# Patient Record
Sex: Male | Born: 1950 | Race: Black or African American | Hispanic: No | Marital: Married | Smoking: Former smoker
Health system: Southern US, Community
[De-identification: ages and names within clinical notes are randomized; demographics above are authoritative.]

## PROBLEM LIST (undated history)

## (undated) DIAGNOSIS — I2510 Atherosclerotic heart disease of native coronary artery without angina pectoris: Secondary | ICD-10-CM

## (undated) DIAGNOSIS — K449 Diaphragmatic hernia without obstruction or gangrene: Secondary | ICD-10-CM

## (undated) DIAGNOSIS — R05 Cough: Secondary | ICD-10-CM

## (undated) DIAGNOSIS — Z86010 Personal history of colonic polyps: Secondary | ICD-10-CM

## (undated) DIAGNOSIS — I428 Other cardiomyopathies: Secondary | ICD-10-CM

## (undated) DIAGNOSIS — I5022 Chronic systolic (congestive) heart failure: Secondary | ICD-10-CM

## (undated) DIAGNOSIS — M1610 Unilateral primary osteoarthritis, unspecified hip: Secondary | ICD-10-CM

## (undated) DIAGNOSIS — R059 Cough, unspecified: Secondary | ICD-10-CM

## (undated) DIAGNOSIS — J189 Pneumonia, unspecified organism: Secondary | ICD-10-CM

## (undated) DIAGNOSIS — M1990 Unspecified osteoarthritis, unspecified site: Secondary | ICD-10-CM

## (undated) HISTORY — DX: Cough, unspecified: R05.9

## (undated) HISTORY — DX: Other cardiomyopathies: I42.8

## (undated) HISTORY — DX: Atherosclerotic heart disease of native coronary artery without angina pectoris: I25.10

## (undated) HISTORY — DX: Diaphragmatic hernia without obstruction or gangrene: K44.9

## (undated) HISTORY — DX: Unilateral primary osteoarthritis, unspecified hip: M16.10

## (undated) HISTORY — DX: Cough: R05

## (undated) HISTORY — DX: Personal history of colonic polyps: Z86.010

## (undated) HISTORY — DX: Unspecified osteoarthritis, unspecified site: M19.90

## (undated) HISTORY — DX: Chronic systolic (congestive) heart failure: I50.22

---

## 2000-05-02 HISTORY — PX: TOTAL HIP ARTHROPLASTY: SHX124

## 2000-09-19 ENCOUNTER — Encounter: Payer: Self-pay | Admitting: Internal Medicine

## 2001-04-01 ENCOUNTER — Encounter: Payer: Self-pay | Admitting: Emergency Medicine

## 2001-04-01 ENCOUNTER — Emergency Department (HOSPITAL_COMMUNITY): Admission: EM | Admit: 2001-04-01 | Discharge: 2001-04-02 | Payer: Self-pay | Admitting: Emergency Medicine

## 2001-04-21 ENCOUNTER — Encounter: Payer: Self-pay | Admitting: Emergency Medicine

## 2001-04-21 ENCOUNTER — Emergency Department (HOSPITAL_COMMUNITY): Admission: EM | Admit: 2001-04-21 | Discharge: 2001-04-22 | Payer: Self-pay | Admitting: Emergency Medicine

## 2002-11-21 ENCOUNTER — Inpatient Hospital Stay (HOSPITAL_COMMUNITY): Admission: EM | Admit: 2002-11-21 | Discharge: 2002-11-23 | Payer: Self-pay | Admitting: Emergency Medicine

## 2002-11-21 ENCOUNTER — Encounter: Payer: Self-pay | Admitting: Emergency Medicine

## 2006-06-10 ENCOUNTER — Ambulatory Visit: Payer: Self-pay | Admitting: Emergency Medicine

## 2006-06-10 ENCOUNTER — Inpatient Hospital Stay (HOSPITAL_COMMUNITY): Admission: EM | Admit: 2006-06-10 | Discharge: 2006-06-15 | Payer: Self-pay | Admitting: Emergency Medicine

## 2006-06-10 ENCOUNTER — Ambulatory Visit: Payer: Self-pay | Admitting: Internal Medicine

## 2006-07-11 ENCOUNTER — Ambulatory Visit: Payer: Self-pay | Admitting: Internal Medicine

## 2006-07-11 ENCOUNTER — Encounter (INDEPENDENT_AMBULATORY_CARE_PROVIDER_SITE_OTHER): Payer: Self-pay | Admitting: Dermatology

## 2006-07-11 DIAGNOSIS — M1990 Unspecified osteoarthritis, unspecified site: Secondary | ICD-10-CM | POA: Insufficient documentation

## 2006-07-11 DIAGNOSIS — M1610 Unilateral primary osteoarthritis, unspecified hip: Secondary | ICD-10-CM | POA: Insufficient documentation

## 2006-07-11 DIAGNOSIS — R05 Cough: Secondary | ICD-10-CM

## 2006-07-11 DIAGNOSIS — K449 Diaphragmatic hernia without obstruction or gangrene: Secondary | ICD-10-CM | POA: Insufficient documentation

## 2006-07-25 ENCOUNTER — Ambulatory Visit: Payer: Self-pay | Admitting: Internal Medicine

## 2010-09-17 NOTE — Discharge Summary (Signed)
NAMETILDON, Marvin Chandler              ACCOUNT NO.:  [id number]   MEDICAL RECORD NO.:  [id number]          PATIENT TYPE:  INP   LOCATION:  5032                         FACILITY:  [facility]   PHYSICIAN:  Marvin Chandler, M.D.     DATE OF BIRTH:  05-08-1950   DATE OF ADMISSION:  06/10/2006  DATE OF DISCHARGE:  06/15/2006                               DISCHARGE SUMMARY   ATTENDING PHYSICIAN:  Marvin Chandler, M.D.   DISCHARGE DIAGNOSES:  1. Status asthmaticus requiring BiPAP.  2. Asthma versus chronic bronchitis.  3. Former tobacco abuse.  4. Steroid-induced hyperglycemia.  5. Steroid-induced leukocytosis.   DISCHARGE MEDICATIONS:  1. Advair 250/50 one puff b.i.d.  2. Prednisone taper.  3. Albuterol inhaler 1-2 puffs q.6 h p.r.n.  4. Singulair 10 mg p.o. daily.  5. Omeprazole 20 mg p.o. daily.  6. Atrovent inhaler 2 puffs 4 times a day.   CONDITION AT DISCHARGE:  Stable and much improved.   The patient was instructed to follow up in the outpatient clinic on a to-  be-determined date as there were no appointments available at the time  that he was discharged.  He will be called with a followup appointment  date and time.  The patient was instructed to test his peak flows every  day and that if it was less than 200, he was to call the outpatient  clinic for instructions.   PROCEDURES:  1. Chest x-ray on June 10, 2006, showed no acute abnormalities.  2. Chest x-ray on June 11, 2006, showed no evidence for acute      abnormality.   CONSULTANTS:  Leslye Peer, MD, critical care medicine    For admission H&P, please see the chart for full details. In summary,  Marvin Chandler is a 60 year old African-American man with a history of  asthma versus chronic bronchitis who presented with a complaint of  severe shortness of breath.  He has a 10-year history of asthma and  has been hospitalized on time for it but has never been intubated.  He  had been coughing for the past week but denied  any other symptoms; for  example, no runny nose, sneezing, myalgias or fevers.  He denied any  sick contacts.  He had been using albuterol metered dose inhaler daily  with increased use over the past week.  He was unable to lift heavy  objects at work for the past 1-2 days prior to admission secondary to  shortness of breath.  He denies any chest pain until after he received  an epinephrine nebulizer treatment in the emergency department, and his  breathing has been very labored for the past 9 hours.  No treatment so  far has helped him until he was placed on BiPAP in the emergency  department.  He had three albuterol nebulizer treatments, prednisone 60  mg x1, an extended albuterol nebulizer treatment, and a racemic  epinephrine nebulizer treatment, all of which had no effect to break his  feeling of shortness of breath.   PHYSICAL EXAMINATION:  Vital signs on admission showed temperature was  97.4, blood pressure 141/84,  pulse 102, respiration rate 28, and he was  saturating at 96% on room air.  Pertinent physical exam findings  included he was audibly wheezing using accessory muscles of respiration  and had conversational dyspnea.  He had adequate air movement with  bilateral inspiratory and expiratory wheezes with upper airway wheezes  as well.  His heart was tachycardiac but regular. Abdomen was tender on  the palpation diffusely.   LABORATORY DATA:  On admission, his sodium was 139, potassium 3.9,  chloride 102, bicarb 27, BUN 9, creatinine 1.01, glucose 146, anion gap  10, bilirubin 0.7, alkaline phosphatase  68, AST 26, ALT 21, protein  7.2, albumin 3.9, calcium 9.2.  UA showed small blood with red blood  cell 0-2, otherwise negative.  Urine drug screen was negative.  White  blood cell count 9.0, hemoglobin 14.4, platelets 274, ANC 8.3, and MCV  87.3.  Lactic acid was elevated at 7.8.  Cardiac markers were negative  on the first set.  ABG initially showed a pH of 7.384, pCO2 of  46, pO2  of 68 on 3 liters. Later an hour later, his pH was 7.321  pCO2 46.3, pO2  134, and that was on 15 liters   HOSPITAL COURSE:  #1.  STATUS ASTHMATICUS:  The patient carries a diagnosis of asthma  for the past 10 years and has had one prior hospitalization for what  appeared to be bronchitis. He is a former tobacco user, having quit  about 11 years ago.  He has not been diagnosed with COPD that he knows  of.  He was, however, taking Atrovent and albuterol at home.  The  patient presented with severe bronchospasm which was initially resistant  to bronchodilators and steroids.  He was transferred to the ICU in  anticipation of intubation for continued repiratory failure but improved  with use of BIPAP under the supervision of Dr. Delton Coombes.  He was treated  empirically with Avelox for presumed pneumonia as the inciting event for  his status asthmaticus.  As he improved, his steroids were weaned,  he  was weaned off of BIPAP to RA, and he was transitiioned to MDIs.  His  medical noncompliance was addressed and he was discharged on steroid  taper, as well as Advair and Singulair for chronic use, an albuterol  inhaler for emergency use, and empiric omeprazole for presumed  contributaion from GERD.  The patient was given an asthma action plan  and instructed that, if his daily peak flows dropped below 200, he  should go to the outpatient clinic or the ER if after hours.  Unfortunately, we were not able to obtain an appointment for the patient  prior to discharge, but he was put on a list to be called with an  appointment and was also given our phone number in case he was not  called in the next week or two.  The patient should probably have a STAT  BMET as well as a CBC to check for resolution of his steroid-induced  leukocytosis and hyperglycemia.  Blood cultures were drawn and were  negative x2 sets. An IgE level was obtained which was very high at 578, indicating that he possibly has an  allergic component of his asthma.  The patient should be referred to an allergy immunologist as an  outpatient as well to perhaps be started on an anti IgE antibody regimen  should his asthma deteriorate and become worse.  A urine culture was  negative. Because he was  tachycardiac after receiving the racemic  epinephrine, a TSH was checked, and it was normal at 0.727.  Cardiac  enzymes were cycled and were negative x3, and his tachycardia did  resolve once the epinephrine washed.   #2.  STEROID-INDUCED HYPERGLYCEMIA:  The patient had transient  hyperglycemia which resolved with cessation of high dose steroids.   #3.  STEROID-INDUCED LEUKOCYTOSIS:  The patient's white blood cell count  became elevated to 17.3 after high-dose intravenous steroids were  initiated, but began to normalize with steroid taper.  No infectious  sources were isolated; and he was discharged home without antibiotics.  In the outpatient setting, a white blood cell count should be obtained  to ensure that it has resolved once the patient is no longer taking  prednisone.  As stated above, his blood cultures were negative x2 sets,  his urine culture was negative, and there was no pneumonia on chest x-  ray.   DISCHARGE LABORATORY DATA AND VITAL SIGNS ON THE DAY OF DISCHARGE:  His  white blood cell count was 12.2, hemoglobin 13.3, hematocrit 40,  platelets 219. Sodium 137, potassium 3.4, chloride 107, bicarb 28,  glucose 89, BUN 13, creatinine 1, calcium 8.1. Temperature 97.3, pulse  77, blood pressure 141/90, and he was saturating 95% on room air.   Pending labs:  None.      Chauncey Reading, D.O.  Electronically Signed      Marvin Chandler, M.D.  Electronically Signed    EA/MEDQ  D:  07/25/2006  T:  07/25/2006  Job:  [id number]   cc:   Leslye Peer, MD

## 2010-09-17 NOTE — Discharge Summary (Signed)
NAME:  Marvin Chandler, Marvin Chandler                        ACCOUNT NO.:  [id number]   MEDICAL RECORD NO.:  [id number]                   PATIENT TYPE:  INP   LOCATION:  0358                                 FACILITY:  [facility]   PHYSICIAN:  Sherin Quarry, MD                   DATE OF BIRTH:  July 04, 1950   DATE OF ADMISSION:  11/21/2002  DATE OF DISCHARGE:                                 DISCHARGE SUMMARY   HISTORY OF PRESENT ILLNESS:  Marvin Chandler is a 60 year old gentleman with  a past history of tobacco abuse who presented to the emergency room on July  22nd with a 48-hour history of increased shortness of breath associated with  a nonproductive cough.  There was no associated chest pain.  He had been  trying to use a metered-dose inhaler as an outpatient with no particular  benefit.  He recognizes the association of his symptoms with cigarette abuse  and has cut down on smoking from two packs a day to 1/2 pack per day.  Chest  x-ray obtained in the emergency room was consistent with chronic obstructive  pulmonary disease but showed no acute infiltrates.  Deirdre Peer. Polite, M.D.  admitted the patient for inpatient management.  A physical exam at the time  of admission revealed blood pressure 151/84.  The pulse was initially 120,  respirations 24, temperature was 98.6.  HEENT exam was within normal limits.  The chest reveals expiratory wheezes which were diffuse.  There was a  prolongation of expiratory phase.  Cardiovascular exam revealed normal S1 &  S2 without murmurs, rubs, or gallops.  The abdomen was benign.  Neurologic  testing and examination of extremities was normal.  Initially, the peak flow  was 120 liters per minute, post therapy was 150 liters per minute.   LABORATORY STUDIES OBTAINED:  Included an arterial blood gas which showed a  PO2 of 59, PCO2 of 39, pH of 7.385.  BMET was notable for a glucose of 160.  White count was 16,800, hemoglobin was 15.1.  Electrolytes and renal  function were normal.  Sputum was obtained which showed a moderate number of  gram-negative rods.  A culture of this is pending.   HOSPITAL COURSE:  On admission, the patient was placed on an IV of Solu-  Medrol 125 mg IV x1 then 60 mg IV every 8 hours.  He was placed on  azithromycin 500 mg daily, nebulizer treatment with albuterol and Atrovent  was initiated every 6 hours and p.r.n., oxygen was administered per oxygen  protocol.  The patient's course was one of gradual improvement.  By July  24th although the patient continued to have some expiratory wheezing, he was  definitely improved and appeared to be a candidate for outpatient therapy.  Therefore, he was discharged at that time.   DISCHARGE DIAGNOSES:  1. Acute exacerbation of chronic bronchitis.  2. 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   DISCHARGE MEDICATIONS:  Will consist of prednisone 60 mg x2, 40 mg daily x3,  30 mg x3, 20 mg x3, 10 mg x3, and then stop.  The patient is instructed to  take Zithromax 500 mg daily for 5 additional days.  He is also given a  prescription for a Combivent inhaler to use with an Aerochamber three puffs  q.i.d. and p.r.n.  I encouraged him to  obtain a nicotine patch.  Both the patient and his wife were instructed to  discontinue cigarette smoking and to remove cigarettes from the house.  He  will follow up with [facility] in about 7 to 10 days.   CONDITION ON DISCHARGE:  Fair.                                               Sherin Quarry, MD    SY/MEDQ  D:  11/23/2002  T:  11/23/2002  Job:  [id number]

## 2011-09-14 ENCOUNTER — Ambulatory Visit (INDEPENDENT_AMBULATORY_CARE_PROVIDER_SITE_OTHER): Payer: BC Managed Care – PPO | Admitting: Physician Assistant

## 2011-09-14 ENCOUNTER — Ambulatory Visit: Payer: BC Managed Care – PPO

## 2011-09-14 VITALS — BP 113/72 | HR 83 | Temp 98.1°F | Resp 18 | Ht 70.0 in | Wt 273.4 lb

## 2011-09-14 DIAGNOSIS — J9801 Acute bronchospasm: Secondary | ICD-10-CM

## 2011-09-14 DIAGNOSIS — R05 Cough: Secondary | ICD-10-CM

## 2011-09-14 DIAGNOSIS — E669 Obesity, unspecified: Secondary | ICD-10-CM

## 2011-09-14 DIAGNOSIS — J45909 Unspecified asthma, uncomplicated: Secondary | ICD-10-CM

## 2011-09-14 LAB — POCT CBC
Granulocyte percent: 56.2 %G (ref 37–80)
HCT, POC: 47 % (ref 43.5–53.7)
Hemoglobin: 15 g/dL (ref 14.1–18.1)
Lymph, poc: 2.6 (ref 0.6–3.4)
MCH, POC: 28.2 pg (ref 27–31.2)
MCHC: 31.9 g/dL (ref 31.8–35.4)
MCV: 88.3 fL (ref 80–97)
MID (cbc): 0.6 (ref 0–0.9)
MPV: 10.4 fL (ref 0–99.8)
POC Granulocyte: 4.2 (ref 2–6.9)
POC LYMPH PERCENT: 35.4 %L (ref 10–50)
POC MID %: 8.4 %M (ref 0–12)
Platelet Count, POC: 257 10*3/uL (ref 142–424)
RBC: 5.32 M/uL (ref 4.69–6.13)
RDW, POC: 13.6 %
WBC: 7.4 10*3/uL (ref 4.6–10.2)

## 2011-09-14 MED ORDER — ALBUTEROL SULFATE HFA 108 (90 BASE) MCG/ACT IN AERS: 2.0000 | INHALATION_SPRAY | Freq: Four times a day (QID) | RESPIRATORY_TRACT | Status: DC | PRN

## 2011-09-14 MED ORDER — METHYLPREDNISOLONE ACETATE 80 MG/ML IJ SUSP
80.0000 mg | Freq: Once | INTRAMUSCULAR | Status: AC
  Administered 2011-09-14: 80 mg via INTRAMUSCULAR

## 2011-09-14 MED ORDER — ALBUTEROL SULFATE (2.5 MG/3ML) 0.083% IN NEBU: 2.5000 mg | INHALATION_SOLUTION | Freq: Four times a day (QID) | RESPIRATORY_TRACT | Status: DC | PRN

## 2011-09-14 MED ORDER — FLUTICASONE-SALMETEROL 250-50 MCG/DOSE IN AEPB: 1.0000 | INHALATION_SPRAY | Freq: Two times a day (BID) | RESPIRATORY_TRACT | Status: DC

## 2011-09-14 MED ORDER — AZITHROMYCIN 250 MG PO TABS: ORAL_TABLET | ORAL | Status: AC

## 2011-09-14 MED ORDER — ALBUTEROL SULFATE (2.5 MG/3ML) 0.083% IN NEBU
2.5000 mg | INHALATION_SOLUTION | Freq: Once | RESPIRATORY_TRACT | Status: AC
  Administered 2011-09-14: 2.5 mg via RESPIRATORY_TRACT

## 2011-09-14 MED ORDER — IPRATROPIUM BROMIDE 0.02 % IN SOLN
0.5000 mg | Freq: Once | RESPIRATORY_TRACT | Status: AC
  Administered 2011-09-14: 0.5 mg via RESPIRATORY_TRACT

## 2011-09-14 NOTE — Patient Instructions (Signed)
Fluids, rest.  Recheck in 48 hrs if not improving, sooner if worse.

## 2011-09-14 NOTE — Progress Notes (Signed)
  Subjective:    Patient ID: Marvin Chandler, male    DOB: 1950-11-10, 61 y.o.   MRN: [id number]  HPI 61 yo AAM new to our practice presents with an asthma exacerbation X 3 days.  He is out of his inhaler and nebulizer solution.  He denies any previous diagnosis of COPD or emphysema. He denies any health problems.  He states he wheezes fairly regularly, but it worsened 3 days ago.   Review of Systems  All other systems reviewed and are negative.       Objective:   Physical Exam  Nursing note and vitals reviewed. Constitutional: He is oriented to person, place, and time. He appears well-developed and well-nourished.  HENT:  Head: Normocephalic and atraumatic.  Right Ear: External ear normal.  Left Ear: External ear normal.  Nose: Nose normal.  Mouth/Throat: Oropharynx is clear and moist. No oropharyngeal exudate.  Neck: Normal range of motion. Neck supple. No JVD present.  Cardiovascular: Normal rate, regular rhythm and normal heart sounds.   Pulmonary/Chest: Effort normal and breath sounds normal.  Lymphadenopathy:    He has no cervical adenopathy.  Neurological: He is alert and oriented to person, place, and time.  Skin: Skin is warm and dry.  Psychiatric: He has a normal mood and affect. His behavior is normal.    Results for orders placed in visit on 09/14/11  POCT CBC      Component Value Range   WBC 7.4  4.6 - 10.2 (K/uL)   Lymph, poc 2.6  0.6 - 3.4    POC LYMPH PERCENT 35.4  10 - 50 (%L)   MID (cbc) 0.6  0 - 0.9    POC MID % 8.4  0 - 12 (%M)   POC Granulocyte 4.2  2 - 6.9    Granulocyte percent 56.2  37 - 80 (%G)   RBC 5.32  4.69 - 6.13 (M/uL)   Hemoglobin 15.0  14.1 - 18.1 (g/dL)   HCT, POC 29.5  62.1 - 53.7 (%)   MCV 88.3  80 - 97 (fL)   MCH, POC 28.2  27 - 31.2 (pg)   MCHC 31.9  31.8 - 35.4 (g/dL)   RDW, POC 30.8     Platelet Count, POC 257  142 - 424 (K/uL)   MPV 10.4  0 - 99.8 (fL)  GLUCOSE, POCT (MANUAL RESULT ENTRY)      Component Value Range   POC  Glucose 106     chest X-ray read by Dr. Milus Glazier as no definite infiltrate.     Assessment & Plan:   Asthmatic bronchitis-see rxs and patient instructions Bronchospasm Patient responded favorably to breathing treatment

## 2011-11-06 ENCOUNTER — Ambulatory Visit (INDEPENDENT_AMBULATORY_CARE_PROVIDER_SITE_OTHER): Payer: BC Managed Care – PPO | Admitting: Emergency Medicine

## 2011-11-06 ENCOUNTER — Ambulatory Visit: Payer: BC Managed Care – PPO

## 2011-11-06 VITALS — BP 132/85 | HR 68 | Temp 98.0°F | Resp 17 | Ht 70.5 in | Wt 223.0 lb

## 2011-11-06 DIAGNOSIS — IMO0002 Reserved for concepts with insufficient information to code with codable children: Secondary | ICD-10-CM

## 2011-11-06 DIAGNOSIS — R079 Chest pain, unspecified: Secondary | ICD-10-CM

## 2011-11-06 DIAGNOSIS — S239XXA Sprain of unspecified parts of thorax, initial encounter: Secondary | ICD-10-CM

## 2011-11-06 DIAGNOSIS — R9431 Abnormal electrocardiogram [ECG] [EKG]: Secondary | ICD-10-CM

## 2011-11-06 DIAGNOSIS — R0789 Other chest pain: Secondary | ICD-10-CM

## 2011-11-06 DIAGNOSIS — M083 Juvenile rheumatoid polyarthritis (seronegative): Secondary | ICD-10-CM

## 2011-11-06 LAB — POCT CBC
Lymph, poc: 2.3 (ref 0.6–3.4)
MCH, POC: 28.7 pg (ref 27–31.2)
MCHC: 31.2 g/dL — AB (ref 31.8–35.4)
MID (cbc): 0.3 (ref 0–0.9)
MPV: 10.2 fL (ref 0–99.8)
POC MID %: 5.3 %M (ref 0–12)
Platelet Count, POC: 237 10*3/uL (ref 142–424)
WBC: 6.4 10*3/uL (ref 4.6–10.2)

## 2011-11-06 LAB — POCT URINALYSIS DIPSTICK
Glucose, UA: NEGATIVE
Leukocytes, UA: NEGATIVE
Nitrite, UA: NEGATIVE
Spec Grav, UA: >=1.03
Urobilinogen, UA: 0.2

## 2011-11-06 LAB — POCT UA - MICROSCOPIC ONLY: Yeast, UA: NEGATIVE

## 2011-11-06 MED ORDER — ALBUTEROL SULFATE HFA 108 (90 BASE) MCG/ACT IN AERS: 2.0000 | INHALATION_SPRAY | Freq: Four times a day (QID) | RESPIRATORY_TRACT | Status: DC | PRN

## 2011-11-06 MED ORDER — FLUTICASONE-SALMETEROL 250-50 MCG/DOSE IN AEPB: 1.0000 | INHALATION_SPRAY | Freq: Two times a day (BID) | RESPIRATORY_TRACT | Status: DC

## 2011-11-06 MED ORDER — MELOXICAM 15 MG PO TABS: ORAL_TABLET | ORAL | Status: DC

## 2011-11-06 MED ORDER — CYCLOBENZAPRINE HCL 10 MG PO TABS: ORAL_TABLET | ORAL | Status: DC

## 2011-11-06 NOTE — Progress Notes (Signed)
Subjective:    Patient ID: Marvin Chandler, male    DOB: [birth year], 61 y.o.   MRN: [id number]  HPI shunt enters with a three-day history of pain in the left side of his chest. 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 He does not remember specific injury to that area. He has not felt particularly short of breath. He also misplaces albuterol inhaler so he called the patient's Symbicort inhaler and has been using this instead. He does have an Advair inhaler at home.    Review of Systems he does have a history of asthma is on Advair and albuterol.     Objective:   Physical Exam  Constitutional: He appears well-developed and well-nourished.  HENT:  Right Ear: External ear normal.  Left Ear: External ear normal.  Eyes: Pupils are equal, round, and reactive to light.  Neck: No thyromegaly present.  Cardiovascular: Normal rate and regular rhythm.   Pulmonary/Chest: Effort normal and breath sounds normal.       There are diminished breath sounds on the left. There are some dry crackles on the left  Abdominal: Soft. He exhibits no distension. There is no tenderness. There is no rebound.   [facility] reading (PRIMARY) by  Dr.Albertina Leise no pneumothorax was seen. There is probable old granulomatous disease.  Results for orders placed in visit on 11/06/11  POCT CBC      Component Value Range   WBC 6.4  4.6 - 10.2 K/uL   Lymph, poc 2.3  0.6 - 3.4   POC LYMPH PERCENT 35.3  10 - 50 %L   MID (cbc) 0.3  0 - 0.9   POC MID % 5.3  0 - 12 %M   POC Granulocyte 3.8  2 - 6.9   Granulocyte percent 59.4  37 - 80 %G   RBC 4.57 (*) 4.69 - 6.13 M/uL   Hemoglobin 13.1 (*) 14.1 - 18.1 g/dL   HCT, POC 13.0 (*) 86.5 - 53.7 %   MCV 91.8  80 - 97 fL   MCH, POC 28.7  27 - 31.2 pg   MCHC 31.2 (*) 31.8 - 35.4 g/dL   RDW, POC 78.4     Platelet Count, POC 237  142 - 424 K/uL   MPV 10.2  0 - 99.8 fL  POCT URINALYSIS DIPSTICK      Component Value Range   Color, UA yellow     Clarity, UA clear     Glucose, UA neg     Bilirubin, UA neg     Ketones, UA neg     Spec Grav, UA >=1.030     Blood, UA trace     pH, UA 6.0     Protein, UA neg     Urobilinogen, UA 0.2     Nitrite, UA neg     Leukocytes, UA Negative    POCT UA - MICROSCOPIC ONLY      Component Value Range   WBC, Ur, HPF, POC 0-2     RBC, urine, microscopic 3-5     Bacteria, U Microscopic trace     Mucus, UA trace     Epithelial cells, urine per micros 1-2     Crystals, Ur, HPF, POC neg     Casts, Ur, LPF, POC neg     Yeast, UA neg     EKG qs V1 to V3      Assessment & Plan:  Patient has been using this and the  cord inhaler instead of his albuterol inhaler. He was given this by friends who is been using it instead. 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 He has no history of any episode of severe substernal discomfort. The pain he is describing today sounds musculoskeletal treat with Mobic 15 one a day. Have scheduled him for a full physical on Friday. We'll also set up an appointment for him to see a cardiologist about his abnormal EKG. His meds for albuterol and Advil were refilled. She will be given Flexeril to take at bedtime

## 2011-11-06 NOTE — Patient Instructions (Addendum)
Please call [phone] and make an appointment to see Dr. Merrilee Seashore Strain You have injured the muscles or tendons that attach to the upper part of your back behind your chest. This injury is called a thoracic strain, thoracic sprain, or mid-back strain.  CAUSES  The cause of thoracic strain varies. A less severe injury involves pulling a muscle or tendon without tearing it. A more severe injury involves tearing (rupturing) a muscle or tendon. With less severe injuries, there may be little loss of strength. Sometimes, there are breaks (fractures) in the bones to which the muscles are attached. These fractures are rare, unless there was a direct hit (trauma) or you have weak bones due to osteoporosis or age. Longstanding strains may be caused by overuse or improper form during certain movements. Obesity can also increase your risk for back injuries. Sudden strains may occur due to injury or not warming up properly before exercise. Often, there is no obvious cause for a thoracic strain. SYMPTOMS  The main symptom is pain, especially with movement, such as during exercise. DIAGNOSIS  Your caregiver can usually tell what is wrong by taking an X-ray and doing a physical exam. TREATMENT   Physical therapy may be helpful for recovery. Your caregiver can give you exercises to do or refer you to a physical therapist after your pain improves.   After your pain improves, strengthening and conditioning programs appropriate for your sport or occupation may be helpful.   Always warm up before physical activities or athletics. Stretching after physical activity may also help.   Certain over-the-counter medicines may also help. Ask your caregiver if there are medicines that would help you.  If this is your first thoracic strain injury, proper care and proper healing time before starting activities should prevent long-term problems. Torn ligaments and tendons require as long to heal as broken bones. Average  healing times may be only 1 week for a mild strain. For torn muscles and tendons, healing time may be up to 6 weeks to 2 months. HOME CARE INSTRUCTIONS   Apply ice to the injured area. Ice massages may also be used as directed.   Put ice in a plastic bag.   Place a towel between your skin and the bag.   Leave the ice on for 15 to 20 minutes, 3 to 4 times a day, for the first 2 days.   Only take over-the-counter or prescription medicines for pain, discomfort, or fever as directed by your caregiver.   Keep your appointments for physical therapy if this was prescribed.   Use wraps and back braces as instructed.  SEEK IMMEDIATE MEDICAL CARE IF:   You have an increase in bruising, swelling, or pain.   Your pain has not improved with medicines.   You develop new shortness of breath, chest pain, or fever.   Problems seem to be getting worse rather than better.  MAKE SURE YOU:   Understand these instructions.   Will watch your condition.   Will get help right away if you are not doing well or get worse.  Document Released: 07/09/2003 Document Revised: 04/07/2011 Document Reviewed: 06/04/2010 [facility] Patient Information 2012 [place], [place]. for a physical on a Friday.

## 2011-11-09 ENCOUNTER — Encounter: Payer: Self-pay | Admitting: Emergency Medicine

## 2011-12-14 ENCOUNTER — Encounter: Payer: Self-pay | Admitting: *Deleted

## 2011-12-16 ENCOUNTER — Encounter: Payer: Self-pay | Admitting: Cardiovascular Disease

## 2011-12-16 ENCOUNTER — Ambulatory Visit (INDEPENDENT_AMBULATORY_CARE_PROVIDER_SITE_OTHER): Payer: Self-pay | Admitting: Cardiovascular Disease

## 2011-12-16 VITALS — BP 152/88 | HR 67 | Ht 71.0 in | Wt 224.0 lb

## 2011-12-16 DIAGNOSIS — R0989 Other specified symptoms and signs involving the circulatory and respiratory systems: Secondary | ICD-10-CM

## 2011-12-16 DIAGNOSIS — R079 Chest pain, unspecified: Secondary | ICD-10-CM | POA: Insufficient documentation

## 2011-12-16 DIAGNOSIS — R0600 Dyspnea, unspecified: Secondary | ICD-10-CM

## 2011-12-16 NOTE — Patient Instructions (Signed)
Your physician recommends that you schedule a follow-up appointment in: 3-4 weeks.   Your physician has requested that you have an exercise tolerance test. For further information please visit [email or web address] Please also follow instruction sheet, as given.   Your physician has requested that you have an echocardiogram. Echocardiography is a painless test that uses sound waves to create images of your heart. It provides your doctor with information about the size and shape of your heart and how well your heart's chambers and valves are working. This procedure takes approximately one hour. There are no restrictions for this procedure.

## 2011-12-16 NOTE — Assessment & Plan Note (Signed)
Will arrange treadmill stress test to exclude ischemia.

## 2011-12-16 NOTE — Assessment & Plan Note (Signed)
Will arrange echo to assess LV function and wall motion with dyspnea and abnormal EKG. Will arrange treadmill stress test to exclude ischemia.

## 2011-12-16 NOTE — Progress Notes (Signed)
   History of Present Illness: 61 yo AAM with history of asthma, hiatal hernia but no HLD, HTN or DM and no prior cardiac disease who is here today as a new patient for evaluation of chest pain and an abnormal EKG. He was seen recently by Dr. Cleta Alberts and his EKG showed poor R wave progression through the precordial leads.   He tells me that he has been having bad pains in his upper back and this radiated to his chest. This did not hurt when he moved his arm. He has had minimal chest pain over the last month. He does notice shortness of breath when he exercise.   Primary Care Physician: Lesle Chris    Past Medical History  Diagnosis Date  . Asthma   . Cough   . Hiatal hernia   . Osteoarthritis   . Arthritis of hip     bilateral    Past Surgical History  Procedure Date  . Total hip arthroplasty 2002    bilateral    Current Outpatient Prescriptions  Medication Sig Dispense Refill  . albuterol (PROVENTIL HFA;VENTOLIN HFA) 108 (90 BASE) MCG/ACT inhaler Inhale 2 puffs into the lungs every 6 (six) hours as needed for wheezing.  1 Inhaler  11  . albuterol (PROVENTIL) (2.5 MG/3ML) 0.083% nebulizer solution Take 3 mLs (2.5 mg total) by nebulization every 6 (six) hours as needed.  75 mL  1  . cyclobenzaprine (FLEXERIL) 10 MG tablet Take one tablet at night as a muscle relaxant.  30 tablet  0  . Fluticasone-Salmeterol (ADVAIR DISKUS) 250-50 MCG/DOSE AEPB Inhale 1 puff into the lungs 2 (two) times daily.  60 each  11  . meloxicam (MOBIC) 15 MG tablet Take one tablet daily with food .  30 tablet  1    No Known Allergies  History   Social History  . Marital Status: Single    Spouse Name: N/A    Number of Children: 2  . Years of Education: N/A   Occupational History  . Softball coach [employer]    Social History Main Topics  . Smoking status: Former Smoker -- 1.0 packs/day for 7 years    Types: Cigarettes    Quit date: 11/05/1997  . Smokeless tobacco: Not on file  . Alcohol  Use: No  . Drug Use: No  . Sexually Active: Not on file   Other Topics Concern  . Not on file   Social History Narrative  . No narrative on file    Family History  Problem Relation Age of Onset  . Dementia Mother     Review of Systems:  As stated in the HPI and otherwise negative.   BP 152/88  Pulse 67  Ht 5\' 11"  (1.803 m)  Wt 224 lb (101.606 kg)  BMI 31.24 kg/m2  Physical Examination: General: Well developed, well nourished, NAD HEENT: OP clear, mucus membranes moist SKIN: warm, dry. No rashes. Neuro: No focal deficits Musculoskeletal: Muscle strength 5/5 all ext Psychiatric: Mood and affect normal Neck: No JVD, no carotid bruits, no thyromegaly, no lymphadenopathy. Lungs:Clear bilaterally, no wheezes, rhonci, crackles Cardiovascular: Regular rate and rhythm. No murmurs, gallops or rubs. Abdomen:Soft. Bowel sounds present. Non-tender.  Extremities: No lower extremity edema. Pulses are 2 + in the bilateral DP/PT.  EKG: Sinus rhythm with 1st degree AV block, poor R wave progression

## 2011-12-22 ENCOUNTER — Ambulatory Visit (HOSPITAL_COMMUNITY): Payer: BC Managed Care – PPO | Attending: Cardiology

## 2011-12-22 DIAGNOSIS — R079 Chest pain, unspecified: Secondary | ICD-10-CM | POA: Insufficient documentation

## 2011-12-22 DIAGNOSIS — R072 Precordial pain: Secondary | ICD-10-CM

## 2011-12-22 DIAGNOSIS — R0609 Other forms of dyspnea: Secondary | ICD-10-CM | POA: Insufficient documentation

## 2011-12-22 DIAGNOSIS — I059 Rheumatic mitral valve disease, unspecified: Secondary | ICD-10-CM | POA: Insufficient documentation

## 2011-12-22 DIAGNOSIS — I379 Nonrheumatic pulmonary valve disorder, unspecified: Secondary | ICD-10-CM | POA: Insufficient documentation

## 2011-12-22 DIAGNOSIS — I079 Rheumatic tricuspid valve disease, unspecified: Secondary | ICD-10-CM | POA: Insufficient documentation

## 2011-12-22 DIAGNOSIS — R0600 Dyspnea, unspecified: Secondary | ICD-10-CM

## 2011-12-22 DIAGNOSIS — R0989 Other specified symptoms and signs involving the circulatory and respiratory systems: Secondary | ICD-10-CM | POA: Insufficient documentation

## 2011-12-22 DIAGNOSIS — Z87891 Personal history of nicotine dependence: Secondary | ICD-10-CM | POA: Insufficient documentation

## 2011-12-22 NOTE — Progress Notes (Signed)
Echocardiogram performed.  

## 2012-01-06 ENCOUNTER — Encounter: Payer: Self-pay | Admitting: Nurse Practitioner

## 2012-01-06 ENCOUNTER — Ambulatory Visit (INDEPENDENT_AMBULATORY_CARE_PROVIDER_SITE_OTHER): Payer: BC Managed Care – PPO | Admitting: Nurse Practitioner

## 2012-01-06 VITALS — BP 134/75 | HR 78

## 2012-01-06 DIAGNOSIS — R0600 Dyspnea, unspecified: Secondary | ICD-10-CM

## 2012-01-06 DIAGNOSIS — R079 Chest pain, unspecified: Secondary | ICD-10-CM

## 2012-01-06 DIAGNOSIS — R9431 Abnormal electrocardiogram [ECG] [EKG]: Secondary | ICD-10-CM

## 2012-01-06 NOTE — Patient Instructions (Addendum)
We need to arrange for a chemical stress test (lexiscan)  Stay on your current medicines  Call the [facility] office at [phone] if you have any questions, problems or concerns.

## 2012-01-06 NOTE — Procedures (Signed)
Exercise Treadmill Test  Pre-Exercise Testing Evaluation Rhythm: normal sinus  Rate: 78   PR:  .20 QRS:  .08  QT:  .36 QTc: .41     Test  Exercise Tolerance Test Ordering MD: Melene Muller, MD  Interpreting MD: Norma Fredrickson , NP  Unique Test No: 1  Treadmill:  1  Indication for ETT: chest pain - rule out ischemia  Contraindication to ETT: No   Stress Modality: exercise - treadmill  Cardiac Imaging Performed: non   Protocol: standard Bruce - maximal  Max BP:  166/63  Max MPHR (bpm):  157 85% MPR (bpm):  133  MPHR obtained (bpm):  122 % MPHR obtained:  77%  Reached 85% MPHR (min:sec):  n/a Total Exercise Time (min-sec):  4:50  Workload in METS:  6.7 Borg Scale: 15  Reason ETT Terminated:  patient's desire to stop    ST Segment Analysis At Rest: normal ST segments - no evidence of significant ST depression With Exercise: no evidence of significant ST depression  Other Information Arrhythmia:  Yes Angina during ETT:  absent (0) Quality of ETT:  non-diagnostic  ETT Interpretation:  Non-diagnostic  Comments: Patient presents today for routine GXT. Seen for Dr. Clifton James. He has no known cardiac disease. Has had an abnormal EKG with poor R wave progression. He has had some chest pain over the past month as well.  Today, he exercised on the standard Bruce protocol. He was only able to exercise for a total of 4:50. The test was stopped due to bilateral hip pain. Target heart rate was not achieved. No chest pain. Resting EKG is abnormal with septal Q waves. No ST depression during the short time he walked. Occasional PVC noted. BP response was adequate.   Recommendations: Will arrange for Lexiscan Myoview in light of patient's inability to reach his target heart rate. He is limited by bilateral hip pain. Further disposition to follow. Patient is agreeable to this plan and will call if any problems develop in the interim.

## 2012-01-16 ENCOUNTER — Ambulatory Visit (HOSPITAL_COMMUNITY): Payer: BC Managed Care – PPO | Attending: Internal Medicine | Admitting: Radiology

## 2012-01-16 VITALS — BP 134/74 | Ht 71.0 in | Wt 222.0 lb

## 2012-01-16 DIAGNOSIS — Z87891 Personal history of nicotine dependence: Secondary | ICD-10-CM | POA: Insufficient documentation

## 2012-01-16 DIAGNOSIS — R9431 Abnormal electrocardiogram [ECG] [EKG]: Secondary | ICD-10-CM

## 2012-01-16 DIAGNOSIS — R079 Chest pain, unspecified: Secondary | ICD-10-CM | POA: Insufficient documentation

## 2012-01-16 DIAGNOSIS — R0609 Other forms of dyspnea: Secondary | ICD-10-CM | POA: Insufficient documentation

## 2012-01-16 DIAGNOSIS — R0600 Dyspnea, unspecified: Secondary | ICD-10-CM

## 2012-01-16 DIAGNOSIS — R0989 Other specified symptoms and signs involving the circulatory and respiratory systems: Secondary | ICD-10-CM | POA: Insufficient documentation

## 2012-01-16 DIAGNOSIS — R0602 Shortness of breath: Secondary | ICD-10-CM | POA: Insufficient documentation

## 2012-01-16 MED ORDER — TECHNETIUM TC 99M SESTAMIBI GENERIC - CARDIOLITE
11.0000 | Freq: Once | INTRAVENOUS | Status: AC | PRN
  Administered 2012-01-16: 11 via INTRAVENOUS

## 2012-01-16 MED ORDER — REGADENOSON 0.4 MG/5ML IV SOLN
0.4000 mg | Freq: Once | INTRAVENOUS | Status: AC
  Administered 2012-01-16: 0.4 mg via INTRAVENOUS

## 2012-01-16 MED ORDER — TECHNETIUM TC 99M SESTAMIBI GENERIC - CARDIOLITE
33.0000 | Freq: Once | INTRAVENOUS | Status: AC | PRN
  Administered 2012-01-16: 33 via INTRAVENOUS

## 2012-01-16 NOTE — Progress Notes (Signed)
[facility] SITE 3 NUCLEAR MED [address] [place] [address] [phone]  Cardiology Nuclear Med Study  Marvin Chandler is a 61 y.o. male     MRN : [id number]     DOB: March 04, 1951  Procedure Date: 01/16/2012  Nuclear Med Background Indication for Stress Test:  Evaluation for Ischemia and Abnormal EKG History:  No prior known history of CAD, '02 Myocardial Perfusion Study-inferior/inferolateral  thinning with soft tissue attenuation, (-) ischemia, EF=46%,12-22-11 Echo: EF=50-55%, and 01-06-12 GXT: Inability to achieve target HR due to hip pain Cardiac Risk Factors: History of Smoking  Symptoms: Chest Pain without exertion (last occurrence end of July),  DOE and SOB   Nuclear Pre-Procedure Caffeine/Decaff Intake:  None NPO After: 6:00am   Lungs:  Diminished breath sounds, no wheezing O2 Sat: 98% on room air. IV 0.9% NS with Angio Cath:  20g  IV Site: R Antecubital  IV Started by:  Stanton Kidney, EMT-P  Chest Size (in):  46 Cup Size: n/a  Height: 5\' 11"  (1.803 m)  Weight:  222 lb (100.699 kg)  BMI:  Body mass index is 30.96 kg/(m^2). Tech Comments:  Proventil Inhaler 2 sprays prior to test per patient.    Nuclear Med Study 1 or 2 day study: 1 day  Stress Test Type:  Treadmill/Lexiscan  Reading MD: Dietrich Pates, MD  Order Authorizing Provider:  Verne Carrow, MD, and Norma Fredrickson, NP  Resting Radionuclide: Technetium 34m Sestamibi  Resting Radionuclide Dose: 11.0 mCi   Stress Radionuclide:  Technetium 40m Sestamibi  Stress Radionuclide Dose: 33.0 mCi           Stress Protocol Rest HR: 72 Stress HR: 101  Rest BP: 134/74 Stress BP: 145/82  Exercise Time (min): 2:00 METS: n/a   Predicted Max HR: 157 bpm % Max HR: 64.33 bpm Rate Pressure Product: 21308   Dose of Adenosine (mg):  n/a Dose of Lexiscan: 0.4 mg  Dose of Atropine (mg): n/a Dose of Dobutamine: n/a mcg/kg/min (at max HR)  Stress Test Technologist: Irean Hong, RN  Nuclear Technologist:   Domenic Polite, CNMT     Rest Procedure:  Myocardial perfusion imaging was performed at rest 45 minutes following the intravenous administration of Technetium 25m Sestamibi. Rest ECG: NSR with 1st AVB, Nonspecific T wave changes  Stress Procedure:  The patient received IV Lexiscan 0.4 mg over 15-seconds with concurrent low level exercise and then Technetium 51m Sestamibi was injected at 30-seconds while the patient continued walking one more minute. There were no significant changes with Lexiscan, (baseline T wave changes). There were occasional PVC's. The patient complained of mild chest pain with Lexiscan. Quantitative spect images were obtained after a 45-minute delay. Stress ECG: No significant change from baseline ECG  QPS Raw Data Images:  Images were motion corrected.  Soft tissue (diaphragm, bowel activity, subcutaneous fat) surround heart. Stress Images:  Thinning with decreased tracer activity in the infeior wall (base). Minimal apical thinning  Otherwise normal perfusion. Rest Images:  Comparison with the stress images reveals no significant change. Subtraction (SDS):  No evidence of ischemia. Transient Ischemic Dilatation (Normal <1.22):  0.99 Lung/Heart Ratio (Normal <0.45):  0.29  Quantitative Gated Spect Images QGS EDV:  155 ml QGS ESV:  85 ml  Impression Exercise Capacity:  Lexiscan with low level exercise. BP Response:  Normal blood pressure response. Clinical Symptoms:  Mild chest pain/dyspnea. ECG Impression:  No significant ST segment change suggestive of ischemia. Comparison with Prior Nuclear Study: No change from  previous report.  Overall Impression:  Probable normal perfusion and minimal soft tissue attenuation.  No ischemia.  LV Ejection Fraction: 43%.  LV Wall Motion:  LVEF appears better than calculated 43%.  Consider echo to evaluate.

## 2012-01-18 ENCOUNTER — Other Ambulatory Visit (HOSPITAL_COMMUNITY): Payer: BC Managed Care – PPO

## 2012-01-18 ENCOUNTER — Encounter: Payer: Self-pay | Admitting: Cardiovascular Disease

## 2012-01-18 ENCOUNTER — Ambulatory Visit (INDEPENDENT_AMBULATORY_CARE_PROVIDER_SITE_OTHER): Payer: BC Managed Care – PPO | Admitting: Cardiovascular Disease

## 2012-01-18 ENCOUNTER — Other Ambulatory Visit: Payer: Self-pay | Admitting: *Deleted

## 2012-01-18 VITALS — BP 137/81 | HR 81 | Ht 71.0 in | Wt 219.0 lb

## 2012-01-18 DIAGNOSIS — R0989 Other specified symptoms and signs involving the circulatory and respiratory systems: Secondary | ICD-10-CM

## 2012-01-18 DIAGNOSIS — R079 Chest pain, unspecified: Secondary | ICD-10-CM

## 2012-01-18 MED ORDER — ASPIRIN EC 81 MG PO TBEC: 81.0000 mg | DELAYED_RELEASE_TABLET | Freq: Every day | ORAL | Status: DC

## 2012-01-18 NOTE — Progress Notes (Signed)
History of Present Illness: 61 yo AAM with history of asthma, hiatal hernia but no HLD, HTN or DM and no prior cardiac disease who is here today for cardiac follow up. I saw him August 2013 as a new patient for evaluation of chest pain and an abnormal EKG. He was seen recently by Dr. Cleta Alberts and his EKG showed poor R wave progression through the precordial leads. He told me that he has been having bad pains in his upper back and this radiated to his chest. This did not hurt when he moved his arm. He has had minimal chest pain over the last month. He does notice shortness of breath when he exercise. I arranged an exercise stress myoview that showed no ischemia. His LVEF was calculated at 43% but appeared better than that. Echo August 2013 with normal LVEF 50-55%, mild LVH.   He is feeling well. No chest pain. He still has some SOB with exertion.   Primary Care Physician: Lesle Chris    Past Medical History  Diagnosis Date  . Asthma   . Cough   . Hiatal hernia   . Osteoarthritis   . Arthritis of hip     bilateral    Past Surgical History  Procedure Date  . Total hip arthroplasty 2002    bilateral    Current Outpatient Prescriptions  Medication Sig Dispense Refill  . albuterol (PROVENTIL HFA;VENTOLIN HFA) 108 (90 BASE) MCG/ACT inhaler Inhale 2 puffs into the lungs every 6 (six) hours as needed for wheezing.  1 Inhaler  11  . albuterol (PROVENTIL) (2.5 MG/3ML) 0.083% nebulizer solution Take 3 mLs (2.5 mg total) by nebulization every 6 (six) hours as needed.  75 mL  1  . cyclobenzaprine (FLEXERIL) 10 MG tablet Take one tablet at night as a muscle relaxant.  30 tablet  0  . Fluticasone-Salmeterol (ADVAIR DISKUS) 250-50 MCG/DOSE AEPB Inhale 1 puff into the lungs 2 (two) times daily.  60 each  11  . meloxicam (MOBIC) 15 MG tablet Take one tablet daily with food .  30 tablet  1    No Known Allergies  History   Social History  . Marital Status: Single    Spouse Name: N/A    Number of  Children: 2  . Years of Education: N/A   Occupational History  . Softball coach [employer]    Social History Main Topics  . Smoking status: Former Smoker -- 1.0 packs/day for 7 years    Types: Cigarettes    Quit date: 11/05/1997  . Smokeless tobacco: Not on file  . Alcohol Use: No  . Drug Use: No  . Sexually Active: Not on file   Other Topics Concern  . Not on file   Social History Narrative  . No narrative on file    Family History  Problem Relation Age of Onset  . Dementia Mother     Review of Systems:  As stated in the HPI and otherwise negative.   BP 137/81  Pulse 81  Ht 5\' 11"  (1.803 m)  Wt 219 lb (99.338 kg)  BMI 30.54 kg/m2  Physical Examination: General: Well developed, well nourished, NAD HEENT: OP clear, mucus membranes moist SKIN: warm, dry. No rashes. Neuro: No focal deficits Musculoskeletal: Muscle strength 5/5 all ext Psychiatric: Mood and affect normal Neck: No JVD, no carotid bruits, no thyromegaly, no lymphadenopathy. Lungs:Clear bilaterally, no wheezes, rhonci, crackles Cardiovascular: Regular rate and rhythm. No murmurs, gallops or rubs. Abdomen:Soft. Bowel sounds present.  Non-tender.  Extremities: No lower extremity edema. Pulses are 2 + in the bilateral DP/PT.  Stress myoview 01/16/12: Stress Procedure: The patient received IV Lexiscan 0.4 mg over 15-seconds with concurrent low level exercise and then Technetium 75m Sestamibi was injected at 30-seconds while the patient continued walking one more minute. There were no significant changes with Lexiscan, (baseline T wave changes). There were occasional PVC's. The patient complained of mild chest pain with Lexiscan. Quantitative spect images were obtained after a 45-minute delay.  Stress ECG: No significant change from baseline ECG  QPS  Raw Data Images: Images were motion corrected. Soft tissue (diaphragm, bowel activity, subcutaneous fat) surround heart.  Stress Images: Thinning with  decreased tracer activity in the infeior wall (base). Minimal apical thinning Otherwise normal perfusion.  Rest Images: Comparison with the stress images reveals no significant change.  Subtraction (SDS): No evidence of ischemia.  Transient Ischemic Dilatation (Normal <1.22): 0.99  Lung/Heart Ratio (Normal <0.45): 0.29  Quantitative Gated Spect Images  QGS EDV: 155 ml  QGS ESV: 85 ml  Impression  Exercise Capacity: Lexiscan with low level exercise.  BP Response: Normal blood pressure response.  Clinical Symptoms: Mild chest pain/dyspnea.  ECG Impression: No significant ST segment change suggestive of ischemia.  Comparison with Prior Nuclear Study: No change from previous report.  Overall Impression: Probable normal perfusion and minimal soft tissue attenuation. No ischemia.  LV Ejection Fraction: 43%. LV Wall Motion: LVEF appears better than calculated 43%. Consider echo to evaluate.  Echo 12/22/11: Left ventricle: The cavity size was normal. Wall thickness was increased in a pattern of mild LVH. Systolic function was normal. The estimated ejection fraction was in the range of 50% to 55%. Wall motion was normal; there were no regional wall motion abnormalities. Features are consistent with a pseudonormal left ventricular filling pattern, with concomitant abnormal relaxation and increased filling pressure (grade 2 diastolic dysfunction). - Left atrium: The atrium was mildly dilated.  Assessment and Plan:   1. Chest pain: Stress test without ischemia. Echo with mild LVH and normal LV function. No further cardiac workup at this time. His chest pain appears non-cardiac. Will start ASA 81 mg po Qdaily. He will call if his clinical status changes.

## 2012-01-18 NOTE — Patient Instructions (Addendum)
Your physician wants you to follow-up in:  12 months. You will receive a reminder letter in the mail two months in advance. If you don't receive a letter, please call our office to schedule the follow-up appointment.  Your physician has recommended you make the following change in your medication:  Start aspirin 81 mg by mouth daily  

## 2012-09-14 ENCOUNTER — Ambulatory Visit: Payer: BC Managed Care – PPO

## 2012-09-14 ENCOUNTER — Ambulatory Visit (INDEPENDENT_AMBULATORY_CARE_PROVIDER_SITE_OTHER): Payer: BC Managed Care – PPO | Admitting: Emergency Medicine

## 2012-09-14 VITALS — BP 150/86 | HR 66 | Temp 98.6°F | Resp 16 | Ht 70.25 in | Wt 234.6 lb

## 2012-09-14 DIAGNOSIS — M25559 Pain in unspecified hip: Secondary | ICD-10-CM

## 2012-09-14 DIAGNOSIS — M549 Dorsalgia, unspecified: Secondary | ICD-10-CM

## 2012-09-14 DIAGNOSIS — M25552 Pain in left hip: Secondary | ICD-10-CM

## 2012-09-14 MED ORDER — DICLOFENAC SODIUM 75 MG PO TBEC: 75.0000 mg | DELAYED_RELEASE_TABLET | Freq: Two times a day (BID) | ORAL | Status: DC

## 2012-09-14 NOTE — Patient Instructions (Addendum)
Back Pain, Adult Low back pain is very common. About 1 in 5 people have back pain.The cause of low back pain is rarely dangerous. The pain often gets better over time.About half of people with a sudden onset of back pain feel better in just 2 weeks. About 8 in 10 people feel better by 6 weeks.  CAUSES Some common causes of back pain include:  Strain of the muscles or ligaments supporting the spine.  Wear and tear (degeneration) of the spinal discs.  Arthritis.  Direct injury to the back. DIAGNOSIS Most of the time, the direct cause of low back pain is not known.However, back pain can be treated effectively even when the exact cause of the pain is unknown.Answering your caregiver's questions about your overall health and symptoms is one of the most accurate ways to make sure the cause of your pain is not dangerous. If your caregiver needs more information, he or she may order lab work or imaging tests (X-rays or MRIs).However, even if imaging tests show changes in your back, this usually does not require surgery. HOME CARE INSTRUCTIONS For many people, back pain returns.Since low back pain is rarely dangerous, it is often a condition that people can learn to manageon their own.   Remain active. It is stressful on the back to sit or stand in one place. Do not sit, drive, or stand in one place for more than 30 minutes at a time. Take short walks on level surfaces as soon as pain allows.Try to increase the length of time you walk each day.  Do not stay in bed.Resting more than 1 or 2 days can delay your recovery.  Do not avoid exercise or work.Your body is made to move.It is not dangerous to be active, even though your back may hurt.Your back will likely heal faster if you return to being active before your pain is gone.  Pay attention to your body when you bend and lift. Many people have less discomfortwhen lifting if they bend their knees, keep the load close to their bodies,and  avoid twisting. Often, the most comfortable positions are those that put less stress on your recovering back.  Find a comfortable position to sleep. Use a firm mattress and lie on your side with your knees slightly bent. If you lie on your back, put a pillow under your knees.  Only take over-the-counter or prescription medicines as directed by your caregiver. Over-the-counter medicines to reduce pain and inflammation are often the most helpful.Your caregiver may prescribe muscle relaxant drugs.These medicines help dull your pain so you can more quickly return to your normal activities and healthy exercise.  Put ice on the injured area.  Put ice in a plastic bag.  Place a towel between your skin and the bag.  Leave the ice on for 15 to 20 minutes, 3 to 4 times a day for the first 2 to 3 days. After that, ice and heat may be alternated to reduce pain and spasms.  Ask your caregiver about trying back exercises and gentle massage. This may be of some benefit.  Avoid feeling anxious or stressed.Stress increases muscle tension and can worsen back pain.It is important to recognize when you are anxious or stressed and learn ways to manage it.Exercise is a great option. SEEK MEDICAL CARE IF:  You have pain that is not relieved with rest or medicine.  You have pain that does not improve in 1 week.  You have new symptoms.  You are generally   not feeling well. SEEK IMMEDIATE MEDICAL CARE IF:   You have pain that radiates from your back into your legs.  You develop new bowel or bladder control problems.  You have unusual weakness or numbness in your arms or legs.  You develop nausea or vomiting.  You develop abdominal pain.  You feel faint. Document Released: 04/18/2005 Document Revised: 10/18/2011 Document Reviewed: 09/06/2010 ExitCare Patient Information 2013 [employer].  

## 2012-09-14 NOTE — Progress Notes (Signed)
[facility] [address] [place] [address] [phone]  Date:  09/14/2012   Name:  Marvin Chandler   DOB:  1950/08/09   MRN:  [id number]  PCP:  DUGUAY,VERONIQUE    Chief Complaint: Back Pain and Hip Pain   History of Present Illness:  Marvin Chandler is a 62 y.o. very pleasant male patient who presents with the following:  15 years post bilateral hip replacement. 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  Has developed progressively worsening pain in his low back and left hip. Not radiating.  No neuro symptoms.  No weakness.  Denies discrete injury.  No recent health maintenance; overdue for labs and colonoscopy.  No improvement with over the counter medications or other home remedies. Denies other complaint or health concern today.   Patient Active Problem List   Diagnosis Date Noted  . Dyspnea 12/16/2011  . Chest pain 12/16/2011  . ASTHMA 07/11/2006  . ASTHMA NOS W/STATUS ASTHMATICUS 07/11/2006  . HIATAL HERNIA 07/11/2006  . OSTEOARTHRITIS 07/11/2006  . ARTHRITIS, HIPS, BILATERAL 07/11/2006  . SYMPTOM, COUGH 07/11/2006    Past Medical History  Diagnosis Date  . Asthma   . Cough   . Hiatal hernia   . Osteoarthritis   . Arthritis of hip     bilateral    Past Surgical History  Procedure Laterality Date  . Total hip arthroplasty  2002    bilateral    History  Substance Use Topics  . Smoking status: Former Smoker -- 1.00 packs/day for 7 years    Types: Cigarettes    Quit date: 11/05/1997  . Smokeless tobacco: Not on file  . Alcohol Use: No    Family History  Problem Relation Age of Onset  . Dementia Mother     No Known Allergies  Medication list has been reviewed and updated.  Current Outpatient Prescriptions on File Prior to Visit  Medication Sig Dispense Refill  . aspirin EC 81 MG tablet Take 1 tablet (81 mg total) by mouth daily.  30 tablet  3  . cyclobenzaprine (FLEXERIL) 10 MG tablet Take  one tablet at night as a muscle relaxant.  30 tablet  0  . Fluticasone-Salmeterol (ADVAIR DISKUS) 250-50 MCG/DOSE AEPB Inhale 1 puff into the lungs 2 (two) times daily.  60 each  11  . albuterol (PROVENTIL HFA;VENTOLIN HFA) 108 (90 BASE) MCG/ACT inhaler Inhale 2 puffs into the lungs every 6 (six) hours as needed for wheezing.  1 Inhaler  11  . albuterol (PROVENTIL) (2.5 MG/3ML) 0.083% nebulizer solution Take 3 mLs (2.5 mg total) by nebulization every 6 (six) hours as needed.  75 mL  1   No current facility-administered medications on file prior to visit.    Review of Systems:  As per HPI, otherwise negative.    Physical Examination: Filed Vitals:   09/14/12 1257  BP: 150/86  Pulse: 66  Temp: 98.6 F (37 C)  Resp: 16   Filed Vitals:   09/14/12 1257  Height: 5' 10.25" (1.784 m)  Weight: 234 lb 9.6 oz (106.414 kg)   Body mass index is 33.44 kg/(m^2). Ideal Body Weight: Weight in (lb) to have BMI = 25: 175.1   GEN: WDWN, NAD, Non-toxic, Alert & Oriented x 3 HEENT: Atraumatic, Normocephalic.  Ears and Nose: No external deformity. EXTR: No clubbing/cyanosis/edema NEURO: Normal gait.  PSYCH: Normally interactive. Conversant. Not depressed or anxious appearing.  Calm demeanor.  BACK:  Tender lumbar spine.  Neuro intact Left hip:  No swelling, ecchymosis or erythema  Assessment and Plan: Low back pain History of hip replacements Ortho referral for hip voltaren  Signed,  Phillips Odor, MD  [facility] reading (PRIMARY) by  Dr. Dareen Piano.  LS Spine;  No osseous injury.  [facility] reading (PRIMARY) by  Dr. Dareen Piano.  Metal .

## 2012-11-19 ENCOUNTER — Telehealth: Payer: Self-pay

## 2012-11-19 DIAGNOSIS — M25552 Pain in left hip: Secondary | ICD-10-CM

## 2012-11-19 DIAGNOSIS — Z1211 Encounter for screening for malignant neoplasm of colon: Secondary | ICD-10-CM

## 2012-11-19 NOTE — Telephone Encounter (Signed)
Pt is needing to know about his ortho referral,  I had looked in the referrals and it has do not schedule so we were wondering if there could be another referral in and also wondering about colonoscopy refferal as well The pt is off the august 2nd if it could be scheduled. Call back number is [phone]

## 2012-11-21 NOTE — Telephone Encounter (Signed)
Got disconnected with pt.  It looks like pt did not return call to schedule appt for ortho.  We can start another ortho referral and we can see about getting a colonoscopy referral as well.

## 2012-11-22 NOTE — Telephone Encounter (Signed)
Referral made 

## 2012-11-22 NOTE — Telephone Encounter (Signed)
Referral made for ortho, for left hip pain. Dr Dareen Piano had previously made this referral. Do you want to refer for the colonoscopy. We have never seen patient for physical. Acute visits only.

## 2012-11-22 NOTE — Telephone Encounter (Signed)
sure

## 2012-11-23 ENCOUNTER — Encounter: Payer: Self-pay | Admitting: Internal Medicine

## 2013-01-14 ENCOUNTER — Other Ambulatory Visit: Payer: Self-pay | Admitting: Emergency Medicine

## 2013-01-16 NOTE — Telephone Encounter (Signed)
Needs OV.  

## 2013-01-17 ENCOUNTER — Other Ambulatory Visit: Payer: Self-pay | Admitting: Emergency Medicine

## 2013-01-21 ENCOUNTER — Ambulatory Visit: Payer: BC Managed Care – PPO

## 2013-01-21 ENCOUNTER — Ambulatory Visit (INDEPENDENT_AMBULATORY_CARE_PROVIDER_SITE_OTHER): Payer: BC Managed Care – PPO | Admitting: Family Medicine

## 2013-01-21 VITALS — BP 118/70 | HR 69 | Temp 98.0°F | Resp 16 | Ht 70.0 in | Wt 222.0 lb

## 2013-01-21 DIAGNOSIS — S46911A Strain of unspecified muscle, fascia and tendon at shoulder and upper arm level, right arm, initial encounter: Secondary | ICD-10-CM

## 2013-01-21 DIAGNOSIS — M25511 Pain in right shoulder: Secondary | ICD-10-CM

## 2013-01-21 DIAGNOSIS — M25519 Pain in unspecified shoulder: Secondary | ICD-10-CM

## 2013-01-21 MED ORDER — DICLOFENAC SODIUM 75 MG PO TBEC: 75.0000 mg | DELAYED_RELEASE_TABLET | Freq: Two times a day (BID) | ORAL | Status: DC

## 2013-01-21 NOTE — Patient Instructions (Addendum)
Anti-inflammatory medication  Rest shoulder with sling  May work light duty  Return if worse or if not improved by this Friday. I will be here from 7:45 AM to 5 PM.

## 2013-01-21 NOTE — Progress Notes (Signed)
Subjective: 62 year old man who has for sometime been having some shoulder pain but over the last week or so is constantly worse. It hurts around her shoulder girdle. 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 He gets some pains stretch his arm a certain way it shoots down into his right fourth and fifth finger of some to all of her fingers he says. He has not taken any medications or had any prior treatments for this.  He does have a history of DJD of his hips and his see an orthopedist or of the 62 year old old hip prostheses that he has been day it is probably starting to give him some troubles.  Objective: Radial pulse is good grip good sensation good in his hands. Range of motion of the shoulders somewhat limited. He has limited abduction, cannot raise his arm above 90. He points to the anterior part of the shoulder biceps region and the posterior part of the shoulder just above the scapula is being the places of primary pain. He is not tender in the bursal area of the subacromion. Neck has full range of motion though he says it's a little stiff. Is not tender in the trapezius or upper shoulder muscles.  Assessment: Shoulder pain with some right shoulder and hand radiculopathy  Plan:  X-ray of shoulder  [facility] reading (PRIMARY) by  Dr. Alwyn Ren Normal shoulder  Anti-inflammatory medication  Rest shoulder with sling  May work light duty  Return if worse or if not improved by this Friday. I will be here from 7:45 AM to 5 PM.

## 2013-01-25 ENCOUNTER — Telehealth: Payer: Self-pay

## 2013-01-25 NOTE — Telephone Encounter (Signed)
Patient wants to know the next step in his treatment.  Does he need to come back in?  Has the doctor determined anything?    [phone]

## 2013-01-27 NOTE — Telephone Encounter (Signed)
Call:  I had put in his discharge instructions if not better by Friday to come back in, and it looks like he called back late Friday.  If continuing to have problems he needs to be looked at again and decide what to do next.

## 2013-01-27 NOTE — Telephone Encounter (Signed)
Spoke with pt advised to RTC. Pt will return tomorrow.

## 2013-01-28 ENCOUNTER — Ambulatory Visit (INDEPENDENT_AMBULATORY_CARE_PROVIDER_SITE_OTHER): Payer: BC Managed Care – PPO | Admitting: Family Medicine

## 2013-01-28 VITALS — BP 134/80 | HR 71 | Temp 98.7°F | Resp 16 | Ht 70.0 in | Wt 234.0 lb

## 2013-01-28 DIAGNOSIS — J45909 Unspecified asthma, uncomplicated: Secondary | ICD-10-CM

## 2013-01-28 DIAGNOSIS — M25519 Pain in unspecified shoulder: Secondary | ICD-10-CM

## 2013-01-28 DIAGNOSIS — M25511 Pain in right shoulder: Secondary | ICD-10-CM

## 2013-01-28 MED ORDER — ALBUTEROL SULFATE HFA 108 (90 BASE) MCG/ACT IN AERS: 2.0000 | INHALATION_SPRAY | Freq: Four times a day (QID) | RESPIRATORY_TRACT | Status: DC | PRN

## 2013-01-28 MED ORDER — METHYLPREDNISOLONE ACETATE 80 MG/ML IJ SUSP
80.0000 mg | Freq: Once | INTRAMUSCULAR | Status: AC
  Administered 2013-01-28: 80 mg via INTRAMUSCULAR

## 2013-01-28 MED ORDER — FLUTICASONE-SALMETEROL 250-50 MCG/DOSE IN AEPB: 1.0000 | INHALATION_SPRAY | Freq: Every day | RESPIRATORY_TRACT | Status: DC

## 2013-01-28 NOTE — Progress Notes (Signed)
Subjective: Despite resting his shoulder and taking medication over the past week the current right shoulder continues to hurt intensely with any movement. There is pain anteriorly as well as posteriorly. It hurts to move the lower arm, with most of the pain in the upper arm though a little bit down toward the elbow. Knows of no specific injury that started this all. He has been taking his diclofenac.  Objective: Very tender shoulder girdle on the right side. Any movement of it hurts. Strength of right hand is adequate with neurovascular intact.  Patient also requests a refill on his asthma medication  Assessment: Right shoulder pain with probable arthritic inflammation Asthma  Plan: Talk to her about referral or going in trying an injection first, and decided on the injection.  Procedure: Using sterile technique the right shoulder was injected with a posterior approach with 40 mg of Depo-Medrol (one half of 80(and 1 cc of 2% lidocaine. Patient tolerated the procedure well. Some improvement was received from the lidocaine itself. We will see how the anti-inflammatory effect as. He is instructed on care of the shoulder.  If he is not doing better by later this week we will go ahead and start the process of making an orthopedic referral for him

## 2013-01-28 NOTE — Patient Instructions (Addendum)
Use ice on the shoulder about 4 or 5 times a day for the next couple of days. Take 2 days off from work and try and rest it. If it is not feeling substantially better by Thursday or Friday call me and we will start the process of getting him with an orthopedic doctor. Continue taking your other anti-inflammatory pain medication.

## 2013-01-30 ENCOUNTER — Encounter: Payer: Self-pay | Admitting: Internal Medicine

## 2013-01-30 ENCOUNTER — Ambulatory Visit (AMBULATORY_SURGERY_CENTER): Payer: Self-pay | Admitting: *Deleted

## 2013-01-30 VITALS — Ht 71.0 in | Wt 230.4 lb

## 2013-01-30 DIAGNOSIS — Z1211 Encounter for screening for malignant neoplasm of colon: Secondary | ICD-10-CM

## 2013-01-30 MED ORDER — NA SULFATE-K SULFATE-MG SULF 17.5-3.13-1.6 GM/177ML PO SOLN: 1.0000 | Freq: Once | ORAL | Status: DC

## 2013-01-30 NOTE — Progress Notes (Signed)
No egg or soy allergy. No anesthesia problems.  

## 2013-02-13 ENCOUNTER — Ambulatory Visit (AMBULATORY_SURGERY_CENTER): Payer: BC Managed Care – PPO | Admitting: Internal Medicine

## 2013-02-13 ENCOUNTER — Encounter: Payer: Self-pay | Admitting: Internal Medicine

## 2013-02-13 ENCOUNTER — Telehealth: Payer: Self-pay

## 2013-02-13 VITALS — BP 123/76 | HR 61 | Temp 97.1°F | Resp 16 | Ht 71.0 in | Wt 230.0 lb

## 2013-02-13 DIAGNOSIS — Z1211 Encounter for screening for malignant neoplasm of colon: Secondary | ICD-10-CM

## 2013-02-13 DIAGNOSIS — K648 Other hemorrhoids: Secondary | ICD-10-CM

## 2013-02-13 DIAGNOSIS — D126 Benign neoplasm of colon, unspecified: Secondary | ICD-10-CM

## 2013-02-13 HISTORY — PX: COLONOSCOPY: SHX174

## 2013-02-13 MED ORDER — SODIUM CHLORIDE 0.9 % IV SOLN: 500.0000 mL | INTRAVENOUS | Status: DC

## 2013-02-13 NOTE — Op Note (Signed)
[facility] [address] [place], [address]   COLONOSCOPY PROCEDURE REPORT  PATIENT: Marvin Chandler, Marvin Chandler  MR#: [id number] BIRTHDATE: 1950-06-25 , 62  yrs. old GENDER: Male ENDOSCOPIST: Iva Boop, MD, [facility] REFERRED [id number] Lorel Monaco, M.D. PROCEDURE DATE:  02/13/2013 PROCEDURE:   Colonoscopy with snare polypectomy First Screening Colonoscopy - Avg.  risk and is 50 yrs.  old or older Yes.  Prior Negative Screening - Now for repeat screening. N/A  History of Adenoma - Now for follow-up colonoscopy & has been > or = to 3 yrs.  N/A  Polyps Removed Today? Yes. ASA CLASS:   Class II INDICATIONS:average risk screening and first colonoscopy. MEDICATIONS: propofol (Diprivan) 250mg  IV, MAC sedation, administered by CRNA, and These medications were titrated to patient response per physician's verbal order  DESCRIPTION OF PROCEDURE:   After the risks benefits and alternatives of the procedure were thoroughly explained, informed consent was obtained.  A digital rectal exam revealed no abnormalities of the rectum, A digital rectal exam revealed no prostatic nodules, and A digital rectal exam revealed the prostate was not enlarged.   The LB [id number]  endoscope was introduced through the anus and advanced to the cecum, which was identified by both the appendix and ileocecal valve. No adverse events experienced.   The quality of the prep was excellent using Suprep  The instrument was then slowly withdrawn as the colon was fully examined.   COLON FINDINGS: Two sessile polyps measuring 4 and 7 mm in size were found in the ascending colon and transverse colon.  A polypectomy was performed with a cold snare.  The resection was complete and the polyp tissue was completely retrieved.   The colon mucosa was otherwise normal.   A right colon retroflexion was performed. Retroflexed views revealed internal hemorrhoids. The time to cecum=2 minutes 32 seconds.  Withdrawal  time=8 minutes 07 seconds. The scope was withdrawn and the procedure completed. COMPLICATIONS: There were no complications.  ENDOSCOPIC IMPRESSION: 1.   Two sessile polyps measuring 4 and 7 mm in size were found in the ascending colon and transverse colon; polypectomy was performed with a cold snare 2.   The colon mucosa was otherwise normal - excellent prep - first colonoscopy 3.   Internal hemorrhoids  RECOMMENDATIONS: Timing of repeat colonoscopy will be determined by pathology findings.  eSigned:  Iva Boop, MD, [facility] 02/13/2013 9:36 AM   cc: Dellia Beckwith, MD and The Patient

## 2013-02-13 NOTE — Progress Notes (Signed)
Called to room to assist during endoscopic procedure.  Patient ID and intended procedure confirmed with present staff. Received instructions for my participation in the procedure from the performing physician.  

## 2013-02-13 NOTE — Patient Instructions (Addendum)
I found and removed 2 polyps today. You also have hemorrhoids.  I will let you know pathology results and when to have another routine colonoscopy by mail.  If you have hemorrhoid problems (swelling, itching, bleeding) I am able to treat those with an in-office procedure. If you like, please call my office at [phone] to schedule an appointment and I can evaluate you further.  It is the time of year to have a vaccination to prevent the flu (influenza virus). Please have this done through your primary care provider or you can get this done at local pharmacies or the [facility]. It would be very helpful if you notify your primary care provider when and where you had the vaccination given by messaging them in My Chart, leaving a message or faxing the information.  I appreciate the opportunity to care for you. Iva Boop, MD, [facility]  Discharge instructions given. Handouts on polyps and hemorrhoids. Resume previous medications. YOU HAD AN ENDOSCOPIC PROCEDURE TODAY AT THE [facility]: Refer to the procedure report that was given to you for any specific questions about what was found during the examination.  If the procedure report does not answer your questions, please call your gastroenterologist to clarify.  If you requested that your care partner not be given the details of your procedure findings, then the procedure report has been included in a sealed envelope for you to review at your convenience later.  YOU SHOULD EXPECT: Some feelings of bloating in the abdomen. Passage of more gas than usual.  Walking can help get rid of the air that was put into your GI tract during the procedure and reduce the bloating. If you had a lower endoscopy (such as a colonoscopy or flexible sigmoidoscopy) you may notice spotting of blood in your stool or on the toilet paper. If you underwent a bowel prep for your procedure, then you may not have a normal bowel movement for a few days.  DIET: Your  first meal following the procedure should be a light meal and then it is ok to progress to your normal diet.  A half-sandwich or bowl of soup is an example of a good first meal.  Heavy or fried foods are harder to digest and may make you feel nauseous or bloated.  Likewise meals heavy in dairy and vegetables can cause extra gas to form and this can also increase the bloating.  Drink plenty of fluids but you should avoid alcoholic beverages for 24 hours.  ACTIVITY: Your care partner should take you home directly after the procedure.  You should plan to take it easy, moving slowly for the rest of the day.  You can resume normal activity the day after the procedure however you should NOT DRIVE or use heavy machinery for 24 hours (because of the sedation medicines used during the test).    SYMPTOMS TO REPORT IMMEDIATELY: A gastroenterologist can be reached at any hour.  During normal business hours, 8:30 AM to 5:00 PM Monday through Friday, call [phone].  After hours and on weekends, please call the GI answering service at [phone] who will take a message and have the physician on call contact you.   Following lower endoscopy (colonoscopy or flexible sigmoidoscopy):  Excessive amounts of blood in the stool  Significant tenderness or worsening of abdominal pains  Swelling of the abdomen that is new, acute  Fever of 100F or higher  FOLLOW UP: If any biopsies were taken you will be  contacted by phone or by letter within the next 1-3 weeks.  Call your gastroenterologist if you have not heard about the biopsies in 3 weeks.  Our staff will call the home number listed on your records the next business day following your procedure to check on you and address any questions or concerns that you may have at that time regarding the information given to you following your procedure. This is a courtesy call and so if there is no answer at the home number and we have not heard from you through the  emergency physician on call, we will assume that you have returned to your regular daily activities without incident.  SIGNATURES/CONFIDENTIALITY: You and/or your care partner have signed paperwork which will be entered into your electronic medical record.  These signatures attest to the fact that that the information above on your After Visit Summary has been reviewed and is understood.  Full responsibility of the confidentiality of this discharge information lies with you and/or your care-partner.

## 2013-02-13 NOTE — Progress Notes (Signed)
Report to pacu rn, vss, bbs=clear 

## 2013-02-13 NOTE — Telephone Encounter (Signed)
Message copied by Annett Fabian on Wed Feb 13, 2013 11:58 AM ------      Message from: Stan Head E      Created: Wed Feb 13, 2013  9:42 AM      Regarding: needs banding appt       He wants an appointment for banding      Please call him and arrange  ------

## 2013-02-13 NOTE — Telephone Encounter (Signed)
Patient is scheduled for 02/21/13 3:45

## 2013-02-13 NOTE — Progress Notes (Signed)
Patient did not experience any of the following events: a burn prior to discharge; a fall within the facility; wrong site/side/patient/procedure/implant event; or a hospital transfer or hospital admission upon discharge from the facility. (G8907) Patient did not have preoperative order for IV antibiotic SSI prophylaxis. (G8918)  

## 2013-02-14 ENCOUNTER — Telehealth: Payer: Self-pay | Admitting: *Deleted

## 2013-02-14 NOTE — Telephone Encounter (Signed)
  Follow up Call-  Call back number 02/13/2013  Post procedure Call Back phone  # [phone]  Permission to leave phone message Yes     Patient questions:  Do you have a fever, pain , or abdominal swelling? no Pain Score  0 *  Have you tolerated food without any problems? yes  Have you been able to return to your normal activities? yes  Do you have any questions about your discharge instructions: Diet   no Medications  no Follow up visit  no  Do you have questions or concerns about your Care? no  Actions: * If pain score is 4 or above: No action needed, pain <4.

## 2013-02-20 ENCOUNTER — Encounter: Payer: Self-pay | Admitting: Internal Medicine

## 2013-02-20 DIAGNOSIS — Z860100 Personal history of colon polyps, unspecified: Secondary | ICD-10-CM

## 2013-02-20 HISTORY — DX: Personal history of colon polyps, unspecified: Z86.0100

## 2013-02-20 HISTORY — DX: Personal history of colonic polyps: Z86.010

## 2013-02-20 NOTE — Progress Notes (Signed)
Quick Note:  2 subcentimeter adenomas repeat colon 2019 ______

## 2013-02-21 ENCOUNTER — Encounter: Payer: Self-pay | Admitting: Internal Medicine

## 2013-02-21 ENCOUNTER — Ambulatory Visit (INDEPENDENT_AMBULATORY_CARE_PROVIDER_SITE_OTHER): Payer: BC Managed Care – PPO | Admitting: Internal Medicine

## 2013-02-21 VITALS — BP 110/80 | HR 80 | Ht 71.0 in | Wt 231.8 lb

## 2013-02-21 DIAGNOSIS — K648 Other hemorrhoids: Secondary | ICD-10-CM

## 2013-02-21 NOTE — Progress Notes (Signed)
Patient ID: Marvin Chandler, male   DOB: 11-06-1950, 62 y.o.   MRN: [id number] He was not banded today because he was not symptomatic. No charge

## 2013-03-18 ENCOUNTER — Other Ambulatory Visit: Payer: Self-pay | Admitting: Family Medicine

## 2013-05-13 ENCOUNTER — Other Ambulatory Visit: Payer: Self-pay | Admitting: Family Medicine

## 2013-05-13 ENCOUNTER — Ambulatory Visit (INDEPENDENT_AMBULATORY_CARE_PROVIDER_SITE_OTHER): Payer: BC Managed Care – PPO | Admitting: Family Medicine

## 2013-05-13 ENCOUNTER — Ambulatory Visit: Payer: BC Managed Care – PPO

## 2013-05-13 VITALS — BP 116/64 | HR 60 | Temp 98.4°F | Resp 18 | Ht 70.0 in | Wt 230.0 lb

## 2013-05-13 DIAGNOSIS — R059 Cough, unspecified: Secondary | ICD-10-CM

## 2013-05-13 DIAGNOSIS — G5601 Carpal tunnel syndrome, right upper limb: Secondary | ICD-10-CM

## 2013-05-13 DIAGNOSIS — J181 Lobar pneumonia, unspecified organism: Secondary | ICD-10-CM

## 2013-05-13 DIAGNOSIS — R05 Cough: Secondary | ICD-10-CM

## 2013-05-13 DIAGNOSIS — R0789 Other chest pain: Secondary | ICD-10-CM

## 2013-05-13 DIAGNOSIS — J189 Pneumonia, unspecified organism: Secondary | ICD-10-CM

## 2013-05-13 DIAGNOSIS — M25511 Pain in right shoulder: Secondary | ICD-10-CM

## 2013-05-13 DIAGNOSIS — G5600 Carpal tunnel syndrome, unspecified upper limb: Secondary | ICD-10-CM

## 2013-05-13 DIAGNOSIS — J4521 Mild intermittent asthma with (acute) exacerbation: Secondary | ICD-10-CM

## 2013-05-13 DIAGNOSIS — R042 Hemoptysis: Secondary | ICD-10-CM

## 2013-05-13 MED ORDER — DICLOFENAC SODIUM 75 MG PO TBEC: DELAYED_RELEASE_TABLET | ORAL | Status: DC

## 2013-05-13 MED ORDER — LEVOFLOXACIN 500 MG PO TABS: 500.0000 mg | ORAL_TABLET | Freq: Every day | ORAL | Status: DC

## 2013-05-13 MED ORDER — HYDROCODONE-HOMATROPINE 5-1.5 MG/5ML PO SYRP: 5.0000 mL | ORAL_SOLUTION | ORAL | Status: DC | PRN

## 2013-05-13 MED ORDER — ALBUTEROL SULFATE HFA 108 (90 BASE) MCG/ACT IN AERS: 2.0000 | INHALATION_SPRAY | Freq: Four times a day (QID) | RESPIRATORY_TRACT | Status: DC | PRN

## 2013-05-13 NOTE — Patient Instructions (Signed)
Take cough syrup 1 teaspoon every 4-6 hours as needed for cough   Take the antibiotic one pill daily starting tonight and then each morning   Take the diclofenac one twice daily as needed for shoulder  Use the inhaler 2 puffs every 4-6 hours when needed for wheezing or coughing  Referral is being made to do a testing on carpal tunnel in right wrist  Referral is being made to an orthopedist for evaluation of the right shoulder  If your breathing and coughing is not improving by day after tomorrow please come back for recheck Wednesday

## 2013-05-13 NOTE — Progress Notes (Signed)
Subjective: Patient is here with several things. His right shoulder continues to hurt him. The shot I gave him only helped briefly. He cannot raise his arm well. He works as a Retail buyer.  His right hand stays, especially in the middle 3 fingers. He thinks he has carpal tunnel syndrome, a good guess.  He has about a 45 day history of flulike symptoms. He is coughing a lot. He coughs hard he coughs up some rest, but or slightly bloody sputum.  Objective: Decreased range of motion right shoulder  Grip is good in the right hand, but he has the tingling and numbness in the fingers.  TMs are normal. Throat clear. Neck supple without nodes. Chest has a few rales on the left  Assessment: Pneumonia Influenza Carpal tunnel syndrome Right shoulder pain, rule out rotator cuff disorder Hemoptysis  Plan  Refer to orthopedist Refer for  carpal tunnels testing  Chest x-ray  [facility] reading (PRIMARY) by  Dr. Linna Darner Probable early left mid lung infiltrate  .

## 2013-05-14 ENCOUNTER — Other Ambulatory Visit: Payer: Self-pay

## 2013-05-14 MED ORDER — GUAIFENESIN-CODEINE 100-10 MG/5ML PO SYRP: 5.0000 mL | ORAL_SOLUTION | Freq: Three times a day (TID) | ORAL | Status: DC | PRN

## 2013-05-14 NOTE — Telephone Encounter (Signed)
Sent Advair.  Rx printed and signed for Robitussin [facility]

## 2013-05-14 NOTE — Telephone Encounter (Signed)
Spoke with pt, advised cough medication ready to pick up, pt understood.

## 2013-05-14 NOTE — Telephone Encounter (Signed)
Pt saw Dr. Linna Darner last night and he went to a few different pharmacies and they could not find Hycodan because they were out and it takes a few days to get it in.  He would like to know if he can get something like robitussin ac or something else because he is coughing so much.

## 2013-05-24 ENCOUNTER — Other Ambulatory Visit: Payer: Self-pay | Admitting: Family Medicine

## 2013-05-27 ENCOUNTER — Encounter (HOSPITAL_COMMUNITY): Payer: Self-pay | Admitting: Emergency Medicine

## 2013-05-27 ENCOUNTER — Emergency Department (HOSPITAL_COMMUNITY): Payer: BC Managed Care – PPO

## 2013-05-27 ENCOUNTER — Emergency Department (HOSPITAL_COMMUNITY)
Admission: EM | Admit: 2013-05-27 | Discharge: 2013-05-27 | Disposition: A | Discharge status: Home / Self Care | Payer: BC Managed Care – PPO | Attending: Emergency Medicine | Admitting: Emergency Medicine

## 2013-05-27 ENCOUNTER — Other Ambulatory Visit: Payer: Self-pay | Admitting: Physician Assistant

## 2013-05-27 DIAGNOSIS — Z860100 Personal history of colon polyps, unspecified: Secondary | ICD-10-CM | POA: Insufficient documentation

## 2013-05-27 DIAGNOSIS — M6281 Muscle weakness (generalized): Secondary | ICD-10-CM | POA: Insufficient documentation

## 2013-05-27 DIAGNOSIS — Z8701 Personal history of pneumonia (recurrent): Secondary | ICD-10-CM | POA: Insufficient documentation

## 2013-05-27 DIAGNOSIS — R0602 Shortness of breath: Secondary | ICD-10-CM

## 2013-05-27 DIAGNOSIS — J45901 Unspecified asthma with (acute) exacerbation: Secondary | ICD-10-CM | POA: Insufficient documentation

## 2013-05-27 DIAGNOSIS — Z87891 Personal history of nicotine dependence: Secondary | ICD-10-CM | POA: Insufficient documentation

## 2013-05-27 DIAGNOSIS — J45909 Unspecified asthma, uncomplicated: Secondary | ICD-10-CM

## 2013-05-27 DIAGNOSIS — IMO0002 Reserved for concepts with insufficient information to code with codable children: Secondary | ICD-10-CM | POA: Insufficient documentation

## 2013-05-27 DIAGNOSIS — Z791 Long term (current) use of non-steroidal anti-inflammatories (NSAID): Secondary | ICD-10-CM | POA: Insufficient documentation

## 2013-05-27 DIAGNOSIS — R079 Chest pain, unspecified: Secondary | ICD-10-CM

## 2013-05-27 DIAGNOSIS — Z8719 Personal history of other diseases of the digestive system: Secondary | ICD-10-CM | POA: Insufficient documentation

## 2013-05-27 DIAGNOSIS — Z79899 Other long term (current) drug therapy: Secondary | ICD-10-CM | POA: Insufficient documentation

## 2013-05-27 DIAGNOSIS — Z87828 Personal history of other (healed) physical injury and trauma: Secondary | ICD-10-CM | POA: Insufficient documentation

## 2013-05-27 DIAGNOSIS — R0789 Other chest pain: Secondary | ICD-10-CM | POA: Insufficient documentation

## 2013-05-27 DIAGNOSIS — M1990 Unspecified osteoarthritis, unspecified site: Secondary | ICD-10-CM | POA: Insufficient documentation

## 2013-05-27 DIAGNOSIS — Z7982 Long term (current) use of aspirin: Secondary | ICD-10-CM | POA: Insufficient documentation

## 2013-05-27 HISTORY — DX: Pneumonia, unspecified organism: J18.9

## 2013-05-27 LAB — CBC WITH DIFFERENTIAL/PLATELET
Basophils Absolute: 0 10*3/uL (ref 0.0–0.1)
Basophils Relative: 1 % (ref 0–1)
EOS ABS: 0.1 10*3/uL (ref 0.0–0.7)
Eosinophils Relative: 2 % (ref 0–5)
HCT: 43 % (ref 39.0–52.0)
Hemoglobin: 14.6 g/dL (ref 13.0–17.0)
LYMPHS ABS: 2.4 10*3/uL (ref 0.7–4.0)
LYMPHS PCT: 46 % (ref 12–46)
MCH: 29.6 pg (ref 26.0–34.0)
MCHC: 34 g/dL (ref 30.0–36.0)
MCV: 87 fL (ref 78.0–100.0)
Monocytes Absolute: 0.4 10*3/uL (ref 0.1–1.0)
Monocytes Relative: 8 % (ref 3–12)
NEUTROS ABS: 2.3 10*3/uL (ref 1.7–7.7)
Neutrophils Relative %: 44 % (ref 43–77)
PLATELETS: 243 10*3/uL (ref 150–400)
RBC: 4.94 MIL/uL (ref 4.22–5.81)
RDW: 13.3 % (ref 11.5–15.5)
WBC: 5.2 10*3/uL (ref 4.0–10.5)

## 2013-05-27 LAB — COMPREHENSIVE METABOLIC PANEL
ALT: 20 U/L (ref 0–53)
AST: 17 U/L (ref 0–37)
Albumin: 3.6 g/dL (ref 3.5–5.2)
Alkaline Phosphatase: 67 U/L (ref 39–117)
BUN: 12 mg/dL (ref 6–23)
CO2: 26 meq/L (ref 19–32)
Calcium: 9.3 mg/dL (ref 8.4–10.5)
Chloride: 102 mEq/L (ref 96–112)
Creatinine, Ser: 1 mg/dL (ref 0.50–1.35)
GFR calc non Af Amer: 78 mL/min — ABNORMAL LOW (ref >90)
GFR, EST AFRICAN AMERICAN: 90 mL/min — AB (ref >90)
Glucose, Bld: 122 mg/dL — ABNORMAL HIGH (ref 70–99)
POTASSIUM: 4.3 meq/L (ref 3.7–5.3)
Sodium: 143 mEq/L (ref 137–147)
TOTAL PROTEIN: 6.8 g/dL (ref 6.0–8.3)
Total Bilirubin: 0.4 mg/dL (ref 0.3–1.2)

## 2013-05-27 LAB — TROPONIN I: Troponin I: <0.3 ng/mL (ref <0.30)

## 2013-05-27 MED ORDER — PREDNISONE 20 MG PO TABS: 40.0000 mg | ORAL_TABLET | Freq: Every day | ORAL | Status: DC

## 2013-05-27 MED ORDER — PREDNISONE 20 MG PO TABS
40.0000 mg | ORAL_TABLET | Freq: Once | ORAL | Status: AC
  Administered 2013-05-27: 40 mg via ORAL
  Filled 2013-05-27: qty 2

## 2013-05-27 NOTE — ED Notes (Signed)
The pt still has a productive cough

## 2013-05-27 NOTE — ED Notes (Signed)
He feels very weak and dizzy .  No pain at present

## 2013-05-27 NOTE — ED Notes (Signed)
The pt returned from xray  drowsy

## 2013-05-27 NOTE — Discharge Instructions (Signed)
Please use your albuterol treatment either nebulizer or inhaler every 4 hours for 24 hours, then every 4 hours as needed. Take prednisone once a day for the next 5 days. Return to your doctor for recheck in 2 days or the hospital if your symptoms worsen.  Your chest x-ray showed no signs of pneumonia, your lab work was normal  Please call your doctor for a followup appointment within 24-48 hours. When you talk to your doctor please let them know that you were seen in the emergency department and have them acquire all of your records so that they can discuss the findings with you and formulate a treatment plan to fully care for your new and ongoing problems.

## 2013-05-27 NOTE — ED Notes (Signed)
Pt is a x 4. Pt is ambulatory to discharge. Is in NAD.

## 2013-05-27 NOTE — ED Notes (Signed)
The pt is c/o mid-chest pain  Since yesterday with sl sob.  He woke up this am with the pain discomfort and he felt like he  Could not breathe very well.  No breathing difficulty at present now. He just finished  An antibiotic Friday where he was being treated for pneumonia

## 2013-05-27 NOTE — ED Notes (Signed)
Pt to xray

## 2013-05-27 NOTE — ED Provider Notes (Signed)
CSN: [id number]     Arrival date & time 05/27/13  0550 History   First MD Initiated Contact with Patient 05/27/13 [phone]     Chief Complaint  Patient presents with  . Chest Pain   (Consider location/radiation/quality/duration/timing/severity/associated sxs/prior Treatment) HPI Comments: 63 year old male, history of asthma and a history of right shoulder pain secondary to rotator cuff injury who was recently diagnosed with a clinical pneumonia (negative x-ray performed on 05/13/2013). The patient was treated with antibiotics, he finished a complete course of Levaquin but has had a persistent cough and over the last 3 days has had worsening shortness of breath as well as weakness. He describes a chest discomfort which is worse when he lays down, better when he gets up, not exertional and not associated with leg swelling. He denies history of cardiac disease and though he does have asthma his albuterol treatments have not been significantly helping him.  Patient is a 63 y.o. male presenting with chest pain. The history is provided by the patient and the spouse.  Chest Pain   Past Medical History  Diagnosis Date  . Asthma   . Cough   . Hiatal hernia   . Osteoarthritis   . Arthritis of hip     bilateral  . Personal history of colonic adenomas 02/20/2013  . Pneumonia    Past Surgical History  Procedure Laterality Date  . Total hip arthroplasty  2002    bilateral  . Colonoscopy  02/13/13   Family History  Problem Relation Age of Onset  . Dementia Mother   . Colon cancer Neg Hx   . Esophageal cancer Neg Hx    History  Substance Use Topics  . Smoking status: Former Smoker -- 1.00 packs/day for 7 years    Types: Cigarettes    Quit date: 11/05/1997  . Smokeless tobacco: Never Used  . Alcohol Use: No    Review of Systems  Cardiovascular: Positive for chest pain.  All other systems reviewed and are negative.    Allergies  Review of patient's allergies indicates no known  allergies.  Home Medications   Current Outpatient Rx  Name  Route  Sig  Dispense  Refill  . Acetaminophen-Guaifenesin (THERAFLU FLU/CHEST CONGESTION PO)   Oral   Take 1 Package by mouth every 8 (eight) hours as needed (cough, cold).         Marland Kitchen albuterol (PROVENTIL HFA) 108 (90 BASE) MCG/ACT inhaler   Inhalation   Inhale 2 puffs into the lungs every 6 (six) hours as needed for wheezing. PATIENT NEEDS OFFICE VISIT FOR ADDITIONAL REFILLS   6.7 g   0   . albuterol (PROVENTIL) (2.5 MG/3ML) 0.083% nebulizer solution   Nebulization   Take 3 mLs (2.5 mg total) by nebulization every 6 (six) hours as needed.   75 mL   1   . aspirin 81 MG chewable tablet   Oral   Chew 162 mg by mouth once.         . diclofenac (VOLTAREN) 75 MG EC tablet      TAKE 1 TABLET BY MOUTH TWICE DAILY   60 tablet   0   . Fluticasone-Salmeterol (ADVAIR DISKUS) 250-50 MCG/DOSE AEPB   Inhalation   Inhale 1 puff into the lungs daily.   60 each   2   . predniSONE (DELTASONE) 20 MG tablet   Oral   Take 2 tablets (40 mg total) by mouth daily.   10 tablet   0    BP  147/86  Pulse 65  Temp(Src) 98 F (36.7 C) (Oral)  Resp 14  SpO2 96% Physical Exam  Nursing note and vitals reviewed. Constitutional: He appears well-developed and well-nourished. No distress.  HENT:  Head: Normocephalic and atraumatic.  Mouth/Throat: Oropharynx is clear and moist. No oropharyngeal exudate.  Eyes: Conjunctivae and EOM are normal. Pupils are equal, round, and reactive to light. Right eye exhibits no discharge. Left eye exhibits no discharge. No scleral icterus.  Neck: Normal range of motion. Neck supple. No JVD present. No thyromegaly present.  Cardiovascular: Normal rate, regular rhythm, normal heart sounds and intact distal pulses.  Exam reveals no gallop and no friction rub.   No murmur heard. Pulmonary/Chest: Effort normal and breath sounds normal. No respiratory distress. He has no wheezes. He has no rales.   Abdominal: Soft. Bowel sounds are normal. He exhibits no distension and no mass. There is no tenderness.  Musculoskeletal: Normal range of motion. He exhibits no edema and no tenderness.  Lymphadenopathy:    He has no cervical adenopathy.  Neurological: He is alert. Coordination normal.  Skin: Skin is warm and dry. No rash noted. No erythema.  Psychiatric: He has a normal mood and affect. His behavior is normal.    ED Course  Procedures (including critical care time) Labs Review Labs Reviewed  COMPREHENSIVE METABOLIC PANEL - Abnormal; Notable for the following:    Glucose, Bld 122 (*)    GFR calc non Af Amer 78 (*)    GFR calc Af Amer 90 (*)    All other components within normal limits  CBC WITH DIFFERENTIAL  TROPONIN I   Imaging Review Dg Chest 2 View  05/27/2013   CLINICAL DATA:  Persisting cough and chest pain.  EXAM: CHEST  2 VIEW  COMPARISON:  Chest radiograph May 13, 2013  FINDINGS: Cardiomediastinal silhouette is unremarkable. The lungs are clear without pleural effusions or focal consolidations. Pulmonary vasculature is unremarkable. Trachea projects midline and there is no pneumothorax. Soft tissue planes and included osseous structures are nonsuspicious. Mild degenerative change of thoracic spine.  IMPRESSION: No acute cardiopulmonary process ; stable appearance of chest from May 13, 2013.   Electronically Signed   By: Elon Alas   On: 05/27/2013 06:44    ED ECG REPORT  I personally interpreted this EKG   Date: 05/27/2013   Rate: 63  Rhythm: normal sinus rhythm  QRS Axis: normal  Intervals: normal  ST/T Wave abnormalities: normal  Conduction Disutrbances:none  Narrative Interpretation:   Old EKG Reviewed: Compared with 12/16/2011, no significant changes   MDM   1. Shortness of breath   2. Chest pain   3. Asthma    No peripheral edema, normal lung sounds, no rales or wheezing and speaking in full sentences with oxygenation 100% on room air.  Blood  pressure normal, pulse 65, afebrile. We'll perform imaging to rule out pneumonia, pneumothorax, labs to evaluate for abnormalities. Patient appears comfortable at this time.  CXR normal - labs normal, pt reevaluated and has clear lungs - stable for d/c.  Meds given in ED:  Medications  predniSONE (DELTASONE) tablet 40 mg (not administered)    New Prescriptions   PREDNISONE (DELTASONE) 20 MG TABLET    Take 2 tablets (40 mg total) by mouth daily.      Johnna Acosta, MD 05/27/13 [phone]

## 2013-09-02 ENCOUNTER — Other Ambulatory Visit: Payer: Self-pay | Admitting: Family Medicine

## 2013-11-26 ENCOUNTER — Telehealth: Payer: Self-pay

## 2013-11-26 ENCOUNTER — Ambulatory Visit (INDEPENDENT_AMBULATORY_CARE_PROVIDER_SITE_OTHER): Payer: BC Managed Care – PPO

## 2013-11-26 ENCOUNTER — Ambulatory Visit (INDEPENDENT_AMBULATORY_CARE_PROVIDER_SITE_OTHER): Payer: BC Managed Care – PPO | Admitting: Emergency Medicine

## 2013-11-26 VITALS — BP 120/78 | HR 66 | Temp 98.0°F | Resp 16 | Ht 70.0 in | Wt 227.5 lb

## 2013-11-26 DIAGNOSIS — R0602 Shortness of breath: Secondary | ICD-10-CM

## 2013-11-26 DIAGNOSIS — J45901 Unspecified asthma with (acute) exacerbation: Secondary | ICD-10-CM

## 2013-11-26 DIAGNOSIS — J4531 Mild persistent asthma with (acute) exacerbation: Secondary | ICD-10-CM

## 2013-11-26 MED ORDER — ALBUTEROL SULFATE HFA 108 (90 BASE) MCG/ACT IN AERS: 2.0000 | INHALATION_SPRAY | RESPIRATORY_TRACT | Status: DC | PRN

## 2013-11-26 MED ORDER — ALBUTEROL SULFATE (2.5 MG/3ML) 0.083% IN NEBU
5.0000 mg | INHALATION_SOLUTION | Freq: Once | RESPIRATORY_TRACT | Status: AC
  Administered 2013-11-26: 5 mg via RESPIRATORY_TRACT

## 2013-11-26 MED ORDER — IPRATROPIUM BROMIDE 0.02 % IN SOLN
0.5000 mg | Freq: Once | RESPIRATORY_TRACT | Status: AC
  Administered 2013-11-26: 0.5 mg via RESPIRATORY_TRACT

## 2013-11-26 MED ORDER — NEBULIZER COMPRESSOR MISC: Status: DC

## 2013-11-26 MED ORDER — ALBUTEROL SULFATE (2.5 MG/3ML) 0.083% IN NEBU: 2.5000 mg | INHALATION_SOLUTION | Freq: Four times a day (QID) | RESPIRATORY_TRACT | Status: DC | PRN

## 2013-11-26 MED ORDER — FLUTICASONE-SALMETEROL 250-50 MCG/DOSE IN AEPB: 1.0000 | INHALATION_SPRAY | Freq: Two times a day (BID) | RESPIRATORY_TRACT | Status: DC

## 2013-11-26 NOTE — Progress Notes (Signed)
[facility] [address] [place] [address] [phone]  Date:  11/26/2013   Name:  Marvin Chandler   DOB:  Mar 28, 1951   MRN:  [id number]  PCP:  Ruben Reason, MD    Chief Complaint: Asthma and Medication Refill   History of Present Illness:  Marvin Chandler is a 63 y.o. very pleasant male patient who presents with the following:  Ill for past three weeks with increasing congestion and cough productive of purulent sputum.  Wheezing and shortness of breath with exertion.  Using advair and proventil.  Pneumonia last winter in January.  Has some orthopnea and PND.  Lost his nebulizer. Non smoker.  No improvement with over the counter medications or other home remedies. Denies other complaint or health concern today.   Patient Active Problem List   Diagnosis Date Noted  . Personal history of colonic adenomas 02/20/2013  . Dyspnea 12/16/2011  . Chest pain 12/16/2011  . ASTHMA 07/11/2006  . ASTHMA NOS W/STATUS ASTHMATICUS 07/11/2006  . HIATAL HERNIA 07/11/2006  . OSTEOARTHRITIS 07/11/2006  . ARTHRITIS, HIPS, BILATERAL 07/11/2006    Past Medical History  Diagnosis Date  . Asthma   . Cough   . Hiatal hernia   . Osteoarthritis   . Arthritis of hip     bilateral  . Personal history of colonic adenomas 02/20/2013  . Pneumonia     Past Surgical History  Procedure Laterality Date  . Total hip arthroplasty  2002    bilateral  . Colonoscopy  02/13/13    History  Substance Use Topics  . Smoking status: Former Smoker -- 1.00 packs/day for 7 years    Types: Cigarettes    Quit date: 11/05/1997  . Smokeless tobacco: Never Used  . Alcohol Use: No    Family History  Problem Relation Age of Onset  . Dementia Mother   . Colon cancer Neg Hx   . Esophageal cancer Neg Hx     No Known Allergies  Medication list has been reviewed and updated.  Current Outpatient Prescriptions on File Prior to Visit  Medication Sig Dispense Refill  .  Acetaminophen-Guaifenesin (THERAFLU FLU/CHEST CONGESTION PO) Take 1 Package by mouth every 8 (eight) hours as needed (cough, cold).      Marland Kitchen albuterol (PROVENTIL) (2.5 MG/3ML) 0.083% nebulizer solution INHALE 3MLS BY NEBULIZATION EVERY 6 HOURS AS NEEDED  75 mL  5  . aspirin 81 MG chewable tablet Chew 162 mg by mouth once.      . diclofenac (VOLTAREN) 75 MG EC tablet TAKE 1 TABLET BY MOUTH TWICE DAILY  60 tablet  0  . Fluticasone-Salmeterol (ADVAIR DISKUS) 250-50 MCG/DOSE AEPB Inhale 1 puff into the lungs daily.  60 each  2  . PROVENTIL HFA 108 (90 BASE) MCG/ACT inhaler INHALE 2 PUFFS BY MOUTH INTO THE LUNGS EVERY 6 HOURS AS NEEDED FOR WHEEZING  6.7 g  5   No current facility-administered medications on file prior to visit.    Review of Systems:  As per HPI, otherwise negative.    Physical Examination: Filed Vitals:   11/26/13 1945  BP: 120/78  Pulse: 66  Temp: 98 F (36.7 C)  Resp: 16   Filed Vitals:   11/26/13 1945  Height: 5\' 10"  (1.778 m)  Weight: 227 lb 8 oz (103.193 kg)   Body mass index is 32.64 kg/(m^2). Ideal Body Weight: Weight in (lb) to have BMI = 25: 173.9  GEN: WDWN, ill appearing, Non-toxic, A & O x  3 HEENT: Atraumatic, Normocephalic. Neck supple. No masses, No LAD. Ears and Nose: No external deformity. CV: RRR, No M/G/R. No JVD. No thrill. No extra heart sounds. PULM: CTA B, no wheezes, crackles, rhonchi. No retractions. No resp. distress. No accessory muscle use. ABD: S, NT, ND, +BS. No rebound. No HSM. EXTR: No c/c/e NEURO Normal gait.  PSYCH: Normally interactive. Conversant. Not depressed or anxious appearing.  Calm demeanor.    Assessment and Plan: Exacerbation asthma Refill MDI  Signed,  Ellison Carwin, MD  [facility] reading (PRIMARY) by  Dr. Ouida Sills.  No acute changes .

## 2013-11-26 NOTE — Telephone Encounter (Signed)
CB [id number]   Patient's wife called [phone]) and states that her husband's advair was not refilled. Patient is going out of town and needs this refilled. Please return call and advise.

## 2013-11-26 NOTE — Addendum Note (Signed)
Addended by: Roselee Culver on: 11/26/2013 08:38 PM   Modules accepted: Orders

## 2013-11-26 NOTE — Patient Instructions (Signed)

## 2013-11-27 ENCOUNTER — Telehealth: Payer: Self-pay

## 2013-11-27 MED ORDER — DICLOFENAC SODIUM 75 MG PO TBEC: DELAYED_RELEASE_TABLET | ORAL | Status: DC

## 2013-11-27 NOTE — Telephone Encounter (Signed)
Patient request a refill on "Diclofenac Sodium". Requesting it to be sent to [facility] at [facility] ([address] Patients call back number is [phone].

## 2013-11-27 NOTE — Telephone Encounter (Signed)
Sent in RF. Notified pt of RF and need to f/up if continues to need this med to make sure nothing else or referral is needed. Pt agreed.

## 2013-11-27 NOTE — Telephone Encounter (Signed)
LMOM that Advair was sent into pharm along w/albuterol yesterday. Asked pt to CB if pharm still does not have it.

## 2013-12-31 ENCOUNTER — Encounter: Payer: Self-pay | Admitting: Family Medicine

## 2013-12-31 ENCOUNTER — Ambulatory Visit (INDEPENDENT_AMBULATORY_CARE_PROVIDER_SITE_OTHER): Payer: BC Managed Care – PPO | Admitting: Family Medicine

## 2013-12-31 VITALS — BP 120/78 | HR 75 | Temp 98.2°F | Resp 16 | Ht 70.5 in | Wt 223.5 lb

## 2013-12-31 DIAGNOSIS — R5383 Other fatigue: Secondary | ICD-10-CM

## 2013-12-31 DIAGNOSIS — Z0000 Encounter for general adult medical examination without abnormal findings: Secondary | ICD-10-CM

## 2013-12-31 DIAGNOSIS — Z125 Encounter for screening for malignant neoplasm of prostate: Secondary | ICD-10-CM

## 2013-12-31 DIAGNOSIS — Z23 Encounter for immunization: Secondary | ICD-10-CM

## 2013-12-31 DIAGNOSIS — K219 Gastro-esophageal reflux disease without esophagitis: Secondary | ICD-10-CM

## 2013-12-31 DIAGNOSIS — R5381 Other malaise: Secondary | ICD-10-CM

## 2013-12-31 DIAGNOSIS — R7309 Other abnormal glucose: Secondary | ICD-10-CM

## 2013-12-31 LAB — POCT GLYCOSYLATED HEMOGLOBIN (HGB A1C): Hemoglobin A1C: 5.9

## 2013-12-31 MED ORDER — DICLOFENAC SODIUM 75 MG PO TBEC: DELAYED_RELEASE_TABLET | ORAL | Status: DC

## 2013-12-31 MED ORDER — MONTELUKAST SODIUM 10 MG PO TABS: 10.0000 mg | ORAL_TABLET | Freq: Every day | ORAL | Status: DC

## 2013-12-31 MED ORDER — OMEPRAZOLE 40 MG PO CPDR: DELAYED_RELEASE_CAPSULE | ORAL | Status: DC

## 2013-12-31 NOTE — Progress Notes (Signed)
Subjective:    Patient ID: Marvin Chandler, male    DOB: Jan 21, 1951, 63 y.o.   MRN: [id number]  HPI  This 63 y.o. AA male requests a complete physical to evaluate fatigue and breathing difficulties. He has moderate persistent Asthma; c/o having to use rescue MDI more than 4 times /day.  Also uses Advair Diskus 2x per day. Pt works in a dusty environment and is exposed to pollen and other allergens. He does not wear a mask. He requests refill of Singulair as that medication was very effective in the past. He reports getting SOB of exercise and sex.  Pt also has GERD and has some difficulty swallowing (food gets stuck in throat); OTC Famotidine is ineffective. Also uses TUMS.  Pt has chronic joint pains (shoulders, wrists and hands). He reports a lot of pain "deep in shoulder". Diclofenac is effective. 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  HCM: CRS- Current (2014- polyps).           IMM- Reports a Pneumonia vaccine; Tetanus ~ 2008 (not Tdap).           Vision- biannually.   Patient Active Problem List   Diagnosis Date Noted  . Personal history of colonic adenomas 02/20/2013  . Dyspnea 12/16/2011  . Chest pain 12/16/2011  . ASTHMA 07/11/2006  . ASTHMA NOS W/STATUS ASTHMATICUS 07/11/2006  . HIATAL HERNIA 07/11/2006  . OSTEOARTHRITIS 07/11/2006  . ARTHRITIS, HIPS, BILATERAL 07/11/2006    Prior to Admission medications   Medication Sig Start Date End Date Taking? Authorizing Provider  Acetaminophen-Guaifenesin (THERAFLU FLU/CHEST CONGESTION PO) Take 1 Package by mouth every 8 (eight) hours as needed (cough, cold).   Yes Historical Provider, MD  albuterol (PROVENTIL HFA) 108 (90 BASE) MCG/ACT inhaler Inhale 2 puffs into the lungs every 4 (four) hours as needed for wheezing or shortness of breath. 11/26/13  Yes Roselee Culver, MD  albuterol (PROVENTIL) (2.5 MG/3ML) 0.083% nebulizer solution Take 3 mLs (2.5 mg total) by nebulization every 6 (six) hours  as needed for wheezing or shortness of breath. 11/26/13  Yes Roselee Culver, MD  aspirin 81 MG chewable tablet Chew 162 mg by mouth once.   Yes Historical Provider, MD  diclofenac (VOLTAREN) 75 MG EC tablet TAKE 1 TABLET BY MOUTH TWICE DAILY. 12/31/13  Yes Barton Fanny, MD  Fluticasone-Salmeterol (ADVAIR DISKUS) 250-50 MCG/DOSE AEPB Inhale 1 puff into the lungs 2 (two) times daily. 11/26/13  Yes Roselee Culver, MD  Nebulizers (NEBULIZER COMPRESSOR) MISC Use nebulizer qid as needed 11/26/13  Yes Roselee Culver, MD  montelukast (SINGULAIR) 10 MG tablet Take 1 tablet (10 mg total) by mouth at bedtime. 12/31/13   Barton Fanny, MD  omeprazole (PRILOSEC) 40 MG capsule Take 1 capsule by mouth every morning on an empty stomach. Take 30 minutes before eating. 12/31/13   Barton Fanny, MD    Prior to Admission medications   Medication Sig Start Date End Date Taking? Authorizing Provider  Acetaminophen-Guaifenesin (THERAFLU FLU/CHEST CONGESTION PO) Take 1 Package by mouth every 8 (eight) hours as needed (cough, cold).   Yes Historical Provider, MD  albuterol (PROVENTIL HFA) 108 (90 BASE) MCG/ACT inhaler Inhale 2 puffs into the lungs every 4 (four) hours as needed for wheezing or shortness of breath. 11/26/13  Yes Roselee Culver, MD  albuterol (PROVENTIL) (2.5 MG/3ML) 0.083% nebulizer solution Take 3 mLs (2.5 mg total) by nebulization every 6 (six) hours as  needed for wheezing or shortness of breath. 11/26/13  Yes Roselee Culver, MD  aspirin 81 MG chewable tablet Chew 162 mg by mouth once.   Yes Historical Provider, MD  diclofenac (VOLTAREN) 75 MG EC tablet TAKE 1 TABLET BY MOUTH TWICE DAILY.   Yes Barton Fanny, MD  Fluticasone-Salmeterol (ADVAIR DISKUS) 250-50 MCG/DOSE AEPB Inhale 1 puff into the lungs 2 (two) times daily. 11/26/13  Yes Roselee Culver, MD  Nebulizers (NEBULIZER COMPRESSOR) MISC Use nebulizer qid as needed 11/26/13  Yes Roselee Culver, MD    History     Social History  . Marital Status: Single    Spouse Name: N/A    Number of Children: 2  . Years of Education: N/A   Occupational History  . 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 Smoking status: Former Smoker -- 1.00 packs/day for 7 years    Types: Cigarettes    Quit date: 11/05/1997  . Smokeless tobacco: Never Used  . Alcohol Use: No  . Drug Use: No  . Sexual Activity: Not on file   Other Topics Concern  . Not on file   Social History Narrative  . No narrative on file    Family History  Problem Relation Age of Onset  . Dementia Mother   . Colon cancer Neg Hx   . Esophageal cancer Neg Hx     Review of Systems  Constitutional: Positive for activity change and fatigue. Negative for fever, chills, diaphoresis and unexpected weight change.  HENT: Positive for congestion, sneezing and trouble swallowing. Negative for sinus pressure, sore throat and voice change.   Eyes: Negative.   Respiratory: Positive for cough and choking. Negative for chest tightness, wheezing and stridor.   Cardiovascular: Negative.   Gastrointestinal: Positive for nausea and abdominal pain. Negative for vomiting, blood in stool and abdominal distention.  Endocrine: Negative.   Genitourinary: Negative.   Musculoskeletal: Positive for arthralgias and gait problem. Negative for myalgias.  Skin: Negative.   Allergic/Immunologic: Positive for environmental allergies.  Neurological: Negative.   Hematological: Negative.   Psychiatric/Behavioral: Negative.       Objective:   Physical Exam  Nursing note and vitals reviewed. Constitutional: He is oriented to person, place, and time. Vital signs are normal. He appears well-developed and well-nourished. No distress.  HENT:  Head: Normocephalic and atraumatic.  Right Ear: Hearing, tympanic membrane, external ear and ear canal normal.  Left Ear: Hearing, tympanic membrane, external ear and ear canal normal.  Nose: Mucosal  edema present. No nasal deformity or septal deviation. Right sinus exhibits no maxillary sinus tenderness and no frontal sinus tenderness. Left sinus exhibits no maxillary sinus tenderness and no frontal sinus tenderness.  Mouth/Throat: Uvula is midline and mucous membranes are normal. No oral lesions. Normal dentition. No dental caries. Posterior oropharyngeal erythema present. No oropharyngeal exudate or posterior oropharyngeal edema.  Eyes: Conjunctivae, EOM and lids are normal. Pupils are equal, round, and reactive to light. No scleral icterus.  Fundoscopic exam:      The right eye shows no papilledema. The right eye shows red reflex.       The left eye shows no papilledema. The left eye shows red reflex.  Fundoscopic exam difficult.  Neck: Trachea normal, normal range of motion, full passive range of motion without pain and phonation normal. Neck supple. No JVD present. No spinous process tenderness and no muscular tenderness present. Carotid bruit is not present. No mass and no  thyromegaly present.  Cardiovascular: Normal rate, regular rhythm, S1 normal, S2 normal, normal heart sounds, intact distal pulses and normal pulses.   No extrasystoles are present. PMI is not displaced.  Exam reveals no gallop and no friction rub.   No murmur heard. Pulmonary/Chest: Effort normal and breath sounds normal. No respiratory distress. He has no decreased breath sounds. He has no wheezes.  Abdominal: Soft. Normal appearance and bowel sounds are normal. He exhibits no distension, no abdominal bruit, no pulsatile midline mass and no mass. There is no hepatosplenomegaly. There is tenderness in the epigastric area. There is no rigidity, no guarding and no CVA tenderness. No hernia.  Genitourinary: Rectum normal and prostate normal. Rectal exam shows no external hemorrhoid, no fissure, no mass, no tenderness and anal tone normal. Prostate is not enlarged and not tender.  Musculoskeletal:       Right shoulder: He  exhibits decreased range of motion and tenderness. He exhibits no effusion, no crepitus and no deformity.       Left shoulder: He exhibits decreased range of motion, tenderness and deformity. He exhibits no swelling and no effusion.       Right hip: He exhibits decreased range of motion.       Left hip: Normal.       Cervical back: Normal.       Thoracic back: Normal.       Lumbar back: Normal.  Well healed posterior R hip scar. Pt to return for specific exam of joints/ MS system.  Lymphadenopathy:       Head (right side): No submental, no submandibular, no tonsillar, no preauricular, no posterior auricular and no occipital adenopathy present.       Head (left side): No submental, no submandibular, no tonsillar, no preauricular, no posterior auricular and no occipital adenopathy present.    He has no cervical adenopathy.       Right: No inguinal and no supraclavicular adenopathy present.       Left: No inguinal and no supraclavicular adenopathy present.  Neurological: He is alert and oriented to person, place, and time. He has normal strength. He displays no tremor. No cranial nerve deficit or sensory deficit. He exhibits normal muscle tone. He displays a negative Romberg sign. Gait abnormal. Coordination normal.  Reflex Scores:      Tricep reflexes are 1+ on the right side and 1+ on the left side.      Bicep reflexes are 1+ on the right side and 1+ on the left side.      Brachioradialis reflexes are 1+ on the right side and 1+ on the left side.      Patellar reflexes are 2+ on the right side and 2+ on the left side. Antalgic gait.  Skin: Skin is warm, dry and intact. No ecchymosis, no lesion and no rash noted. He is not diaphoretic. No cyanosis.  Psychiatric: He has a normal mood and affect. His speech is normal and behavior is normal. Judgment and thought content normal. Cognition and memory are normal.    Results for orders placed in visit on 12/31/13  POCT GLYCOSYLATED HEMOGLOBIN (HGB  A1C)      Result Value Ref Range   Hemoglobin A1C 5.9         Assessment & Plan:  Routine general medical examination at a health care facility - Plan: POCT glycosylated hemoglobin (Hb A1C), PSA, Vit D  25 hydroxy (rtn osteoporosis monitoring), Lipid panel, BASIC METABOLIC PANEL WITH GFR  Asthma, chronic,  moderate persistent, uncomplicated- Continue Advair, Proventil MDI prn and add back Singulair 10 mg 1 tablet at bedtime. Wear a mask when at work to reduce exposure to allergens and other environmental triggers.  Gastroesophageal reflux disease without esophagitis - Trial Omeprazole 40 mg 1 capsule every morning on empty stomach. Plan: Thyroid Panel With TSH, Ambulatory referral to Gastroenterology  Other malaise and fatigue - Consider Sleep Study if Asthma controlled but symptom persists. Plan: POCT glycosylated hemoglobin (Hb A1C), Thyroid Panel With TSH, Vit D  25 hydroxy (rtn osteoporosis monitoring)  Impaired glucose metabolism - Recommended nutrition changes; eliminate sodas, excess bread/carbs, reduce portion sizes.  Plan: BASIC METABOLIC PANEL WITH GFR  Screening PSA (prostate specific antigen) - Plan: PSA  Need for Tdap vaccination - Plan: Tdap vaccine greater than or equal to 7yo IM   Meds ordered this encounter  Medications  . montelukast (SINGULAIR) 10 MG tablet    Sig: Take 1 tablet (10 mg total) by mouth at bedtime.    Dispense:  30 tablet    Refill:  11  . diclofenac (VOLTAREN) 75 MG EC tablet    Sig: TAKE 1 TABLET BY MOUTH TWICE DAILY.    Dispense:  60 tablet    Refill:  3  . omeprazole (PRILOSEC) 40 MG capsule    Sig: Take 1 capsule by mouth every morning on an empty stomach. Take 30 minutes before eating.    Dispense:  30 capsule    Refill:  3

## 2013-12-31 NOTE — Patient Instructions (Addendum)
Keeping you healthy  Get these tests  Blood pressure- Have your blood pressure checked once a year by your healthcare provider.  Normal blood pressure is 120/80  Weight- Have your body mass index (BMI) calculated to screen for obesity.  BMI is a measure of body fat based on height and weight. You can also calculate your own BMI at [email or web address].  Cholesterol- Have your cholesterol checked every year.  Diabetes- Have your blood sugar checked regularly if you have high blood pressure, high cholesterol, have a family history of diabetes or if you are overweight.  Screening for Colon Cancer- Colonoscopy starting at age 43.  Screening may begin sooner depending on your family history and other health conditions. Follow up colonoscopy as directed by your Gastroenterologist.  Screening for Prostate Cancer- Both blood work (PSA) and a rectal exam help screen for Prostate Cancer.  Screening begins at age 65 with African-American men and at age 38 with Caucasian men.  Screening may begin sooner depending on your family history.  Take these medicines  Aspirin- One aspirin daily can help prevent Heart disease and Stroke.  Flu shot- Every fall.  Tetanus- Every 10 years.  You received Tdap vaccine today; next Tetanus is due in 2025.  Zostavax- Once after the age of 20 to prevent Shingles. We do not keep this vaccine at out facility; this vaccine is given at your local pharmacy.  Pneumonia shot- Once after the age of 52; if you are younger than 31, ask your healthcare provider if you need a Pneumonia shot. You should get a pneumonia vaccine booster since you are over age 36. This can be discussed at your follow-up visit.  Take these steps  Don't smoke- If you do smoke, talk to your doctor about quitting.  For tips on how to quit, go to www.smokefree.gov or call 1-800-QUIT-NOW.  Be physically active- Exercise 5 days a week for at least 30 minutes.  If you are not already physically active  start slow and gradually work up to 30 minutes of moderate physical activity.  Examples of moderate activity include walking briskly, mowing the yard, dancing, swimming, bicycling, etc.  Eat a healthy diet- Eat a variety of healthy food such as fruits, vegetables, low fat milk, low fat cheese, yogurt, lean meant, poultry, fish, beans, tofu, etc. For more information go to www.thenutritionsource.org  Drink alcohol in moderation- Limit alcohol intake to less than two drinks a day. Never drink and drive.  Dentist- Brush and floss twice daily; visit your dentist twice a year.  Depression- Your emotional health is as important as your physical health. If you're feeling down, or losing interest in things you would normally enjoy please talk to your healthcare provider.  Eye exam- Visit your eye doctor every year.  Safe sex- If you may be exposed to a sexually transmitted infection, use a condom.  Seat belts- Seat belts can save your life; always wear one.  Smoke/Carbon Monoxide detectors- These detectors need to be installed on the appropriate level of your home.  Replace batteries at least once a year.  Skin cancer- When out in the sun, cover up and use sunscreen 15 SPF or higher.  Violence- If anyone is threatening you, please tell your healthcare provider.  Living Will/ Health care power of attorney- Speak with your healthcare provider and family.    I am referring you to Dr. Carlean Purl to evaluate your esophagus and the swallowing difficulties you are having. I am prescribing a medication specifically for reflux  until you can be seen by Dr. Carlean Purl.  I think you should also have a sleep study to evaluate the sleep and breathing problems you described. Singular for Asthma should help reduce your sleep problems. Let's see how this medication works then get a sleep study if you are not better.    Asthma Asthma is a condition of the lungs in which the airways tighten and narrow. Asthma can  make it hard to breathe. Asthma cannot be cured, but medicine and lifestyle changes can help control it. Asthma may be started (triggered) by:  Animal skin flakes (dander).  Dust.  Cockroaches.  Pollen.  Mold.  Smoke.  Cleaning products.  Hair sprays or aerosol sprays.  Paint fumes or strong smells.  Cold air, weather changes, and winds.  Crying or laughing hard.  Stress.  Certain medicines or drugs.  Foods, such as dried fruit, potato chips, and sparkling grape juice.  Infections or conditions (colds, flu).  Exercise.  Certain medical conditions or diseases.  Exercise or tiring activities. HOME CARE   Take medicine as told by your doctor.  Use a peak flow meter as told by your doctor. A peak flow meter is a tool that measures how well the lungs are working.  Record and keep track of the peak flow meter's readings.  Understand and use the asthma action plan. An asthma action plan is a written plan for taking care of your asthma and treating your attacks.  To help prevent asthma attacks:  Do not smoke. Stay away from secondhand smoke.  Change your heating and air conditioning filter often.  Limit your use of fireplaces and wood stoves.  Get rid of pests (such as roaches and mice) and their droppings.  Throw away plants if you see mold on them.  Clean your floors. Dust regularly. Use cleaning products that do not smell.  Have someone vacuum when you are not home. Use a vacuum cleaner with a HEPA filter if possible.  Replace carpet with wood, tile, or vinyl flooring. Carpet can trap animal skin flakes and dust.  Use allergy-proof pillows, mattress covers, and box spring covers.  Wash bed sheets and blankets every week in hot water and dry them in a dryer.  Use blankets that are made of polyester or cotton.  Clean bathrooms and kitchens with bleach. If possible, have someone repaint the walls in these rooms with mold-resistant paint. Keep out of the  rooms that are being cleaned and painted.  Wash hands often. GET HELP IF:  You have make a whistling sound when breaking (wheeze), have shortness of breath, or have a cough even if taking medicine to prevent attacks.  The colored mucus you cough up (sputum) is thicker than usual.  The colored mucus you cough up changes from clear or white to yellow, green, gray, or bloody.  You have problems from the medicine you are taking such as:  A rash.  Itching.  Swelling.  Trouble breathing.  You need reliever medicines more than 2-3 times a week.  Your peak flow measurement is still at 50-79% of your personal best after following the action plan for 1 hour.  You have a fever. GET HELP RIGHT AWAY IF:   You seem to be worse and are not responding to medicine during an asthma attack.  You are short of breath even at rest.  You get short of breath when doing very little activity.  You have trouble eating, drinking, or talking.  You have chest  pain.  You have a fast heartbeat.  Your lips or fingernails start to turn blue.  You are light-headed, dizzy, or faint.  Your peak flow is less than 50% of your personal best. MAKE SURE YOU:   Understand these instructions.  Will watch your condition.  Will get help right away if you are not doing well or get worse. Document Released: 10/05/2007 Document Revised: 09/02/2013 Document Reviewed: 11/15/2012 [facility] Patient Information 2015 [place], [place]. This information is not intended to replace advice given to you by your health care provider. Make sure you discuss any questions you have with your health care provider.   Wear a mask when you are working outdoors or in dusty surroundings; this will protect you and reduce your exposure to Asthma triggers (dust, pollen, etc).

## 2014-01-01 LAB — THYROID PANEL WITH TSH
FREE THYROXINE INDEX: 2.1 (ref 1.4–3.8)
T3 UPTAKE: 28 % (ref 22.0–35.0)
T4, Total: 7.4 ug/dL (ref 4.5–12.0)
TSH: 2.179 u[IU]/mL (ref 0.350–4.500)

## 2014-01-01 LAB — BASIC METABOLIC PANEL WITH GFR
BUN: 16 mg/dL (ref 6–23)
CHLORIDE: 106 meq/L (ref 96–112)
CO2: 31 mEq/L (ref 19–32)
Calcium: 9.6 mg/dL (ref 8.4–10.5)
Creat: 1.08 mg/dL (ref 0.50–1.35)
GFR, Est African American: 83 mL/min
GFR, Est Non African American: 72 mL/min
GLUCOSE: 123 mg/dL — AB (ref 70–99)
POTASSIUM: 4.2 meq/L (ref 3.5–5.3)
Sodium: 143 mEq/L (ref 135–145)

## 2014-01-01 LAB — LIPID PANEL
Cholesterol: 189 mg/dL (ref 0–200)
HDL: 36 mg/dL — AB (ref >39)
LDL Cholesterol: 111 mg/dL — ABNORMAL HIGH (ref 0–99)
Total CHOL/HDL Ratio: 5.3 Ratio
Triglycerides: 209 mg/dL — ABNORMAL HIGH (ref <150)
VLDL: 42 mg/dL — ABNORMAL HIGH (ref 0–40)

## 2014-01-01 LAB — PSA: PSA: 0.57 ng/mL (ref <=4.00)

## 2014-01-01 LAB — VITAMIN D 25 HYDROXY (VIT D DEFICIENCY, FRACTURES): Vit D, 25-Hydroxy: 22 ng/mL — ABNORMAL LOW (ref 30–89)

## 2014-01-03 NOTE — Progress Notes (Signed)
Quick Note:  Please advise pt regarding following labs... Thyroid tests are normal. Prostate blood test is normal. Vitamin D is too low; get over-the-counter Vitamin D3 2000 units and take 1 capsule daily.  Lipid panel shows elevated LDL ("bad") cholesterol and HDL ("good") cholesterol is too low. This increases your risk of heart disease.   Improve nutrition with the MEDITERRANEAN DIET- see below.      Mediterranean Diet  Why follow it? Research shows. Those who follow the Mediterranean diet have a reduced risk of heart disease  The diet is associated with a reduced incidence of Parkinson's and Alzheimer's diseases People following the diet may have longer life expectancies and lower rates of chronic diseases  The Dietary Guidelines for Americans recommends the Mediterranean diet as an eating plan to promote health and prevent disease  What Is the Mediterranean Diet?  Healthy eating plan based on typical foods and recipes of Mediterranean-style cooking The diet is primarily a plant based diet; these foods should make up a majority of meals   Starches - Plant based foods should make up a majority of meals - They are an important sources of vitamins, minerals, energy, antioxidants, and fiber - Choose whole grains, foods high in fiber and minimally processed items  - Typical grain sources include wheat, oats, barley, corn, brown rice, bulgar, farro, millet, polenta, couscous  - Various types of beans include chickpeas, lentils, fava beans, black beans, white beans  Fruits Veggies - Large quantities of antioxidant rich fruits & veggies; 6 or more servings  - Vegetables can be eaten raw or lightly drizzled with oil and cooked  - Vegetables common to the traditional Mediterranean Diet include: artichokes, arugula, beets, broccoli, brussel sprouts, cabbage, carrots, celery, collard greens, cucumbers, eggplant, kale, leeks, lemons, lettuce, mushrooms, okra, onions, peas, peppers, potatoes,  pumpkin, radishes, rutabaga, shallots, spinach, sweet potatoes, turnips, zucchini - Fruits common to the Mediterranean Diet include: apples, apricots, avocados, cherries, clementines, dates, figs, grapefruits, grapes, melons, nectarines, oranges, peaches, pears, pomegranates, strawberries, tangerines Fats - Replace butter and margarine with healthy oils, such as olive oil, canola oil, and tahini  - Limit nuts to no more than a handful a day  - Nuts include walnuts, almonds, pecans, pistachios, pine nuts  - Limit or avoid candied, honey roasted or heavily salted nuts - Olives are central to the [employer] - can be eaten whole or used in a variety of dishes  Meats Protein - Limiting red meat: no more than a few times a month - When eating red meat: choose lean cuts and keep the portion to the size of deck of cards - Eggs: approx. 0 to 4 times a week  - Fish and lean poultry: at least 2 a week  - Healthy protein sources include, chicken, [place], lean beef, lamb - Increase intake of seafood such as tuna, salmon, trout, mackerel, shrimp, scallops - Avoid or limit high fat processed meats such as sausage and bacon Dairy - Include moderate amounts of low fat dairy products  - Focus on healthy dairy such as fat free yogurt, skim milk, low or reduced fat cheese - Limit dairy products higher in fat such as whole or 2% milk, cheese, ice cream  Alcohol - Moderate amounts of red wine is ok  - No more than 5 oz daily for women (all ages) and men older than age 26  - No more than 10 oz of wine daily for men younger than 27 Other - Limit sweets and  other desserts  - Use herbs and spices instead of salt to flavor foods  - Herbs and spices common to the traditional Mediterranean Diet include: basil, bay leaves, chives, cloves, cumin, fennel, garlic, lavender, marjoram, mint, oregano, parsley, pepper, rosemary, sage, savory, sumac, tarragon, thyme  It's not just a diet, it's a lifestyle:  The  Mediterranean diet includes lifestyle factors typical of those in the region  Foods, drinks and meals are best eaten with others and savored Daily physical activity is important for overall good health This could be strenuous exercise like running and aerobics This could also be more leisurely activities such as walking, housework, yard-work, or taking the stairs Moderation is the key; a balanced and healthy diet accommodates most foods and drinks Consider portion sizes and frequency of consumption of certain foods   Meal Ideas & Options:  Breakfast:  Whole wheat toast or whole wheat English muffins with peanut butter & hard boiled egg Steel cut oats topped with apples & cinnamon and skim milk  Fresh fruit: banana, strawberries, melon, berries, peaches  Smoothies: strawberries, bananas, greek yogurt, peanut butter Low fat greek yogurt with blueberries and granola  Egg white omelet with spinach and mushrooms Breakfast couscous: whole wheat couscous, apricots, skim milk, cranberries  Sandwiches:  Hummus and grilled vegetables (peppers, zucchini, squash) on whole wheat bread  Grilled chicken on whole wheat pita with lettuce, tomatoes, cucumbers or tzatziki  Jordan salad on whole wheat bread: tuna salad made with greek yogurt, olives, red peppers, capers, green onions Garlic rosemary lamb pita: lamb sauted with garlic, rosemary, salt & pepper; add lettuce, cucumber, greek yogurt to pita - flavor with lemon juice and black pepper  Seafood:  Mediterranean grilled salmon, seasoned with garlic, basil, parsley, lemon juice and black pepper Shrimp, lemon, and spinach whole-grain pasta salad made with low fat greek yogurt  Seared scallops with lemon orzo  Seared tuna steaks seasoned salt, pepper, coriander topped with tomato mixture of olives, tomatoes, olive oil, minced garlic, parsley, green onions and cappers  Meats:  Herbed greek chicken salad with kalamata olives, cucumber, feta  Red bell  peppers stuffed with spinach, bulgur, lean ground beef (or lentils) & topped with feta  Kebabs: skewers of chicken, tomatoes, onions, zucchini, squash  [place] burgers: made with red onions, mint, dill, lemon juice, feta cheese topped with roasted red peppers Vegetarian Cucumber salad: cucumbers, artichoke hearts, celery, red onion, feta cheese, tossed in olive oil & lemon juice  Hummus and whole grain pita points with a greek salad (lettuce, tomato, feta, olives, cucumbers, red onion) Lentil soup with celery, carrots made with vegetable broth, garlic, salt and pepper  Tabouli salad: parsley, bulgur, mint, scallions, cucumbers, tomato, radishes, lemon juice, olive oil, salt and pepper.   Copy to pt. ______

## 2014-01-04 ENCOUNTER — Encounter: Payer: Self-pay | Admitting: Radiology

## 2014-01-05 ENCOUNTER — Other Ambulatory Visit: Payer: Self-pay | Admitting: Family Medicine

## 2014-01-05 DIAGNOSIS — M2550 Pain in unspecified joint: Secondary | ICD-10-CM

## 2014-01-05 DIAGNOSIS — M25519 Pain in unspecified shoulder: Secondary | ICD-10-CM

## 2014-01-14 DIAGNOSIS — M75120 Complete rotator cuff tear or rupture of unspecified shoulder, not specified as traumatic: Secondary | ICD-10-CM | POA: Diagnosis not present

## 2014-01-14 DIAGNOSIS — G5600 Carpal tunnel syndrome, unspecified upper limb: Secondary | ICD-10-CM | POA: Diagnosis not present

## 2014-01-14 DIAGNOSIS — M25819 Other specified joint disorders, unspecified shoulder: Secondary | ICD-10-CM | POA: Diagnosis not present

## 2014-01-14 DIAGNOSIS — M25559 Pain in unspecified hip: Secondary | ICD-10-CM | POA: Diagnosis not present

## 2014-01-28 DIAGNOSIS — M25819 Other specified joint disorders, unspecified shoulder: Secondary | ICD-10-CM | POA: Diagnosis not present

## 2014-01-28 DIAGNOSIS — G5600 Carpal tunnel syndrome, unspecified upper limb: Secondary | ICD-10-CM | POA: Diagnosis not present

## 2014-01-30 ENCOUNTER — Encounter: Payer: Self-pay | Admitting: Family Medicine

## 2014-01-31 DIAGNOSIS — M25531 Pain in right wrist: Secondary | ICD-10-CM | POA: Diagnosis not present

## 2014-02-05 DIAGNOSIS — M25511 Pain in right shoulder: Secondary | ICD-10-CM | POA: Diagnosis not present

## 2014-02-06 ENCOUNTER — Ambulatory Visit (INDEPENDENT_AMBULATORY_CARE_PROVIDER_SITE_OTHER): Payer: BC Managed Care – PPO | Admitting: Family Medicine

## 2014-02-06 VITALS — BP 136/90 | HR 92 | Temp 98.6°F | Resp 20

## 2014-02-06 DIAGNOSIS — R1013 Epigastric pain: Secondary | ICD-10-CM

## 2014-02-06 DIAGNOSIS — R42 Dizziness and giddiness: Secondary | ICD-10-CM

## 2014-02-06 DIAGNOSIS — I440 Atrioventricular block, first degree: Secondary | ICD-10-CM

## 2014-02-06 LAB — COMPREHENSIVE METABOLIC PANEL
ALT: 15 U/L (ref 0–53)
AST: 16 U/L (ref 0–37)
Albumin: 3.9 g/dL (ref 3.5–5.2)
Alkaline Phosphatase: 57 U/L (ref 39–117)
BILIRUBIN TOTAL: 0.6 mg/dL (ref 0.2–1.2)
BUN: 13 mg/dL (ref 6–23)
CO2: 27 mEq/L (ref 19–32)
CREATININE: 0.97 mg/dL (ref 0.50–1.35)
Calcium: 9.3 mg/dL (ref 8.4–10.5)
Chloride: 102 mEq/L (ref 96–112)
Glucose, Bld: 130 mg/dL — ABNORMAL HIGH (ref 70–99)
Potassium: 4.2 mEq/L (ref 3.5–5.3)
Sodium: 137 mEq/L (ref 135–145)
Total Protein: 6.2 g/dL (ref 6.0–8.3)

## 2014-02-06 LAB — POCT CBC
Granulocyte percent: 66.6 %G (ref 37–80)
HCT, POC: 43.3 % — AB (ref 43.5–53.7)
Hemoglobin: 14.1 g/dL (ref 14.1–18.1)
LYMPH, POC: 1.6 (ref 0.6–3.4)
MCH, POC: 29.2 pg (ref 27–31.2)
MCHC: 32.6 g/dL (ref 31.8–35.4)
MCV: 89.6 fL (ref 80–97)
MID (CBC): 0.7 (ref 0–0.9)
MPV: 9.2 fL (ref 0–99.8)
POC GRANULOCYTE: 4.6 (ref 2–6.9)
POC LYMPH %: 22.8 % (ref 10–50)
POC MID %: 10.6 % (ref 0–12)
Platelet Count, POC: 236 10*3/uL (ref 142–424)
RBC: 4.84 M/uL (ref 4.69–6.13)
RDW, POC: 13.5 %
WBC: 6.9 10*3/uL (ref 4.6–10.2)

## 2014-02-06 LAB — GLUCOSE, POCT (MANUAL RESULT ENTRY): POC Glucose: 124 mg/dl — AB (ref 70–99)

## 2014-02-06 MED ORDER — GI COCKTAIL ~~LOC~~
30.0000 mL | Freq: Once | ORAL | Status: AC
  Administered 2014-02-06: 30 mL via ORAL

## 2014-02-06 NOTE — Progress Notes (Addendum)
[facility] [address] [place] [address] [phone]  Date:  02/06/2014   Name:  Marvin Chandler   DOB:  1950/08/25   MRN:  [id number]  PCP:  Ruben Reason, MD    Chief Complaint: Dizziness, Nausea, Diarrhea and Emesis   History of Present Illness:  Marvin Chandler is a 63 y.o. very pleasant male patient who presents with the following:  He is here today with illness since this am.  He woke up to use the restroom but felt dizzy and lightheaded.  He started having diarrhea this am, and had a little bit of vomiting; 2 episodes.  He had diarrhea 2 or 3 times.   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  However he does not notice any other numbness or weakness.   He noted some pain in his left side earlier today- this is now better.  He indicates the epigastric area.  He is generally in good health.   Yesterday he felt fine.  He does have a cough which he thinks is due to asthma- he is bringing up phlegm when he coughs.  He has noted this for "a few months. "   No history of heart trouble. He did have some chest pain a couple of years ago and had a negative stress myoview and reassuring echo.     Patient Active Problem List   Diagnosis Date Noted  . Personal history of colonic adenomas 02/20/2013  . Dyspnea 12/16/2011  . Chest pain 12/16/2011  . ASTHMA 07/11/2006  . HIATAL HERNIA 07/11/2006  . OSTEOARTHRITIS 07/11/2006  . ARTHRITIS, HIPS, BILATERAL 07/11/2006    Past Medical History  Diagnosis Date  . Asthma   . Cough   . Hiatal hernia   . Osteoarthritis   . Arthritis of hip     bilateral  . Personal history of colonic adenomas 02/20/2013  . Pneumonia     Past Surgical History  Procedure Laterality Date  . Total hip arthroplasty  2002    bilateral  . Colonoscopy  02/13/13    History  Substance Use Topics  . Smoking status: Former Smoker -- 1.00 packs/day for 7 years    Types: Cigarettes    Quit date: 11/05/1997   . Smokeless tobacco: Never Used  . Alcohol Use: No    Family History  Problem Relation Age of Onset  . Dementia Mother   . Colon cancer Neg Hx   . Esophageal cancer Neg Hx     Not on File  Medication list has been reviewed and updated.  Current Outpatient Prescriptions on File Prior to Visit  Medication Sig Dispense Refill  . albuterol (PROVENTIL HFA) 108 (90 BASE) MCG/ACT inhaler Inhale 2 puffs into the lungs every 4 (four) hours as needed for wheezing or shortness of breath.  18 g  12  . albuterol (PROVENTIL) (2.5 MG/3ML) 0.083% nebulizer solution Take 3 mLs (2.5 mg total) by nebulization every 6 (six) hours as needed for wheezing or shortness of breath.  300 mL  12  . diclofenac (VOLTAREN) 75 MG EC tablet TAKE 1 TABLET BY MOUTH TWICE DAILY.  60 tablet  3  . Fluticasone-Salmeterol (ADVAIR DISKUS) 250-50 MCG/DOSE AEPB Inhale 1 puff into the lungs 2 (two) times daily.  60 each  12  . montelukast (SINGULAIR) 10 MG tablet Take 1 tablet (10 mg total) by mouth at bedtime.  30 tablet  11  . Nebulizers (NEBULIZER COMPRESSOR)  MISC Use nebulizer qid as needed  1 each  0  . omeprazole (PRILOSEC) 40 MG capsule Take 1 capsule by mouth every morning on an empty stomach. Take 30 minutes before eating.  30 capsule  3  . Acetaminophen-Guaifenesin (THERAFLU FLU/CHEST CONGESTION PO) Take 1 Package by mouth every 8 (eight) hours as needed (cough, cold).      Marland Kitchen aspirin 81 MG chewable tablet Chew 162 mg by mouth once.       No current facility-administered medications on file prior to visit.    Review of Systems:  As per HPI- otherwise negative.   Physical Examination: Filed Vitals:   02/06/14 1415  BP: 142/96  Pulse: 92  Temp: 98.6 F (37 C)  Resp: 20   Filed Vitals:   Cannot calculate BMI with a height equal to zero. Ideal Body Weight:    GEN: WDWN, NAD, Non-toxic, A & O x 3, looks well, large build.  Pt estimates he weights 220lbs.  Here with his SO today HEENT: Atraumatic,  Normocephalic. Neck supple. No masses, No LAD. Ears and Nose: No external deformity. CV: RRR, No M/G/R. No JVD. No thrill. No extra heart sounds. PULM: CTA B, no wheezes, crackles, rhonchi. No retractions. No resp. distress. No accessory muscle use. ABD: S, ND, +BS. No rebound. No HSM.  He has epigastric TTP on exam EXTR: No c/c/e NEURO Normal gait.  PSYCH: Normally interactive. Conversant. Not depressed or anxious appearing.  Calm demeanor.  Given IV fluids for hydration Did complete neuro exam after IV fluids.  Normal strength, sensation and DTR all extremities, normal strength and sensation of facial muscles. Negative romberg.    Results for orders placed in visit on 02/06/14  POCT CBC      Result Value Ref Range   WBC 6.9  4.6 - 10.2 K/uL   Lymph, poc 1.6  0.6 - 3.4   POC LYMPH PERCENT 22.8  10 - 50 %L   MID (cbc) 0.7  0 - 0.9   POC MID % 10.6  0 - 12 %M   POC Granulocyte 4.6  2 - 6.9   Granulocyte percent 66.6  37 - 80 %G   RBC 4.84  4.69 - 6.13 M/uL   Hemoglobin 14.1  14.1 - 18.1 g/dL   HCT, POC 43.3 (*) 43.5 - 53.7 %   MCV 89.6  80 - 97 fL   MCH, POC 29.2  27 - 31.2 pg   MCHC 32.6  31.8 - 35.4 g/dL   RDW, POC 13.5     Platelet Count, POC 236  142 - 424 K/uL   MPV 9.2  0 - 99.8 fL  GLUCOSE, POCT (MANUAL RESULT ENTRY)      Result Value Ref Range   POC Glucose 124 (*) 70 - 99 mg/dl    GI cocktail relieved his abdominal pain completly  EKG: EKG:  SR with occasional PACs and a 1st degree AV block.  When compared to EKG from 2013 do not note any significant change.   Assessment and Plan: Lightheadedness - Plan: POCT CBC, POCT glucose (manual entry), Comprehensive metabolic panel, EKG 27-CWCB  Abdominal pain, epigastric - Plan: gi cocktail (Maalox,Lidocaine,Donnatal)  Discussed in detail with pt and his companion today.  Governor's symptoms are non- specific.  Certainly the most common and likely reason for his sx is dehydration from GI illness. He does feel better after  IVF.  Do not suspect that his "abdominal pain" was actually chest pain because it was  reproducible on exam and resolved after GI cocktail.  Also do not see any sign of acute stroke.  However explained that sometimes more serious conditions such as heart attack or stroke can present with vague or seemingly minor sx.  I am happy to arrange a transfer to the ED for further evaluation.  At this time he declines further evaluation, but will seek care if his sx come back.  I will also refer to cardiology for follow-up.  It is possible that his 1st degree block is causing bradycardia symptoms.    Signed Lamar Blinks, MD  10/9: called to check on him, he is feeling better today.  Had a little vomiting this am and dizziness nearly resolved.  He will let us know if not continuing to improve

## 2014-02-06 NOTE — Patient Instructions (Signed)
Rest and eat a bland meal tonight.  If you have any more severe symptoms please go to the ER.  Otherwise I will touch base with you tomorrow to see how you are doing.  We will also get you back in to see cardiology to evaluate your heart rhythm further.

## 2014-02-07 ENCOUNTER — Encounter: Payer: Self-pay | Admitting: Family Medicine

## 2014-02-13 ENCOUNTER — Telehealth: Payer: Self-pay

## 2014-02-13 NOTE — Telephone Encounter (Signed)
Pt has some questions about possible additional medications, states that symptoms have gotten much worse, please advise pt

## 2014-02-13 NOTE — Telephone Encounter (Signed)
Attempted to call pt, no answer.  No voice mail to leave message

## 2014-02-16 ENCOUNTER — Ambulatory Visit (INDEPENDENT_AMBULATORY_CARE_PROVIDER_SITE_OTHER): Payer: BC Managed Care – PPO | Admitting: Physician Assistant

## 2014-02-16 VITALS — BP 138/76 | HR 81 | Temp 98.7°F | Resp 18 | Ht 70.5 in | Wt 224.6 lb

## 2014-02-16 DIAGNOSIS — R062 Wheezing: Secondary | ICD-10-CM

## 2014-02-16 DIAGNOSIS — J988 Other specified respiratory disorders: Secondary | ICD-10-CM

## 2014-02-16 DIAGNOSIS — J22 Unspecified acute lower respiratory infection: Secondary | ICD-10-CM

## 2014-02-16 DIAGNOSIS — J45901 Unspecified asthma with (acute) exacerbation: Secondary | ICD-10-CM

## 2014-02-16 DIAGNOSIS — R0989 Other specified symptoms and signs involving the circulatory and respiratory systems: Secondary | ICD-10-CM

## 2014-02-16 MED ORDER — PREDNISONE 20 MG PO TABS: ORAL_TABLET | ORAL | Status: DC

## 2014-02-16 MED ORDER — GUAIFENESIN ER 1200 MG PO TB12: 1.0000 | ORAL_TABLET | Freq: Two times a day (BID) | ORAL | Status: DC | PRN

## 2014-02-16 MED ORDER — ALBUTEROL SULFATE (2.5 MG/3ML) 0.083% IN NEBU
2.5000 mg | INHALATION_SOLUTION | Freq: Once | RESPIRATORY_TRACT | Status: AC
  Administered 2014-02-16: 2.5 mg via RESPIRATORY_TRACT

## 2014-02-16 MED ORDER — IPRATROPIUM BROMIDE 0.02 % IN SOLN
0.5000 mg | Freq: Once | RESPIRATORY_TRACT | Status: AC
  Administered 2014-02-16: 0.5 mg via RESPIRATORY_TRACT

## 2014-02-16 MED ORDER — AZITHROMYCIN 250 MG PO TABS: ORAL_TABLET | ORAL | Status: DC

## 2014-02-16 NOTE — Progress Notes (Signed)
IDENTIFYING INFORMATION  Marvin Chandler / male / 27-Oct-1950 / 63 y.o. / MRN: [id number]  The patient has ASTHMA; HIATAL HERNIA; OSTEOARTHRITIS; ARTHRITIS, HIPS, BILATERAL; Dyspnea; Chest pain; and Personal history of colonic adenomas on his problem list.  SUBJECTIVE  Chief Complaint: Wheezing, Chest Pain, Cough and Night Sweats   History of present illness: His illness has been present for roughly ten days, and started of as what seemed like a cold. As of Tuesday he started having SOB and severe cough, and felt like he may need a Z-pack, given the advice of a family member who is a Marine scientist.  He has tried some Theraflu starting last week, which initially relieved his symptoms, but is offering only minimal relief at present. He has a long history of asthma, and is maintained on albuterol and inhaled steroids. He is prescribed albuterol nebulizes, but has not used them in two weeks.     The patient has a current medication list which includes the following prescription(s): acetaminophen-guaifenesin, albuterol, albuterol, aspirin, diclofenac, fluticasone-salmeterol, montelukast, nebulizer compressor, omeprazole, azithromycin, guaifenesin, and prednisone.  Marvin Chandler has No Known Allergies. and he  reports that he quit smoking about 16 years ago. His smoking use included Cigarettes. He has a 7 pack-year smoking history. He has never used smokeless tobacco. He reports that he does not drink alcohol or use illicit drugs.  The patient  has past surgical history that includes Total hip arthroplasty (2002) and Colonoscopy (02/13/13). and his family history includes Dementia in his mother. There is no history of Colon cancer or Esophageal cancer.  Review of Systems  Constitutional: Positive for fever (subjective), chills (mild) and malaise/fatigue. Negative for diaphoresis.  HENT: Negative for congestion.   Respiratory: Positive for cough, hemoptysis (blood tinged x 1 day), sputum production,  shortness of breath and wheezing. Negative for stridor.   Cardiovascular: Positive for chest pain (pleuritic). Negative for palpitations, orthopnea, claudication, leg swelling and PND.  Gastrointestinal: Negative.   Genitourinary: Negative.   Neurological: Negative.  Negative for weakness.   Patient did not show significant improvement with 1 round of Duoneb, as diffuse expiratory wheezing was still apparent on lung auscultation.   OBJECTIVE  Blood pressure 138/76, pulse 81, temperature 98.7 F (37.1 C), temperature source Oral, resp. rate 18, height 5' 10.5" (1.791 m), weight 224 lb 9.6 oz (101.878 kg), SpO2 95.00%.  Physical Exam  Constitutional: He is oriented to person, place, and time and well-developed, well-nourished, and in no distress.  HENT:  Head: Normocephalic.  Right Ear: Hearing, tympanic membrane, external ear and ear canal normal.  Left Ear: Hearing, tympanic membrane, external ear and ear canal normal.  Nose: Nose normal.  Mouth/Throat: Mucous membranes are normal. Posterior oropharyngeal erythema present. No oropharyngeal exudate, posterior oropharyngeal edema or tonsillar abscesses.  Pulmonary/Chest: No respiratory distress. He has wheezes (diffuce end expiratory wheezes in all lung field). He has no rales. He exhibits no tenderness.  Abdominal: Soft. Bowel sounds are normal.  Neurological: He is alert and oriented to person, place, and time.  Skin: Skin is warm and dry.  Psychiatric: Mood, memory, affect and judgment normal.    ASSESSMENT & PLAN  Wheezing - Plan: albuterol (PROVENTIL) (2.5 MG/3ML) 0.083% nebulizer solution 2.5 mg, ipratropium (ATROVENT) nebulizer solution 0.5 mg  Chest congestion - Plan: Guaifenesin (MUCINEX MAXIMUM STRENGTH) 1200 MG TB12  Asthma with acute exacerbation, unspecified asthma severity - Plan: predniSONE (DELTASONE) 20 MG tablet  Lower respiratory tract infection - Plan: azithromycin (ZITHROMAX) 250 MG  tablet  The patient was  instructed to come back to clinic in 48 hours if he is not improving, or immediately if he becomes worse.    Marvin Fendt, MS, PA-C [facility] 02/16/2014 5:32 PM

## 2014-02-16 NOTE — Progress Notes (Signed)
I have examined this patient along with Mr. Carlis Abbott, [place] and agree.

## 2014-02-18 NOTE — Telephone Encounter (Signed)
Pt RTC 10/18

## 2014-02-19 ENCOUNTER — Encounter: Payer: Self-pay | Admitting: Family Medicine

## 2014-02-19 ENCOUNTER — Ambulatory Visit (INDEPENDENT_AMBULATORY_CARE_PROVIDER_SITE_OTHER): Payer: BC Managed Care – PPO | Admitting: Family Medicine

## 2014-02-19 VITALS — BP 128/76 | HR 93 | Temp 98.3°F | Resp 16 | Ht 70.5 in | Wt 228.4 lb

## 2014-02-19 DIAGNOSIS — J4551 Severe persistent asthma with (acute) exacerbation: Secondary | ICD-10-CM

## 2014-02-19 DIAGNOSIS — J45909 Unspecified asthma, uncomplicated: Secondary | ICD-10-CM | POA: Insufficient documentation

## 2014-02-19 DIAGNOSIS — J4541 Moderate persistent asthma with (acute) exacerbation: Secondary | ICD-10-CM

## 2014-02-19 NOTE — Progress Notes (Addendum)
S:  This 63 y.o. AA male has moderately severe to severe persistent Asthma, requiring treatment at [facility] 2-3 days ago. He is finishing a Z-PAK and feels better. He has occasional nocturnal cough which awakens him early in the morning. Uses MDIs and is taking steroid dose pack. States this medication makes him feel sluggish. Denies fever/chills, loss of appetite, abnormal weight change, CP or tightness, SOB, wheezing, HA, dizziness or syncope.  Patient Active Problem List   Diagnosis Date Noted  . Asthma, severe persistent 02/19/2014  . Personal history of colonic adenomas 02/20/2013  . Dyspnea 12/16/2011  . Chest pain 12/16/2011  . HIATAL HERNIA 07/11/2006  . OSTEOARTHRITIS 07/11/2006  . ARTHRITIS, HIPS, BILATERAL 07/11/2006    Prior to Admission medications   Medication Sig Start Date End Date Taking? Authorizing Provider  Acetaminophen-Guaifenesin (THERAFLU FLU/CHEST CONGESTION PO) Take 1 Package by mouth every 8 (eight) hours as needed (cough, cold).   Yes Historical Provider, MD  albuterol (PROVENTIL HFA) 108 (90 BASE) MCG/ACT inhaler Inhale 2 puffs into the lungs every 4 (four) hours as needed for wheezing or shortness of breath. 11/26/13  Yes Roselee Culver, MD  albuterol (PROVENTIL) (2.5 MG/3ML) 0.083% nebulizer solution Take 3 mLs (2.5 mg total) by nebulization every 6 (six) hours as needed for wheezing or shortness of breath. 11/26/13  Yes Roselee Culver, MD  aspirin 81 MG chewable tablet Chew 162 mg by mouth once.   Yes Historical Provider, MD  azithromycin (ZITHROMAX) 250 MG tablet Take 2 tabs PO x 1 dose, then 1 tab PO QD x 4 days 02/16/14  Yes Kathlen Brunswick, PA-C  diclofenac (VOLTAREN) 75 MG EC tablet TAKE 1 TABLET BY MOUTH TWICE DAILY. 12/31/13  Yes Barton Fanny, MD  Fluticasone-Salmeterol (ADVAIR DISKUS) 250-50 MCG/DOSE AEPB Inhale 1 puff into the lungs 2 (two) times daily. 11/26/13  Yes Roselee Culver, MD  Guaifenesin [facility] MAXIMUM STRENGTH) 1200 MG TB12  Take 1 tablet (1,200 mg total) by mouth every 12 (twelve) hours as needed. 02/16/14  Yes Kathlen Brunswick, PA-C  montelukast (SINGULAIR) 10 MG tablet Take 1 tablet (10 mg total) by mouth at bedtime. 12/31/13  Yes Barton Fanny, MD  Nebulizers (NEBULIZER COMPRESSOR) MISC Use nebulizer qid as needed 11/26/13  Yes Roselee Culver, MD  omeprazole (PRILOSEC) 40 MG capsule Take 1 capsule by mouth every morning on an empty stomach. Take 30 minutes before eating. 12/31/13  Yes Barton Fanny, MD  predniSONE (DELTASONE) 20 MG tablet Take 3 PO QAM x3days, 2 PO QAM x3days, 1 PO QAM x3days 02/16/14  Yes Kathlen Brunswick, PA-C    ROS: As per HPI.  O: Filed Vitals:   02/19/14 1128  BP: 128/76  Pulse: 93  Temp: 98.3 F (36.8 C)  Resp: 16    GEN: In NAD; WN,WD. HENT: [place]/AT; EOMI w/ injected conj and clear sclerae. Ext ears/canals/TMs normal. Nasal mucosa red and boggy. Post ph erythematous w/o exudate or lesions. NECK: Supple w/o LAN. COR: RRR. LUNGS: CTA w/ distant BS; no wheezes or rhonchi. NEURO: A&O x 3; CNs intact. Nonfocal.  A/P: Asthma, moderate persistent, with acute exacerbation- Finish all medications as prescribed. Use MDIs and rinse after ADVAIR use. Return to clinic in early November for Flu vaccine. Advised about Lenna Gilford (recommend he return in Jan or Feb for this vaccine).

## 2014-02-19 NOTE — Patient Instructions (Signed)
Finish your medications as prescribed.  Return to get Flu vaccine in early November. You should have Prevnar13, a pneumonia vaccine that is a lower dose and is a newer vaccine. You also need to get the Pneumococcal (PP-23) or Pneumovax immunization at a later date due to your diagnosis of Asthma and because you have a history of pneumonia in the past. I would suggest you come back in January or February 2016 for the [place].

## 2014-03-18 DIAGNOSIS — M7541 Impingement syndrome of right shoulder: Secondary | ICD-10-CM | POA: Diagnosis not present

## 2014-03-18 DIAGNOSIS — G5601 Carpal tunnel syndrome, right upper limb: Secondary | ICD-10-CM | POA: Diagnosis not present

## 2014-04-01 ENCOUNTER — Ambulatory Visit (INDEPENDENT_AMBULATORY_CARE_PROVIDER_SITE_OTHER): Payer: BC Managed Care – PPO | Admitting: Internal Medicine

## 2014-04-01 ENCOUNTER — Encounter: Payer: Self-pay | Admitting: Internal Medicine

## 2014-04-01 VITALS — BP 110/70 | HR 77 | Ht 70.5 in | Wt 230.2 lb

## 2014-04-01 DIAGNOSIS — K219 Gastro-esophageal reflux disease without esophagitis: Secondary | ICD-10-CM | POA: Diagnosis not present

## 2014-04-01 DIAGNOSIS — R1314 Dysphagia, pharyngoesophageal phase: Secondary | ICD-10-CM | POA: Diagnosis not present

## 2014-04-01 NOTE — Progress Notes (Signed)
Patient ID: Marvin Chandler, male   DOB: 03/27/1951, 63 y.o.   MRN: [id number]     History of Present Illness: Marvin Chandler is a 63 year old male who has previously been seen by Dr. Carlean Purl for hemorrhoids. He was seen by Dr. Leward Quan on September 1 for GERD and difficulty swallowing solids. He had tried over-the-counter famotidine but despite this had breakthrough heartburn. He had also tried toms which provided minimal relief. He had no dysphagia to liquids. He denies ever having a globus sensation. He was started on omeprazole 40 mg once daily on September 1 by Dr. Leward Quan, and within several days had total resolution of his heartburn and dysphagia. He had been having a nocturnal cough, and this has cleared as well since starting on omeprazole. He does report that he frequently has to clear his throat. He has been using Singulair at bedtime. He has no epigastric pain, nausea, or vomiting. His appetite has been good and his weight has been stable.   Past Medical History  Diagnosis Date  . Asthma   . Cough   . Hiatal hernia   . Osteoarthritis   . Arthritis of hip     bilateral  . Personal history of colonic adenomas 02/20/2013  . Pneumonia     Past Surgical History  Procedure Laterality Date  . Total hip arthroplasty  2002    bilateral  . Colonoscopy  02/13/13   Family History  Problem Relation Age of Onset  . Dementia Mother   . Colon cancer Neg Hx   . Esophageal cancer Neg Hx    History  Substance Use Topics  . Smoking status: Former Smoker -- 1.00 packs/day for 7 years    Types: Cigarettes    Quit date: 11/05/1997  . Smokeless tobacco: Never Used  . Alcohol Use: No   Current Outpatient Prescriptions  Medication Sig Dispense Refill  . Acetaminophen-Guaifenesin (THERAFLU FLU/CHEST CONGESTION PO) Take 1 Package by mouth every 8 (eight) hours as needed (cough, cold).    Marland Kitchen albuterol (PROVENTIL HFA) 108 (90 BASE) MCG/ACT inhaler Inhale 2 puffs into the lungs every 4 (four) hours  as needed for wheezing or shortness of breath. 18 g 12  . albuterol (PROVENTIL) (2.5 MG/3ML) 0.083% nebulizer solution Take 3 mLs (2.5 mg total) by nebulization every 6 (six) hours as needed for wheezing or shortness of breath. 300 mL 12  . aspirin 81 MG chewable tablet Chew 162 mg by mouth once.    . diclofenac (VOLTAREN) 75 MG EC tablet TAKE 1 TABLET BY MOUTH TWICE DAILY. 60 tablet 3  . Fluticasone-Salmeterol (ADVAIR DISKUS) 250-50 MCG/DOSE AEPB Inhale 1 puff into the lungs 2 (two) times daily. 60 each 12  . Guaifenesin (MUCINEX MAXIMUM STRENGTH) 1200 MG TB12 Take 1 tablet (1,200 mg total) by mouth every 12 (twelve) hours as needed. 14 tablet 1  . montelukast (SINGULAIR) 10 MG tablet Take 1 tablet (10 mg total) by mouth at bedtime. 30 tablet 11  . Nebulizers (NEBULIZER COMPRESSOR) MISC Use nebulizer qid as needed 1 each 0  . omeprazole (PRILOSEC) 40 MG capsule Take 1 capsule by mouth every morning on an empty stomach. Take 30 minutes before eating. 30 capsule 3  . predniSONE (DELTASONE) 20 MG tablet Take 3 PO QAM x3days, 2 PO QAM x3days, 1 PO QAM x3days 18 tablet 0   No current facility-administered medications for this visit.   No Known Allergies    Review of Systems: Gen: Denies any fever, chills, sweats, anorexia, fatigue, weakness, malaise,  weight loss, and sleep disorder CV: Denies chest pain, angina, palpitations, syncope, orthopnea, PND, peripheral edema, and claudication. Resp: Denies dyspnea at rest, dyspnea with exercise, cough, sputum, wheezing, coughing up blood, and pleurisy. GI: Denies vomiting blood, jaundice, and fecal incontinence.   Denies dysphagia or odynophagia since on omeprazole. GU : Denies urinary burning, blood in urine, urinary frequency, urinary hesitancy, nocturnal urination, and urinary incontinence. MS: Denies joint pain, limitation of movement, and swelling, stiffness, low back pain, extremity pain. Denies muscle weakness, cramps, atrophy.  Derm: Denies rash,  itching, dry skin, hives, moles, warts, or unhealing ulcers.  Psych: Denies depression, anxiety, memory loss, suicidal ideation, hallucinations, paranoia, and confusion. Heme: Denies bruising, bleeding, and enlarged lymph nodes. Neuro:  Denies any headaches, dizziness, paresthesia Endo:  Denies any problems with DM, thyroid, adrenal    Physical Exam: General: Pleasant, well developed , pleasant black male in no acute distress Head: Normocephalic and atraumatic Eyes:  sclerae anicteric, conjunctiva pink  Ears: Normal auditory acuity Lungs: Clear throughout to auscultation Heart: Regular rate and rhythm Abdomen: Soft, non distended, non-tender. No masses, no hepatomegaly. Normal bowel sounds Musculoskeletal: Symmetrical with no gross deformities  Extremities: No edema  Neurological: Alert oriented x 4, grossly nonfocal Psychological:  Alert and cooperative. Normal mood and affect  Assessment and Recommendations: 63 year old male with a history of GERD who had some transient dysphagia referred for evaluation. His dysphagia may have indeed be due to some esophageal spasm, however since the dysphagia didn't last several months in duration he will be scheduled for an EGD to evaluate for esophagitis, Barrett's, gastritis, or strictures.The risks, benefits, and alternatives to endoscopy with possible biopsy and possible dilation were discussed with the patient and they consent to proceed. The procedure will be scheduled with Dr. Carlean Purl. Patient has been instructed to continue using his omeprazole 40 mg by mouth 30 minutes prior to breakfast. Follow-up recommendations will be made pending the findings of his upper endoscopy.    Simon Aaberg, Deloris Ping 04/01/2014, I also saw the patient and agree with this note. Gatha Mayer, MD, [facility]

## 2014-04-07 ENCOUNTER — Telehealth: Payer: Self-pay

## 2014-04-07 NOTE — Telephone Encounter (Signed)
PA needed for omeprazole. Pt has been using w/good results since 12/31/13. Prev failed famotidine/tums. Pt also has freq exacerbations of asthma. Completed PA on covermymeds. Pending.

## 2014-04-10 ENCOUNTER — Ambulatory Visit (INDEPENDENT_AMBULATORY_CARE_PROVIDER_SITE_OTHER): Payer: BC Managed Care – PPO | Admitting: Cardiovascular Disease

## 2014-04-10 ENCOUNTER — Encounter: Payer: Self-pay | Admitting: Internal Medicine

## 2014-04-10 ENCOUNTER — Encounter: Payer: Self-pay | Admitting: Cardiovascular Disease

## 2014-04-10 VITALS — BP 124/70 | HR 74 | Ht 70.5 in | Wt 225.1 lb

## 2014-04-10 DIAGNOSIS — R072 Precordial pain: Secondary | ICD-10-CM

## 2014-04-10 DIAGNOSIS — Z01810 Encounter for preprocedural cardiovascular examination: Secondary | ICD-10-CM

## 2014-04-10 NOTE — Telephone Encounter (Signed)
PA approved through 04/07/15. Notified pharm.

## 2014-04-10 NOTE — Progress Notes (Signed)
History of Present Illness: 63 yo AAM with history of asthma, hiatal hernia but no HLD, HTN or DM and no prior cardiac disease who is here today for cardiac follow up. I saw him August 2013 as a new patient for evaluation of chest pain and an abnormal EKG. He was seen recently by Dr. Everlene Farrier and his EKG showed poor R wave progression through the precordial leads. He told me that he has been having bad pains in his upper back and this radiated to his chest. This did not hurt when he moved his arm. He has had minimal chest pain over the last month. He does notice shortness of breath when he exercise. I arranged an exercise stress myoview that showed no ischemia. His LVEF was calculated at 43% but appeared better than that. Echo August 2013 with normal LVEF 50-55%, mild LVH.   He is feeling well. No chest pain or SOB.   Primary Care Physician: Ellsworth Lennox  Last Lipid Profile:Lipid Panel     Component Value Date/Time   CHOL 189 12/31/2013 1518   TRIG 209* 12/31/2013 1518   HDL 36* 12/31/2013 1518   CHOLHDL 5.3 12/31/2013 1518   VLDL 42* 12/31/2013 1518   LDLCALC 111* 12/31/2013 1518     Past Medical History  Diagnosis Date  . Asthma   . Cough   . Hiatal hernia   . Osteoarthritis   . Arthritis of hip     bilateral  . Personal history of colonic adenomas 02/20/2013  . Pneumonia     Past Surgical History  Procedure Laterality Date  . Total hip arthroplasty  2002    bilateral  . Colonoscopy  02/13/13    Current Outpatient Prescriptions  Medication Sig Dispense Refill  . Acetaminophen-Guaifenesin (THERAFLU FLU/CHEST CONGESTION PO) Take 1 Package by mouth every 8 (eight) hours as needed (cough, cold).    Marland Kitchen albuterol (PROVENTIL HFA) 108 (90 BASE) MCG/ACT inhaler Inhale 2 puffs into the lungs every 4 (four) hours as needed for wheezing or shortness of breath. 18 g 12  . albuterol (PROVENTIL) (2.5 MG/3ML) 0.083% nebulizer solution Take 3 mLs (2.5 mg total) by nebulization every  6 (six) hours as needed for wheezing or shortness of breath. 300 mL 12  . aspirin 81 MG chewable tablet Chew 162 mg by mouth daily.     . diclofenac (VOLTAREN) 75 MG EC tablet TAKE 1 TABLET BY MOUTH TWICE DAILY. 60 tablet 3  . Fluticasone-Salmeterol (ADVAIR DISKUS) 250-50 MCG/DOSE AEPB Inhale 1 puff into the lungs 2 (two) times daily. 60 each 12  . Guaifenesin (MUCINEX MAXIMUM STRENGTH) 1200 MG TB12 Take 1 tablet (1,200 mg total) by mouth every 12 (twelve) hours as needed. (Patient taking differently: Take 1 tablet by mouth every 12 (twelve) hours as needed (congestion). ) 14 tablet 1  . montelukast (SINGULAIR) 10 MG tablet Take 1 tablet (10 mg total) by mouth at bedtime. 30 tablet 11  . Nebulizers (NEBULIZER COMPRESSOR) MISC Use nebulizer qid as needed (Patient taking differently: Use nebulizer four times a day as needed for shortness of breath or wheezing) 1 each 0  . omeprazole (PRILOSEC) 40 MG capsule Take 1 capsule by mouth every morning on an empty stomach. Take 30 minutes before eating. 30 capsule 3   No current facility-administered medications for this visit.    No Known Allergies  History   Social History  . Marital Status: Single    Spouse Name: N/A    Number of Children:  2  . Years of Education: N/A   Occupational History  . 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 Smoking status: Former Smoker -- 1.00 packs/day for 7 years    Types: Cigarettes    Quit date: 11/05/1997  . Smokeless tobacco: Never Used  . Alcohol Use: No  . Drug Use: No  . Sexual Activity: Not on file   Other Topics Concern  . Not on file   Social History Narrative    Family History  Problem Relation Age of Onset  . Dementia Mother   . Colon cancer Neg Hx   . Esophageal cancer Neg Hx     Review of Systems:  As stated in the HPI and otherwise negative.   BP 124/70 mmHg  Pulse 74  Ht 5' 10.5" (1.791 m)  Wt 225 lb 1.9 oz (102.114 kg)  BMI 31.83 kg/m2  Physical  Examination: General: Well developed, well nourished, NAD HEENT: OP clear, mucus membranes moist SKIN: warm, dry. No rashes. Neuro: No focal deficits Musculoskeletal: Muscle strength 5/5 all ext Psychiatric: Mood and affect normal Neck: No JVD, no carotid bruits, no thyromegaly, no lymphadenopathy. Lungs:Clear bilaterally, no wheezes, rhonci, crackles Cardiovascular: Regular rate and rhythm. No murmurs, gallops or rubs. Abdomen:Soft. Bowel sounds present. Non-tender.  Extremities: No lower extremity edema. Pulses are 2 + in the bilateral DP/PT.  Assessment and Plan:   1. Chest pain: No recurrence. Stress test without ischemia 2013. Echo with normal LV function 2013.   2. Pre-operative cardiovascular examination: He has plans for upcoming rotator cuff surgery. He does not need further ischemic testing before his planned surgery.

## 2014-04-10 NOTE — Patient Instructions (Signed)
Your physician wants you to follow-up in:  12 months.  You will receive a reminder letter in the mail two months in advance. If you don't receive a letter, please call our office to schedule the follow-up appointment.   

## 2014-04-14 ENCOUNTER — Telehealth: Payer: Self-pay

## 2014-04-14 NOTE — Telephone Encounter (Signed)
Spoke to patient to remind him to get his flu shot.  He said he will do so.

## 2014-04-15 ENCOUNTER — Ambulatory Visit (AMBULATORY_SURGERY_CENTER): Payer: BC Managed Care – PPO | Admitting: Internal Medicine

## 2014-04-15 ENCOUNTER — Encounter: Payer: Self-pay | Admitting: Internal Medicine

## 2014-04-15 VITALS — BP 128/74 | HR 73 | Temp 97.8°F | Resp 16 | Ht 70.0 in | Wt 230.0 lb

## 2014-04-15 DIAGNOSIS — K449 Diaphragmatic hernia without obstruction or gangrene: Secondary | ICD-10-CM | POA: Diagnosis not present

## 2014-04-15 DIAGNOSIS — K219 Gastro-esophageal reflux disease without esophagitis: Secondary | ICD-10-CM | POA: Diagnosis not present

## 2014-04-15 DIAGNOSIS — R1310 Dysphagia, unspecified: Secondary | ICD-10-CM | POA: Diagnosis not present

## 2014-04-15 DIAGNOSIS — J45909 Unspecified asthma, uncomplicated: Secondary | ICD-10-CM | POA: Diagnosis not present

## 2014-04-15 MED ORDER — SODIUM CHLORIDE 0.9 % IV SOLN: 500.0000 mL | INTRAVENOUS | Status: DC

## 2014-04-15 NOTE — Progress Notes (Signed)
Pt HOB elevated suctioned large amount sputum, 100 % 02 with Sao2 99 % Stable to RR

## 2014-04-15 NOTE — Patient Instructions (Addendum)
Nothing bad here - just a small hiatal hernia. Stomach slips up into the chest from abdominal cavity. Very common and not dangerous. This can be associated with acid reflux. Please stay on the omeprazole.  I appreciate the opportunity to care for you. Gatha Mayer, MD, FACG    YOU HAD AN ENDOSCOPIC PROCEDURE TODAY AT [facility]: Refer to the procedure report that was given to you for any specific questions about what was found during the examination.  If the procedure report does not answer your questions, please call your gastroenterologist to clarify.  If you requested that your care partner not be given the details of your procedure findings, then the procedure report has been included in a sealed envelope for you to review at your convenience later.  YOU SHOULD EXPECT: Some feelings of bloating in the abdomen. Passage of more gas than usual.  Walking can help get rid of the air that was put into your GI tract during the procedure and reduce the bloating. If you had a lower endoscopy (such as a colonoscopy or flexible sigmoidoscopy) you may notice spotting of blood in your stool or on the toilet paper. If you underwent a bowel prep for your procedure, then you may not have a normal bowel movement for a few days.  DIET: Your first meal following the procedure should be a light meal and then it is ok to progress to your normal diet.  A half-sandwich or bowl of soup is an example of a good first meal.  Heavy or fried foods are harder to digest and may make you feel nauseous or bloated.  Likewise meals heavy in dairy and vegetables can cause extra gas to form and this can also increase the bloating.  Drink plenty of fluids but you should avoid alcoholic beverages for 24 hours.  ACTIVITY: Your care partner should take you home directly after the procedure.  You should plan to take it easy, moving slowly for the rest of the day.  You can resume normal activity the day after  the procedure however you should NOT DRIVE or use heavy machinery for 24 hours (because of the sedation medicines used during the test).    SYMPTOMS TO REPORT IMMEDIATELY: A gastroenterologist can be reached at any hour.  During normal business hours, 8:30 AM to 5:00 PM Monday through Friday, call [phone].  After hours and on weekends, please call the GI answering service at [phone] who will take a message and have the physician on call contact you.   Following upper endoscopy (EGD)  Vomiting of blood or coffee ground material  New chest pain or pain under the shoulder blades  Painful or persistently difficult swallowing  New shortness of breath  Fever of 100F or higher  Black, tarry-looking stools  FOLLOW UP: If any biopsies were taken you will be contacted by phone or by letter within the next 1-3 weeks.  Call your gastroenterologist if you have not heard about the biopsies in 3 weeks.  Our staff will call the home number listed on your records the next business day following your procedure to check on you and address any questions or concerns that you may have at that time regarding the information given to you following your procedure. This is a courtesy call and so if there is no answer at the home number and we have not heard from you through the emergency physician on call, we will assume that you have  returned to your regular daily activities without incident.  SIGNATURES/CONFIDENTIALITY: You and/or your care partner have signed paperwork which will be entered into your electronic medical record.  These signatures attest to the fact that that the information above on your After Visit Summary has been reviewed and is understood.  Full responsibility of the confidentiality of this discharge information lies with you and/or your care-partner.   Resume medications. Information given on Hiatal Hernia with discharge instructions.

## 2014-04-15 NOTE — Op Note (Signed)
[facility]  [address] [place], [address]   ENDOSCOPY PROCEDURE REPORT  PATIENT: Marvin Chandler, Marvin Chandler  MR#: [id number] BIRTHDATE: 04-23-1951 , 27  yrs. old GENDER: male ENDOSCOPIST: Gatha Mayer, MD, [facility] PROCEDURE DATE:  04/15/2014 PROCEDURE:  EGD, diagnostic ASA CLASS:     Class II INDICATIONS:  dysphagia and heartburn. MEDICATIONS: Propofol 120 mg IV TOPICAL ANESTHETIC: none  DESCRIPTION OF PROCEDURE: After the risks benefits and alternatives of the procedure were thoroughly explained, informed consent was obtained.  The LB [id number] endoscope was introduced through the mouth and advanced to the second portion of the duodenum , Without limitations.  The instrument was slowly withdrawn as the mucosa was fully examined.    STOMACH: A small sliding hiatal hernia was noted.   Otherwise normal EGD  Retroflexed views revealed no abnormalities.     The scope was then withdrawn from the patient and the procedure completed.  COMPLICATIONS: There were no immediate complications.  ENDOSCOPIC IMPRESSION: 1.   Small sliding hiatal hernia 2.   Otherwise normal EGD  RECOMMENDATIONS: Continue PPI Follow-up with primary care on a regular basis and me prn   eSigned:  Gatha Mayer, MD, [facility] 04/15/2014 10:09 AM    QM:[id number] Linna Darner, MD and The Patient

## 2014-04-16 ENCOUNTER — Other Ambulatory Visit: Payer: Self-pay | Admitting: Emergency Medicine

## 2014-04-16 ENCOUNTER — Telehealth: Payer: Self-pay | Admitting: *Deleted

## 2014-04-16 NOTE — Telephone Encounter (Signed)
  Follow up Call-  Call back number 04/15/2014 02/13/2013  Post procedure Call Back phone  # [phone]  Permission to leave phone message Yes Yes     Patient questions:  Do you have a fever, pain , or abdominal swelling? No. Pain Score  0 *  Have you tolerated food without any problems? Yes.    Have you been able to return to your normal activities? Yes.    Do you have any questions about your discharge instructions: Diet   No. Medications  No. Follow up visit  No.  Do you have questions or concerns about your Care? No.  Actions: * If pain score is 4 or above: No action needed, pain <4.

## 2014-07-15 ENCOUNTER — Emergency Department (HOSPITAL_COMMUNITY): Payer: BC Managed Care – PPO

## 2014-07-15 ENCOUNTER — Encounter (HOSPITAL_COMMUNITY): Payer: Self-pay | Admitting: Emergency Medicine

## 2014-07-15 ENCOUNTER — Inpatient Hospital Stay (HOSPITAL_COMMUNITY)
Admission: EM | Admit: 2014-07-15 | Discharge: 2014-07-22 | DRG: 189 | Disposition: A | Discharge status: Home / Self Care | Payer: BC Managed Care – PPO | Attending: Internal Medicine | Admitting: Internal Medicine

## 2014-07-15 DIAGNOSIS — J45901 Unspecified asthma with (acute) exacerbation: Secondary | ICD-10-CM | POA: Diagnosis present

## 2014-07-15 DIAGNOSIS — J9601 Acute respiratory failure with hypoxia: Secondary | ICD-10-CM | POA: Diagnosis not present

## 2014-07-15 DIAGNOSIS — I5033 Acute on chronic diastolic (congestive) heart failure: Secondary | ICD-10-CM | POA: Diagnosis not present

## 2014-07-15 DIAGNOSIS — J188 Other pneumonia, unspecified organism: Secondary | ICD-10-CM | POA: Diagnosis present

## 2014-07-15 DIAGNOSIS — R0600 Dyspnea, unspecified: Secondary | ICD-10-CM

## 2014-07-15 DIAGNOSIS — N179 Acute kidney failure, unspecified: Secondary | ICD-10-CM | POA: Diagnosis not present

## 2014-07-15 DIAGNOSIS — Z87891 Personal history of nicotine dependence: Secondary | ICD-10-CM | POA: Diagnosis not present

## 2014-07-15 DIAGNOSIS — R509 Fever, unspecified: Secondary | ICD-10-CM | POA: Diagnosis not present

## 2014-07-15 DIAGNOSIS — Z7982 Long term (current) use of aspirin: Secondary | ICD-10-CM

## 2014-07-15 DIAGNOSIS — M1990 Unspecified osteoarthritis, unspecified site: Secondary | ICD-10-CM | POA: Diagnosis present

## 2014-07-15 DIAGNOSIS — R05 Cough: Secondary | ICD-10-CM

## 2014-07-15 DIAGNOSIS — R069 Unspecified abnormalities of breathing: Secondary | ICD-10-CM | POA: Diagnosis not present

## 2014-07-15 DIAGNOSIS — R059 Cough, unspecified: Secondary | ICD-10-CM

## 2014-07-15 DIAGNOSIS — Z96643 Presence of artificial hip joint, bilateral: Secondary | ICD-10-CM | POA: Diagnosis present

## 2014-07-15 DIAGNOSIS — Z79899 Other long term (current) drug therapy: Secondary | ICD-10-CM

## 2014-07-15 DIAGNOSIS — R0781 Pleurodynia: Secondary | ICD-10-CM | POA: Diagnosis present

## 2014-07-15 DIAGNOSIS — Z23 Encounter for immunization: Secondary | ICD-10-CM

## 2014-07-15 DIAGNOSIS — M160 Bilateral primary osteoarthritis of hip: Secondary | ICD-10-CM | POA: Diagnosis present

## 2014-07-15 DIAGNOSIS — R0602 Shortness of breath: Secondary | ICD-10-CM | POA: Diagnosis not present

## 2014-07-15 DIAGNOSIS — Z86018 Personal history of other benign neoplasm: Secondary | ICD-10-CM

## 2014-07-15 DIAGNOSIS — R079 Chest pain, unspecified: Secondary | ICD-10-CM | POA: Diagnosis not present

## 2014-07-15 LAB — I-STAT CHEM 8, ED
BUN: 12 mg/dL (ref 6–23)
Calcium, Ion: 1.18 mmol/L (ref 1.13–1.30)
Chloride: 101 mmol/L (ref 96–112)
Creatinine, Ser: 1.1 mg/dL (ref 0.50–1.35)
Glucose, Bld: 121 mg/dL — ABNORMAL HIGH (ref 70–99)
HEMATOCRIT: 46 % (ref 39.0–52.0)
HEMOGLOBIN: 15.6 g/dL (ref 13.0–17.0)
Potassium: 3.5 mmol/L (ref 3.5–5.1)
SODIUM: 145 mmol/L (ref 135–145)
TCO2: 25 mmol/L (ref 0–100)

## 2014-07-15 LAB — CBC WITH DIFFERENTIAL/PLATELET
BASOS ABS: 0 10*3/uL (ref 0.0–0.1)
Basophils Relative: 0 % (ref 0–1)
Eosinophils Absolute: 0.1 10*3/uL (ref 0.0–0.7)
Eosinophils Relative: 1 % (ref 0–5)
HCT: 41.8 % (ref 39.0–52.0)
Hemoglobin: 13.9 g/dL (ref 13.0–17.0)
LYMPHS ABS: 2 10*3/uL (ref 0.7–4.0)
LYMPHS PCT: 27 % (ref 12–46)
MCH: 29.2 pg (ref 26.0–34.0)
MCHC: 33.3 g/dL (ref 30.0–36.0)
MCV: 87.8 fL (ref 78.0–100.0)
Monocytes Absolute: 0.3 10*3/uL (ref 0.1–1.0)
Monocytes Relative: 4 % (ref 3–12)
Neutro Abs: 5.1 10*3/uL (ref 1.7–7.7)
Neutrophils Relative %: 68 % (ref 43–77)
PLATELETS: 187 10*3/uL (ref 150–400)
RBC: 4.76 MIL/uL (ref 4.22–5.81)
RDW: 13.9 % (ref 11.5–15.5)
WBC: 7.5 10*3/uL (ref 4.0–10.5)

## 2014-07-15 LAB — I-STAT TROPONIN, ED: Troponin i, poc: 0.03 ng/mL (ref 0.00–0.08)

## 2014-07-15 MED ORDER — MAGNESIUM SULFATE 2 GM/50ML IV SOLN
2.0000 g | Freq: Once | INTRAVENOUS | Status: AC
Start: 2014-07-16 — End: 2014-07-16
  Administered 2014-07-16: 2 g via INTRAVENOUS
  Filled 2014-07-15: qty 50

## 2014-07-15 NOTE — ED Notes (Signed)
Pt c/o asthma flare up that started yesterday. Pt states he has taken his home breathing tx with no relief. Pt stated his chest started hurting today when his asthma flared up again today. Pt states he has a sharp pian in his chest that radiates to his back. EMS gave 2 breathing tx and 125mg  solumedrol IV en route. Pt 02sats 100% on arrival to ED.

## 2014-07-15 NOTE — ED Provider Notes (Signed)
CSN: [id number]     Arrival date & time 07/15/14  2203 History   First MD Initiated Contact with Patient 07/15/14 2228     Chief Complaint  Patient presents with  . Asthma  . Chest Pain     (Consider location/radiation/quality/duration/timing/severity/associated sxs/prior Treatment) HPI Comments: She with a history of asthma, states that the past 2 days.  He's had to increase the number of minute treatments that he's had prior to this.  He's had a cough.  He saw his primary care provider on Friday for routine physical examination, which was normal at that time.  He does not have a history of hypertension or cardiac disease.  He has a history of asthma and GERD.  Has not changed any activity.  He's not been febrile.  He's had no nasal congestion or URI symptoms.  No nausea, vomiting, recent trips, no leg swelling.  EMS gave the patient 125 mg of Solu-Medrol and 2 back-to-back albuterol treatments.  He was still wheezing on arrival to the emergency room.  Oxygen saturation is  at 100%  Patient is a 64 y.o. male presenting with asthma and chest pain. The history is provided by the patient.  Asthma This is a recurrent problem. The current episode started yesterday. The problem occurs intermittently. The problem has been gradually worsening. Associated symptoms include chest pain and coughing. Pertinent negatives include no congestion, fever, rash, sore throat or swollen glands. The symptoms are aggravated by exertion. Treatments tried: Nebulization treatments. The treatment provided no relief.  Chest Pain Associated symptoms: cough and shortness of breath   Associated symptoms: no fever     Past Medical History  Diagnosis Date  . Asthma   . Cough   . Hiatal hernia   . Osteoarthritis   . Arthritis of hip     bilateral  . Personal history of colonic adenomas 02/20/2013  . Pneumonia    Past Surgical History  Procedure Laterality Date  . Total hip arthroplasty  2002    bilateral  .  Colonoscopy  02/13/13   Family History  Problem Relation Age of Onset  . Dementia Mother   . Colon cancer Neg Hx   . Esophageal cancer Neg Hx    History  Substance Use Topics  . Smoking status: Former Smoker -- 1.00 packs/day for 7 years    Types: Cigarettes    Quit date: 11/05/1997  . Smokeless tobacco: Never Used  . Alcohol Use: No    Review of Systems  Constitutional: Negative for fever.  HENT: Negative for congestion, rhinorrhea and sore throat.   Respiratory: Positive for cough, shortness of breath and wheezing.   Cardiovascular: Positive for chest pain.  Skin: Negative for rash.  All other systems reviewed and are negative.     Allergies  Review of patient's allergies indicates no known allergies.  Home Medications   Prior to Admission medications   Medication Sig Start Date End Date Taking? Authorizing Provider  Acetaminophen-Guaifenesin (THERAFLU FLU/CHEST CONGESTION PO) Take 1 Package by mouth every 8 (eight) hours as needed (cough, cold).   Yes Historical Provider, MD  albuterol (PROVENTIL HFA) 108 (90 BASE) MCG/ACT inhaler Inhale 2 puffs into the lungs every 4 (four) hours as needed for wheezing or shortness of breath. 11/26/13  Yes Roselee Culver, MD  albuterol (PROVENTIL) (2.5 MG/3ML) 0.083% nebulizer solution Take 3 mLs (2.5 mg total) by nebulization every 6 (six) hours as needed for wheezing or shortness of breath. 11/26/13  Yes Roselee Culver,  MD  aspirin 81 MG chewable tablet Chew 162 mg by mouth daily.    Yes Historical Provider, MD  diclofenac (VOLTAREN) 75 MG EC tablet TAKE 1 TABLET BY MOUTH TWICE DAILY. 12/31/13  Yes Barton Fanny, MD  Fluticasone-Salmeterol (ADVAIR DISKUS) 250-50 MCG/DOSE AEPB Inhale 1 puff into the lungs 2 (two) times daily. 11/26/13  Yes Roselee Culver, MD  montelukast (SINGULAIR) 10 MG tablet Take 1 tablet (10 mg total) by mouth at bedtime. 12/31/13  Yes Barton Fanny, MD  omeprazole (PRILOSEC) 40 MG capsule Take 1  capsule by mouth every morning on an empty stomach. Take 30 minutes before eating. 12/31/13  Yes Barton Fanny, MD  Nebulizers (NEBULIZER COMPRESSOR) MISC Use nebulizer qid as needed Patient taking differently: Use nebulizer four times a day as needed for shortness of breath or wheezing 11/26/13   Roselee Culver, MD   BP 145/94 mmHg  Pulse 89  Temp(Src) 98.1 F (36.7 C) (Oral)  Resp 18  Ht 5\' 11"  (1.803 m)  Wt 225 lb (102.059 kg)  BMI 31.39 kg/m2  SpO2 96% Physical Exam  Constitutional: He is oriented to person, place, and time. He appears well-developed and well-nourished.  HENT:  Head: Normocephalic.  Eyes: Pupils are equal, round, and reactive to light.  Neck: Normal range of motion.  Cardiovascular: Normal rate and regular rhythm.   Pulmonary/Chest: Effort normal. No respiratory distress. He has wheezes.  Abdominal: Soft.  Musculoskeletal: Normal range of motion.  Neurological: He is alert and oriented to person, place, and time.  Skin: Skin is warm.  Vitals reviewed.   ED Course  Procedures (including critical care time) Labs Review Labs Reviewed  I-STAT CHEM 8, ED - Abnormal; Notable for the following:    Glucose, Bld 121 (*)    All other components within normal limits  CBC WITH DIFFERENTIAL/PLATELET  Randolm Idol, ED    Imaging Review Dg Chest 2 View  07/16/2014   CLINICAL DATA:  Chronic cough and mid upper chest pain. Congestion, shortness of breath and low-grade fever. Initial encounter.  EXAM: CHEST  2 VIEW  COMPARISON:  Chest radiograph performed 11/26/2013  FINDINGS: The lungs are well-aerated. Bibasilar and perihilar airspace opacity raises concern for mild pulmonary edema, though pneumonia might have a similar appearance. No definite pleural effusion or pneumothorax is seen.  The heart is normal in size; the mediastinal contour is within normal limits. No acute osseous abnormalities are seen.  IMPRESSION: Bibasilar and perihilar airspace opacity  raises concern for mild pulmonary edema, though pneumonia might have a similar appearance.   Electronically Signed   By: Garald Balding M.D.   On: 07/16/2014 00:34     EKG Interpretation   Date/Time:  Tuesday July 15 2014 22:13:38 EDT Ventricular Rate:  86 PR Interval:  215 QRS Duration: 87 QT Interval:  367 QTC Calculation: 439 R Axis:   60 Text Interpretation:  Sinus rhythm Borderline prolonged PR interval  Borderline T wave abnormalities Since previous tracing t wave inversions  less evident in inferior leads Confirmed by [facility]  MD, MARTHA [phone]) on  07/15/2014 10:15:41 PM     Despite multiple neb treatments 125 mg of Solu-Medrol and 2 g of magnesium.  Patient is still wheezing.  His oxygen saturation remains 92-93% and he still feels symptomatic like he can't get a good breath.  Chest x-ray shows slight edema versus pneumonia.  EKG is unchanged .  White count is normal MDM   Final diagnoses:  Cough  Asthma exacerbation         Junius Creamer, NP 07/16/14 4320  Alfonzo Beers, MD 07/16/14 [phone]

## 2014-07-16 ENCOUNTER — Encounter (HOSPITAL_COMMUNITY): Payer: Self-pay | Admitting: Internal Medicine

## 2014-07-16 DIAGNOSIS — R0781 Pleurodynia: Secondary | ICD-10-CM

## 2014-07-16 DIAGNOSIS — J45901 Unspecified asthma with (acute) exacerbation: Secondary | ICD-10-CM

## 2014-07-16 LAB — CBC
HCT: 42.5 % (ref 39.0–52.0)
Hemoglobin: 14 g/dL (ref 13.0–17.0)
MCH: 29.1 pg (ref 26.0–34.0)
MCHC: 32.9 g/dL (ref 30.0–36.0)
MCV: 88.4 fL (ref 78.0–100.0)
PLATELETS: 221 10*3/uL (ref 150–400)
RBC: 4.81 MIL/uL (ref 4.22–5.81)
RDW: 13.9 % (ref 11.5–15.5)
WBC: 6.8 10*3/uL (ref 4.0–10.5)

## 2014-07-16 LAB — RAPID URINE DRUG SCREEN, HOSP PERFORMED
Amphetamines: NOT DETECTED
Barbiturates: NOT DETECTED
Benzodiazepines: NOT DETECTED
COCAINE: NOT DETECTED
Opiates: NOT DETECTED
TETRAHYDROCANNABINOL: NOT DETECTED

## 2014-07-16 LAB — D-DIMER, QUANTITATIVE (NOT AT ARMC): D-Dimer, Quant: 0.66 ug/mL-FEU — ABNORMAL HIGH (ref 0.00–0.48)

## 2014-07-16 LAB — BASIC METABOLIC PANEL
Anion gap: 8 (ref 5–15)
BUN: 11 mg/dL (ref 6–23)
CO2: 28 mmol/L (ref 19–32)
Calcium: 9.1 mg/dL (ref 8.4–10.5)
Chloride: 104 mmol/L (ref 96–112)
Creatinine, Ser: 1.36 mg/dL — ABNORMAL HIGH (ref 0.50–1.35)
GFR calc non Af Amer: 53 mL/min — ABNORMAL LOW (ref >90)
GFR, EST AFRICAN AMERICAN: 61 mL/min — AB (ref >90)
Glucose, Bld: 260 mg/dL — ABNORMAL HIGH (ref 70–99)
POTASSIUM: 4.4 mmol/L (ref 3.5–5.1)
SODIUM: 140 mmol/L (ref 135–145)

## 2014-07-16 LAB — INFLUENZA PANEL BY PCR (TYPE A & B)
H1N1 flu by pcr: NOT DETECTED
INFLBPCR: NEGATIVE
Influenza A By PCR: NEGATIVE

## 2014-07-16 LAB — TROPONIN I
TROPONIN I: 0.04 ng/mL — AB (ref <0.031)
TROPONIN I: 0.05 ng/mL — AB (ref <0.031)
Troponin I: 0.04 ng/mL — ABNORMAL HIGH (ref <0.031)

## 2014-07-16 LAB — BRAIN NATRIURETIC PEPTIDE: B Natriuretic Peptide: 508.6 pg/mL — ABNORMAL HIGH (ref 0.0–100.0)

## 2014-07-16 MED ORDER — ALBUTEROL SULFATE (2.5 MG/3ML) 0.083% IN NEBU
2.5000 mg | INHALATION_SOLUTION | Freq: Four times a day (QID) | RESPIRATORY_TRACT | Status: DC
  Administered 2014-07-16 – 2014-07-21 (×16): 2.5 mg via RESPIRATORY_TRACT
  Filled 2014-07-16 (×17): qty 3

## 2014-07-16 MED ORDER — PNEUMOCOCCAL VAC POLYVALENT 25 MCG/0.5ML IJ INJ
0.5000 mL | INJECTION | INTRAMUSCULAR | Status: AC
  Administered 2014-07-17: 0.5 mL via INTRAMUSCULAR

## 2014-07-16 MED ORDER — PREDNISONE 50 MG PO TABS: 50.0000 mg | ORAL_TABLET | Freq: Every day | ORAL | Status: DC

## 2014-07-16 MED ORDER — ONDANSETRON HCL 4 MG PO TABS: 4.0000 mg | ORAL_TABLET | Freq: Four times a day (QID) | ORAL | Status: DC | PRN

## 2014-07-16 MED ORDER — SODIUM CHLORIDE 0.9 % IJ SOLN
3.0000 mL | Freq: Two times a day (BID) | INTRAMUSCULAR | Status: DC
  Administered 2014-07-16 – 2014-07-22 (×9): 3 mL via INTRAVENOUS

## 2014-07-16 MED ORDER — PANTOPRAZOLE SODIUM 40 MG PO TBEC
40.0000 mg | DELAYED_RELEASE_TABLET | Freq: Every day | ORAL | Status: DC
  Administered 2014-07-16 – 2014-07-22 (×7): 40 mg via ORAL
  Filled 2014-07-16 (×7): qty 1

## 2014-07-16 MED ORDER — METHYLPREDNISOLONE SODIUM SUCC 40 MG IJ SOLR: 40.0000 mg | Freq: Two times a day (BID) | INTRAMUSCULAR | Status: DC

## 2014-07-16 MED ORDER — ASPIRIN 81 MG PO CHEW
162.0000 mg | CHEWABLE_TABLET | Freq: Every day | ORAL | Status: DC
Start: 2014-07-16 — End: 2014-07-22
  Administered 2014-07-16 – 2014-07-22 (×7): 162 mg via ORAL
  Filled 2014-07-16 (×8): qty 2

## 2014-07-16 MED ORDER — ACETAMINOPHEN 325 MG PO TABS: 650.0000 mg | ORAL_TABLET | Freq: Four times a day (QID) | ORAL | Status: DC | PRN

## 2014-07-16 MED ORDER — METHYLPREDNISOLONE SODIUM SUCC 40 MG IJ SOLR
40.0000 mg | Freq: Every day | INTRAMUSCULAR | Status: DC
  Administered 2014-07-16: 40 mg via INTRAVENOUS
  Filled 2014-07-16: qty 1

## 2014-07-16 MED ORDER — ALBUTEROL SULFATE (2.5 MG/3ML) 0.083% IN NEBU: 2.5000 mg | INHALATION_SOLUTION | Freq: Two times a day (BID) | RESPIRATORY_TRACT | Status: DC

## 2014-07-16 MED ORDER — SODIUM CHLORIDE 0.9 % IV SOLN: INTRAVENOUS | Status: DC

## 2014-07-16 MED ORDER — ACETAMINOPHEN 650 MG RE SUPP: 650.0000 mg | Freq: Four times a day (QID) | RECTAL | Status: DC | PRN

## 2014-07-16 MED ORDER — ENOXAPARIN SODIUM 40 MG/0.4ML ~~LOC~~ SOLN
40.0000 mg | SUBCUTANEOUS | Status: DC
Start: 2014-07-16 — End: 2014-07-22
  Administered 2014-07-16 – 2014-07-21 (×6): 40 mg via SUBCUTANEOUS
  Filled 2014-07-16 (×6): qty 0.4

## 2014-07-16 MED ORDER — ALBUTEROL SULFATE (2.5 MG/3ML) 0.083% IN NEBU: 2.5000 mg | INHALATION_SOLUTION | RESPIRATORY_TRACT | Status: DC | PRN

## 2014-07-16 MED ORDER — ALBUTEROL SULFATE (2.5 MG/3ML) 0.083% IN NEBU
2.5000 mg | INHALATION_SOLUTION | RESPIRATORY_TRACT | Status: DC | PRN
  Administered 2014-07-16 – 2014-07-21 (×3): 2.5 mg via RESPIRATORY_TRACT
  Filled 2014-07-16 (×3): qty 3

## 2014-07-16 MED ORDER — DOXYCYCLINE HYCLATE 100 MG PO TABS
100.0000 mg | ORAL_TABLET | Freq: Two times a day (BID) | ORAL | Status: DC
  Administered 2014-07-16 – 2014-07-22 (×13): 100 mg via ORAL
  Filled 2014-07-16 (×13): qty 1

## 2014-07-16 MED ORDER — INFLUENZA VAC SPLIT QUAD 0.5 ML IM SUSY
0.5000 mL | PREFILLED_SYRINGE | INTRAMUSCULAR | Status: AC
  Administered 2014-07-17: 0.5 mL via INTRAMUSCULAR
  Filled 2014-07-16: qty 0.5

## 2014-07-16 MED ORDER — ONDANSETRON HCL 4 MG/2ML IJ SOLN: 4.0000 mg | Freq: Four times a day (QID) | INTRAMUSCULAR | Status: DC | PRN

## 2014-07-16 MED ORDER — ALBUTEROL SULFATE (2.5 MG/3ML) 0.083% IN NEBU
2.5000 mg | INHALATION_SOLUTION | RESPIRATORY_TRACT | Status: DC
  Administered 2014-07-16 (×2): 2.5 mg via RESPIRATORY_TRACT
  Filled 2014-07-16: qty 3

## 2014-07-16 MED ORDER — MONTELUKAST SODIUM 10 MG PO TABS
10.0000 mg | ORAL_TABLET | Freq: Every day | ORAL | Status: DC
  Administered 2014-07-16 – 2014-07-21 (×6): 10 mg via ORAL
  Filled 2014-07-16 (×6): qty 1

## 2014-07-16 MED ORDER — ALBUTEROL SULFATE (2.5 MG/3ML) 0.083% IN NEBU
2.5000 mg | INHALATION_SOLUTION | Freq: Once | RESPIRATORY_TRACT | Status: AC
  Administered 2014-07-16: 2.5 mg via RESPIRATORY_TRACT
  Filled 2014-07-16: qty 3

## 2014-07-16 MED ORDER — BUDESONIDE 0.5 MG/2ML IN SUSP
0.2500 mg | Freq: Two times a day (BID) | RESPIRATORY_TRACT | Status: DC
  Administered 2014-07-16: 0.5 mg via RESPIRATORY_TRACT
  Administered 2014-07-16 – 2014-07-17 (×3): 0.25 mg via RESPIRATORY_TRACT
  Filled 2014-07-16 (×4): qty 2

## 2014-07-16 NOTE — Progress Notes (Addendum)
Patient ID: Marvin Chandler, male   DOB: January 06, 1951, 64 y.o.   MRN: [id number]  [facility] PROGRESS NOTE  Marvin Chandler QQP:[id number] DOB: 1950/11/05 DOA: 07/15/2014 PCP: Ruben Reason, MD   Brief narrative:    64 y.o. male with a history of asthma, presented to [facility] ED with main concern of 2 days duration of progressively worsening dyspnea that initially started with exertion and progressed to dyspnea at rest over the past 24 hours, associated with productive cough of clear sputum and wheezing, chest tightness that is present with coughing spells. In ED, pt noted to be wheezing and has required nebulizer treatments multiple times, CXR concerning for developing PNA vs edema. TR[facility] asked to admit for further evaluation.   Assessment/Plan:    Principal Problem:   Acute hypoxic respiratory failure - secondary to acute asthma exacerbation with possible perihilar PNA - pt started on solumedrol and feels better already this AM - will continue steroids for now and taper down as clinically indicated - continue oral ABX doxycycline that was started in ED - continue to keep on oxygen via [place] - continue BD's scheduled and as needed  - d-dimer elevated, unable to do CT angio chest due to ARF but we can proceed with V/Q scan if needed  - since pt feels better on current regimen will hold off on further imaging for now as d-dimer likely elevated in the setting of acute process Active Problems:   Pleuritic chest pain - likely related to the above - provide analgesia as needed    Elevated troponin - possibly related to demand ischemia from principal problem - pt denies chest pain this am - keep on tele - cycle CE's - repeat 12 lead EKG    Acute renal failure - encourage PO intake and repeat BMP in AM  DVT prophylaxis: Lovenox SQ  Code Status: Full.  Family Communication:  plan of care discussed with the patient Disposition Plan: Home when stable.   IV access:  Peripheral IV  Procedures  and diagnostic studies:    CXR 07/16/2014 Bibasilar and perihilar airspace opacity ? pulmonary edema vs PNA    Medical Consultants:  None   Other Consultants:  None   IAnti-Infectives:   Doxycycline 3/16 -->  Faye Ramsay, MD  [facility] Pager [phone]  If 7PM-7AM, please contact night-coverage [email or web address] Password [facility] 07/16/2014, 4:12 PM     HPI/Subjective: No events overnight.   Objective: Filed Vitals:   07/16/14 0330 07/16/14 0404 07/16/14 0430 07/16/14 1332  BP: 145/95 131/67  136/87  Pulse: 92 90 87 95  Temp:  98.1 F (36.7 C)  98.2 F (36.8 C)  TempSrc:  Oral  Oral  Resp:  18 18 18   Height:  5\' 11"  (1.803 m)    Weight:  102.059 kg (225 lb)    SpO2: 91% 95% 94% 99%    Intake/Output Summary (Last 24 hours) at 07/16/14 1612 Last data filed at 07/16/14 1500  Gross per 24 hour  Intake    240 ml  Output    400 ml  Net   -160 ml    Exam:   General:  Pt is alert, follows commands appropriately, not in acute distress  Cardiovascular: Regular rate and rhythm, no rubs, no gallops  Respiratory: Course breath sounds bilaterally with expiratory wheezing   Abdomen: Soft, non tender, non distended, bowel sounds present, no guarding  Extremities: trace bilateral LE pitting, pulses DP and PT palpable bilaterally  Neuro: Grossly nonfocal  Data Reviewed: Basic  Metabolic Panel:  Recent Labs Lab 07/15/14 2316 07/16/14 0558  NA 145 140  K 3.5 4.4  CL 101 104  CO2  --  28  GLUCOSE 121* 260*  BUN 12 11  CREATININE 1.10 1.36*  CALCIUM  --  9.1   CBC:  Recent Labs Lab 07/15/14 2310 07/15/14 2316 07/16/14 0558  WBC 7.5  --  6.8  NEUTROABS 5.1  --   --   HGB 13.9 15.6 14.0  HCT 41.8 46.0 42.5  MCV 87.8  --  88.4  PLT 187  --  221   Cardiac Enzymes:  Recent Labs Lab 07/16/14 0558  TROPONINI 0.04*   Scheduled Meds: . albuterol  2.5 mg Nebulization BID  . aspirin  162 mg Oral Daily  . budesonide neb  0.25 mg Nebulization BID  .  doxycycline  100 mg Oral Q12H  . enoxaparin  injection  40 mg Subcutaneous Q24H  . methylPREDNISolone    40 mg Intravenous Daily  . montelukast  10 mg Oral QHS  . pantoprazole  40 mg Oral Daily   Continuous Infusions:

## 2014-07-16 NOTE — H&P (Signed)
[facility] History and Physical  BRAYLAN FAUL XNA:[id number] DOB: [birth year] DOA: 07/15/2014  Referring physician: ER physician. PCP: Ruben Reason, MD   Chief Complaint: Shortness of breath.  HPI: Marvin Chandler is a 64 y.o. male with a history of asthma presents to the ER because of shortness of breath and productive cough. In addition patient has been having some pleuritic-type of chest pain. Patient states he has been having shortness of breath over the last 2 days with increasing wheezing. Patient denies any exertional symptoms. In the ER patient was found to be wheezing and was given nebulizer and steroids and was mildly hypoxic. Patient also has been having some productive cough but denies any fever or chills. Patient's chest pain is only on deep breathing and coughing. Chest x-ray was showing possible infiltrates versus edema. On exam patient has mild wheezing but not in distress. Patient will be admitted for observation.  Review of Systems: As presented in the history of presenting illness, rest negative.  Past Medical History  Diagnosis Date  . Asthma   . Cough   . Hiatal hernia   . Osteoarthritis   . Arthritis of hip     bilateral  . Personal history of colonic adenomas 02/20/2013  . Pneumonia    Past Surgical History  Procedure Laterality Date  . Total hip arthroplasty  2002    bilateral  . Colonoscopy  02/13/13   Social History:  reports that he quit smoking about 16 years ago. His smoking use included Cigarettes. He has a 7 pack-year smoking history. He has never used smokeless tobacco. He reports that he does not drink alcohol or use illicit drugs. Where does patient live home. Can patient participate in ADLs? Yes.  No Known Allergies  Family History:  Family History  Problem Relation Age of Onset  . Dementia Mother   . Colon cancer Neg Hx   . Esophageal cancer Neg Hx       Prior to Admission medications   Medication Sig Start Date End Date  Taking? Authorizing Provider  Acetaminophen-Guaifenesin (THERAFLU FLU/CHEST CONGESTION PO) Take 1 Package by mouth every 8 (eight) hours as needed (cough, cold).   Yes Historical Provider, MD  albuterol (PROVENTIL HFA) 108 (90 BASE) MCG/ACT inhaler Inhale 2 puffs into the lungs every 4 (four) hours as needed for wheezing or shortness of breath. 11/26/13  Yes Roselee Culver, MD  albuterol (PROVENTIL) (2.5 MG/3ML) 0.083% nebulizer solution Take 3 mLs (2.5 mg total) by nebulization every 6 (six) hours as needed for wheezing or shortness of breath. 11/26/13  Yes Roselee Culver, MD  aspirin 81 MG chewable tablet Chew 162 mg by mouth daily.    Yes Historical Provider, MD  diclofenac (VOLTAREN) 75 MG EC tablet TAKE 1 TABLET BY MOUTH TWICE DAILY. 12/31/13  Yes Barton Fanny, MD  Fluticasone-Salmeterol (ADVAIR DISKUS) 250-50 MCG/DOSE AEPB Inhale 1 puff into the lungs 2 (two) times daily. 11/26/13  Yes Roselee Culver, MD  montelukast (SINGULAIR) 10 MG tablet Take 1 tablet (10 mg total) by mouth at bedtime. 12/31/13  Yes Barton Fanny, MD  omeprazole (PRILOSEC) 40 MG capsule Take 1 capsule by mouth every morning on an empty stomach. Take 30 minutes before eating. 12/31/13  Yes Barton Fanny, MD  Nebulizers (NEBULIZER COMPRESSOR) MISC Use nebulizer qid as needed Patient taking differently: Use nebulizer four times a day as needed for shortness of breath or wheezing 11/26/13   Roselee Culver, MD    Physical  Exam: Filed Vitals:   07/16/14 0200 07/16/14 0230 07/16/14 0300 07/16/14 0330  BP: 144/86 145/94 140/86 145/95  Pulse: 94 89 93 92  Temp:      TempSrc:      Resp: 17 18    Height:      Weight:      SpO2: 88% 96% 91% 91%     General:  Well-developed and nourished.  Eyes: Anicteric no pallor.  ENT: No discharge from the ears eyes nose and mouth.  Neck: No mass felt.  Cardiovascular: S1 and S2 heard.  Respiratory: Mild expiratory wheeze and no crepitations.  Abdomen:  Soft nontender bowel sounds present.  Skin: No rash.  Musculoskeletal: No edema.  Psychiatric: Appears normal.  Neurologic: Alert awake oriented to time place and person. Moves all extremities.  Labs on Admission:  Basic Metabolic Panel:  Recent Labs Lab 07/15/14 2316  NA 145  K 3.5  CL 101  GLUCOSE 121*  BUN 12  CREATININE 1.10   Liver Function Tests: No results for input(s): AST, ALT, ALKPHOS, BILITOT, PROT, ALBUMIN in the last 168 hours. No results for input(s): LIPASE, AMYLASE in the last 168 hours. No results for input(s): AMMONIA in the last 168 hours. CBC:  Recent Labs Lab 07/15/14 2310 07/15/14 2316  WBC 7.5  --   NEUTROABS 5.1  --   HGB 13.9 15.6  HCT 41.8 46.0  MCV 87.8  --   PLT 187  --    Cardiac Enzymes: No results for input(s): CKTOTAL, CKMB, CKMBINDEX, TROPONINI in the last 168 hours.  BNP (last 3 results) No results for input(s): BNP in the last 8760 hours.  ProBNP (last 3 results) No results for input(s): PROBNP in the last 8760 hours.  CBG: No results for input(s): GLUCAP in the last 168 hours.  Radiological Exams on Admission: Dg Chest 2 View  07/16/2014   CLINICAL DATA:  Chronic cough and mid upper chest pain. Congestion, shortness of breath and low-grade fever. Initial encounter.  EXAM: CHEST  2 VIEW  COMPARISON:  Chest radiograph performed 11/26/2013  FINDINGS: The lungs are well-aerated. Bibasilar and perihilar airspace opacity raises concern for mild pulmonary edema, though pneumonia might have a similar appearance. No definite pleural effusion or pneumothorax is seen.  The heart is normal in size; the mediastinal contour is within normal limits. No acute osseous abnormalities are seen.  IMPRESSION: Bibasilar and perihilar airspace opacity raises concern for mild pulmonary edema, though pneumonia might have a similar appearance.   Electronically Signed   By: Garald Balding M.D.   On: 07/16/2014 00:34    EKG: Independently reviewed.  Normal sinus rhythm with nonspecific T-wave changes.  Assessment/Plan Principal Problem:   Asthma exacerbation Active Problems:   Pleuritic chest pain   1. Asthma exacerbation - at this time we will continue with IV steroids Pulmicort nebulizer. Since patient has productive cough and chest x-ray showing possible infiltrates I have placed patient on doxycycline. Check influenza PCR. Since there is concern for edema I have ordered a BNP. 2. Pleuritic type of chest pain - patient chest pain is only present on deep inspiration and cough. Patient has had a normal stress test in 2013. We will cycle cardiac markers and check d-dimer.    DVT Prophylaxis Lovenox.  Code Status: Full code.  Family Communication: Family at the bedside.  Disposition Plan: Admit for observation.    Charina Fons N. [facility] Pager [phone].  If 7PM-7AM, please contact night-coverage [email or web address] Password [facility] 07/16/2014, 3:54  AM        

## 2014-07-16 NOTE — Progress Notes (Signed)
UR completed 

## 2014-07-16 NOTE — ED Notes (Signed)
Paged [facility]

## 2014-07-16 NOTE — ED Notes (Addendum)
Pt ambulated one lap around pod. Oxygen sats 99% prior to ambulating. Sats at 94% after returning to room. Pt c/o fatigue after ambulation.

## 2014-07-16 NOTE — Progress Notes (Signed)
Patient ambulated 772ft without oxygen. Saturation level 98-100% on room air, no wheezing or no complaints of shortness of breath.

## 2014-07-17 ENCOUNTER — Encounter (HOSPITAL_COMMUNITY): Payer: Self-pay

## 2014-07-17 ENCOUNTER — Observation Stay (HOSPITAL_COMMUNITY): Payer: BC Managed Care – PPO

## 2014-07-17 DIAGNOSIS — I5033 Acute on chronic diastolic (congestive) heart failure: Secondary | ICD-10-CM | POA: Diagnosis not present

## 2014-07-17 DIAGNOSIS — N179 Acute kidney failure, unspecified: Secondary | ICD-10-CM | POA: Diagnosis not present

## 2014-07-17 DIAGNOSIS — Z79899 Other long term (current) drug therapy: Secondary | ICD-10-CM | POA: Diagnosis not present

## 2014-07-17 DIAGNOSIS — J188 Other pneumonia, unspecified organism: Secondary | ICD-10-CM | POA: Diagnosis not present

## 2014-07-17 DIAGNOSIS — M1990 Unspecified osteoarthritis, unspecified site: Secondary | ICD-10-CM | POA: Diagnosis present

## 2014-07-17 DIAGNOSIS — Z7982 Long term (current) use of aspirin: Secondary | ICD-10-CM | POA: Diagnosis not present

## 2014-07-17 DIAGNOSIS — Z86018 Personal history of other benign neoplasm: Secondary | ICD-10-CM | POA: Diagnosis not present

## 2014-07-17 DIAGNOSIS — M160 Bilateral primary osteoarthritis of hip: Secondary | ICD-10-CM | POA: Diagnosis present

## 2014-07-17 DIAGNOSIS — Z23 Encounter for immunization: Secondary | ICD-10-CM | POA: Diagnosis not present

## 2014-07-17 DIAGNOSIS — R0600 Dyspnea, unspecified: Secondary | ICD-10-CM | POA: Diagnosis not present

## 2014-07-17 DIAGNOSIS — R0602 Shortness of breath: Secondary | ICD-10-CM | POA: Diagnosis not present

## 2014-07-17 DIAGNOSIS — J45901 Unspecified asthma with (acute) exacerbation: Secondary | ICD-10-CM | POA: Diagnosis not present

## 2014-07-17 DIAGNOSIS — R079 Chest pain, unspecified: Secondary | ICD-10-CM | POA: Diagnosis not present

## 2014-07-17 DIAGNOSIS — Z87891 Personal history of nicotine dependence: Secondary | ICD-10-CM | POA: Diagnosis not present

## 2014-07-17 DIAGNOSIS — Z96643 Presence of artificial hip joint, bilateral: Secondary | ICD-10-CM | POA: Diagnosis present

## 2014-07-17 DIAGNOSIS — J9601 Acute respiratory failure with hypoxia: Secondary | ICD-10-CM | POA: Diagnosis not present

## 2014-07-17 LAB — CBC
HCT: 40.9 % (ref 39.0–52.0)
HEMOGLOBIN: 13.3 g/dL (ref 13.0–17.0)
MCH: 28.9 pg (ref 26.0–34.0)
MCHC: 32.5 g/dL (ref 30.0–36.0)
MCV: 88.9 fL (ref 78.0–100.0)
Platelets: 238 10*3/uL (ref 150–400)
RBC: 4.6 MIL/uL (ref 4.22–5.81)
RDW: 14.3 % (ref 11.5–15.5)
WBC: 14.3 10*3/uL — AB (ref 4.0–10.5)

## 2014-07-17 LAB — BASIC METABOLIC PANEL
Anion gap: 10 (ref 5–15)
BUN: 12 mg/dL (ref 6–23)
CO2: 28 mmol/L (ref 19–32)
CREATININE: 1.16 mg/dL (ref 0.50–1.35)
Calcium: 9.3 mg/dL (ref 8.4–10.5)
Chloride: 104 mmol/L (ref 96–112)
GFR, EST AFRICAN AMERICAN: 75 mL/min — AB (ref >90)
GFR, EST NON AFRICAN AMERICAN: 64 mL/min — AB (ref >90)
GLUCOSE: 129 mg/dL — AB (ref 70–99)
POTASSIUM: 4.5 mmol/L (ref 3.5–5.1)
SODIUM: 142 mmol/L (ref 135–145)

## 2014-07-17 LAB — TROPONIN I
TROPONIN I: 0.06 ng/mL — AB (ref <0.031)
Troponin I: 0.06 ng/mL — ABNORMAL HIGH (ref <0.031)
Troponin I: 0.07 ng/mL — ABNORMAL HIGH (ref <0.031)

## 2014-07-17 MED ORDER — METHYLPREDNISOLONE SODIUM SUCC 40 MG IJ SOLR
40.0000 mg | Freq: Four times a day (QID) | INTRAMUSCULAR | Status: DC
  Administered 2014-07-17: 40 mg via INTRAVENOUS
  Filled 2014-07-17: qty 1

## 2014-07-17 MED ORDER — IOHEXOL 350 MG/ML SOLN
100.0000 mL | Freq: Once | INTRAVENOUS | Status: AC | PRN
  Administered 2014-07-17: 100 mL via INTRAVENOUS

## 2014-07-17 MED ORDER — METHYLPREDNISOLONE SODIUM SUCC 40 MG IJ SOLR
40.0000 mg | Freq: Three times a day (TID) | INTRAMUSCULAR | Status: DC
Start: 2014-07-17 — End: 2014-07-18
  Administered 2014-07-17 – 2014-07-18 (×3): 40 mg via INTRAVENOUS
  Filled 2014-07-17 (×3): qty 1

## 2014-07-17 NOTE — Progress Notes (Signed)
Patient ID: Marvin Chandler, male   DOB: 1950-07-06, 64 y.o.   MRN: [id number]  [facility] PROGRESS NOTE  HARI CASAUS HQP:[id number] DOB: 07-09-1950 DOA: 07/15/2014 PCP: Ruben Reason, MD   Brief narrative:    64 y.o. male with a history of asthma, presented to [facility] ED with main concern of 2 days duration of progressively worsening dyspnea that initially started with exertion and progressed to dyspnea at rest over the past 24 hours, associated with productive cough of clear sputum and wheezing, chest tightness that is present with coughing spells. In ED, pt noted to be wheezing and has required nebulizer treatments multiple times, CXR concerning for developing PNA vs edema. TRH asked to admit for further evaluation.   Assessment/Plan:    Principal Problem:   Acute hypoxic respiratory failure - secondary to acute asthma exacerbation with possible perihilar PNA - pt started on solumedrol initially and was transitioned to Prednisone 3/16 - he is however wheezing more this AM so will need to transition back to solumedrol for now and narrow down as clinically indicated  - continue oral ABX doxycycline day #2 - continue to keep on oxygen via [place] - continue BD's scheduled and as needed   Active Problems:   Pleuritic chest pain - possibly related to acute illness - since d-dimer elevated and CE slightly up, Cr is now WNL so will place order for CT angio chest to rule out PE   Elevated troponin - possibly related to demand ischemia from principal problem - pt denies chest pain this am - CT chest angio to rule out PE as noted above    Acute renal failure - Cr is now WNL   DVT prophylaxis: Lovenox SQ  Code Status: Full.  Family Communication:  plan of care discussed with the patient Disposition Plan: Home when stable.   IV access:  Peripheral IV  Procedures and diagnostic studies:    CXR 07/16/2014 Bibasilar and perihilar airspace opacity ? pulmonary edema vs PNA    Medical  Consultants:  None   Other Consultants:  None   IAnti-Infectives:   Doxycycline 3/16 -->  Faye Ramsay, MD  [facility] Pager [phone]  If 7PM-7AM, please contact night-coverage [email or web address] Password [facility] 07/17/2014, 10:31 AM     HPI/Subjective: No events overnight.   Objective: Filed Vitals:   07/16/14 2038 07/16/14 2203 07/17/14 0542 07/17/14 0602  BP:  130/83 140/78   Pulse: 96 96 100 91  Temp:  98.4 F (36.9 C) 97.8 F (36.6 C)   TempSrc:  Oral Oral   Resp: 16 17 18 16   Height:      Weight:      SpO2:  98% 100% 100%    Intake/Output Summary (Last 24 hours) at 07/17/14 1031 Last data filed at 07/17/14 1023  Gross per 24 hour  Intake    480 ml  Output      0 ml  Net    480 ml    Exam:   General:  Pt is alert, follows commands appropriately, not in acute distress  Cardiovascular: Regular rate and rhythm, no rubs, no gallops  Respiratory: Course breath sounds bilaterally with expiratory wheezing worse compared to yesterday   Abdomen: Soft, non tender, non distended, bowel sounds present, no guarding  Extremities: trace bilateral LE pitting, pulses DP and PT palpable bilaterally  Neuro: Grossly nonfocal  Data Reviewed: Basic Metabolic Panel:  Recent Labs Lab 07/15/14 2316 07/16/14 0558 07/17/14 0636  NA 145 140 142  K 3.5 4.4  4.5  CL 101 104 104  CO2  --  28 28  GLUCOSE 121* 260* 129*  BUN 12 11 12   CREATININE 1.10 1.36* 1.16  CALCIUM  --  9.1 9.3   CBC:  Recent Labs Lab 07/15/14 2310 07/15/14 2316 07/16/14 0558 07/17/14 0636  WBC 7.5  --  6.8 14.3*  NEUTROABS 5.1  --   --   --   HGB 13.9 15.6 14.0 13.3  HCT 41.8 46.0 42.5 40.9  MCV 87.8  --  88.4 88.9  PLT 187  --  221 238   Cardiac Enzymes:  Recent Labs Lab 07/16/14 0558 07/16/14 1730 07/16/14 2125  TROPONINI 0.04* 0.04* 0.05*   Medications scheduled . albuterol  2.5 mg Nebulization QID  . aspirin  162 mg Oral Daily  . budesonide  nebulizer   0.25 mg Nebulization  BID  . doxycycline  100 mg Oral Q12H  . enoxaparin  injection  40 mg Subcutaneous Q24H  . methylPREDNISolone   40 mg Intravenous 4 times per day  . montelukast  10 mg Oral QHS  . pantoprazole  40 mg Oral Daily   Continuous Infusions:

## 2014-07-18 LAB — BASIC METABOLIC PANEL
ANION GAP: 7 (ref 5–15)
BUN: 12 mg/dL (ref 6–23)
CO2: 30 mmol/L (ref 19–32)
CREATININE: 1.16 mg/dL (ref 0.50–1.35)
Calcium: 9.4 mg/dL (ref 8.4–10.5)
Chloride: 101 mmol/L (ref 96–112)
GFR, EST AFRICAN AMERICAN: 75 mL/min — AB (ref >90)
GFR, EST NON AFRICAN AMERICAN: 64 mL/min — AB (ref >90)
Glucose, Bld: 133 mg/dL — ABNORMAL HIGH (ref 70–99)
Potassium: 4.5 mmol/L (ref 3.5–5.1)
Sodium: 138 mmol/L (ref 135–145)

## 2014-07-18 LAB — TROPONIN I
Troponin I: 0.06 ng/mL — ABNORMAL HIGH (ref <0.031)
Troponin I: 0.05 ng/mL — ABNORMAL HIGH (ref <0.031)
Troponin I: 0.06 ng/mL — ABNORMAL HIGH (ref <0.031)

## 2014-07-18 LAB — CBC
HCT: 41.5 % (ref 39.0–52.0)
HEMOGLOBIN: 13.6 g/dL (ref 13.0–17.0)
MCH: 29.1 pg (ref 26.0–34.0)
MCHC: 32.8 g/dL (ref 30.0–36.0)
MCV: 88.9 fL (ref 78.0–100.0)
PLATELETS: 260 10*3/uL (ref 150–400)
RBC: 4.67 MIL/uL (ref 4.22–5.81)
RDW: 14.4 % (ref 11.5–15.5)
WBC: 13.5 10*3/uL — ABNORMAL HIGH (ref 4.0–10.5)

## 2014-07-18 LAB — BRAIN NATRIURETIC PEPTIDE: B Natriuretic Peptide: 690.9 pg/mL — ABNORMAL HIGH (ref 0.0–100.0)

## 2014-07-18 MED ORDER — METHYLPREDNISOLONE SODIUM SUCC 40 MG IJ SOLR
40.0000 mg | Freq: Two times a day (BID) | INTRAMUSCULAR | Status: DC
  Administered 2014-07-18 – 2014-07-19 (×2): 40 mg via INTRAVENOUS
  Filled 2014-07-18 (×2): qty 1

## 2014-07-18 MED ORDER — FUROSEMIDE 10 MG/ML IJ SOLN
20.0000 mg | Freq: Every day | INTRAMUSCULAR | Status: DC
  Administered 2014-07-18 – 2014-07-19 (×2): 20 mg via INTRAVENOUS
  Filled 2014-07-18 (×2): qty 2

## 2014-07-18 MED ORDER — BUDESONIDE 0.5 MG/2ML IN SUSP
0.2500 mg | Freq: Two times a day (BID) | RESPIRATORY_TRACT | Status: DC
  Administered 2014-07-18 – 2014-07-19 (×3): 0.25 mg via RESPIRATORY_TRACT
  Filled 2014-07-18 (×4): qty 2

## 2014-07-18 NOTE — Progress Notes (Addendum)
Patient ID: Marvin Chandler, male   DOB: 03-Jan-1951, 64 y.o.   MRN: [id number]  [facility] PROGRESS NOTE  Marvin Chandler:[id number] DOB: 08/31/1950 DOA: 07/15/2014 PCP: Ruben Reason, MD   Brief narrative:    64 y.o. male with a history of asthma, presented to [facility] ED with main concern of 2 days duration of progressively worsening dyspnea that initially started with exertion and progressed to dyspnea at rest over the past 24 hours, associated with productive cough of clear sputum and wheezing, chest tightness that is present with coughing spells. In ED, pt noted to be wheezing and has required nebulizer treatments multiple times, CXR concerning for developing PNA vs edema. [facility] asked to admit for further evaluation.   Assessment/Plan:    Principal Problem:   Acute hypoxic respiratory failure - secondary to acute asthma exacerbation with possible perihilar PNA, vascular congestion, diastolic  - pt started on solumedrol initially as dyspnea appeared mostly related to asthma rather than vascular congestion  - respiratory status is better this AM but still dyspnea with exertion, plan on tapering solumedrol down  - continue empiric ABX doxycycline day #3/5 - continue to keep on oxygen via [place] - continue BD's scheduled and as needed  - CT chest with no PE - see diastolic CHF below   Active Problems:   Acute on chronic diastolic CHF - this is probably the reason for persistent dyspnea with exertion  - last 2 D ECHO in 2013 with grade II diastolic dysfunction - will repeat 2 D ECHO - weight is 102 kg today and unchanged over the past 24 hours  - monitor daily weights, strict I's and O's - place on Lasix 20 mg IV QD    Pleuritic chest pain - possibly related to principal problem and diastolic CHF  - CT chest with no PE - pain resolved    Elevated troponin - possibly related to demand ischemia from principal problem, trending up slowly  - pt denies chest pain this am but still with  mild exertional dyspnea  - CT chest angio with no PE - 2 D ECHO requested to evaluate systolic and diastolic fxn  - last 2 D ECHO in 2013 with grade II diastolic dysfunction - place on lasix 20 mg IV QD and monitor clinical response    Acute renal failure - Cr is now WNL   DVT prophylaxis: Lovenox SQ  Code Status: Full.  Family Communication:  plan of care discussed with the patient Disposition Plan: Home when stable.   IV access:  Peripheral IV  Procedures and diagnostic studies:    CXR 07/16/2014 Bibasilar and perihilar airspace opacity ? pulmonary edema vs PNA    Medical Consultants:  None   Other Consultants:  None   IAnti-Infectives:   Doxycycline 3/16 -->  Faye Ramsay, MD  [facility] Pager [phone]  If 7PM-7AM, please contact night-coverage [email or web address] Password [email or web address] 07/18/2014, 11:19 AM   LOS: 1 day   HPI/Subjective: No events overnight.   Objective: Filed Vitals:   07/17/14 2153 07/18/14 0501 07/18/14 0539 07/18/14 0900  BP: 133/80 134/88 130/85 122/65  Pulse: 91 84 78 78  Temp: 97.7 F (36.5 C) 97.8 F (36.6 C) 97.7 F (36.5 C) 97.8 F (36.6 C)  TempSrc: Oral Oral Oral Oral  Resp: 15 16 17 18   Height:      Weight:      SpO2: 99% 100% 100% 100%    Intake/Output Summary (Last 24 hours) at 07/18/14 1119 Last data filed at  07/18/14 0511  Gross per 24 hour  Intake    610 ml  Output      0 ml  Net    610 ml    Exam:   General:  Pt is alert, follows commands appropriately, not in acute distress  Cardiovascular: Regular rate and rhythm, no rubs, no gallops  Respiratory: Mild end exp wheezing, bibasilar crackles   Abdomen: Soft, non tender, non distended, bowel sounds present, no guarding  Extremities: trace bilateral LE pitting, pulses DP and PT palpable bilaterally  Neuro: Grossly nonfocal  Data Reviewed: Basic Metabolic Panel:  Recent Labs Lab 07/15/14 2316 07/16/14 0558 07/17/14 0636 07/18/14 0413  NA 145 140 142 138  K  3.5 4.4 4.5 4.5  CL 101 104 104 101  CO2  --  28 28 30   GLUCOSE 121* 260* 129* 133*  BUN 12 11 12 12   CREATININE 1.10 1.36* 1.16 1.16  CALCIUM  --  9.1 9.3 9.4   CBC:  Recent Labs Lab 07/15/14 2310 07/15/14 2316 07/16/14 0558 07/17/14 0636 07/18/14 0413  WBC 7.5  --  6.8 14.3* 13.5*  NEUTROABS 5.1  --   --   --   --   HGB 13.9 15.6 14.0 13.3 13.6  HCT 41.8 46.0 42.5 40.9 41.5  MCV 87.8  --  88.4 88.9 88.9  PLT 187  --  221 238 260   Cardiac Enzymes:  Recent Labs Lab 07/16/14 1730 07/16/14 2125 07/17/14 1208 07/17/14 1550 07/17/14 2230  TROPONINI 0.04* 0.05* 0.06* 0.06* 0.07*   Medications scheduled . albuterol  2.5 mg Nebulization QID  . aspirin  162 mg Oral Daily  . budesonide  nebulizer   0.25 mg Nebulization BID  . doxycycline  100 mg Oral Q12H  . enoxaparin  injection  40 mg Subcutaneous Q24H  . methylPREDNISolone   40 mg Intravenous 4 times per day  . montelukast  10 mg Oral QHS  . pantoprazole  40 mg Oral Daily   Continuous Infusions:

## 2014-07-19 LAB — BASIC METABOLIC PANEL
ANION GAP: 5 (ref 5–15)
BUN: 21 mg/dL (ref 6–23)
CO2: 32 mmol/L (ref 19–32)
Calcium: 9.6 mg/dL (ref 8.4–10.5)
Chloride: 102 mmol/L (ref 96–112)
Creatinine, Ser: 1.35 mg/dL (ref 0.50–1.35)
GFR calc non Af Amer: 54 mL/min — ABNORMAL LOW (ref >90)
GFR, EST AFRICAN AMERICAN: 62 mL/min — AB (ref >90)
Glucose, Bld: 120 mg/dL — ABNORMAL HIGH (ref 70–99)
POTASSIUM: 4.9 mmol/L (ref 3.5–5.1)
Sodium: 139 mmol/L (ref 135–145)

## 2014-07-19 LAB — CBC
HEMATOCRIT: 43 % (ref 39.0–52.0)
HEMOGLOBIN: 13.8 g/dL (ref 13.0–17.0)
MCH: 28.9 pg (ref 26.0–34.0)
MCHC: 32.1 g/dL (ref 30.0–36.0)
MCV: 90.1 fL (ref 78.0–100.0)
PLATELETS: 249 10*3/uL (ref 150–400)
RBC: 4.77 MIL/uL (ref 4.22–5.81)
RDW: 14.4 % (ref 11.5–15.5)
WBC: 16.6 10*3/uL — AB (ref 4.0–10.5)

## 2014-07-19 MED ORDER — SENNOSIDES-DOCUSATE SODIUM 8.6-50 MG PO TABS
1.0000 | ORAL_TABLET | Freq: Two times a day (BID) | ORAL | Status: DC
  Administered 2014-07-19 – 2014-07-21 (×5): 1 via ORAL
  Filled 2014-07-19 (×6): qty 1

## 2014-07-19 MED ORDER — METHYLPREDNISOLONE SODIUM SUCC 40 MG IJ SOLR: 40.0000 mg | Freq: Every day | INTRAMUSCULAR | Status: DC

## 2014-07-19 MED ORDER — POLYETHYLENE GLYCOL 3350 17 G PO PACK
17.0000 g | PACK | Freq: Every day | ORAL | Status: DC
  Administered 2014-07-19 – 2014-07-21 (×3): 17 g via ORAL
  Filled 2014-07-19 (×3): qty 1

## 2014-07-19 MED ORDER — FUROSEMIDE 10 MG/ML IJ SOLN
40.0000 mg | Freq: Every day | INTRAMUSCULAR | Status: DC
  Administered 2014-07-19 – 2014-07-21 (×3): 40 mg via INTRAVENOUS
  Filled 2014-07-19 (×3): qty 4

## 2014-07-19 NOTE — Plan of Care (Signed)
Problem: Discharge Progression Outcomes Goal: Pain controlled with appropriate interventions Outcome: Progressing No c/o pain this shift.

## 2014-07-19 NOTE — Progress Notes (Signed)
Patient ID: Marvin Chandler, male   DOB: 05-May-1950, 64 y.o.   MRN: [id number]  [facility] PROGRESS NOTE  WISAM SIEFRING [facility]:[id number] DOB: 1950/06/09 DOA: 07/15/2014 PCP: Ruben Reason, MD   Brief narrative:    64 y.o. male with a history of asthma, presented to [facility] ED with main concern of 2 days duration of progressively worsening dyspnea that initially started with exertion and progressed to dyspnea at rest over the past 24 hours, associated with productive cough of clear sputum and wheezing, chest tightness that is present with coughing spells. In ED, pt noted to be wheezing and has required nebulizer treatments multiple times, CXR concerning for developing PNA vs edema. [facility] asked to admit for further evaluation.   Assessment/Plan:    Principal Problem:   Acute hypoxic respiratory failure - secondary to acute asthma exacerbation with possible perihilar PNA, vascular congestion, diastolic  - pt started on solumedrol initially as dyspnea appeared mostly related to asthma rather than vascular congestion  - respiratory status is better this AM but still dyspnea with exertion, plan on tapering solumedrol down as no wheezing on exam  - continue empiric ABX doxycycline day #4/5 - continue to keep on oxygen via [place] - continue BD's scheduled and as needed  - CT chest with no PE - see diastolic CHF below   Active Problems:   Acute on chronic diastolic CHF - this is probably the reason for persistent dyspnea with exertion  - last 2 D ECHO in 2013 with grade II diastolic dysfunction - will repeat 2 D ECHO, still pending  - weight trending up from 102 -- > 104 kg this AM - monitor daily weights, strict I's and O's - started lasix 20 mg IV QD 3/18 but will increase the dose to 40 mg IV QD and monitor clinical response    Pleuritic chest pain - possibly related to principal problem and diastolic CHF  - CT chest with no PE - pain resolved    Elevated troponin - possibly related to demand  ischemia from principal problem, remain mildly elevated  - pt denies chest pain this am but still with mild exertional dyspnea  - CT chest angio with no PE - 2 D ECHO requested to evaluate systolic and diastolic fxn  - last 2 D ECHO in 2013 with grade II diastolic dysfunction - continue Lasix as noted above    Acute renal failure - Cr remains WNL  DVT prophylaxis: Lovenox SQ  Code Status: Full.  Family Communication:  plan of care discussed with the patient Disposition Plan: Home when stable.   IV access:  Peripheral IV  Procedures and diagnostic studies:    CXR 07/16/2014 Bibasilar and perihilar airspace opacity ? pulmonary edema vs PNA    Ct Angio Chest Pe W/cm &/or Wo Cm  07/17/2014   No evidence of pulmonary embolism. CT findings consistent with largely resolved interstitial edema. Trace amount of right pleural fluid present.    Medical Consultants:  None   Other Consultants:  None   IAnti-Infectives:   Doxycycline 3/16 -->  Faye Ramsay, MD  [facility] Pager [phone]  If 7PM-7AM, please contact night-coverage [email or web address] Password [facility] 07/19/2014, 11:44 AM   LOS: 2 days   HPI/Subjective: No events overnight.   Objective: Filed Vitals:   07/18/14 1332 07/18/14 2225 07/19/14 0444 07/19/14 1016  BP: 134/90 125/87 119/89   Pulse: 84 86 80   Temp: 97.9 F (36.6 C) 98 F (36.7 C) 98.5 F (36.9 C)  TempSrc: Oral Oral Oral   Resp: 20 20 16    Height:      Weight:   104.01 kg (229 lb 4.8 oz)   SpO2: 99% 99% 100% 100%    Intake/Output Summary (Last 24 hours) at 07/19/14 1144 Last data filed at 07/19/14 1023  Gross per 24 hour  Intake    360 ml  Output      0 ml  Net    360 ml    Exam:   General:  Pt is alert, follows commands appropriately, not in acute distress  Cardiovascular: Regular rate and rhythm, no rubs, no gallops  Respiratory: Minimal bibasilar crackles with no wheezing   Abdomen: Soft, non tender, non distended, bowel sounds present,  no guarding  Extremities: trace bilateral LE pitting, pulses DP and PT palpable bilaterally  Neuro: Grossly nonfocal  Data Reviewed: Basic Metabolic Panel:  Recent Labs Lab 07/15/14 2316 07/16/14 0558 07/17/14 0636 07/18/14 0413 07/19/14 0416  NA 145 140 142 138 139  K 3.5 4.4 4.5 4.5 4.9  CL 101 104 104 101 102  CO2  --  28 28 30  32  GLUCOSE 121* 260* 129* 133* 120*  BUN 12 11 12 12 21   CREATININE 1.10 1.36* 1.16 1.16 1.35  CALCIUM  --  9.1 9.3 9.4 9.6   CBC:  Recent Labs Lab 07/15/14 2310 07/15/14 2316 07/16/14 0558 07/17/14 0636 07/18/14 0413 07/19/14 0416  WBC 7.5  --  6.8 14.3* 13.5* 16.6*  NEUTROABS 5.1  --   --   --   --   --   HGB 13.9 15.6 14.0 13.3 13.6 13.8  HCT 41.8 46.0 42.5 40.9 41.5 43.0  MCV 87.8  --  88.4 88.9 88.9 90.1  PLT 187  --  221 238 260 249   Cardiac Enzymes:  Recent Labs Lab 07/17/14 1550 07/17/14 2230 07/18/14 1228 07/18/14 1725 07/18/14 2300  TROPONINI 0.06* 0.07* 0.06* 0.05* 0.06*   Medications scheduled . albuterol  2.5 mg Nebulization QID  . aspirin  162 mg Oral Daily  . budesonide  nebulizer   0.25 mg Nebulization BID  . doxycycline  100 mg Oral Q12H  . enoxaparin  injection  40 mg Subcutaneous Q24H  . methylPREDNISolone   40 mg Intravenous 4 times per day  . montelukast  10 mg Oral QHS  . pantoprazole  40 mg Oral Daily   Continuous Infusions:

## 2014-07-20 DIAGNOSIS — R0600 Dyspnea, unspecified: Secondary | ICD-10-CM

## 2014-07-20 LAB — BASIC METABOLIC PANEL
ANION GAP: 7 (ref 5–15)
BUN: 22 mg/dL (ref 6–23)
CO2: 35 mmol/L — AB (ref 19–32)
CREATININE: 1.36 mg/dL — AB (ref 0.50–1.35)
Calcium: 9.3 mg/dL (ref 8.4–10.5)
Chloride: 97 mmol/L (ref 96–112)
GFR, EST AFRICAN AMERICAN: 61 mL/min — AB (ref >90)
GFR, EST NON AFRICAN AMERICAN: 53 mL/min — AB (ref >90)
Glucose, Bld: 117 mg/dL — ABNORMAL HIGH (ref 70–99)
Potassium: 4 mmol/L (ref 3.5–5.1)
Sodium: 139 mmol/L (ref 135–145)

## 2014-07-20 LAB — CBC
HEMATOCRIT: 45.9 % (ref 39.0–52.0)
HEMOGLOBIN: 14.9 g/dL (ref 13.0–17.0)
MCH: 29.1 pg (ref 26.0–34.0)
MCHC: 32.5 g/dL (ref 30.0–36.0)
MCV: 89.6 fL (ref 78.0–100.0)
Platelets: 251 10*3/uL (ref 150–400)
RBC: 5.12 MIL/uL (ref 4.22–5.81)
RDW: 14.1 % (ref 11.5–15.5)
WBC: 16.1 10*3/uL — ABNORMAL HIGH (ref 4.0–10.5)

## 2014-07-20 LAB — TROPONIN I: Troponin I: 0.07 ng/mL — ABNORMAL HIGH (ref <0.031)

## 2014-07-20 LAB — BRAIN NATRIURETIC PEPTIDE: B NATRIURETIC PEPTIDE 5: 298.6 pg/mL — AB (ref 0.0–100.0)

## 2014-07-20 MED ORDER — BUDESONIDE 0.25 MG/2ML IN SUSP
0.2500 mg | Freq: Two times a day (BID) | RESPIRATORY_TRACT | Status: DC
  Administered 2014-07-20 – 2014-07-22 (×3): 0.25 mg via RESPIRATORY_TRACT
  Filled 2014-07-20 (×7): qty 2

## 2014-07-20 MED ORDER — BUDESONIDE 0.5 MG/2ML IN SUSP: 0.2500 mg | Freq: Two times a day (BID) | RESPIRATORY_TRACT | Status: DC

## 2014-07-20 MED ORDER — METHYLPREDNISOLONE SODIUM SUCC 40 MG IJ SOLR
40.0000 mg | Freq: Two times a day (BID) | INTRAMUSCULAR | Status: DC
  Administered 2014-07-20 – 2014-07-22 (×5): 40 mg via INTRAVENOUS
  Filled 2014-07-20 (×5): qty 1

## 2014-07-20 NOTE — Progress Notes (Signed)
*  PRELIMINARY RESULTS* Echocardiogram 2D Echocardiogram has been performed.  Leavy Cella 07/20/2014, 11:14 AM

## 2014-07-20 NOTE — Plan of Care (Signed)
Problem: Discharge Progression Outcomes Goal: Activity appropriate for discharge plan Outcome: Completed/Met Date Met:  07/20/14 Walked in hallway     

## 2014-07-20 NOTE — Progress Notes (Addendum)
Patient ID: Marvin Chandler, male   DOB: 15-Apr-1951, 64 y.o.   MRN: [id number]  [facility] PROGRESS NOTE  NIK GORRELL OVZ:[id number] DOB: 05/18/1950 DOA: 07/15/2014 PCP: Ruben Reason, MD   Brief narrative:    64 y.o. male with a history of asthma, presented to [facility] ED with main concern of 2 days duration of progressively worsening dyspnea that initially started with exertion and progressed to dyspnea at rest over the past 24 hours, associated with productive cough of clear sputum and wheezing, chest tightness that is present with coughing spells. In ED, pt noted to be wheezing and has required nebulizer treatments multiple times, CXR concerning for developing PNA vs edema. [facility] asked to admit for further evaluation.   Assessment/Plan:    Principal Problem:   Acute hypoxic respiratory failure - secondary to acute asthma exacerbation with possible perihilar PNA, vascular congestion, diastolic  - pt started on solumedrol initially as dyspnea appeared mostly related to asthma rather than vascular congestion  - respiratory status is better this AM but still dyspnea with exertion, plan to continue tapering solumedrol down  - continue empiric ABX doxycycline day #5/5 - continue to keep on oxygen via [place] - continue BD's scheduled and as needed  - CT chest with no PE - see diastolic CHF below   Active Problems:   Acute on chronic diastolic CHF - this is probably the reason for persistent dyspnea with exertion  - last 2 D ECHO in 2013 with grade II diastolic dysfunction - 2 D ECHO, still pending  - weight trending: 102 -- > 104 --> 103 kg  - monitor daily weights, strict I's and O's - started lasix 20 mg IV QD 3/18 but increased the dose to 40 mg IV QD 3/19   Pleuritic chest pain - possibly related to principal problem and diastolic CHF  - CT chest with no PE - pain resolved    Elevated troponin - possibly related to demand ischemia from principal problem, remain mildly elevated  -  pt denies chest pain this am but still with exertional dyspnea  - CT chest angio with no PE - 2 D ECHO requested to evaluate systolic and diastolic fxn  - last 2 D ECHO in 2013 with grade II diastolic dysfunction - continue Lasix as noted above  - discussed with Dr. Radford Pax cardiologist, agrees with plan and recommends outpatient follow up    Acute renal failure - Cr slightly up from Lasix  - will repeat BMP in AM   DVT prophylaxis: Lovenox SQ  Code Status: Full.  Family Communication:  plan of care discussed with the patient Disposition Plan: Home when stable.   IV access:  Peripheral IV  Procedures and diagnostic studies:    CXR 07/16/2014 Bibasilar and perihilar airspace opacity ? pulmonary edema vs PNA    Ct Angio Chest Pe W/cm &/or Wo Cm  07/17/2014   No evidence of pulmonary embolism. CT findings consistent with largely resolved interstitial edema. Trace amount of right pleural fluid present.    Medical Consultants:  None   Other Consultants:  None   IAnti-Infectives:   Doxycycline 3/16 --> 3/20  Faye Ramsay, MD  [facility] Pager [phone]  If 7PM-7AM, please contact night-coverage [email or web address] Password [facility] 07/20/2014, 11:39 AM   LOS: 3 days   HPI/Subjective: No events overnight.   Objective: Filed Vitals:   07/19/14 1545 07/19/14 2213 07/20/14 0621 07/20/14 0730  BP:  131/99 119/68   Pulse:  80 74   Temp:  97.7 F (36.5 C) 97.7 F (36.5 C)   TempSrc:  Oral Oral   Resp:  17 16   Height:      Weight:   103.057 kg (227 lb 3.2 oz)   SpO2: 97% 99% 100% 99%    Intake/Output Summary (Last 24 hours) at 07/20/14 1139 Last data filed at 07/20/14 0950  Gross per 24 hour  Intake   1420 ml  Output   2300 ml  Net   -880 ml    Exam:   General:  Pt is alert, follows commands appropriately, not in acute distress  Cardiovascular: Regular rate and rhythm, no rubs, no gallops  Respiratory: Minimal bibasilar crackles with mild exp wheezing   Abdomen:  Soft, non tender, non distended, bowel sounds present, no guarding  Extremities: trace bilateral LE pitting, pulses DP and PT palpable bilaterally  Neuro: Grossly nonfocal  Data Reviewed: Basic Metabolic Panel:  Recent Labs Lab 07/16/14 0558 07/17/14 0636 07/18/14 0413 07/19/14 0416 07/20/14 0443  NA 140 142 138 139 139  K 4.4 4.5 4.5 4.9 4.0  CL 104 104 101 102 97  CO2 28 28 30  32 35*  GLUCOSE 260* 129* 133* 120* 117*  BUN 11 12 12 21 22   CREATININE 1.36* 1.16 1.16 1.35 1.36*  CALCIUM 9.1 9.3 9.4 9.6 9.3   CBC:  Recent Labs Lab 07/15/14 2310  07/16/14 0558 07/17/14 0636 07/18/14 0413 07/19/14 0416 07/20/14 0443  WBC 7.5  --  6.8 14.3* 13.5* 16.6* 16.1*  NEUTROABS 5.1  --   --   --   --   --   --   HGB 13.9  < > 14.0 13.3 13.6 13.8 14.9  HCT 41.8  < > 42.5 40.9 41.5 43.0 45.9  MCV 87.8  --  88.4 88.9 88.9 90.1 89.6  PLT 187  --  221 238 260 249 251  < > = values in this interval not displayed. Cardiac Enzymes:  Recent Labs Lab 07/17/14 2230 07/18/14 1228 07/18/14 1725 07/18/14 2300 07/20/14 0443  TROPONINI 0.07* 0.06* 0.05* 0.06* 0.07*    Scheduled medications:  . albuterol  2.5 mg Nebulization QID  . aspirin  162 mg Oral Daily  . budesonide  0.25 mg Nebulization BID  . doxycycline  100 mg Oral Q12H  . enoxaparin (LOVENOX) injection  40 mg Subcutaneous Q24H  . furosemide  40 mg Intravenous Daily  . methylPREDNISolone (SOLU-MEDROL) injection  40 mg Intravenous Q12H  . montelukast  10 mg Oral QHS  . pantoprazole  40 mg Oral Daily  . polyethylene glycol  17 g Oral Daily  . senna-docusate  1 tablet Oral BID  . sodium chloride  3 mL Intravenous Q12H     Continuous Infusions:

## 2014-07-21 LAB — CBC
HCT: 45.6 % (ref 39.0–52.0)
Hemoglobin: 15 g/dL (ref 13.0–17.0)
MCH: 29.5 pg (ref 26.0–34.0)
MCHC: 32.9 g/dL (ref 30.0–36.0)
MCV: 89.6 fL (ref 78.0–100.0)
Platelets: 238 10*3/uL (ref 150–400)
RBC: 5.09 MIL/uL (ref 4.22–5.81)
RDW: 13.8 % (ref 11.5–15.5)
WBC: 14.4 10*3/uL — ABNORMAL HIGH (ref 4.0–10.5)

## 2014-07-21 LAB — BASIC METABOLIC PANEL
Anion gap: 9 (ref 5–15)
BUN: 16 mg/dL (ref 6–23)
CHLORIDE: 98 mmol/L (ref 96–112)
CO2: 33 mmol/L — AB (ref 19–32)
CREATININE: 1.17 mg/dL (ref 0.50–1.35)
Calcium: 9.3 mg/dL (ref 8.4–10.5)
GFR calc Af Amer: 74 mL/min — ABNORMAL LOW (ref >90)
GFR calc non Af Amer: 64 mL/min — ABNORMAL LOW (ref >90)
Glucose, Bld: 153 mg/dL — ABNORMAL HIGH (ref 70–99)
POTASSIUM: 4.5 mmol/L (ref 3.5–5.1)
Sodium: 140 mmol/L (ref 135–145)

## 2014-07-21 MED ORDER — ALBUTEROL SULFATE (2.5 MG/3ML) 0.083% IN NEBU
2.5000 mg | INHALATION_SOLUTION | Freq: Three times a day (TID) | RESPIRATORY_TRACT | Status: DC
  Administered 2014-07-21 – 2014-07-22 (×3): 2.5 mg via RESPIRATORY_TRACT
  Filled 2014-07-21 (×3): qty 3

## 2014-07-21 MED ORDER — FUROSEMIDE 40 MG PO TABS
40.0000 mg | ORAL_TABLET | Freq: Every day | ORAL | Status: DC
  Administered 2014-07-22: 40 mg via ORAL
  Filled 2014-07-21: qty 1

## 2014-07-21 NOTE — Progress Notes (Signed)
Patient ID: Marvin Chandler, male   DOB: 1950-10-01, 64 y.o.   MRN: [id number]  [facility] PROGRESS NOTE  KAMAL JURGENS IRS:[id number] DOB: [birth year] DOA: 07/15/2014 PCP: Ruben Reason, MD   Brief narrative:    64 y.o. male with a history of asthma, presented to [facility] ED with main concern of 2 days duration of progressively worsening dyspnea that initially started with exertion and progressed to dyspnea at rest over the past 24 hours, associated with productive cough of clear sputum and wheezing, chest tightness that is present with coughing spells. In ED, pt noted to be wheezing and has required nebulizer treatments multiple times, CXR concerning for developing PNA vs edema. [facility] asked to admit for further evaluation.   Assessment/Plan:    Principal Problem:   Acute hypoxic respiratory failure - secondary to acute asthma exacerbation with possible perihilar PNA, vascular congestion, diastolic  - pt started on solumedrol initially as dyspnea appeared mostly related to asthma rather than vascular congestion  - respiratory status is better this AM, mild exertional dyspnea present but pt reports its better overall  - completed oral doxy for 5 days - continue BD's scheduled and as needed  - CT chest with no PE - see diastolic CHF below   Active Problems:   Acute on chronic diastolic and systolic CHF - this is probably the reason for persistent dyspnea with exertion  - last 2 D ECHO in 2013 with grade II diastolic dysfunction - 2 D ECHO, with EF 45% - weight trending: 102 -- > 104 --> 103 kg  - monitor daily weights, strict I's and O's - continue Lasix but will change to PO - d/w cardiology on call, recommendation is to follow up in an outpatient setting  - may need stress test in near future    Pleuritic chest pain - possibly related to principal problem and diastolic CHF  - CT chest with no PE - pain resolved    Elevated troponin - possibly related to demand ischemia from  principal problem, remain mildly elevated  - pt denies chest pain this am but still with mild exertional dyspnea  - CT chest angio with no PE - continue Lasix as noted above  - 2 D ECHO with slightly lower EF 45% - discussed with Dr. Radford Pax cardiologist, agrees with plan and recommends outpatient follow up    Acute renal failure - Cr now WNL - will repeat BMP in AM  DVT prophylaxis: Lovenox SQ  Code Status: Full.  Family Communication:  plan of care discussed with the patient Disposition Plan: Home when stable.   IV access:  Peripheral IV  Procedures and diagnostic studies:    CXR 07/16/2014 Bibasilar and perihilar airspace opacity ? pulmonary edema vs PNA    Ct Angio Chest Pe W/cm &/or Wo Cm  07/17/2014   No evidence of pulmonary embolism. CT findings consistent with largely resolved interstitial edema. Trace amount of right pleural fluid present.    Medical Consultants:  None   Other Consultants:  None   IAnti-Infectives:   Doxycycline 3/16 --> 3/20  Faye Ramsay, MD  [facility] Pager [phone]  If 7PM-7AM, please contact night-coverage [email or web address] Password [facility] 07/21/2014, 1:38 PM   LOS: 4 days   HPI/Subjective: No events overnight.   Objective: Filed Vitals:   07/20/14 2218 07/20/14 2251 07/21/14 0610 07/21/14 1329  BP:  112/76 131/78   Pulse: 75 77 77   Temp:  98 F (36.7 C) 97.5 F (36.4 C)   TempSrc:  Oral Oral   Resp: 17 17 17    Height:      Weight:   103 kg (227 lb 1.2 oz)   SpO2: 98% 100% 100% 100%    Intake/Output Summary (Last 24 hours) at 07/21/14 1338 Last data filed at 07/21/14 0076  Gross per 24 hour  Intake    360 ml  Output   2575 ml  Net  -2215 ml    Exam:   General:  Pt is alert, follows commands appropriately, not in acute distress  Cardiovascular: Regular rate and rhythm, no rubs, no gallops  Respiratory: Minimal bibasilar crackles with mild exp wheezing   Abdomen: Soft, non tender, non distended, bowel sounds present,  no guarding  Extremities: trace bilateral LE pitting, pulses DP and PT palpable bilaterally  Neuro: Grossly nonfocal  Data Reviewed: Basic Metabolic Panel:  Recent Labs Lab 07/17/14 0636 07/18/14 0413 07/19/14 0416 07/20/14 0443 07/21/14 0541  NA 142 138 139 139 140  K 4.5 4.5 4.9 4.0 4.5  CL 104 101 102 97 98  CO2 28 30 32 35* 33*  GLUCOSE 129* 133* 120* 117* 153*  BUN 12 12 21 22 16   CREATININE 1.16 1.16 1.35 1.36* 1.17  CALCIUM 9.3 9.4 9.6 9.3 9.3   CBC:  Recent Labs Lab 07/15/14 2310  07/17/14 0636 07/18/14 0413 07/19/14 0416 07/20/14 0443 07/21/14 0541  WBC 7.5  < > 14.3* 13.5* 16.6* 16.1* 14.4*  NEUTROABS 5.1  --   --   --   --   --   --   HGB 13.9  < > 13.3 13.6 13.8 14.9 15.0  HCT 41.8  < > 40.9 41.5 43.0 45.9 45.6  MCV 87.8  < > 88.9 88.9 90.1 89.6 89.6  PLT 187  < > 238 260 249 251 238  < > = values in this interval not displayed. Cardiac Enzymes:  Recent Labs Lab 07/17/14 2230 07/18/14 1228 07/18/14 1725 07/18/14 2300 07/20/14 0443  TROPONINI 0.07* 0.06* 0.05* 0.06* 0.07*    Scheduled medications:  . albuterol  2.5 mg Nebulization QID  . aspirin  162 mg Oral Daily  . budesonide  0.25 mg Nebulization BID  . doxycycline  100 mg Oral Q12H  . enoxaparin (LOVENOX) injection  40 mg Subcutaneous Q24H  . furosemide  40 mg Intravenous Daily  . methylPREDNISolone (SOLU-MEDROL) injection  40 mg Intravenous Q12H  . montelukast  10 mg Oral QHS  . pantoprazole  40 mg Oral Daily  . polyethylene glycol  17 g Oral Daily  . senna-docusate  1 tablet Oral BID  . sodium chloride  3 mL Intravenous Q12H     Continuous Infusions:

## 2014-07-22 LAB — CBC
HCT: 45.1 % (ref 39.0–52.0)
Hemoglobin: 14.5 g/dL (ref 13.0–17.0)
MCH: 28.7 pg (ref 26.0–34.0)
MCHC: 32.2 g/dL (ref 30.0–36.0)
MCV: 89.3 fL (ref 78.0–100.0)
PLATELETS: 257 10*3/uL (ref 150–400)
RBC: 5.05 MIL/uL (ref 4.22–5.81)
RDW: 14.1 % (ref 11.5–15.5)
WBC: 18.5 10*3/uL — AB (ref 4.0–10.5)

## 2014-07-22 LAB — BASIC METABOLIC PANEL
Anion gap: 8 (ref 5–15)
BUN: 19 mg/dL (ref 6–23)
CO2: 30 mmol/L (ref 19–32)
CREATININE: 1.12 mg/dL (ref 0.50–1.35)
Calcium: 9.2 mg/dL (ref 8.4–10.5)
Chloride: 101 mmol/L (ref 96–112)
GFR calc Af Amer: 78 mL/min — ABNORMAL LOW (ref >90)
GFR calc non Af Amer: 67 mL/min — ABNORMAL LOW (ref >90)
Glucose, Bld: 129 mg/dL — ABNORMAL HIGH (ref 70–99)
Potassium: 4.3 mmol/L (ref 3.5–5.1)
Sodium: 139 mmol/L (ref 135–145)

## 2014-07-22 LAB — BRAIN NATRIURETIC PEPTIDE: B NATRIURETIC PEPTIDE 5: 134.2 pg/mL — AB (ref 0.0–100.0)

## 2014-07-22 MED ORDER — ALBUTEROL SULFATE HFA 108 (90 BASE) MCG/ACT IN AERS: 2.0000 | INHALATION_SPRAY | RESPIRATORY_TRACT | Status: DC | PRN

## 2014-07-22 MED ORDER — PREDNISONE 10 MG PO TABS: ORAL_TABLET | ORAL | Status: DC

## 2014-07-22 MED ORDER — FUROSEMIDE 40 MG PO TABS: 40.0000 mg | ORAL_TABLET | Freq: Every day | ORAL | Status: DC

## 2014-07-22 MED ORDER — HYDROCODONE-ACETAMINOPHEN 5-325 MG PO TABS: 1.0000 | ORAL_TABLET | Freq: Four times a day (QID) | ORAL | Status: DC | PRN

## 2014-07-22 MED ORDER — FLUTICASONE-SALMETEROL 250-50 MCG/DOSE IN AEPB: 1.0000 | INHALATION_SPRAY | Freq: Two times a day (BID) | RESPIRATORY_TRACT | Status: DC

## 2014-07-22 MED ORDER — ALBUTEROL SULFATE (2.5 MG/3ML) 0.083% IN NEBU: 2.5000 mg | INHALATION_SOLUTION | Freq: Four times a day (QID) | RESPIRATORY_TRACT | Status: AC | PRN

## 2014-07-22 NOTE — Discharge Instructions (Signed)
Heart Failure Heart failure is a condition in which the heart has trouble pumping blood. This means your heart does not pump blood efficiently for your body to work well. In some cases of heart failure, fluid may back up into your lungs or you may have swelling (edema) in your lower legs. Heart failure is usually a long-term (chronic) condition. It is important for you to take good care of yourself and follow your health care provider's treatment plan. CAUSES  Some health conditions can cause heart failure. Those health conditions include:  High blood pressure (hypertension). Hypertension causes the heart muscle to work harder than normal. When pressure in the blood vessels is high, the heart needs to pump (contract) with more force in order to circulate blood throughout the body. High blood pressure eventually causes the heart to become stiff and weak.  Coronary artery disease (CAD). CAD is the buildup of cholesterol and fat (plaque) in the arteries of the heart. The blockage in the arteries deprives the heart muscle of oxygen and blood. This can cause chest pain and may lead to a heart attack. High blood pressure can also contribute to CAD.  Heart attack (myocardial infarction). A heart attack occurs when one or more arteries in the heart become blocked. The loss of oxygen damages the muscle tissue of the heart. When this happens, part of the heart muscle dies. The injured tissue does not contract as well and weakens the heart's ability to pump blood.  Abnormal heart valves. When the heart valves do not open and close properly, it can cause heart failure. This makes the heart muscle pump harder to keep the blood flowing.  Heart muscle disease (cardiomyopathy or myocarditis). Heart muscle disease is damage to the heart muscle from a variety of causes. These can include drug or alcohol abuse, infections, or unknown reasons. These can increase the risk of heart failure.  Lung disease. Lung disease  makes the heart work harder because the lungs do not work properly. This can cause a strain on the heart, leading it to fail.  Diabetes. Diabetes increases the risk of heart failure. High blood sugar contributes to high fat (lipid) levels in the blood. Diabetes can also cause slow damage to tiny blood vessels that carry important nutrients to the heart muscle. When the heart does not get enough oxygen and food, it can cause the heart to become weak and stiff. This leads to a heart that does not contract efficiently.  Other conditions can contribute to heart failure. These include abnormal heart rhythms, thyroid problems, and low blood counts (anemia). Certain unhealthy behaviors can increase the risk of heart failure, including:  Being overweight.  Smoking or chewing tobacco.  Eating foods high in fat and cholesterol.  Abusing illicit drugs or alcohol.  Lacking physical activity. SYMPTOMS  Heart failure symptoms may vary and can be hard to detect. Symptoms may include:  Shortness of breath with activity, such as climbing stairs.  Persistent cough.  Swelling of the feet, ankles, legs, or abdomen.  Unexplained weight gain.  Difficulty breathing when lying flat (orthopnea).  Waking from sleep because of the need to sit up and get more air.  Rapid heartbeat.  Fatigue and loss of energy.  Feeling light-headed, dizzy, or close to fainting.  Loss of appetite.  Nausea.  Increased urination during the night (nocturia). DIAGNOSIS  A diagnosis of heart failure is based on your history, symptoms, physical examination, and diagnostic tests. Diagnostic tests for heart failure may include:  Echocardiography. °· Electrocardiography. °· Chest X-ray. °· Blood tests. °· Exercise stress test. °· Cardiac angiography. °· Radionuclide scans. °TREATMENT  °Treatment is aimed at managing the symptoms of heart failure. Medicines, behavioral changes, or surgical intervention may be necessary to  treat heart failure. °· Medicines to help treat heart failure may include: °¨ Angiotensin-converting enzyme (ACE) inhibitors. This type of medicine blocks the effects of a blood protein called angiotensin-converting enzyme. ACE inhibitors relax (dilate) the blood vessels and help lower blood pressure. °¨ Angiotensin receptor blockers (ARBs). This type of medicine blocks the actions of a blood protein called angiotensin. Angiotensin receptor blockers dilate the blood vessels and help lower blood pressure. °¨ Water pills (diuretics). Diuretics cause the kidneys to remove salt and water from the blood. The extra fluid is removed through urination. This loss of extra fluid lowers the volume of blood the heart pumps. °¨ Beta blockers. These prevent the heart from beating too fast and improve heart muscle strength. °¨ Digitalis. This increases the force of the heartbeat. °· Healthy behavior changes include: °¨ Obtaining and maintaining a healthy weight. °¨ Stopping smoking or chewing tobacco. °¨ Eating heart-healthy foods. °¨ Limiting or avoiding alcohol. °¨ Stopping illicit drug use. °¨ Physical activity as directed by your health care provider. °· Surgical treatment for heart failure may include: °¨ A procedure to open blocked arteries, repair damaged heart valves, or remove damaged heart muscle tissue. °¨ A pacemaker to improve heart muscle function and control certain abnormal heart rhythms. °¨ An internal cardioverter defibrillator to treat certain serious abnormal heart rhythms. °¨ A left ventricular assist device (LVAD) to assist the pumping ability of the heart. °HOME CARE INSTRUCTIONS  °· Take medicines only as directed by your health care provider. Medicines are important in reducing the workload of your heart, slowing the progression of heart failure, and improving your symptoms. °¨ Do not stop taking your medicine unless directed by your health care provider. °¨ Do not skip any dose of medicine. °¨ Refill your  prescriptions before you run out of medicine. Your medicines are needed every day. °· Engage in moderate physical activity if directed by your health care provider. Moderate physical activity can benefit some people. The elderly and people with severe heart failure should consult with a health care provider for physical activity recommendations. °· Eat heart-healthy foods. Food choices should be free of trans fat and low in saturated fat, cholesterol, and salt (sodium). Healthy choices include fresh or frozen fruits and vegetables, fish, lean meats, legumes, fat-free or low-fat dairy products, and whole grain or high fiber foods. Talk to a dietitian to learn more about heart-healthy foods. °· Limit sodium if directed by your health care provider. Sodium restriction may reduce symptoms of heart failure in some people. Talk to a dietitian to learn more about heart-healthy seasonings. °· Use healthy cooking methods. Healthy cooking methods include roasting, grilling, broiling, baking, poaching, steaming, or stir-frying. Talk to a dietitian to learn more about healthy cooking methods. °· Limit fluids if directed by your health care provider. Fluid restriction may reduce symptoms of heart failure in some people. °· Weigh yourself every day. Daily weights are important in the early recognition of excess fluid. You should weigh yourself every morning after you urinate and before you eat breakfast. Wear the same amount of clothing each time you weigh yourself. Record your daily weight. Provide your health care provider with your weight record. °· Monitor and record your blood pressure if directed by your health care   provider.  Check your pulse if directed by your health care provider.  Lose weight if directed by your health care provider. Weight loss may reduce symptoms of heart failure in some people.  Stop smoking or chewing tobacco. Nicotine makes your heart work harder by causing your blood vessels to constrict.  Do not use nicotine gum or patches before talking to your health care provider.  Keep all follow-up visits as directed by your health care provider. This is important.  Limit alcohol intake to no more than 1 drink per day for nonpregnant women and 2 drinks per day for men. One drink equals 12 ounces of beer, 5 ounces of wine, or 1 ounces of hard liquor. Drinking more than that is harmful to your heart. Tell your health care provider if you drink alcohol several times a week. Talk with your health care provider about whether alcohol is safe for you. If your heart has already been damaged by alcohol or you have severe heart failure, drinking alcohol should be stopped completely.  Stop illicit drug use.  Stay up-to-date with immunizations. It is especially important to prevent respiratory infections through current pneumococcal and influenza immunizations.  Manage other health conditions such as hypertension, diabetes, thyroid disease, or abnormal heart rhythms as directed by your health care provider.  Learn to manage stress.  Plan rest periods when fatigued.  Learn strategies to manage high temperatures. If the weather is extremely hot:  Avoid vigorous physical activity.  Use air conditioning or fans or seek a cooler location.  Avoid caffeine and alcohol.  Wear loose-fitting, lightweight, and light-colored clothing.  Learn strategies to manage cold temperatures. If the weather is extremely cold:  Avoid vigorous physical activity.  Layer clothes.  Wear mittens or gloves, a hat, and a scarf when going outside.  Avoid alcohol.  Obtain ongoing education and support as needed.  Participate in or seek rehabilitation as needed to maintain or improve independence and quality of life. SEEK MEDICAL CARE IF:   Your weight increases by 03 lb/1.4 kg in 1 day or 05 lb/2.3 kg in a week.  You have increasing shortness of breath that is unusual for you.  You are unable to participate in  your usual physical activities.  You tire easily.  You cough more than normal, especially with physical activity.  You have any or more swelling in areas such as your hands, feet, ankles, or abdomen.  You are unable to sleep because it is hard to breathe.  You feel like your heart is beating fast (palpitations).  You become dizzy or light-headed upon standing up. SEEK IMMEDIATE MEDICAL CARE IF:   You have difficulty breathing.  There is a change in mental status such as decreased alertness or difficulty with concentration.  You have a pain or discomfort in your chest.  You have an episode of fainting (syncope). MAKE SURE YOU:   Understand these instructions.  Will watch your condition.  Will get help right away if you are not doing well or get worse. Document Released: 04/18/2005 Document Revised: 09/02/2013 Document Reviewed: 05/18/2012 [facility] Patient Information 2015 [place], [place]. This information is not intended to replace advice given to you by your health care provider. Make sure you discuss any questions you have with your health care provider.  Asthma Attack Prevention Although there is no way to prevent asthma from starting, you can take steps to control the disease and reduce its symptoms. Learn about your asthma and how to control it. Take an  active role to control your asthma by working with your health care provider to create and follow an asthma action plan. An asthma action plan guides you in:  Taking your medicines properly.  Avoiding things that set off your asthma or make your asthma worse (asthma triggers).  Tracking your level of asthma control.  Responding to worsening asthma.  Seeking emergency care when needed. To track your asthma, keep records of your symptoms, check your peak flow number using a handheld device that shows how well air moves out of your lungs (peak flow meter), and get regular asthma checkups.  WHAT ARE SOME WAYS TO PREVENT AN  ASTHMA ATTACK?  Take medicines as directed by your health care provider.  Keep track of your asthma symptoms and level of control.  With your health care provider, write a detailed plan for taking medicines and managing an asthma attack. Then be sure to follow your action plan. Asthma is an ongoing condition that needs regular monitoring and treatment.  Identify and avoid asthma triggers. Many outdoor allergens and irritants (such as pollen, mold, cold air, and air pollution) can trigger asthma attacks. Find out what your asthma triggers are and take steps to avoid them.  Monitor your breathing. Learn to recognize warning signs of an attack, such as coughing, wheezing, or shortness of breath. Your lung function may decrease before you notice any signs or symptoms, so regularly measure and record your peak airflow with a home peak flow meter.  Identify and treat attacks early. If you act quickly, you are less likely to have a severe attack. You will also need less medicine to control your symptoms. When your peak flow measurements decrease and alert you to an upcoming attack, take your medicine as instructed and immediately stop any activity that may have triggered the attack. If your symptoms do not improve, get medical help.  Pay attention to increasing quick-relief inhaler use. If you find yourself relying on your quick-relief inhaler, your asthma is not under control. See your health care provider about adjusting your treatment. WHAT CAN MAKE MY SYMPTOMS WORSE? A number of common things can set off or make your asthma symptoms worse and cause temporary increased inflammation of your airways. Keep track of your asthma symptoms for several weeks, detailing all the environmental and emotional factors that are linked with your asthma. When you have an asthma attack, go back to your asthma diary to see which factor, or combination of factors, might have contributed to it. Once you know what these  factors are, you can take steps to control many of them. If you have allergies and asthma, it is important to take asthma prevention steps at home. Minimizing contact with the substance to which you are allergic will help prevent an asthma attack. Some triggers and ways to avoid these triggers are: Animal Dander:  Some people are allergic to the flakes of skin or dried saliva from animals with fur or feathers.   There is no such thing as a hypoallergenic dog or cat breed. All dogs or cats can cause allergies, even if they don't shed.  Keep these pets out of your home.  If you are not able to keep a pet outdoors, keep the pet out of your bedroom and other sleeping areas at all times, and keep the door closed.  Remove carpets and furniture covered with cloth from your home. If that is not possible, keep the pet away from fabric-covered furniture and carpets. Dust Mites: Many people  with asthma are allergic to dust mites. Dust mites are tiny bugs that are found in every home in mattresses, pillows, carpets, fabric-covered furniture, bedcovers, clothes, stuffed toys, and other fabric-covered items.   Cover your mattress in a special dust-proof cover.  Cover your pillow in a special dust-proof cover, or wash the pillow each week in hot water. Water must be hotter than 130 F (54.4 C) to kill dust mites. Cold or warm water used with detergent and bleach can also be effective.  Wash the sheets and blankets on your bed each week in hot water.  Try not to sleep or lie on cloth-covered cushions.  Call ahead when traveling and ask for a smoke-free hotel room. Bring your own bedding and pillows in case the hotel only supplies feather pillows and down comforters, which may contain dust mites and cause asthma symptoms.  Remove carpets from your bedroom and those laid on concrete, if you can.  Keep stuffed toys out of the bed, or wash the toys weekly in hot water or cooler water with detergent and  bleach. Cockroaches: Many people with asthma are allergic to the droppings and remains of cockroaches.   Keep food and garbage in closed containers. Never leave food out.  Use poison baits, traps, powders, gels, or paste (for example, boric acid).  If a spray is used to kill cockroaches, stay out of the room until the odor goes away. Indoor Mold:  Fix leaky faucets, pipes, or other sources of water that have mold around them.  Clean floors and moldy surfaces with a fungicide or diluted bleach.  Avoid using humidifiers, vaporizers, or swamp coolers. These can spread molds through the air. Pollen and Outdoor Mold:  When pollen or mold spore counts are high, try to keep your windows closed.  Stay indoors with windows closed from late morning to afternoon. Pollen and some mold spore counts are highest at that time.  Ask your health care provider whether you need to take anti-inflammatory medicine or increase your dose of the medicine before your allergy season starts. Other Irritants to Avoid:  Tobacco smoke is an irritant. If you smoke, ask your health care provider how you can quit. Ask family members to quit smoking, too. Do not allow smoking in your home or car.  If possible, do not use a wood-burning stove, kerosene heater, or fireplace. Minimize exposure to all sources of smoke, including incense, candles, fires, and fireworks.  Try to stay away from strong odors and sprays, such as perfume, talcum powder, hair spray, and paints.  Decrease humidity in your home and use an indoor air cleaning device. Reduce indoor humidity to below 60%. Dehumidifiers or central air conditioners can do this.  Decrease house dust exposure by changing furnace and air cooler filters frequently.  Try to have someone else vacuum for you once or twice a week. Stay out of rooms while they are being vacuumed and for a short while afterward.  If you vacuum, use a dust mask from a hardware store, a  double-layered or microfilter vacuum cleaner bag, or a vacuum cleaner with a HEPA filter.  Sulfites in foods and beverages can be irritants. Do not drink beer or wine or eat dried fruit, processed potatoes, or shrimp if they cause asthma symptoms.  Cold air can trigger an asthma attack. Cover your nose and mouth with a scarf on cold or windy days.  Several health conditions can make asthma more difficult to manage, including a runny nose, sinus  infections, reflux disease, psychological stress, and sleep apnea. Work with your health care provider to manage these conditions.  Avoid close contact with people who have a respiratory infection such as a cold or the flu, since your asthma symptoms may get worse if you catch the infection. Wash your hands thoroughly after touching items that may have been handled by people with a respiratory infection.  Get a flu shot every year to protect against the flu virus, which often makes asthma worse for days or weeks. Also get a pneumonia shot if you have not previously had one. Unlike the flu shot, the pneumonia shot does not need to be given yearly. Medicines:  Talk to your health care provider about whether it is safe for you to take aspirin or non-steroidal anti-inflammatory medicines (NSAIDs). In a small number of people with asthma, aspirin and NSAIDs can cause asthma attacks. These medicines must be avoided by people who have known aspirin-sensitive asthma. It is important that people with aspirin-sensitive asthma read labels of all over-the-counter medicines used to treat pain, colds, coughs, and fever.  Beta-blockers and ACE inhibitors are other medicines you should discuss with your health care provider. HOW CAN I FIND OUT WHAT I AM ALLERGIC TO? Ask your asthma health care provider about allergy skin testing or blood testing (the RAST test) to identify the allergens to which you are sensitive. If you are found to have allergies, the most important thing  to do is to try to avoid exposure to any allergens that you are sensitive to as much as possible. Other treatments for allergies, such as medicines and allergy shots (immunotherapy) are available.  CAN I EXERCISE? Follow your health care provider's advice regarding asthma treatment before exercising. It is important to maintain a regular exercise program, but vigorous exercise or exercise in cold, humid, or dry environments can cause asthma attacks, especially for those people who have exercise-induced asthma. Document Released: 04/06/2009 Document Revised: 04/23/2013 Document Reviewed: 10/24/2012 [facility] Patient Information 2015 [place], [place]. This information is not intended to replace advice given to you by your health care provider. Make sure you discuss any questions you have with your health care provider.

## 2014-07-22 NOTE — Discharge Planning (Signed)
Patient discharged home in stable condition. Verbalizes understanding of all discharge instructions, including home medications and follow up appointments. 

## 2014-07-22 NOTE — Discharge Summary (Signed)
Physician Discharge Summary  Marvin Chandler KDX:[id number] DOB: 1951/01/17 DOA: 07/15/2014  PCP: Ruben Reason, MD  Admit date: 07/15/2014 Discharge date: 07/22/2014  Recommendations for Outpatient Follow-up:  1. Pt will need to follow up with PCP in 2-3 weeks post discharge 2. Please obtain BMP to evaluate electrolytes and kidney function 3. Please also check CBC to evaluate Hg and Hct levels 4. Please note that pt was advised to follow up with [facility], appointment scheduled and outlined in AVS 5. Pt advised to check his weight daily and to record it so that he can discuss this with his PCP or cardiologist 6. Pt also made aware he was started on lasix 40 mg PO QD 7. Pt to complete Prednisone tapering upon discharge   Discharge Diagnoses:  Principal Problem:   Asthma exacerbation Active Problems:   Pleuritic chest pain  Discharge Condition: Stable  Diet recommendation: Heart healthy diet discussed in details    Brief narrative:    64 y.o. male with a history of asthma, presented to [facility] ED with main concern of 2 days duration of progressively worsening dyspnea that initially started with exertion and progressed to dyspnea at rest over the past 24 hours, associated with productive cough of clear sputum and wheezing, chest tightness that is present with coughing spells. In ED, pt noted to be wheezing and has required nebulizer treatments multiple times, CXR concerning for developing PNA vs edema. TRH asked to admit for further evaluation.   Assessment/Plan:    Principal Problem:  Acute hypoxic respiratory failure - secondary to acute asthma exacerbation with possible perihilar PNA, vascular congestion, diastolic  - pt started on solumedrol initially as dyspnea appeared mostly related to asthma rather than vascular congestion  - respiratory status is better this AM, mild exertional dyspnea present but pt reports its better overall  - completed oral doxy for 5  days - continue BD's upon discharge  - CT chest with no PE - see diastolic CHF below   Active Problems:  Acute on chronic diastolic and systolic CHF - this is probably the reason for persistent dyspnea with exertion  - last 2 D ECHO in 2013 with grade II diastolic dysfunction - 2 D ECHO, with EF 45% which is essentially same as the stress test results in 2013  - weight trending: 102 -- > 104 --> 103 kg  - continue Lasix  - d/w cardiology on call, recommendation is to follow up in an outpatient setting  - may need stress test in near future   Pleuritic chest pain - possibly related to principal problem and diastolic CHF  - CT chest with no PE - pain resolved   Elevated troponin - possibly related to demand ischemia from principal problem, remain mildly elevated  - pt denied chest pain this am but still with mild exertional dyspnea  - CT chest angio with no PE - continue Lasix as noted above  - 2 D ECHO with slightly lower EF 45% but compared to stress test in 2013 essentially unchanged  - discussed with Dr. Radford Pax cardiologist, agrees with plan and recommends outpatient follow up   Acute renal failure - Cr now WNL  Code Status: Full.  Family Communication: plan of care discussed with the patient Disposition Plan: Home   IV access:  Peripheral IV  Procedures and diagnostic studies:   CXR 07/16/2014 Bibasilar and perihilar airspace opacity ? pulmonary edema vs PNA   Ct Angio Chest Pe W/cm &/or Wo Cm 07/17/2014 No evidence of  pulmonary embolism. CT findings consistent with largely resolved interstitial edema. Trace amount of right pleural fluid present.   Medical Consultants:  None   Other Consultants:  None   IAnti-Infectives:   Doxycycline 3/16 --> 3/20      Discharge Exam: Filed Vitals:   07/22/14 0527  BP: 148/90  Pulse: 79  Temp: 97.6 F (36.4 C)  Resp: 17   Filed Vitals:   07/21/14 2048 07/21/14 2115 07/22/14 0527  07/22/14 0722  BP: 126/72  148/90   Pulse: 82 87 79   Temp: 99 F (37.2 C)  97.6 F (36.4 C)   TempSrc: Oral  Oral   Resp: 17 18 17    Height:      Weight:   104.7 kg (230 lb 13.2 oz)   SpO2: 100% 100% 100% 100%    General: Pt is alert, follows commands appropriately, not in acute distress Cardiovascular: Regular rate and rhythm, no rubs, no gallops Respiratory: Clear to auscultation bilaterally, no wheezing, no crackles, no rhonchi Abdominal: Soft, non tender, non distended, bowel sounds +, no guarding Extremities: no edema, no cyanosis, pulses palpable bilaterally DP and PT Neuro: Grossly nonfocal  Discharge Instructions Follow-up Information    Follow up with HOPPER,DAVID, MD On 07/24/2014.   Specialty:  Family Medicine   Why:  come to the office on March 24th, 2016 between 8 am - 4 pm   Contact information:   [place] [address] [phone]       Follow up with Faye Ramsay, MD.   Specialty:  Internal Medicine   Why:  As needed call my cell phone [phone]   Contact information:   [address] [place] [address] [phone]       Follow up with Richardson Dopp, PA-C On 08/13/2014.   Specialty:  Physician Assistant   Why:  Apoointment scheduled on April 13th, 2016 at 11:10 am, please arrive at 11 am for registration    Contact information:   [address] [address] [phone]          Medication List    TAKE these medications        albuterol (2.5 MG/3ML) 0.083% nebulizer solution  Commonly known as:  PROVENTIL  Take 3 mLs (2.5 mg total) by nebulization every 6 (six) hours as needed for wheezing or shortness of breath.     albuterol 108 (90 BASE) MCG/ACT inhaler  Commonly known as:  PROVENTIL HFA  Inhale 2 puffs into the lungs every 4 (four) hours as needed for wheezing or shortness of breath.     aspirin 81 MG chewable tablet  Chew 162 mg by mouth daily.     diclofenac  75 MG EC tablet  Commonly known as:  VOLTAREN  TAKE 1 TABLET BY MOUTH TWICE DAILY.     Fluticasone-Salmeterol 250-50 MCG/DOSE Aepb  Commonly known as:  ADVAIR DISKUS  Inhale 1 puff into the lungs 2 (two) times daily.     furosemide 40 MG tablet  Commonly known as:  LASIX  Take 1 tablet (40 mg total) by mouth daily.     HYDROcodone-acetaminophen 5-325 MG per tablet  Commonly known as:  NORCO/VICODIN  Take 1 tablet by mouth every 6 (six) hours as needed for moderate pain.     montelukast 10 MG tablet  Commonly known as:  SINGULAIR  Take 1 tablet (10 mg total) by mouth at bedtime.     Nebulizer Compressor Misc  Use  nebulizer qid as needed     omeprazole 40 MG capsule  Commonly known as:  PRILOSEC  Take 1 capsule by mouth every morning on an empty stomach. Take 30 minutes before eating.     predniSONE 10 MG tablet  Commonly known as:  DELTASONE  Take 50 mg tablet today and taper down by 10 mg daily until completed     THERAFLU FLU/CHEST CONGESTION PO  Take 1 Package by mouth every 8 (eight) hours as needed (cough, cold).          The results of significant diagnostics from this hospitalization (including imaging, microbiology, ancillary and laboratory) are listed below for reference.     Microbiology: No results found for this or any previous visit (from the past 240 hour(s)).   Labs: Basic Metabolic Panel:  Recent Labs Lab 07/18/14 0413 07/19/14 0416 07/20/14 0443 07/21/14 0541 07/22/14 0358  NA 138 139 139 140 139  K 4.5 4.9 4.0 4.5 4.3  CL 101 102 97 98 101  CO2 30 32 35* 33* 30  GLUCOSE 133* 120* 117* 153* 129*  BUN 12 21 22 16 19   CREATININE 1.16 1.35 1.36* 1.17 1.12  CALCIUM 9.4 9.6 9.3 9.3 9.2   CBC:  Recent Labs Lab 07/15/14 2310  07/18/14 0413 07/19/14 0416 07/20/14 0443 07/21/14 0541 07/22/14 0358  WBC 7.5  < > 13.5* 16.6* 16.1* 14.4* 18.5*  NEUTROABS 5.1  --   --   --   --   --   --   HGB 13.9  < > 13.6 13.8 14.9 15.0 14.5  HCT 41.8   < > 41.5 43.0 45.9 45.6 45.1  MCV 87.8  < > 88.9 90.1 89.6 89.6 89.3  PLT 187  < > 260 249 251 238 257  < > = values in this interval not displayed. Cardiac Enzymes:  Recent Labs Lab 07/17/14 2230 07/18/14 1228 07/18/14 1725 07/18/14 2300 07/20/14 0443  TROPONINI 0.07* 0.06* 0.05* 0.06* 0.07*   BNP: BNP (last 3 results)  Recent Labs  07/18/14 1228 07/20/14 0443 07/22/14 0358  BNP 690.9* 298.6* 134.2*    SIGNED: Time coordinating discharge: Over 30 minutes  Faye Ramsay, MD  [facility] 07/22/2014, 11:05 AM Pager [phone]  If 7PM-7AM, please contact night-coverage [email or web address] Password [email or web address]

## 2014-07-25 ENCOUNTER — Ambulatory Visit (INDEPENDENT_AMBULATORY_CARE_PROVIDER_SITE_OTHER): Payer: BC Managed Care – PPO

## 2014-07-25 ENCOUNTER — Encounter: Payer: Self-pay | Admitting: *Deleted

## 2014-07-25 ENCOUNTER — Ambulatory Visit (INDEPENDENT_AMBULATORY_CARE_PROVIDER_SITE_OTHER): Payer: BC Managed Care – PPO | Admitting: Family Medicine

## 2014-07-25 VITALS — BP 108/62 | HR 83 | Temp 98.0°F | Resp 16 | Ht 71.0 in | Wt 238.0 lb

## 2014-07-25 DIAGNOSIS — R0600 Dyspnea, unspecified: Secondary | ICD-10-CM | POA: Diagnosis not present

## 2014-07-25 DIAGNOSIS — I5020 Unspecified systolic (congestive) heart failure: Secondary | ICD-10-CM

## 2014-07-25 DIAGNOSIS — J189 Pneumonia, unspecified organism: Secondary | ICD-10-CM | POA: Diagnosis not present

## 2014-07-25 DIAGNOSIS — J209 Acute bronchitis, unspecified: Secondary | ICD-10-CM

## 2014-07-25 LAB — BASIC METABOLIC PANEL
BUN: 21 mg/dL (ref 6–23)
CALCIUM: 9 mg/dL (ref 8.4–10.5)
CO2: 28 meq/L (ref 19–32)
Chloride: 102 mEq/L (ref 96–112)
Creat: 1.26 mg/dL (ref 0.50–1.35)
Glucose, Bld: 159 mg/dL — ABNORMAL HIGH (ref 70–99)
Potassium: 3.8 mEq/L (ref 3.5–5.3)
Sodium: 142 mEq/L (ref 135–145)

## 2014-07-25 LAB — POCT CBC
Granulocyte percent: 78.4 %G (ref 37–80)
HCT, POC: 45.2 % (ref 43.5–53.7)
Hemoglobin: 13.7 g/dL — AB (ref 14.1–18.1)
Lymph, poc: 1.8 (ref 0.6–3.4)
MCH, POC: 27.7 pg (ref 27–31.2)
MCHC: 30.3 g/dL — AB (ref 31.8–35.4)
MCV: 91.3 fL (ref 80–97)
MID (cbc): 1.3 — AB (ref 0–0.9)
MPV: 9.1 fL (ref 0–99.8)
POC GRANULOCYTE: 11.4 — AB (ref 2–6.9)
POC LYMPH %: 12.4 % (ref 10–50)
POC MID %: 9.2 % (ref 0–12)
Platelet Count, POC: 263 10*3/uL (ref 142–424)
RBC: 4.95 M/uL (ref 4.69–6.13)
RDW, POC: 17.3 %
WBC: 14.6 10*3/uL — AB (ref 4.6–10.2)

## 2014-07-25 MED ORDER — DOXYCYCLINE HYCLATE 100 MG PO CAPS: 100.0000 mg | ORAL_CAPSULE | Freq: Two times a day (BID) | ORAL | Status: DC

## 2014-07-25 NOTE — Patient Instructions (Signed)
Continue current medications, except I would recommend decreasing the anti-inflammatory medication you take for your shoulders and take it less often as it may be adding to some of the CHF problems.  Take the doxycycline one twice daily for antibiotic  Drink plenty of water to keep your secretions in.  Avoid excessive salt  Referral is being made to a lung specialist  Keep your appointment with a heart specialist  Return in 2 or 3 weeks if not much better, sooner at any time if necessary.

## 2014-07-25 NOTE — Progress Notes (Signed)
Subjective: Patient was hospitalized 10 days ago with what turned out to be congestive heart failure and probably some pneumonia. He received antibiotics in the hospital but was not sent home on antibiotics. He was sent home on a nebulizer machine, inhaler, and Lasix primarily. He also had steroids. He does not smoke anymore. 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 He has still been short of breath and coughing some. He has to lay on his side to sleep at night. He does not Craig out. He gets short of breath on exertion. He is not running any fevers there. He is not coughing up a lot of stuff going is cough some. He has been on Lasix since leaving the hospital.  Objective:  Visibly looks short of breath whenever he stirs at all. His throat is clear. Neck supple without nodes. Chest has poor air exchange but no rales or rhonchi. Heart regular without murmurs. He is obese.  Assessment: CHF Pneumonia Obesity Probable COPD Joint pains History of GERD  Plan: Chest x-ray, BNP, BMP, CBC  [facility] reading (PRIMARY) by  Dr. Linna Darner Lungs appear clear. Elevated right hemidiaphragm medially. Borderline cardiomegaly but no gross CHF.    Results for orders placed or performed in visit on 07/25/14  POCT CBC  Result Value Ref Range   WBC 14.6 (A) 4.6 - 10.2 K/uL   Lymph, poc 1.8 0.6 - 3.4   POC LYMPH PERCENT 12.4 10 - 50 %L   MID (cbc) 1.3 (A) 0 - 0.9   POC MID % 9.2 0 - 12 %M   POC Granulocyte 11.4 (A) 2 - 6.9   Granulocyte percent 78.4 37 - 80 %G   RBC 4.95 4.69 - 6.13 M/uL   Hemoglobin 13.7 (A) 14.1 - 18.1 g/dL   HCT, POC 45.2 43.5 - 53.7 %   MCV 91.3 80 - 97 fL   MCH, POC 27.7 27 - 31.2 pg   MCHC 30.3 (A) 31.8 - 35.4 g/dL   RDW, POC 17.3 %   Platelet Count, POC 263 142 - 424 K/uL   MPV 9.1 0 - 99.8 fL   Further questioning it does turn out that he has had some sweats and coughs up  some green phlegm at times when he gets coughing hard.  Assessment: CHF Status post pneumonia/bronchitis Dyspnea  Plan: We'll go ahead and give him a course of doxycycline case there is some infection in there causing the purulent phlegm and the elevation of white count. The elevated white count may be from his prednisone also.  Advise one more recheck in 2-3 weeks.  Will go ahead and refer him to pulmonary because I feel like he still just has very stiff lungs despite good oxygenation.

## 2014-07-26 LAB — BRAIN NATRIURETIC PEPTIDE: Brain Natriuretic Peptide: 171 pg/mL — ABNORMAL HIGH (ref 0.0–100.0)

## 2014-07-29 DIAGNOSIS — R079 Chest pain, unspecified: Secondary | ICD-10-CM | POA: Diagnosis not present

## 2014-08-13 ENCOUNTER — Telehealth: Payer: Self-pay | Admitting: *Deleted

## 2014-08-13 ENCOUNTER — Encounter: Payer: Self-pay | Admitting: *Deleted

## 2014-08-13 ENCOUNTER — Encounter: Payer: Self-pay | Admitting: Physician Assistant

## 2014-08-13 ENCOUNTER — Ambulatory Visit (INDEPENDENT_AMBULATORY_CARE_PROVIDER_SITE_OTHER): Payer: BC Managed Care – PPO | Admitting: Physician Assistant

## 2014-08-13 VITALS — BP 120/58 | HR 92 | Ht 71.0 in | Wt 230.0 lb

## 2014-08-13 DIAGNOSIS — I429 Cardiomyopathy, unspecified: Secondary | ICD-10-CM

## 2014-08-13 DIAGNOSIS — J45909 Unspecified asthma, uncomplicated: Secondary | ICD-10-CM | POA: Diagnosis not present

## 2014-08-13 DIAGNOSIS — I5022 Chronic systolic (congestive) heart failure: Secondary | ICD-10-CM

## 2014-08-13 LAB — CBC WITH DIFFERENTIAL/PLATELET
BASOS PCT: 0.4 % (ref 0.0–3.0)
Basophils Absolute: 0 10*3/uL (ref 0.0–0.1)
EOS ABS: 0.1 10*3/uL (ref 0.0–0.7)
Eosinophils Relative: 2.2 % (ref 0.0–5.0)
HCT: 39.2 % (ref 39.0–52.0)
HEMOGLOBIN: 13.1 g/dL (ref 13.0–17.0)
LYMPHS PCT: 30 % (ref 12.0–46.0)
Lymphs Abs: 1.9 10*3/uL (ref 0.7–4.0)
MCHC: 33.5 g/dL (ref 30.0–36.0)
MCV: 87.1 fl (ref 78.0–100.0)
MONO ABS: 0.4 10*3/uL (ref 0.1–1.0)
Monocytes Relative: 5.7 % (ref 3.0–12.0)
NEUTROS ABS: 3.9 10*3/uL (ref 1.4–7.7)
Neutrophils Relative %: 61.7 % (ref 43.0–77.0)
Platelets: 263 10*3/uL (ref 150.0–400.0)
RBC: 4.5 Mil/uL (ref 4.22–5.81)
RDW: 13.9 % (ref 11.5–15.5)
WBC: 6.3 10*3/uL (ref 4.0–10.5)

## 2014-08-13 LAB — PROTIME-INR
INR: 1 ratio (ref 0.8–1.0)
Prothrombin Time: 11.2 s (ref 9.6–13.1)

## 2014-08-13 LAB — BASIC METABOLIC PANEL
BUN: 13 mg/dL (ref 6–23)
CALCIUM: 9.3 mg/dL (ref 8.4–10.5)
CO2: 30 mEq/L (ref 19–32)
Chloride: 105 mEq/L (ref 96–112)
Creatinine, Ser: 1.19 mg/dL (ref 0.40–1.50)
GFR: 78.73 mL/min (ref >60.00)
GLUCOSE: 144 mg/dL — AB (ref 70–99)
Potassium: 3.8 mEq/L (ref 3.5–5.1)
SODIUM: 140 meq/L (ref 135–145)

## 2014-08-13 LAB — BRAIN NATRIURETIC PEPTIDE: PRO B NATRI PEPTIDE: 262 pg/mL — AB (ref 0.0–100.0)

## 2014-08-13 MED ORDER — NITROGLYCERIN 0.4 MG SL SUBL: 0.4000 mg | SUBLINGUAL_TABLET | SUBLINGUAL | Status: DC | PRN

## 2014-08-13 NOTE — Telephone Encounter (Signed)
pt notified of lab results with verbal understanding to results given today

## 2014-08-13 NOTE — Progress Notes (Signed)
Cardiology Office Note   Date:  08/13/2014   ID:  Marvin Chandler, DOB 05-29-1950, MRN [id number]  PCP:  Ruben Reason, MD  Cardiologist:  Dr. Lauree Chandler     Chief Complaint  Patient presents with  . Hospitalization Follow-up  . Cardiomyopathy     History of Present Illness: Marvin Chandler is a 64 y.o. male with a hx of asthma, hiatal hernia.  He was evaluated by Dr. Lauree Chandler in 2013 for chest pain.  Myoview demonstrated no ischemia.  EF was calculated at 43%.  Echo 8/13 demonstrated normal LVF with mild LVH.  Last seen by Dr. Lauree Chandler 04/10/14 for surgical clearance.    Admitted 3/15-3/22 with acute hypoxic respiratory failure in the setting of acute asthma exacerbation and possible pneumonia.  There was some concern for vascular congestion on CXR as well.  He was treated with IV Lasix as well as a combination of BD's, prednisone and antibiotics. He had some pleuritic chest pain.  CT was neg for PE.  He did have elevated CEs with a flat trend. This was thought to be demand ischemia.  He was not seen by Cardiology.  Echo did demonstrate worsening LVF with EF 45%.  OP Cardiology FU was recommended.  Since discharge, he has noted occasional episodes of sharp chest pain. He did call EMS on one occasion. He denies exertional chest symptoms. He does note dyspnea with minimal activity. This is unusual for him. He describes NYHA 2b-3 symptoms. He does admit to orthopnea. He denies PND. He denies significant LE edema. He denies syncope.   Recent Labs  07/15/14 2315  07/17/14 2230 07/18/14 1228 07/18/14 1725 07/18/14 2300 07/20/14 0443  TROPONINI  --   < > 0.07* 0.06* 0.05* 0.06* 0.07*  TROPIPOC 0.03  --   --   --   --   --   --   < > = values in this interval not displayed.    Studies/Reports Reviewed Today:  Chest CTA 07/17/14 IMPRESSION: 1. No evidence of pulmonary embolism. 2. CT findings consistent with largely resolved interstitial  edema.  Echo 07/20/14 - Hypokinesis inferior and inferolateral gements. Mild LVH. EF 45%. - Left atrium: The atrium was mildly to moderately dilated. - Right ventricle: The cavity size was normal. Systolic function was normal.  Myoview 01/06/12 Probable normal perfusion and minimal soft tissue attenuation.  No ischemia.  LV Ejection Fraction: 43%.  LV Wall Motion:  LVEF appears better than calculated 43%. Consider echo to evaluate.  Echo 8/13 EF 50-55%, normal WM, Gr 2 DD   Past Medical History  Diagnosis Date  . Asthma   . Cough   . Hiatal hernia   . Osteoarthritis   . Arthritis of hip     bilateral  . Personal history of colonic adenomas 02/20/2013  . Pneumonia     Past Surgical History  Procedure Laterality Date  . Total hip arthroplasty  2002    bilateral  . Colonoscopy  02/13/13     Current Outpatient Prescriptions  Medication Sig Dispense Refill  . Acetaminophen-Guaifenesin (THERAFLU FLU/CHEST CONGESTION PO) Take 1 Package by mouth every 8 (eight) hours as needed (cough, cold).    Marland Kitchen albuterol (PROVENTIL HFA) 108 (90 BASE) MCG/ACT inhaler Inhale 2 puffs into the lungs every 4 (four) hours as needed for wheezing or shortness of breath. 18 g 5  . albuterol (PROVENTIL) (2.5 MG/3ML) 0.083% nebulizer solution Take 3 mLs (2.5 mg total) by nebulization every 6 (six) hours as  needed for wheezing or shortness of breath. 300 mL 5  . aspirin 81 MG tablet Take 81 mg by mouth daily.    . diclofenac (VOLTAREN) 75 MG EC tablet TAKE 1 TABLET BY MOUTH TWICE DAILY. 60 tablet 3  . doxycycline (VIBRAMYCIN) 100 MG capsule Take 1 capsule (100 mg total) by mouth 2 (two) times daily. 20 capsule 0  . Fluticasone-Salmeterol (ADVAIR DISKUS) 250-50 MCG/DOSE AEPB Inhale 1 puff into the lungs 2 (two) times daily. 60 each 5  . furosemide (LASIX) 40 MG tablet Take 1 tablet (40 mg total) by mouth daily. 30 tablet 1  . HYDROcodone-acetaminophen (NORCO/VICODIN) 5-325 MG per tablet Take 1 tablet by mouth  every 6 (six) hours as needed for moderate pain. 65 tablet 0  . montelukast (SINGULAIR) 10 MG tablet Take 1 tablet (10 mg total) by mouth at bedtime. 30 tablet 11  . Nebulizers (NEBULIZER COMPRESSOR) MISC Use nebulizer qid as needed (Patient taking differently: Use nebulizer four times a day as needed for shortness of breath or wheezing) 1 each 0  . omeprazole (PRILOSEC) 40 MG capsule Take 1 capsule by mouth every morning on an empty stomach. Take 30 minutes before eating. 30 capsule 3   No current facility-administered medications for this visit.    Allergies:   Review of patient's allergies indicates no known allergies.    Social History:  The patient  reports that he quit smoking about 16 years ago. His smoking use included Cigarettes. He has a 7 pack-year smoking history. He has never used smokeless tobacco. He reports that he does not drink alcohol or use illicit drugs.   Family History:  The patient's family history includes Cancer in an other family member; Dementia in his mother; Stroke in his cousin. There is no history of Colon cancer, Esophageal cancer, or Heart attack.    ROS:   Please see the history of present illness.   Review of Systems  Cardiovascular: Positive for chest pain, dyspnea on exertion, leg swelling, orthopnea and paroxysmal nocturnal dyspnea.  Respiratory: Positive for cough and wheezing.   All other systems reviewed and are negative.    PHYSICAL EXAM: VS:  BP 120/58 mmHg  Pulse 92  Ht 5\' 11"  (1.803 m)  Wt 230 lb (104.327 kg)  BMI 32.09 kg/m2    Wt Readings from Last 3 Encounters:  08/13/14 230 lb (104.327 kg)  07/25/14 238 lb (107.956 kg)  07/22/14 230 lb 13.2 oz (104.7 kg)     GEN: Well nourished, well developed, in no acute distress HEENT: normal Neck: no JVD, no carotid bruits, no masses Cardiac:  Normal S1/S2, RRR; no murmur ,  no rubs or gallops, no edema  Respiratory:  clear to auscultation bilaterally, no wheezing, rhonchi or rales. GI:  soft, nontender, nondistended, + BS MS: no deformity or atrophy Skin: warm and dry  Neuro:  CNs II-XII intact, Strength and sensation are intact Psych: Normal affect   EKG:  EKG is ordered today.  It demonstrates:   NSR, HR 92, NSSTTW changes, no change from prior tracings   Recent Labs: 12/31/2013: TSH 2.179 02/06/2014: ALT 15 07/22/2014: B Natriuretic Peptide 134.2*; Platelets 257 07/25/2014: BUN 21; Creatinine 1.26; Hemoglobin 13.7*; Potassium 3.8; Sodium 142    Lipid Panel    Component Value Date/Time   CHOL 189 12/31/2013 1518   TRIG 209* 12/31/2013 1518   HDL 36* 12/31/2013 1518   CHOLHDL 5.3 12/31/2013 1518   VLDL 42* 12/31/2013 1518   LDLCALC 111* 12/31/2013 1518  ASSESSMENT AND PLAN:  Cardiomyopathy  He has evidence of reduced LV function on recent echocardiogram. He had minimally elevated troponins during his recent admission. There was evidence of hypokinesis of the inferior and inferolateral walls.  He now has shortness of breath with minimal activity. He's also had some chest pains. He needs further assessment for ischemic heart disease. I have recommended cardiac catheterization. I did review this with Dr. Ena Dawley (DOD) who agreed.  Continue aspirin.  Give RX for prn NTG.  -  Risks and benefits of R and L cardiac catheterization have been discussed with the patient.  These include bleeding, infection, kidney damage, stroke, heart attack, death.  The patient understands these risks and is willing to proceed.   -  Labs today:  BMET, CBC, BNP, INR.  Chronic systolic CHF (congestive heart failure) He is NYHA IIb-3. Volume appears fairly stable on exam. Check BNP today as outlined above. Adjust Lasix if needed. We'll plan on adjusting medications for CHF after his cardiac catheterization. I did not want to start beta blockers at this time given his recent asthma exacerbation. I do not want to start ACE inhibitor or ARB now given) proximal D2 is cardiac  catheterization.  Asthma, chronic, unspecified asthma severity, uncomplicated  FU with Pulmonology as planned.    Current medicines are reviewed at length with the patient today.  The patient does not have concerns regarding medicines.  The following changes have been made:  As above.   Labs/ tests ordered today include:   Orders Placed This Encounter  Procedures  . Basic Metabolic Panel (BMET)  . INR/PT  . CBC w/Diff  . B Nat Peptide  . EKG 12-Lead    Disposition:   FU with me or Dr. Lauree Chandler after his LHC.   Signed, Versie Starks, MHS 08/13/2014 11:22 AM    [facility] [place], [place], [place]  [address] Phone: [phone]; Fax: [phone]

## 2014-08-13 NOTE — Patient Instructions (Signed)
Medication Instructions:  AN RX FOR NTG HAS BEEN SENT IN FOR YOU TODAY; YOU HAVE BEEN ADVISED AS TO HOW AND WHEN TO USE NTG  Labwork: TODAY PRE CATH; BMET, CBC W/DIFF, PT/INR, BNP  Testing/Procedures: YOU ARE SCHEDULED FOR A RIGHT AND LEFT HEART CATH 08/18/14 @ 10:30 SEE YOUR INSTRUCTIONS SHEET  Follow-Up: YOU HAVE A FOLLOW UP WITH SCOTT WEAVER, [facility] ON 09/10/14 @ 11:10 POST CATH APPT   Any Other Special Instructions Will Be Listed Below (If Applicable).

## 2014-08-13 NOTE — H&P (Signed)
History and Physical   Date:  08/13/2014   ID:  Marvin Chandler, DOB 1950/12/23, MRN [id number]  PCP:  Ruben Reason, MD  Cardiologist:  Dr. Lauree Chandler     Chief Complaint  Patient presents with  . Hospitalization Follow-up  . Cardiomyopathy     History of Present Illness: Marvin Chandler is a 64 y.o. male with a hx of asthma, hiatal hernia.  He was evaluated by Dr. Lauree Chandler in 2013 for chest pain.  Myoview demonstrated no ischemia.  EF was calculated at 43%.  Echo 8/13 demonstrated normal LVF with mild LVH.  Last seen by Dr. Lauree Chandler 04/10/14 for surgical clearance.    Admitted 3/15-3/22 with acute hypoxic respiratory failure in the setting of acute asthma exacerbation and possible pneumonia.  There was some concern for vascular congestion on CXR as well.  He was treated with IV Lasix as well as a combination of BD's, prednisone and antibiotics. He had some pleuritic chest pain.  CT was neg for PE.  He did have elevated CEs with a flat trend. This was thought to be demand ischemia.  He was not seen by Cardiology.  Echo did demonstrate worsening LVF with EF 45%.  OP Cardiology FU was recommended.  Since discharge, he has noted occasional episodes of sharp chest pain. He did call EMS on one occasion. He denies exertional chest symptoms. He does note dyspnea with minimal activity. This is unusual for him. He describes NYHA 2b-3 symptoms. He does admit to orthopnea. He denies PND. He denies significant LE edema. He denies syncope.   Recent Labs  07/15/14 2315  07/17/14 2230 07/18/14 1228 07/18/14 1725 07/18/14 2300 07/20/14 0443  TROPONINI  --   < > 0.07* 0.06* 0.05* 0.06* 0.07*  TROPIPOC 0.03  --   --   --   --   --   --   < > = values in this interval not displayed.    Studies/Reports Reviewed Today:  Chest CTA 07/17/14 IMPRESSION: 1. No evidence of pulmonary embolism. 2. CT findings consistent with largely resolved interstitial  edema.  Echo 07/20/14 - Hypokinesis inferior and inferolateral gements. Mild LVH. EF 45%. - Left atrium: The atrium was mildly to moderately dilated. - Right ventricle: The cavity size was normal. Systolic function was normal.  Myoview 01/06/12 Probable normal perfusion and minimal soft tissue attenuation.  No ischemia.  LV Ejection Fraction: 43%.  LV Wall Motion:  LVEF appears better than calculated 43%. Consider echo to evaluate.  Echo 8/13 EF 50-55%, normal WM, Gr 2 DD   Past Medical History  Diagnosis Date  . Asthma   . Cough   . Hiatal hernia   . Osteoarthritis   . Arthritis of hip     bilateral  . Personal history of colonic adenomas 02/20/2013  . Pneumonia     Past Surgical History  Procedure Laterality Date  . Total hip arthroplasty  2002    bilateral  . Colonoscopy  02/13/13     Current Outpatient Prescriptions  Medication Sig Dispense Refill  . Acetaminophen-Guaifenesin (THERAFLU FLU/CHEST CONGESTION PO) Take 1 Package by mouth every 8 (eight) hours as needed (cough, cold).    Marland Kitchen albuterol (PROVENTIL HFA) 108 (90 BASE) MCG/ACT inhaler Inhale 2 puffs into the lungs every 4 (four) hours as needed for wheezing or shortness of breath. 18 g 5  . albuterol (PROVENTIL) (2.5 MG/3ML) 0.083% nebulizer solution Take 3 mLs (2.5 mg total) by nebulization every 6 (six) hours as  needed for wheezing or shortness of breath. 300 mL 5  . aspirin 81 MG tablet Take 81 mg by mouth daily.    . diclofenac (VOLTAREN) 75 MG EC tablet TAKE 1 TABLET BY MOUTH TWICE DAILY. 60 tablet 3  . doxycycline (VIBRAMYCIN) 100 MG capsule Take 1 capsule (100 mg total) by mouth 2 (two) times daily. 20 capsule 0  . Fluticasone-Salmeterol (ADVAIR DISKUS) 250-50 MCG/DOSE AEPB Inhale 1 puff into the lungs 2 (two) times daily. 60 each 5  . furosemide (LASIX) 40 MG tablet Take 1 tablet (40 mg total) by mouth daily. 30 tablet 1  . HYDROcodone-acetaminophen (NORCO/VICODIN) 5-325 MG per tablet Take 1 tablet by mouth  every 6 (six) hours as needed for moderate pain. 65 tablet 0  . montelukast (SINGULAIR) 10 MG tablet Take 1 tablet (10 mg total) by mouth at bedtime. 30 tablet 11  . Nebulizers (NEBULIZER COMPRESSOR) MISC Use nebulizer qid as needed (Patient taking differently: Use nebulizer four times a day as needed for shortness of breath or wheezing) 1 each 0  . omeprazole (PRILOSEC) 40 MG capsule Take 1 capsule by mouth every morning on an empty stomach. Take 30 minutes before eating. 30 capsule 3   No current facility-administered medications for this visit.    Allergies:   Review of patient's allergies indicates no known allergies.    Social History:  The patient  reports that he quit smoking about 16 years ago. His smoking use included Cigarettes. He has a 7 pack-year smoking history. He has never used smokeless tobacco. He reports that he does not drink alcohol or use illicit drugs.   Family History:  The patient's family history includes Cancer in an other family member; Dementia in his mother; Stroke in his cousin. There is no history of Colon cancer, Esophageal cancer, or Heart attack.    ROS:   Please see the history of present illness.   Review of Systems  Cardiovascular: Positive for chest pain, dyspnea on exertion, leg swelling, orthopnea and paroxysmal nocturnal dyspnea.  Respiratory: Positive for cough and wheezing.   All other systems reviewed and are negative.    PHYSICAL EXAM: VS:  BP 120/58 mmHg  Pulse 92  Ht 5\' 11"  (1.803 m)  Wt 230 lb (104.327 kg)  BMI 32.09 kg/m2    Wt Readings from Last 3 Encounters:  08/13/14 230 lb (104.327 kg)  07/25/14 238 lb (107.956 kg)  07/22/14 230 lb 13.2 oz (104.7 kg)     GEN: Well nourished, well developed, in no acute distress HEENT: normal Neck: no JVD, no carotid bruits, no masses Cardiac:  Normal S1/S2, RRR; no murmur ,  no rubs or gallops, no edema  Respiratory:  clear to auscultation bilaterally, no wheezing, rhonchi or rales. GI:  soft, nontender, nondistended, + BS MS: no deformity or atrophy Skin: warm and dry  Neuro:  CNs II-XII intact, Strength and sensation are intact Psych: Normal affect   EKG:  EKG is ordered today.  It demonstrates:   NSR, HR 92, NSSTTW changes, no change from prior tracings   Recent Labs: 12/31/2013: TSH 2.179 02/06/2014: ALT 15 07/22/2014: B Natriuretic Peptide 134.2*; Platelets 257 07/25/2014: BUN 21; Creatinine 1.26; Hemoglobin 13.7*; Potassium 3.8; Sodium 142    Lipid Panel    Component Value Date/Time   CHOL 189 12/31/2013 1518   TRIG 209* 12/31/2013 1518   HDL 36* 12/31/2013 1518   CHOLHDL 5.3 12/31/2013 1518   VLDL 42* 12/31/2013 1518   LDLCALC 111* 12/31/2013 1518  ASSESSMENT AND PLAN:  Cardiomyopathy  He has evidence of reduced LV function on recent echocardiogram. He had minimally elevated troponins during his recent admission. There was evidence of hypokinesis of the inferior and inferolateral walls.  He now has shortness of breath with minimal activity. He's also had some chest pains. He needs further assessment for ischemic heart disease. I have recommended cardiac catheterization. I did review this with Dr. Ena Dawley (DOD) who agreed.  Continue aspirin.  Give RX for prn NTG.  -  Risks and benefits of R and L cardiac catheterization have been discussed with the patient.  These include bleeding, infection, kidney damage, stroke, heart attack, death.  The patient understands these risks and is willing to proceed.   -  Labs today:  BMET, CBC, BNP, INR.  Chronic systolic CHF (congestive heart failure) He is NYHA IIb-3. Volume appears fairly stable on exam. Check BNP today as outlined above. Adjust Lasix if needed. We'll plan on adjusting medications for CHF after his cardiac catheterization. I did not want to start beta blockers at this time given his recent asthma exacerbation. I do not want to start ACE inhibitor or ARB now given) proximal D2 is cardiac  catheterization.  Asthma, chronic, unspecified asthma severity, uncomplicated  FU with Pulmonology as planned.    Current medicines are reviewed at length with the patient today.  The patient does not have concerns regarding medicines.  The following changes have been made:  As above.   Labs/ tests ordered today include:   Orders Placed This Encounter  Procedures  . Basic Metabolic Panel (BMET)  . INR/PT  . CBC w/Diff  . B Nat Peptide  . EKG 12-Lead    Disposition:   FU with me or Dr. Lauree Chandler after his LHC.   Signed, Versie Starks, MHS 08/13/2014 11:22 AM    [facility] [place], [place],   [address] Phone: [phone]; Fax: [phone]

## 2014-08-14 ENCOUNTER — Encounter (HOSPITAL_COMMUNITY): Payer: Self-pay | Admitting: Interventional Cardiology

## 2014-08-14 ENCOUNTER — Encounter: Payer: Self-pay | Admitting: Physician Assistant

## 2014-08-15 ENCOUNTER — Encounter: Payer: Self-pay | Admitting: Internal Medicine

## 2014-08-15 ENCOUNTER — Other Ambulatory Visit: Payer: BC Managed Care – PPO

## 2014-08-15 ENCOUNTER — Ambulatory Visit (INDEPENDENT_AMBULATORY_CARE_PROVIDER_SITE_OTHER): Payer: BC Managed Care – PPO | Admitting: Internal Medicine

## 2014-08-15 VITALS — BP 118/88 | HR 72 | Ht 71.0 in | Wt 230.6 lb

## 2014-08-15 DIAGNOSIS — R042 Hemoptysis: Secondary | ICD-10-CM | POA: Diagnosis not present

## 2014-08-15 DIAGNOSIS — Z8709 Personal history of other diseases of the respiratory system: Secondary | ICD-10-CM | POA: Diagnosis not present

## 2014-08-15 DIAGNOSIS — R05 Cough: Secondary | ICD-10-CM | POA: Diagnosis not present

## 2014-08-15 DIAGNOSIS — R053 Chronic cough: Secondary | ICD-10-CM | POA: Insufficient documentation

## 2014-08-15 MED ORDER — PREDNISONE 10 MG PO TABS
ORAL_TABLET | ORAL | Status: DC
Start: 2014-08-15 — End: 2014-08-29

## 2014-08-15 MED ORDER — FLUTICASONE FUROATE-VILANTEROL 200-25 MCG/INH IN AEPB: 1.0000 | INHALATION_SPRAY | Freq: Every day | RESPIRATORY_TRACT | Status: AC

## 2014-08-15 MED ORDER — FLUTICASONE FUROATE-VILANTEROL 200-25 MCG/INH IN AEPB: 1.0000 | INHALATION_SPRAY | Freq: Every day | RESPIRATORY_TRACT | Status: DC

## 2014-08-15 NOTE — Progress Notes (Signed)
Subjective:    Patient ID: Marvin Chandler, male    DOB: 09-10-1950, 64 y.o.   MRN: [id number]  PCP HOPPER,DAVID, MD   HPI IOV 08/15/2014  Chief Complaint  Patient presents with  . Pulmonary Consult    Pt c/o of SOB easily with activity, fatigue, and some wheezing. Prod cough x2 weeks, Pt states last week was bloody mucus, this week thick green/clear mucus. Denies any chest tightness/congestion. Uses neb and inhalers daily.   64 year old well-built African-American male very limited smoker in the past. Works as a Advertising copywriter. Reports a diagnosis of asthma made 15 years ago by a pulmonologist in [place] following acute symptoms. At that point he was treated with albuterol as needed. Sometime later he moved to [place], [place]. Than 7 years ago when his son died he had a flareup of asthma and was treated in the emergency department not otherwise specified. Details unknown. Than 3 years ago he reports another exacerbation with cough wheezing shortness of breath and chest tightness. At that point in time he was prescribed Advair which she has taken it regularly probably around 3 times a week as needed. Overall he felt his quality-of-life is really good. Then some 2 months ago he started having worsening cough that he attributes her asthma but as this progressed he was admitted ed 3/15-3/22 with acute hypoxic respiratory failure i EF45% - drop from 55% in 2013 (2013 myoview normal). , CT angio chest 07/17/14 : 1. No evidence of pulmonary embolism.2. CT findings consistent with largely resolved interstitial edema.Trace amount of right pleural fluid present.Marland Kitchen BNP at admiot 500 and dropped to 134 at discharge 07/22/14. Correlating with this CXR resolved from 3?!516 to 3/252/16 with pulm edema findings. Clinically now he has a cardiac cath pending. To me the admission sounded like acute systolic heart failure exacerbation with pulmonary edema but the discharge diagnosis states acute  asthma exacerbation and possible pneumonia  Since discharge he is now 60% better. Asthma control questionnaire (ACQ) for the last 1 week shows a score of 2 which is some amount of active asthma symptoms. Specifically he says that he is not woken up hardly ever in the last 1 week because of asthma. He's having only mild symptoms due to asthma in the morning. He is only slightly limited with his activities because of asthma but he has moderate amount of shortness of breath with exertion but he has wheezed only a little of the time. For the symptoms his use albuterol 3-4 puffs every day  In addition he is reporting hemoptysis a week ago for 1 week mixed in his mucous. Currently mucus is green/white. His baseline and he does not feel sick  Denies sinus issues  Has large hiatal hernia and has gerd +  Exhaled NO today in office (FeNO) Niox is 78 ppb and vERY HIGH -suggestive of active asthma (done while on advair 250/50 and singulair)   .     has a past medical history of Asthma; Cough; Hiatal hernia; Osteoarthritis; Arthritis of hip; Personal history of colonic adenomas (02/20/2013); and Pneumonia.   reports that he quit smoking about 16 years ago. His smoking use included Cigarettes. He has a 7 pack-year smoking history. He has never used smokeless tobacco.  Past Surgical History  Procedure Laterality Date  . Total hip arthroplasty  2002    bilateral  . Colonoscopy  02/13/13    No Known Allergies  Immunization History  Administered Date(s) Administered  . Influenza,inj,Quad PF,36+  Mos 07/17/2014  . Pneumococcal Polysaccharide-23 07/17/2014  . Tdap 12/31/2013    Family History  Problem Relation Age of Onset  . Dementia Mother   . Colon cancer Neg Hx   . Esophageal cancer Neg Hx   . Cancer    . Heart attack Neg Hx   . Stroke Cousin      Current outpatient prescriptions:  .  Acetaminophen-Guaifenesin (THERAFLU FLU/CHEST CONGESTION PO), Take 1 Package by mouth every 8 (eight)  hours as needed (cough, cold)., Disp: , Rfl:  .  albuterol (PROVENTIL HFA) 108 (90 BASE) MCG/ACT inhaler, Inhale 2 puffs into the lungs every 4 (four) hours as needed for wheezing or shortness of breath., Disp: 18 g, Rfl: 5 .  albuterol (PROVENTIL) (2.5 MG/3ML) 0.083% nebulizer solution, Take 3 mLs (2.5 mg total) by nebulization every 6 (six) hours as needed for wheezing or shortness of breath., Disp: 300 mL, Rfl: 5 .  aspirin 81 MG tablet, Take 81 mg by mouth daily., Disp: , Rfl:  .  diclofenac (VOLTAREN) 75 MG EC tablet, TAKE 1 TABLET BY MOUTH TWICE DAILY., Disp: 60 tablet, Rfl: 3 .  doxycycline (VIBRAMYCIN) 100 MG capsule, Take 1 capsule (100 mg total) by mouth 2 (two) times daily., Disp: 20 capsule, Rfl: 0 .  Fluticasone-Salmeterol (ADVAIR DISKUS) 250-50 MCG/DOSE AEPB, Inhale 1 puff into the lungs 2 (two) times daily., Disp: 60 each, Rfl: 5 .  furosemide (LASIX) 40 MG tablet, Take 1 tablet (40 mg total) by mouth daily., Disp: 30 tablet, Rfl: 1 .  HYDROcodone-acetaminophen (NORCO/VICODIN) 5-325 MG per tablet, Take 1 tablet by mouth every 6 (six) hours as needed for moderate pain., Disp: 65 tablet, Rfl: 0 .  montelukast (SINGULAIR) 10 MG tablet, Take 1 tablet (10 mg total) by mouth at bedtime., Disp: 30 tablet, Rfl: 11 .  Nebulizers (NEBULIZER COMPRESSOR) MISC, Use nebulizer qid as needed (Patient taking differently: Use nebulizer four times a day as needed for shortness of breath or wheezing), Disp: 1 each, Rfl: 0 .  nitroGLYCERIN (NITROSTAT) 0.4 MG SL tablet, Place 1 tablet (0.4 mg total) under the tongue every 5 (five) minutes as needed., Disp: 25 tablet, Rfl: 3 .  omeprazole (PRILOSEC) 40 MG capsule, Take 1 capsule by mouth every morning on an empty stomach. Take 30 minutes before eating., Disp: 30 capsule, Rfl: 3     Review of Systems  Constitutional: Negative for fever and unexpected weight change.  HENT: Positive for dental problem. Negative for congestion, ear pain, nosebleeds, postnasal  drip, rhinorrhea, sinus pressure, sneezing, sore throat and trouble swallowing.   Eyes: Negative for redness and itching.  Respiratory: Positive for cough, shortness of breath and wheezing. Negative for chest tightness.   Cardiovascular: Negative for palpitations and leg swelling.  Gastrointestinal: Negative for nausea and vomiting.  Genitourinary: Negative for dysuria.  Musculoskeletal: Negative for joint swelling.  Skin: Negative for rash.  Neurological: Negative for headaches.  Hematological: Does not bruise/bleed easily.  Psychiatric/Behavioral: Negative for dysphoric mood. The patient is not nervous/anxious.        Objective:   Physical Exam  Constitutional: He is oriented to person, place, and time. He appears well-developed and well-nourished. No distress.  HENT:  Head: Normocephalic and atraumatic.  Right Ear: External ear normal.  Left Ear: External ear normal.  Mouth/Throat: Oropharynx is clear and moist. No oropharyngeal exudate.  Eyes: Conjunctivae and EOM are normal. Pupils are equal, round, and reactive to light. Right eye exhibits no discharge. Left eye exhibits no discharge. No  scleral icterus.  Neck: Normal range of motion. Neck supple. No JVD present. No tracheal deviation present. No thyromegaly present.  Cardiovascular: Normal rate, regular rhythm and intact distal pulses.  Exam reveals no gallop and no friction rub.   No murmur heard. Pulmonary/Chest: Effort normal and breath sounds normal. No respiratory distress. He has no wheezes. He has no rales. He exhibits no tenderness.  ? Diminished air entry  Abdominal: Soft. Bowel sounds are normal. He exhibits no distension and no mass. There is no tenderness. There is no rebound and no guarding.  Musculoskeletal: Normal range of motion. He exhibits no edema or tenderness.  Lymphadenopathy:    He has no cervical adenopathy.  Neurological: He is alert and oriented to person, place, and time. He has normal reflexes. No  cranial nerve deficit. Coordination normal.  Skin: Skin is warm and dry. No rash noted. He is not diaphoretic. No erythema. No pallor.  Psychiatric: He has a normal mood and affect. His behavior is normal. Judgment and thought content normal.  Nursing note and vitals reviewed.  Filed Vitals:   08/15/14 1118 08/15/14 1119  BP: 118/88 118/88  Pulse:  72  Height:  5\' 11"  (1.803 m)  Weight:  230 lb 9.6 oz (104.599 kg)  SpO2:  97%          Assessment & Plan:     ICD-9-CM ICD-10-CM   1. History of asthma V12.69 Z87.09   2. Hemoptysis 786.30 R04.2   3. Chronic cough 786.2 R05     In retrospect I think the march admission 2016 was all heart failure with asthma exacerbation   Glad you are better  Current symptoms are most likely asthma becuaseExhaled Nitric Oxide is very high at 78 You might have some residual exacerbation    PLAN Change advair to HIGH dose Breo 1 puff daily Continue singulair Please take prednisone 40 mg x1 day, then 30 mg x1 day, then 20 mg x1 day, then 10 mg x1 day, and then 5 mg x1 day and stop Do IgE blood test today Do full PFT  In 2 weeks Ok for cardiac cath in few days if you are stable    Dr. Brand Males, M.D., [facility].C.P Pulmonary and Critical Care Medicine Staff Physician [facility] Pager: [phone], If no answer or between  15:00h - 7:00h: call [phone]  08/15/2014 12:44 PM

## 2014-08-15 NOTE — Patient Instructions (Addendum)
ICD-9-CM ICD-10-CM   1. History of asthma V12.69 Z87.09   2. Hemoptysis 786.30 R04.2   3. Chronic cough 786.2 R05    In retrospect I think the march admission 2016 was all heart failure with asthma exacerbation   Glad you are better  Current symptoms are most likely asthma becuaseExhaled Nitric Oxide is very high at 3 You might have some residual exacerbation    PLAN Change advair to HIGH dose Breo 1 puff daily Continue singulair Please take prednisone 40 mg x1 day, then 30 mg x1 day, then 20 mg x1 day, then 10 mg x1 day, and then 5 mg x1 day and stop Do IgE blood test today Do full PFT  In 2 weeks Ok for cardiac cath in few days if you are stable   Followup  return to see me or my NP in 2 weeks after completion of above At followup  - do ACQ and Exhaled NO and decide on step up to spiriva +/- asthma trial   - consider PPI at followup

## 2014-08-18 ENCOUNTER — Encounter (HOSPITAL_COMMUNITY)
Admission: RE | Disposition: A | Discharge status: Home / Self Care | Payer: Self-pay | Source: Ambulatory Visit | Attending: Interventional Cardiology

## 2014-08-18 ENCOUNTER — Encounter (HOSPITAL_COMMUNITY): Payer: Self-pay | Admitting: Interventional Cardiology

## 2014-08-18 ENCOUNTER — Ambulatory Visit (HOSPITAL_COMMUNITY)
Admission: RE | Admit: 2014-08-18 | Discharge: 2014-08-18 | Disposition: A | Discharge status: Home / Self Care | Payer: BC Managed Care – PPO | Source: Ambulatory Visit | Attending: Interventional Cardiology | Admitting: Interventional Cardiology

## 2014-08-18 DIAGNOSIS — Z7982 Long term (current) use of aspirin: Secondary | ICD-10-CM | POA: Diagnosis not present

## 2014-08-18 DIAGNOSIS — J45909 Unspecified asthma, uncomplicated: Secondary | ICD-10-CM | POA: Diagnosis not present

## 2014-08-18 DIAGNOSIS — I25110 Atherosclerotic heart disease of native coronary artery with unstable angina pectoris: Secondary | ICD-10-CM | POA: Diagnosis not present

## 2014-08-18 DIAGNOSIS — I5022 Chronic systolic (congestive) heart failure: Secondary | ICD-10-CM | POA: Diagnosis not present

## 2014-08-18 DIAGNOSIS — Z79891 Long term (current) use of opiate analgesic: Secondary | ICD-10-CM | POA: Diagnosis not present

## 2014-08-18 DIAGNOSIS — Z87891 Personal history of nicotine dependence: Secondary | ICD-10-CM | POA: Diagnosis not present

## 2014-08-18 DIAGNOSIS — Z96643 Presence of artificial hip joint, bilateral: Secondary | ICD-10-CM | POA: Insufficient documentation

## 2014-08-18 DIAGNOSIS — Z792 Long term (current) use of antibiotics: Secondary | ICD-10-CM | POA: Insufficient documentation

## 2014-08-18 DIAGNOSIS — Z791 Long term (current) use of non-steroidal anti-inflammatories (NSAID): Secondary | ICD-10-CM | POA: Diagnosis not present

## 2014-08-18 DIAGNOSIS — I429 Cardiomyopathy, unspecified: Secondary | ICD-10-CM | POA: Diagnosis not present

## 2014-08-18 DIAGNOSIS — M1990 Unspecified osteoarthritis, unspecified site: Secondary | ICD-10-CM | POA: Diagnosis not present

## 2014-08-18 DIAGNOSIS — I2510 Atherosclerotic heart disease of native coronary artery without angina pectoris: Secondary | ICD-10-CM | POA: Insufficient documentation

## 2014-08-18 DIAGNOSIS — Z79899 Other long term (current) drug therapy: Secondary | ICD-10-CM | POA: Diagnosis not present

## 2014-08-18 DIAGNOSIS — R0600 Dyspnea, unspecified: Secondary | ICD-10-CM | POA: Diagnosis present

## 2014-08-18 DIAGNOSIS — R079 Chest pain, unspecified: Secondary | ICD-10-CM | POA: Diagnosis present

## 2014-08-18 DIAGNOSIS — Z7951 Long term (current) use of inhaled steroids: Secondary | ICD-10-CM | POA: Diagnosis not present

## 2014-08-18 HISTORY — PX: LEFT AND RIGHT HEART CATHETERIZATION WITH CORONARY ANGIOGRAM: SHX5449

## 2014-08-18 LAB — POCT I-STAT 3, VENOUS BLOOD GAS (G3P V)
BICARBONATE: 27 meq/L — AB (ref 20.0–24.0)
O2 Saturation: 67 %
TCO2: 29 mmol/L (ref 0–100)
pCO2, Ven: 51.4 mmHg — ABNORMAL HIGH (ref 45.0–50.0)
pH, Ven: 7.328 — ABNORMAL HIGH (ref 7.250–7.300)
pO2, Ven: 38 mmHg (ref 30.0–45.0)

## 2014-08-18 LAB — POCT I-STAT 3, ART BLOOD GAS (G3+)
Acid-Base Excess: 3 mmol/L — ABNORMAL HIGH (ref 0.0–2.0)
BICARBONATE: 29.6 meq/L — AB (ref 20.0–24.0)
O2 Saturation: 99 %
PCO2 ART: 51.4 mmHg — AB (ref 35.0–45.0)
PH ART: 7.368 (ref 7.350–7.450)
PO2 ART: 127 mmHg — AB (ref 80.0–100.0)
TCO2: 31 mmol/L (ref 0–100)

## 2014-08-18 SURGERY — LEFT AND RIGHT HEART CATHETERIZATION WITH CORONARY ANGIOGRAM

## 2014-08-18 MED ORDER — FENTANYL CITRATE (PF) 100 MCG/2ML IJ SOLN
INTRAMUSCULAR | Status: AC
  Filled 2014-08-18: qty 2

## 2014-08-18 MED ORDER — SODIUM CHLORIDE 0.9 % IJ SOLN: 3.0000 mL | Freq: Two times a day (BID) | INTRAMUSCULAR | Status: DC

## 2014-08-18 MED ORDER — HEPARIN (PORCINE) IN NACL 2-0.9 UNIT/ML-% IJ SOLN
INTRAMUSCULAR | Status: AC
  Filled 2014-08-18: qty 500

## 2014-08-18 MED ORDER — LOSARTAN POTASSIUM 50 MG PO TABS: 50.0000 mg | ORAL_TABLET | Freq: Every day | ORAL | Status: DC

## 2014-08-18 MED ORDER — SODIUM CHLORIDE 0.9 % IV SOLN: 250.0000 mL | INTRAVENOUS | Status: DC | PRN

## 2014-08-18 MED ORDER — VERAPAMIL HCL 2.5 MG/ML IV SOLN
INTRAVENOUS | Status: AC
  Filled 2014-08-18: qty 2

## 2014-08-18 MED ORDER — ASPIRIN 81 MG PO CHEW
CHEWABLE_TABLET | ORAL | Status: AC
  Administered 2014-08-18: 81 mg via ORAL
  Filled 2014-08-18: qty 1

## 2014-08-18 MED ORDER — SODIUM CHLORIDE 0.9 % IV SOLN
INTRAVENOUS | Status: DC
  Administered 2014-08-18: 10:00:00 via INTRAVENOUS

## 2014-08-18 MED ORDER — MIDAZOLAM HCL 2 MG/2ML IJ SOLN
INTRAMUSCULAR | Status: AC
  Filled 2014-08-18: qty 2

## 2014-08-18 MED ORDER — FUROSEMIDE 40 MG PO TABS: 40.0000 mg | ORAL_TABLET | Freq: Two times a day (BID) | ORAL | Status: DC

## 2014-08-18 MED ORDER — HEPARIN (PORCINE) IN NACL 2-0.9 UNIT/ML-% IJ SOLN
INTRAMUSCULAR | Status: AC
  Filled 2014-08-18: qty 1000

## 2014-08-18 MED ORDER — LIDOCAINE HCL (PF) 1 % IJ SOLN
INTRAMUSCULAR | Status: AC
  Filled 2014-08-18: qty 30

## 2014-08-18 MED ORDER — HEPARIN SODIUM (PORCINE) 1000 UNIT/ML IJ SOLN
INTRAMUSCULAR | Status: AC
  Filled 2014-08-18: qty 1

## 2014-08-18 MED ORDER — ASPIRIN 81 MG PO CHEW
81.0000 mg | CHEWABLE_TABLET | ORAL | Status: AC
  Administered 2014-08-18: 81 mg via ORAL

## 2014-08-18 MED ORDER — SODIUM CHLORIDE 0.9 % IJ SOLN: 3.0000 mL | INTRAMUSCULAR | Status: DC | PRN

## 2014-08-18 NOTE — Progress Notes (Signed)
Rt ac sheath pulled. Manual pressure held for 28min. Rt ac site is unremarkable. Tegaderm applied with a 2x2  and coban. Pt leaves cath lab holding in stable condition. Rt radial and rt brachial sites unremarkable.

## 2014-08-18 NOTE — Discharge Instructions (Signed)
Radial Site Care °Refer to this sheet in the next few weeks. These instructions provide you with information on caring for yourself after your procedure. Your caregiver may also give you more specific instructions. Your treatment has been planned according to current medical practices, but problems sometimes occur. Call your caregiver if you have any problems or questions after your procedure. °HOME CARE INSTRUCTIONS °· You may shower the day after the procedure. Remove the bandage (dressing) and gently wash the site with plain soap and water. Gently pat the site dry. °· Do not apply powder or lotion to the site. °· Do not submerge the affected site in water for 3 to 5 days. °· Inspect the site at least twice daily. °· Do not flex or bend the affected arm for 24 hours. °· No lifting over 5 pounds (2.3 kg) for 5 days after your procedure. °· Do not drive home if you are discharged the same day of the procedure. Have someone else drive you. °· You may drive 24 hours after the procedure unless otherwise instructed by your caregiver. °· Do not operate machinery or power tools for 24 hours. °· A responsible adult should be with you for the first 24 hours after you arrive home. °What to expect: °· Any bruising will usually fade within 1 to 2 weeks. °· Blood that collects in the tissue (hematoma) may be painful to the touch. It should usually decrease in size and tenderness within 1 to 2 weeks. °SEEK IMMEDIATE MEDICAL CARE IF: °· You have unusual pain at the radial site. °· You have redness, warmth, swelling, or pain at the radial site. °· You have drainage (other than a small amount of blood on the dressing). °· You have chills. °· You have a fever or persistent symptoms for more than 72 hours. °· You have a fever and your symptoms suddenly get worse. °· Your arm becomes pale, cool, tingly, or numb. °· You have heavy bleeding from the site. Hold pressure on the site. °Document Released: 05/21/2010 Document Revised:  07/11/2011 Document Reviewed: 05/21/2010 °ExitCare® Patient Information ©2015 [employer]. This information is not intended to replace advice given to you by your health care provider. Make sure you discuss any questions you have with your health care provider. ° °

## 2014-08-18 NOTE — Interval H&P Note (Signed)
History and Physical Interval Note:  08/18/2014 10:35 AM  Marvin Chandler  has presented today for cardiac cath with the diagnosis of dyspnea, cardiomyopathy. The various methods of treatment have been discussed with the patient and family. After consideration of risks, benefits and other options for treatment, the patient has consented to  Procedure(s): LEFT AND RIGHT HEART CATHETERIZATION WITH CORONARY ANGIOGRAM (N/A) as a surgical intervention .  The patient's history has been reviewed, patient examined, no change in status, stable for surgery.  I have reviewed the patient's chart and labs.  Questions were answered to the patient's satisfaction.    Cath Lab Visit (complete for each Cath Lab visit)  Clinical Evaluation Leading to the Procedure:   ACS: No.  Non-ACS:    Anginal Classification: CCS III  Anti-ischemic medical therapy: No Therapy  Non-Invasive Test Results: No non-invasive testing performed  Prior CABG: No previous CABG        [name]

## 2014-08-18 NOTE — Progress Notes (Signed)
TR BAND REMOVAL  LOCATION:    right radial  DEFLATED PER PROTOCOL:    Yes.    TIME BAND OFF / DRESSING APPLIED:    1315   SITE UPON ARRIVAL:    Level 0  SITE AFTER BAND REMOVAL:    Level 0  CIRCULATION SENSATION AND MOVEMENT:    Within Normal Limits   Yes.    COMMENTS:   TRB REMOVED/ TEGADERM DSG APPLIED

## 2014-08-18 NOTE — CV Procedure (Signed)
      Cardiac Catheterization Operative Report  Marvin Chandler [id number] 4/18/201610:38 AM HOPPER,DAVID, MD  Procedure Performed:  1. Left Heart Catheterization 2. Selective Coronary Angiography 3. Right Heart Catheterization 4. Left ventricular angiogram  Operator: Lauree Chandler, MD  Indication:  64 yo male with dyspnea and chest pain worrisome for unstable angina. Recent admission to [facility] with CHF exacerbation, possible asthma component. Echo with mildly reduced LV systolic function with wall motion abnormalities.                                 Procedure Details: The risks, benefits, complications, treatment options, and expected outcomes were discussed with the patient. The patient and/or family concurred with the proposed plan, giving informed consent. The patient was brought to the cath lab after IV hydration was begun and oral premedication was given. The patient was further sedated with Versed and Fentanyl. The right antecubital area was prepped and draped in the usual manner. An IV catheter was present. I advanced a wire through this IV catheter and changed out for a 5 French sheath.  A balloon tipped catheter was used to perform a right heart catheterization. The right wrist was prepped and draped. Using the modified Seldinger access technique, a 5 French sheath was placed in the right radial artery. 3 mg Verapamil was given through the sheath. 5000 Units IV heparin was given. Standard diagnostic catheters were used to perform selective coronary angiography. A pigtail catheter was used to perform a left ventricular angiogram. There were no immediate complications. The patient was taken to the recovery area in stable condition.   Hemodynamic Findings: Ao:  136/72              LV: 136/3/20 RA: 8             RV: 49/3/10 PA: 53/18 (mean  32)     PCWP:  23 Fick Cardiac Output: 5.1 L/min Fick Cardiac Index: 2.3 L/min/m2 Central Aortic Saturation: 99% Pulmonary Artery  Saturation: 67%  Angiographic Findings:  Left main: No obstructive disease.   Left Anterior Descending Artery: Large caliber vessel that courses to the apex. Mild luminal irregularities noted in the mid vessel. Small caliber diagonal branch with ostial 30% stenosis.   Circumflex Artery: Large caliber vessel with large obtuse marginal branch. No obstructive disease.   Right Coronary Artery: Large dominant vessel with no obstructive disease.   Left Ventricular Angiogram: LVEF=40% with global hypokinesis with slightly more prominent hypokinesis of the inferior wall.   Impression: 1. Mild non-obstructive CAD 2. Moderate LV systolic dysfunction (Non-ischemic cardiomyopathy) 3. Slight elevation filling pressures.   Recommendations: Will increase Lasix to 40 mg po BID. Will add Cozaar 50 mg daily. Will consider adding beta blocker at the time of cath follow up.        Complications:  None; patient tolerated the procedure well.

## 2014-08-19 LAB — IGE: IgE (Immunoglobulin E), Serum: 504 kU/L — ABNORMAL HIGH (ref <115)

## 2014-08-24 ENCOUNTER — Telehealth: Payer: Self-pay | Admitting: Internal Medicine

## 2014-08-24 NOTE — Telephone Encounter (Signed)
Marvin Chandler  You are seeing him 08/29/14 - He had recent admit which was asthma and acute chf. He saw me and his FeNO was high. I did IgE and was high too. When you see him please check FeNO on him to see response. He might be a candidate for asthma trial or Xolair  Of note he had cath and shows ef45% with elevated PCWP and PAP due to congestion but high IgE suggests true asthma  Dr. Brand Males, M.D., [facility].C.P Pulmonary and Critical Care Medicine Staff Physician [facility] Pager: [phone], If no answer or between  15:00h - 7:00h: call [phone]  08/24/2014 3:09 PM

## 2014-08-29 ENCOUNTER — Encounter: Payer: Self-pay | Admitting: Adult Health

## 2014-08-29 ENCOUNTER — Ambulatory Visit (INDEPENDENT_AMBULATORY_CARE_PROVIDER_SITE_OTHER): Payer: BC Managed Care – PPO | Admitting: Adult Health

## 2014-08-29 ENCOUNTER — Ambulatory Visit (HOSPITAL_COMMUNITY)
Admission: RE | Admit: 2014-08-29 | Discharge: 2014-08-29 | Disposition: A | Discharge status: Home / Self Care | Payer: BC Managed Care – PPO | Source: Ambulatory Visit | Attending: Internal Medicine | Admitting: Internal Medicine

## 2014-08-29 VITALS — BP 124/78 | HR 90 | Ht 71.0 in | Wt 227.0 lb

## 2014-08-29 DIAGNOSIS — J4540 Moderate persistent asthma, uncomplicated: Secondary | ICD-10-CM

## 2014-08-29 DIAGNOSIS — Z8709 Personal history of other diseases of the respiratory system: Secondary | ICD-10-CM | POA: Diagnosis not present

## 2014-08-29 DIAGNOSIS — R05 Cough: Secondary | ICD-10-CM

## 2014-08-29 DIAGNOSIS — R053 Chronic cough: Secondary | ICD-10-CM

## 2014-08-29 LAB — PULMONARY FUNCTION TEST
DL/VA % PRED: 104 %
DL/VA: 4.86 ml/min/mmHg/L
DLCO cor % pred: 85 %
DLCO cor: 28.94 ml/min/mmHg
DLCO unc % pred: 82 %
DLCO unc: 27.64 ml/min/mmHg
FEF 25-75 POST: 1.2 L/s
FEF 25-75 Pre: 0.98 L/sec
FEF2575-%Change-Post: 22 %
FEF2575-%PRED-PRE: 35 %
FEF2575-%Pred-Post: 43 %
FEV1-%CHANGE-POST: 10 %
FEV1-%PRED-PRE: 67 %
FEV1-%Pred-Post: 75 %
FEV1-POST: 2.33 L
FEV1-Pre: 2.11 L
FEV1FVC-%CHANGE-POST: 7 %
FEV1FVC-%Pred-Pre: 75 %
FEV6-%Change-Post: 4 %
FEV6-%PRED-PRE: 86 %
FEV6-%Pred-Post: 90 %
FEV6-POST: 3.52 L
FEV6-PRE: 3.37 L
FEV6FVC-%Change-Post: 1 %
FEV6FVC-%PRED-PRE: 96 %
FEV6FVC-%Pred-Post: 98 %
FVC-%Change-Post: 2 %
FVC-%PRED-POST: 91 %
FVC-%Pred-Pre: 89 %
FVC-Post: 3.71 L
FVC-Pre: 3.62 L
POST FEV6/FVC RATIO: 95 %
PRE FEV1/FVC RATIO: 58 %
PRE FEV6/FVC RATIO: 93 %
Post FEV1/FVC ratio: 63 %
RV % pred: 78 %
RV: 1.9 L
TLC % pred: 80 %
TLC: 5.78 L

## 2014-08-29 LAB — NITRIC OXIDE: Nitric Oxide: 66

## 2014-08-29 MED ORDER — ALBUTEROL SULFATE (2.5 MG/3ML) 0.083% IN NEBU
2.5000 mg | INHALATION_SOLUTION | Freq: Once | RESPIRATORY_TRACT | Status: AC
  Administered 2014-08-29: 2.5 mg via RESPIRATORY_TRACT

## 2014-08-29 MED ORDER — FLUTICASONE FUROATE-VILANTEROL 200-25 MCG/INH IN AEPB: 1.0000 | INHALATION_SPRAY | Freq: Every day | RESPIRATORY_TRACT | Status: DC

## 2014-08-29 NOTE — Addendum Note (Signed)
Addended by: Parke Poisson E on: 08/29/2014 05:21 PM   Modules accepted: Orders

## 2014-08-29 NOTE — Progress Notes (Signed)
Subjective:    Patient ID: Marvin Chandler, male    DOB: 01/05/1951, 64 y.o.   MRN: [id number]  PCP HOPPER,DAVID, MD   HPI IOV 08/15/2014  Chief Complaint  Patient presents with  . Pulmonary Consult    Pt c/o of SOB easily with activity, fatigue, and some wheezing. Prod cough x2 weeks, Pt states last week was bloody mucus, this week thick green/clear mucus. Denies any chest tightness/congestion. Uses neb and inhalers daily.   64 year old well-built African-American male very limited smoker in the past. Works as a Advertising copywriter. Reports a diagnosis of asthma made 15 years ago by a pulmonologist in [place] following acute symptoms. At that point he was treated with albuterol as needed. Sometime later he moved to [place], [place]. Than 7 years ago when his son died he had a flareup of asthma and was treated in the emergency department not otherwise specified. Details unknown. Than 3 years ago he reports another exacerbation with cough wheezing shortness of breath and chest tightness. At that point in time he was prescribed Advair which she has taken it regularly probably around 3 times a week as needed. Overall he felt his quality-of-life is really good. Then some 2 months ago he started having worsening cough that he attributes her asthma but as this progressed he was admitted ed 3/15-3/22 with acute hypoxic respiratory failure i EF45% - drop from 55% in 2013 (2013 myoview normal). , CT angio chest 07/17/14 : 1. No evidence of pulmonary embolism.2. CT findings consistent with largely resolved interstitial edema.Trace amount of right pleural fluid present.Marland Kitchen BNP at admiot 500 and dropped to 134 at discharge 07/22/14. Correlating with this CXR resolved from 3?!516 to 3/252/16 with pulm edema findings. Clinically now he has a cardiac cath pending. To me the admission sounded like acute systolic heart failure exacerbation with pulmonary edema but the discharge diagnosis states acute  asthma exacerbation and possible pneumonia  Since discharge he is now 66% better. Asthma control questionnaire (ACQ) for the last 1 week shows a score of 2 which is some amount of active asthma symptoms. Specifically he says that he is not woken up hardly ever in the last 1 week because of asthma. He's having only mild symptoms due to asthma in the morning. He is only slightly limited with his activities because of asthma but he has moderate amount of shortness of breath with exertion but he has wheezed only a little of the time. For the symptoms his use albuterol 3-4 puffs every day  In addition he is reporting hemoptysis a week ago for 1 week mixed in his mucous. Currently mucus is green/white. His baseline and he does not feel sick  Denies sinus issues  Has large hiatal hernia and has gerd +  Exhaled NO today in office (FeNO) Niox is 78 ppb and vERY HIGH -suggestive of active asthma (done while on advair 250/50 and singulair)   08/29/2014 2 week : Follow up Asthma  Pt returns for follow up. Seen last visit for pulmonary consult for asthma/dypsnea Felt to have sign asthma.  BREO was increased to max dose.  He does feel better with less wheezing and dyspnea. Still has dry cough  Being evaluated by cards for Cardiomyopathy.  Recent cath with nonobstructive dz , EF 45%  Patient underwent pulmonary  function test today with a (post BD )  FEV1 at 75%, ratio 63,10% BD response FVC 91%, diffusing capacity 82% Mid flows were decreased with a significant bronchodilator  response Exhaled NO today in office (FeNO) Niox is 66 improved from last ov with decreased symptoms.  ACQ score of 16 (?last ov score ) .   Previous IgE was high ~500 .  Rare SABA use.     Review of Systems  Constitutional: Negative for fever and unexpected weight change.  HENT: Negative for congestion, ear pain, nosebleeds, postnasal drip, rhinorrhea, sinus pressure, sneezing, sore throat and trouble swallowing.   Eyes:  Negative for redness and itching.  Respiratory: Positive for cough, shortness of breath. Negative for chest tightness.   Cardiovascular: Negative for palpitations and leg swelling.  Gastrointestinal: Negative for nausea and vomiting.  Genitourinary: Negative for dysuria.  Musculoskeletal: Negative for joint swelling.  Skin: Negative for rash.  Neurological: Negative for headaches.  Hematological: Does not bruise/bleed easily.  Psychiatric/Behavioral: Negative for dysphoric mood. The patient is not nervous/anxious.        Objective:   Physical Exam  Constitutional: He is oriented to person, place, and time. He appears well-developed and well-nourished. No distress.  HENT:  Head: Normocephalic and atraumatic.  Right Ear: External ear normal.  Left Ear: External ear normal.  Mouth/Throat: Oropharynx is clear and moist. No oropharyngeal exudate.  Eyes: Conjunctivae and EOM are normal. Pupils are equal, round, and reactive to light. Right eye exhibits no discharge. Left eye exhibits no discharge. No scleral icterus.  Neck: Normal range of motion. Neck supple. No JVD present. No tracheal deviation present. No thyromegaly present.  Cardiovascular: Normal rate, regular rhythm and intact distal pulses.  Exam reveals no gallop and no friction rub.   No murmur heard. Pulmonary/Chest: Effort normal and breath sounds normal. No respiratory distress. He has no wheezes. He has no rales. He exhibits no tenderness.  Abdominal: Soft. Bowel sounds are normal. He exhibits no distension and no mass. There is no tenderness. There is no rebound and no guarding.  Musculoskeletal: Normal range of motion. He exhibits no edema or tenderness.  Lymphadenopathy:    He has no cervical adenopathy.  Neurological: He is alert and oriented to person, place, and time. He has normal reflexes. No cranial nerve deficit. Coordination normal.  Skin: Skin is warm and dry. No rash noted. He is not diaphoretic. No erythema. No  pallor.  Psychiatric: He has a normal mood and affect. His behavior is normal. Judgment and thought content normal.  Nursing note and vitals reviewed.        Assessment & Plan:

## 2014-08-29 NOTE — Patient Instructions (Signed)
Mucinex DM Twice daily  As needed  Cough/congestion  Remain on BREO  1 puff daily  Use Albuterol inhaler 2 puffs every 4hrs as needed-emergency inhaler.  Follow up Dr. Chase Caller in 4-6 weeks and As needed   Please contact office for sooner follow up if symptoms do not improve or worsen or seek emergency care

## 2014-08-29 NOTE — Assessment & Plan Note (Signed)
Severe Asthma -improved control on higher dose BREO  FENO score did decrease  Sx improved.  IgE is high , consider adding Xolair if has recurrent flare .   Plan  Mucinex DM Twice daily  As needed  Cough/congestion  Remain on BREO  1 puff daily  Use Albuterol inhaler 2 puffs every 4hrs as needed-emergency inhaler.  Follow up Dr. Chase Caller in 4-6 weeks and As needed   Please contact office for sooner follow up if symptoms do not improve or worsen or seek emergency care

## 2014-09-09 NOTE — Progress Notes (Signed)
Cardiology Office Note   Date:  09/10/2014   ID:  Marvin Chandler, DOB Aug 23, 1950, MRN [id number]  PCP:  Marvin Reason, MD  Cardiologist:  Dr. Lauree Chandler     Chief Complaint  Patient presents with  . Cardiomyopathy     History of Present Illness: Marvin Chandler is a 64 y.o. male with a hx of asthma, hiatal hernia.  He was evaluated by Dr. Lauree Chandler in 2013 for chest pain.  Myoview demonstrated no ischemia.  EF was calculated at 43%.  Echo 8/13 demonstrated normal LVF with mild LVH.    Admitted 06/2013 with acute hypoxic respiratory failure in the setting of acute asthma exacerbation and possible pneumonia.  There was some concern for vascular congestion on CXR as well.  He was treated with IV Lasix as well as a combination of BD's, prednisone and antibiotics. He had some pleuritic chest pain.  CT was neg for PE.  He did have elevated CEs with a flat trend. This was thought to be demand ischemia.  He was not seen by Cardiology.  Echo did demonstrate worsening LVF with EF 45%.  I saw him 4/13 and he noted occasional episodes of sharp chest pain and dyspnea with minimal activity. I set him up for a cardiac cath.  R/L heart cath demonstrated very mild non-obstructive CAD and mild elevated filling pressures. He was felt to have a NICM. Lasix was adjusted and he was placed on an angiotensin receptor blocker (Losartan).  He returns for FU.  He is feeling better since starting on higher dose Lasix. He is NYHA 2-2b. He denies chest pain. He denies syncope or near-syncope. He's been sleeping on 4 pillows since he left the hospital. He denies LE edema. He still has a slight cough. This is improved. Wheezing is also improved.    Studies/Reports Reviewed Today:  R/L Heart Cath 08/18/14 Hemodynamic Findings: Ao: 136/72  LV: 136/3/20 RA: 8  RV: 49/3/10 PA: 53/18 (mean 32)  PCWP: 23 Fick Cardiac Output: 5.1 L/min Fick Cardiac Index: 2.3  L/min/m2 Central Aortic Saturation: 99% Pulmonary Artery Saturation: 67% Left main: No obstructive disease.  Left Anterior Descending Artery:  Mid luminal irregularities. Small Dx ostial 30%  Circumflex Artery:   No obstructive disease.  Right Coronary Artery: Large dominant vessel with no obstructive disease.  LV Angiogram: LVEF=40% global HK with slightly more prominent HK of inferior wall.  Impression: 1. Mild non-obstructive CAD 2. Moderate LV systolic dysfunction (Non-ischemic cardiomyopathy) 3. Slight elevation filling pressures.  Recommendations: Will increase Lasix to 40 mg po BID. Will add Cozaar 50 mg daily. Will consider adding beta blocker at the time of cath follow up.     Chest CTA 07/17/14 IMPRESSION: 1. No evidence of pulmonary embolism. 2. CT findings consistent with largely resolved interstitial edema.  Echo 07/20/14 - Hypokinesis inferior and inferolateral gements. Mild LVH. EF 45%. - Left atrium: The atrium was mildly to moderately dilated. - Right ventricle: The cavity size was normal. Systolic function was normal.  Myoview 01/06/12 Probable normal perfusion and minimal soft tissue attenuation.  No ischemia.LV Ejection Fraction: 43%.  LV Wall Motion:  LVEF appears better than calculated 43%. Consider echo to evaluate.  Echo 8/13 EF 50-55%, normal WM, Gr 2 DD   Past Medical History  Diagnosis Date  . Asthma   . Cough   . Hiatal hernia   . Osteoarthritis   . Arthritis of hip     bilateral  . Personal history of colonic adenomas  02/20/2013  . Pneumonia   . NICM (nonischemic cardiomyopathy)   . Chronic systolic CHF (congestive heart failure)     a. Echo 3/16:  inf and inf-lat HK, mild LVH, EF 45%, mild to mod LAE, normal RVF  . CAD (coronary artery disease)     LHC 5/16:  Dx ostial 30%, LAD luminal irregs, EF 40%    Past Surgical History  Procedure Laterality Date  . Total hip arthroplasty  2002    bilateral  . Colonoscopy  02/13/13  . Left  and right heart catheterization with coronary angiogram N/A 08/18/2014    Procedure: LEFT AND RIGHT HEART CATHETERIZATION WITH CORONARY ANGIOGRAM;  Surgeon: [name], MD;  Location: [facility] CATH LAB;  Service: Cardiovascular;  Laterality: N/A;     Current Outpatient Prescriptions  Medication Sig Dispense Refill  . albuterol (PROVENTIL HFA) 108 (90 BASE) MCG/ACT inhaler Inhale 2 puffs into the lungs every 4 (four) hours as needed for wheezing or shortness of breath. 18 g 5  . albuterol (PROVENTIL) (2.5 MG/3ML) 0.083% nebulizer solution Take 3 mLs (2.5 mg total) by nebulization every 6 (six) hours as needed for wheezing or shortness of breath. 300 mL 5  . aspirin 81 MG tablet Take 81 mg by mouth daily.    . diclofenac (VOLTAREN) 75 MG EC tablet TAKE 1 TABLET BY MOUTH TWICE DAILY. 60 tablet 3  . doxycycline (VIBRAMYCIN) 100 MG capsule Take 1 capsule (100 mg total) by mouth 2 (two) times daily. 20 capsule 0  . Fluticasone Furoate-Vilanterol (BREO ELLIPTA) 200-25 MCG/INH AEPB Inhale 1 puff into the lungs daily. 60 each 5  . furosemide (LASIX) 40 MG tablet Take 1 tablet (40 mg total) by mouth 2 (two) times daily. 60 tablet 6  . HYDROcodone-acetaminophen (NORCO/VICODIN) 5-325 MG per tablet Take 1 tablet by mouth every 6 (six) hours as needed for moderate pain. 65 tablet 0  . losartan (COZAAR) 50 MG tablet Take 1 tablet (50 mg total) by mouth daily. 30 tablet 6  . montelukast (SINGULAIR) 10 MG tablet Take 1 tablet (10 mg total) by mouth at bedtime. 30 tablet 11  . Nebulizers (NEBULIZER COMPRESSOR) MISC Use nebulizer qid as needed (Patient taking differently: Use nebulizer four times a day as needed for shortness of breath or wheezing) 1 each 0  . nitroGLYCERIN (NITROSTAT) 0.4 MG SL tablet Place 1 tablet (0.4 mg total) under the tongue every 5 (five) minutes as needed. 25 tablet 3  . omeprazole (PRILOSEC) 40 MG capsule Take 1 capsule by mouth every morning on an empty stomach. Take 30 minutes before  eating. 30 capsule 3   No current facility-administered medications for this visit.    Allergies:   Review of patient's allergies indicates no known allergies.    Social History:  The patient  reports that he quit smoking about 16 years ago. His smoking use included Cigarettes. He has a 7 pack-year smoking history. He has never used smokeless tobacco. He reports that he does not drink alcohol or use illicit drugs.   Family History:  The patient's family history includes Cancer in his maternal aunt and another family member; Dementia in his mother; Stroke in his cousin. There is no history of Colon cancer, Esophageal cancer, or Heart attack.   ROS:   Please see the history of present illness.   Review of Systems  Constitution: Positive for diaphoresis.  Cardiovascular: Positive for paroxysmal nocturnal dyspnea.  Respiratory: Positive for cough and wheezing.   All other systems reviewed  and are negative.    PHYSICAL EXAM: VS:  BP 122/60 mmHg  Pulse 83  Ht 5\' 11"  (1.803 m)  Wt 226 lb (102.513 kg)  BMI 31.53 kg/m2    Wt Readings from Last 3 Encounters:  09/10/14 226 lb (102.513 kg)  08/29/14 227 lb (102.967 kg)  08/15/14 230 lb 9.6 oz (104.599 kg)     GEN: Well nourished, well developed, in no acute distress HEENT: normal Neck: no JVD,   no masses Cardiac:  Normal S1/S2, RRR; no murmur ,  no rubs or gallops, no edema ; right wrist without hematoma or mass  Respiratory:  Decreased breath sounds bilaterally, very faint diffuse inspiratory wheezing, no rhonchi or rales. GI: soft, nontender, nondistended, + BS MS: no deformity or atrophy Skin: warm and dry  Neuro:  CNs II-XII intact, Strength and sensation are intact Psych: Normal affect   EKG:  EKG is ordered today.  It demonstrates:   NSR, HR 83, normal axis, 1st degree AVB, PR 238 ms, PRWP, NSSTTW changes, no change from prior tracing.   Recent Labs: 12/31/2013: TSH 2.179 02/06/2014: ALT 15 07/22/2014: B Natriuretic Peptide  134.2* 08/13/2014: BUN 13; Creatinine 1.19; Hemoglobin 13.1; Platelets 263.0; Potassium 3.8; Pro B Natriuretic peptide (BNP) 262.0*; Sodium 140    Lipid Panel    Component Value Date/Time   CHOL 189 12/31/2013 1518   TRIG 209* 12/31/2013 1518   HDL 36* 12/31/2013 1518   CHOLHDL 5.3 12/31/2013 1518   VLDL 42* 12/31/2013 1518   LDLCALC 111* 12/31/2013 1518      ASSESSMENT AND PLAN:  Non-Ischemic Cardiomyopathy  Continue losartan. Check BMET today. He is still wheezing on exam. I am hesitant to place him on beta blocker at this time. I will see him back in one month and consider starting bisoprolol.  Chronic systolic CHF (congestive heart failure) Dyspnea improved. He is NYHA 2-2b. Check BMET today. Refill Lasix.  Asthma, chronic, unspecified asthma severity, uncomplicated  FU with Pulmonology as planned.   Mild Non-Obstructive CAD Continue aspirin. Start statin therapy with pravastatin 20 mg daily at bedtime. Check lipids and LFTs in 2 months.   Current medicines are reviewed at length with the patient today.  Any concerns are as outlined above.  The following changes have been made:    Start pravastatin 20 mg daily at bedtime    Labs/ tests ordered today include:  Orders Placed This Encounter  Procedures  . Basic Metabolic Panel (BMET)  . Lipid Profile  . Hepatic function panel  . EKG 12-Lead    Disposition:   FU me in one month and Dr. Angelena Form in 3 months.   Signed, Versie Starks, MHS 09/10/2014 11:44 AM    [facility] [address], [place], [place]  [address] Phone: [phone]; Fax: [phone]

## 2014-09-10 ENCOUNTER — Encounter: Payer: Self-pay | Admitting: Physician Assistant

## 2014-09-10 ENCOUNTER — Ambulatory Visit (INDEPENDENT_AMBULATORY_CARE_PROVIDER_SITE_OTHER): Payer: BC Managed Care – PPO | Admitting: Physician Assistant

## 2014-09-10 VITALS — BP 122/60 | HR 83 | Ht 71.0 in | Wt 226.0 lb

## 2014-09-10 DIAGNOSIS — I428 Other cardiomyopathies: Secondary | ICD-10-CM

## 2014-09-10 DIAGNOSIS — I429 Cardiomyopathy, unspecified: Secondary | ICD-10-CM

## 2014-09-10 DIAGNOSIS — J45909 Unspecified asthma, uncomplicated: Secondary | ICD-10-CM

## 2014-09-10 DIAGNOSIS — I5022 Chronic systolic (congestive) heart failure: Secondary | ICD-10-CM

## 2014-09-10 DIAGNOSIS — I2510 Atherosclerotic heart disease of native coronary artery without angina pectoris: Secondary | ICD-10-CM | POA: Diagnosis not present

## 2014-09-10 LAB — BASIC METABOLIC PANEL
BUN: 17 mg/dL (ref 6–23)
CO2: 34 meq/L — AB (ref 19–32)
Calcium: 9.7 mg/dL (ref 8.4–10.5)
Chloride: 104 mEq/L (ref 96–112)
Creatinine, Ser: 1.13 mg/dL (ref 0.40–1.50)
GFR: 83.56 mL/min (ref >60.00)
GLUCOSE: 126 mg/dL — AB (ref 70–99)
POTASSIUM: 3.8 meq/L (ref 3.5–5.1)
SODIUM: 142 meq/L (ref 135–145)

## 2014-09-10 MED ORDER — PRAVASTATIN SODIUM 20 MG PO TABS: 20.0000 mg | ORAL_TABLET | Freq: Every evening | ORAL | Status: DC

## 2014-09-10 MED ORDER — FUROSEMIDE 40 MG PO TABS: 40.0000 mg | ORAL_TABLET | Freq: Two times a day (BID) | ORAL | Status: DC

## 2014-09-10 NOTE — Patient Instructions (Signed)
Medication Instructions:  1. START PRAVASTATIN 20 MG 1 TAB EVERY NIGHT; RX SENT IN  Labwork: 1. TODAY BMET  2. FASTING LIPID AND LIVER PANEL TO BE DONE IN 2 MONTHS  Testing/Procedures: NONE  Follow-Up: YOU HAVE A FOLLOW UP WITH SCOTT WEAVER, PAC ON 10/13/14 @ 11:10; PLEASE ARRIVE 15 MINUTES EARLY FOR YOUR APPT  YOU HAVE A FOLLOW UP WITH DR. Angelena Form ON 12/18/14 @ 8:45; PLEASE ARRIVE 15 MINUTES EARLY FOR YOUR APPT   Any Other Special Instructions Will Be Listed Below (If Applicable).

## 2014-09-11 ENCOUNTER — Telehealth: Payer: Self-pay | Admitting: *Deleted

## 2014-09-11 NOTE — Telephone Encounter (Signed)
Pt notified of lab results by phone with verbal understanding to results given.

## 2014-10-06 ENCOUNTER — Encounter: Payer: Self-pay | Admitting: Internal Medicine

## 2014-10-06 ENCOUNTER — Ambulatory Visit (INDEPENDENT_AMBULATORY_CARE_PROVIDER_SITE_OTHER): Payer: BC Managed Care – PPO | Admitting: Internal Medicine

## 2014-10-06 VITALS — BP 156/84 | HR 73 | Ht 71.0 in | Wt 228.4 lb

## 2014-10-06 DIAGNOSIS — J4540 Moderate persistent asthma, uncomplicated: Secondary | ICD-10-CM

## 2014-10-06 DIAGNOSIS — J45901 Unspecified asthma with (acute) exacerbation: Secondary | ICD-10-CM | POA: Insufficient documentation

## 2014-10-06 DIAGNOSIS — J4541 Moderate persistent asthma with (acute) exacerbation: Secondary | ICD-10-CM | POA: Diagnosis not present

## 2014-10-06 DIAGNOSIS — I2510 Atherosclerotic heart disease of native coronary artery without angina pectoris: Secondary | ICD-10-CM

## 2014-10-06 MED ORDER — AZITHROMYCIN 250 MG PO TABS: ORAL_TABLET | ORAL | Status: DC

## 2014-10-06 MED ORDER — PREDNISONE 10 MG PO TABS: ORAL_TABLET | ORAL | Status: DC

## 2014-10-06 MED ORDER — TIOTROPIUM BROMIDE MONOHYDRATE 18 MCG IN CAPS: 18.0000 ug | ORAL_CAPSULE | Freq: Every day | RESPIRATORY_TRACT | Status: DC

## 2014-10-06 NOTE — Progress Notes (Signed)
Subjective:    Patient ID: Marvin Chandler, male    DOB: 08/18/1950, 64 y.o.   MRN: [id number]  HPI   HPI IOV 08/15/2014  Chief Complaint  Patient presents with  . Pulmonary Consult    Pt c/o of SOB easily with activity, fatigue, and some wheezing. Prod cough x2 weeks, Pt states last week was bloody mucus, this week thick green/clear mucus. Denies any chest tightness/congestion. Uses neb and inhalers daily.   64 year old well-built African-American male very limited smoker in the past. Works as a Advertising copywriter. Reports a diagnosis of asthma made 15 years ago by a pulmonologist in [place] following acute symptoms. At that point he was treated with albuterol as needed. Sometime later he moved to [place], [place]. Than 7 years ago when his son died he had a flareup of asthma and was treated in the emergency department not otherwise specified. Details unknown. Than 3 years ago he reports another exacerbation with cough wheezing shortness of breath and chest tightness. At that point in time he was prescribed Advair which she has taken it regularly probably around 3 times a week as needed. Overall he felt his quality-of-life is really good. Then some 2 months ago he started having worsening cough that he attributes her asthma but as this progressed he was admitted ed 3/15-3/22 with acute hypoxic respiratory failure i EF45% - drop from 55% in 2013 (2013 myoview normal). , CT angio chest 07/17/14 : 1. No evidence of pulmonary embolism.2. CT findings consistent with largely resolved interstitial edema.Trace amount of right pleural fluid present.Marland Kitchen BNP at admiot 500 and dropped to 134 at discharge 07/22/14. Correlating with this CXR resolved from 3?!516 to 3/252/16 with pulm edema findings. Clinically now he has a cardiac cath pending. To me the admission sounded like acute systolic heart failure exacerbation with pulmonary edema but the discharge diagnosis states acute asthma exacerbation  and possible pneumonia  Since discharge he is now 60% better. Asthma control questionnaire (ACQ) for the last 1 week shows a score of 2 which is some amount of active asthma symptoms. Specifically he says that he is not woken up hardly ever in the last 1 week because of asthma. He's having only mild symptoms due to asthma in the morning. He is only slightly limited with his activities because of asthma but he has moderate amount of shortness of breath with exertion but he has wheezed only a little of the time. For the symptoms his use albuterol 3-4 puffs every day  In addition he is reporting hemoptysis a week ago for 1 week mixed in his mucous. Currently mucus is green/white. His baseline and he does not feel sick  Denies sinus issues  has gerd +  Exhaled NO today in office (FeNO) Niox is 78 ppb and vERY HIGH -suggestive of active asthma (done while on advair 250/50 and singulair)   08/29/2014 2 week : Follow up Asthma  Pt returns for follow up. Seen last visit for pulmonary consult for asthma/dypsnea Felt to have sign asthma.  BREO was increased to max dose.  He does feel better with less wheezing and dyspnea. Still has dry cough  Being evaluated by cards for Cardiomyopathy.  Recent cath with nonobstructive dz , EF 45%  Patient underwent pulmonary  function test today with a (post BD )  FEV1 at 75%, ratio 63,10% BD response FVC 91%, diffusing capacity 82% Mid flows were decreased with a significant bronchodilator response Exhaled NO today in office (FeNO)  Niox is 66 improved from last ov with decreased symptoms.  ACQ score of 16 (?last ov score ) .   Previous IgE was high ~500 . 07/16/14, EOS 100 08/13/14 Rare SABA use.     OV 10/06/2014  Chief Complaint  Patient presents with  . Asthma    coughing up yellow phlegm, SOB, wheezing, chest pain has subsided.     Follow-up moderate persistent asthma  I last saw him mid April 2016. The time diagnosis was asthma. He followed up with  my nurse practitioner and of April 2016 asthma diagnosis was confirmed and was advised to continue his Brio. In both visits his exhaled nitric oxide continue to be high but it showed a down trend in the most recent visit in April 2016 and he was feeling fine.  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 He believes he is currently euvolemic. Old chart reviewed: CArd noes 09/10/14 indicate that he was improved but noted to be wheezing on exam and they held off starting beta blocker though they are considering bisoprolol in the future    Currently the last 1-2 weeks is reporting increased cough, chest tightness and worsening asthma despite being compliant with Brio high dose 1 puff daily and Singulair once daily at night.   Specifically in the last 1 week he says that he has woken up a few times at night because of asthma, and when he wakes up in the morning he's having mild symptoms of asthma. Is also limited to a moderate extent in his activities because of asthma. Noticing moderate amount of shortness of breath because of asthma and he is wheezed a little of the time and uses albuterol for rescue 1-2 puffs on most days. This translates into an asthma control 5. score of 2.2 which shows active asthma  Noted even in last 4 weeks he has active asthma symptoms similar to above. ACT 4 wek score is 13 - see below     Asthma Control Panel 08/15/14 08/29/14 10/06/2014   Current Med Regimen advair 250/50 + singulair  breo high dose + singulair  ACQ 5 point- 1 week. wtd avg score. <1.0 is good control 0.75-1.25 is grey zone. >1.25 poor control. Delta 0.5 is clinically meaningful   2.4   ACQ 7 point - 1 week. wtd avg score. <1.0 is good control 0.75-1.25 is grey zone. >1.25 poor control. Delta 0.5 is clinically meaningful   x  ACT - a [employer] test - 4 week. Total score. Max is 25, Lower score is worse.  <19 = poor control   13  FeNO ppB 78 66 x    FeV1   2.11 -> 2.33L/75%, R 63  (post bd), normal DLCO x  Planned intervention  for visit breo high dose  zpak + pred + add spiriva. Continue  breo and singulari  Other data IgE 500, Eos 100      reports that he quit smoking about 16 years ago. His smoking use included Cigarettes. He has a 7 pack-year smoking history. He has never used smokeless tobacco.    Current outpatient prescriptions:  .  albuterol (PROVENTIL HFA) 108 (90 BASE) MCG/ACT inhaler, Inhale 2 puffs into the lungs every 4 (four) hours as needed for wheezing or shortness of breath., Disp: 18 g, Rfl: 5 .  albuterol (PROVENTIL) (2.5 MG/3ML) 0.083% nebulizer solution, Take 3 mLs (2.5 mg total) by nebulization every 6 (six) hours as needed for  wheezing or shortness of breath., Disp: 300 mL, Rfl: 5 .  aspirin 81 MG tablet, Take 81 mg by mouth daily., Disp: , Rfl:  .  diclofenac (VOLTAREN) 75 MG EC tablet, TAKE 1 TABLET BY MOUTH TWICE DAILY., Disp: 60 tablet, Rfl: 3 .  Fluticasone Furoate-Vilanterol (BREO ELLIPTA) 200-25 MCG/INH AEPB, Inhale 1 puff into the lungs daily., Disp: 60 each, Rfl: 5 .  furosemide (LASIX) 40 MG tablet, Take 1 tablet (40 mg total) by mouth 2 (two) times daily., Disp: 180 tablet, Rfl: 3 .  HYDROcodone-acetaminophen (NORCO/VICODIN) 5-325 MG per tablet, Take 1 tablet by mouth every 6 (six) hours as needed for moderate pain., Disp: 65 tablet, Rfl: 0 .  losartan (COZAAR) 50 MG tablet, Take 1 tablet (50 mg total) by mouth daily., Disp: 30 tablet, Rfl: 6 .  montelukast (SINGULAIR) 10 MG tablet, Take 1 tablet (10 mg total) by mouth at bedtime., Disp: 30 tablet, Rfl: 11 .  Nebulizers (NEBULIZER COMPRESSOR) MISC, Use nebulizer qid as needed (Patient taking differently: Use nebulizer four times a day as needed for shortness of breath or wheezing), Disp: 1 each, Rfl: 0 .  nitroGLYCERIN (NITROSTAT) 0.4 MG SL tablet, Place 1 tablet (0.4 mg total) under the tongue every 5 (five) minutes as needed., Disp: 25 tablet, Rfl: 3 .   omeprazole (PRILOSEC) 40 MG capsule, Take 1 capsule by mouth every morning on an empty stomach. Take 30 minutes before eating., Disp: 30 capsule, Rfl: 3 .  pravastatin (PRAVACHOL) 20 MG tablet, Take 1 tablet (20 mg total) by mouth every evening., Disp: 30 tablet, Rfl: 11    Review of Systems  Constitutional: Negative for fever, chills, activity change, appetite change and unexpected weight change.  HENT: Positive for congestion. Negative for dental problem, postnasal drip, rhinorrhea, sneezing, sore throat, trouble swallowing and voice change.   Eyes: Negative for visual disturbance.  Respiratory: Positive for cough and shortness of breath. Negative for choking.   Cardiovascular: Negative for chest pain and leg swelling.  Gastrointestinal: Negative for nausea, vomiting and abdominal pain.  Genitourinary: Negative for difficulty urinating.  Musculoskeletal: Negative for arthralgias.  Skin: Negative for rash.  Psychiatric/Behavioral: Negative for behavioral problems and confusion.       Objective:   Physical Exam  Constitutional: He is oriented to person, place, and time. He appears well-developed and well-nourished. No distress.  Body mass index is 31.87 kg/(m^2).   HENT:  Head: Normocephalic and atraumatic.  Right Ear: External ear normal.  Left Ear: External ear normal.  Mouth/Throat: Oropharynx is clear and moist. No oropharyngeal exudate.  Eyes: Conjunctivae and EOM are normal. Pupils are equal, round, and reactive to light. Right eye exhibits no discharge. Left eye exhibits no discharge. No scleral icterus.  Neck: Normal range of motion. Neck supple. No JVD present. No tracheal deviation present. No thyromegaly present.  Cardiovascular: Normal rate, regular rhythm and intact distal pulses.  Exam reveals no gallop and no friction rub.   No murmur heard. Pulmonary/Chest: Effort normal and breath sounds normal. No respiratory distress. He has no wheezes. He has no rales. He exhibits  no tenderness.  occ scattered wheeze  Abdominal: Soft. Bowel sounds are normal. He exhibits no distension and no mass. There is no tenderness. There is no rebound and no guarding.  Musculoskeletal: Normal range of motion. He exhibits no edema or tenderness.  Lymphadenopathy:    He has no cervical adenopathy.  Neurological: He is alert and oriented to person, place, and time. He has normal reflexes.  No cranial nerve deficit. Coordination normal.  Skin: Skin is warm and dry. No rash noted. He is not diaphoretic. No erythema. No pallor.  Psychiatric: He has a normal mood and affect. His behavior is normal. Judgment and thought content normal.  Nursing note and vitals reviewed.   Filed Vitals:   10/06/14 1544  BP: 156/84  Pulse: 73  Height: 5\' 11"  (1.803 m)  Weight: 228 lb 6.4 oz (103.602 kg)  SpO2: 98%         Assessment & Plan:     ICD-9-CM ICD-10-CM   1. Asthma with acute exacerbation, moderate persistent 493.92 J45.41   2. Asthma, chronic, moderate persistent, uncomplicated 449.20 F00.71     I think you are in asthma flare up. - asthma is worese again He has recurrent flare up.   - Given high IgE at risk for repeated asthma exacerbation. Undiagnoised Sinus issues can be playing a role   Take Z PAK Take prednisone 40 mg daily x 2 days, then 20mg  daily x 2 days, then 10mg  daily x 2 days, then 5mg  daily x 2 days and stop Do Sinus CT without contrast - assess for recurrent flare ups  Continue  High dose BREO 1 puff daily Continue montelukast 10mg  pnce daily at night Start spiriva 1 puff daily - doing to optimize asthma contrl with least expensive/invasive way Should avoid beta blockers in bad asthma  Followup  3 months with me  Return sooner if needed   - at follwup ACQ test and FeNO - consider research (he is interested) v Xolair v allergy testing at fu   Dr. Brand Males, M.D., [facility].C.P Pulmonary and Critical Care Medicine Staff Physician [facility] Pager: [phone], If no answer or between  15:00h - 7:00h: call [phone]  10/06/2014 4:38 PM

## 2014-10-06 NOTE — Patient Instructions (Addendum)
ICD-9-CM ICD-10-CM   1. Asthma with acute exacerbation, moderate persistent 493.92 J45.41   2. Asthma, chronic, moderate persistent, uncomplicated 121.62 O46.95    I think you are in asthma flare up Take Z PAK Take prednisone 40 mg daily x 2 days, then 20mg  daily x 2 days, then 10mg  daily x 2 days, then 5mg  daily x 2 days and stop Do Sinus CT without contrast - assess for recurrent flare ups  Continue  High dose BREO 1 puff daily Continue montelukast 10mg  pnce daily at night Start spiriva 1 puff daily   Followup  3 months with me  Return sooner if needed   - at follwup ACQ test and FeNO

## 2014-10-09 ENCOUNTER — Ambulatory Visit (INDEPENDENT_AMBULATORY_CARE_PROVIDER_SITE_OTHER)
Admission: RE | Admit: 2014-10-09 | Discharge: 2014-10-09 | Disposition: A | Discharge status: Home / Self Care | Payer: BC Managed Care – PPO | Source: Ambulatory Visit | Attending: Internal Medicine | Admitting: Internal Medicine

## 2014-10-09 DIAGNOSIS — J4540 Moderate persistent asthma, uncomplicated: Secondary | ICD-10-CM | POA: Diagnosis not present

## 2014-10-09 DIAGNOSIS — J4541 Moderate persistent asthma with (acute) exacerbation: Secondary | ICD-10-CM | POA: Diagnosis not present

## 2014-10-12 NOTE — Progress Notes (Signed)
Cardiology Office Note   Date:  10/13/2014   ID:  Marvin Chandler, DOB [birth year], MRN [id number]  PCP:  Ruben Reason, MD  Cardiologist:  Dr. Lauree Chandler     Chief Complaint  Patient presents with  . Congestive Heart Failure     History of Present Illness: Marvin Chandler is a 64 y.o. male with a hx of asthma, hiatal hernia.  He was evaluated by Dr. Lauree Chandler in 2013 for chest pain.  Myoview demonstrated no ischemia.  EF was calculated at 43%.  Echo 8/13 demonstrated normal LVF with mild LVH.    Admitted 06/2013 with acute hypoxic respiratory failure in the setting of acute asthma exacerbation and possible pneumonia.  There was some concern for vascular congestion on CXR as well.  He was treated with IV Lasix as well as a combination of BD's, prednisone and antibiotics. He had some pleuritic chest pain.  CT was neg for PE.  He did have elevated CEs with a flat trend. This was thought to be demand ischemia.  He was not seen by Cardiology.  Echo did demonstrate worsening LVF with EF 45%.  I saw him 4/13 and he noted occasional episodes of sharp chest pain and dyspnea with minimal activity. I set him up for a cardiac cath.  R/L heart cath demonstrated very mild non-obstructive CAD and mild elevated filling pressures. He was felt to have a NICM. Lasix was adjusted and he was placed on an angiotensin receptor blocker (Losartan).  Last seen here by me 09/10/14. He continued to wheeze on exam and I opted to hold off on starting a beta blocker at that time. He returns for follow-up. He saw Dr. Chase Caller on 10/06/14. He was in an asthma exacerbation. Recommendation from Dr. Chase Caller was to avoid beta blockers.    He denies chest pain. Breathing is improved. He is NYHA 2-2b. He denies orthopnea, PND or edema. He denies syncope. He still has a cough. Wheezing is improved.   Studies/Reports Reviewed Today:  R/L Heart Cath 08/18/14 Hemodynamic Findings: Ao: 136/72   LV: 136/3/20 RA: 8  RV: 49/3/10 PA: 53/18 (mean 32)  PCWP: 23 Fick Cardiac Output: 5.1 L/min Fick Cardiac Index: 2.3 L/min/m2 Central Aortic Saturation: 99% Pulmonary Artery Saturation: 67% Left main: No obstructive disease.  Left Anterior Descending Artery:  Mid luminal irregularities. Small Dx ostial 30%  Circumflex Artery:   No obstructive disease.  Right Coronary Artery: Large dominant vessel with no obstructive disease.  LV Angiogram: LVEF=40% global HK with slightly more prominent HK of inferior wall.  Impression: 1. Mild non-obstructive CAD 2. Moderate LV systolic dysfunction (Non-ischemic cardiomyopathy) 3. Slight elevation filling pressures.  Recommendations: Will increase Lasix to 40 mg po BID. Will add Cozaar 50 mg daily. Will consider adding beta blocker at the time of cath follow up.     Chest CTA 07/17/14 IMPRESSION: 1. No evidence of pulmonary embolism. 2. CT findings consistent with largely resolved interstitial edema.  Echo 07/20/14 - Hypokinesis inferior and inferolateral gements. Mild LVH. EF 45%. - Left atrium: The atrium was mildly to moderately dilated. - Right ventricle: The cavity size was normal. Systolic function was normal.  Myoview 01/06/12 Probable normal perfusion and minimal soft tissue attenuation.  No ischemia.LV Ejection Fraction: 43%.  LV Wall Motion:  LVEF appears better than calculated 43%. Consider echo to evaluate.  Echo 8/13 EF 50-55%, normal WM, Gr 2 DD   Past Medical History  Diagnosis Date  . Asthma   . Cough   .  Hiatal hernia   . Osteoarthritis   . Arthritis of hip     bilateral  . Personal history of colonic adenomas 02/20/2013  . Pneumonia   . NICM (nonischemic cardiomyopathy)   . Chronic systolic CHF (congestive heart failure)     a. Echo 3/16:  inf and inf-lat HK, mild LVH, EF 45%, mild to mod LAE, normal RVF  . CAD (coronary artery disease)     LHC 5/16:  Dx ostial 30%, LAD luminal irregs, EF  40%    Past Surgical History  Procedure Laterality Date  . Total hip arthroplasty  2002    bilateral  . Colonoscopy  02/13/13  . Left and right heart catheterization with coronary angiogram N/A 08/18/2014    Procedure: LEFT AND RIGHT HEART CATHETERIZATION WITH CORONARY ANGIOGRAM;  Surgeon: [name], MD;  Location: [facility] CATH LAB;  Service: Cardiovascular;  Laterality: N/A;     Current Outpatient Prescriptions  Medication Sig Dispense Refill  . albuterol (PROVENTIL HFA) 108 (90 BASE) MCG/ACT inhaler Inhale 2 puffs into the lungs every 4 (four) hours as needed for wheezing or shortness of breath. 18 g 5  . albuterol (PROVENTIL) (2.5 MG/3ML) 0.083% nebulizer solution Take 3 mLs (2.5 mg total) by nebulization every 6 (six) hours as needed for wheezing or shortness of breath. 300 mL 5  . aspirin 81 MG tablet Take 81 mg by mouth daily.    Marland Kitchen azithromycin (ZITHROMAX) 250 MG tablet Take as directed 6 tablet 0  . diclofenac (VOLTAREN) 75 MG EC tablet TAKE 1 TABLET BY MOUTH TWICE DAILY. 60 tablet 3  . Fluticasone Furoate-Vilanterol (BREO ELLIPTA) 200-25 MCG/INH AEPB Inhale 1 puff into the lungs daily. 60 each 5  . furosemide (LASIX) 40 MG tablet Take 1 tablet (40 mg total) by mouth 2 (two) times daily. 180 tablet 3  . HYDROcodone-acetaminophen (NORCO/VICODIN) 5-325 MG per tablet Take 1 tablet by mouth every 6 (six) hours as needed for moderate pain. 65 tablet 0  . losartan (COZAAR) 50 MG tablet Take 1 tablet (50 mg total) by mouth daily. 30 tablet 6  . montelukast (SINGULAIR) 10 MG tablet Take 1 tablet (10 mg total) by mouth at bedtime. 30 tablet 11  . Nebulizers (NEBULIZER COMPRESSOR) MISC Use nebulizer qid as needed (Patient taking differently: Use nebulizer four times a day as needed for shortness of breath or wheezing) 1 each 0  . nitroGLYCERIN (NITROSTAT) 0.4 MG SL tablet Place 1 tablet (0.4 mg total) under the tongue every 5 (five) minutes as needed. 25 tablet 3  . omeprazole (PRILOSEC)  40 MG capsule Take 1 capsule by mouth every morning on an empty stomach. Take 30 minutes before eating. 30 capsule 3  . pravastatin (PRAVACHOL) 20 MG tablet Take 1 tablet (20 mg total) by mouth every evening. 30 tablet 11  . predniSONE (DELTASONE) 10 MG tablet 40 mg daily x 2 days, then 20mg  daily x 2 days, then 10mg  daily x 2 days, then 5mg  daily x 2 days and stop 15 tablet 0  . tiotropium (SPIRIVA) 18 MCG inhalation capsule Place 1 capsule (18 mcg total) into inhaler and inhale daily. 30 capsule 6   No current facility-administered medications for this visit.    Allergies:   Review of patient's allergies indicates no known allergies.    Social History:  The patient  reports that he quit smoking about 16 years ago. His smoking use included Cigarettes. He has a 7 pack-year smoking history. He has never used smokeless tobacco.  He reports that he does not drink alcohol or use illicit drugs.   Family History:  The patient's family history includes Cancer in his maternal aunt and another family member; Dementia in his mother; Diabetes in his cousin; Stroke in his cousin. There is no history of Colon cancer, Esophageal cancer, Heart attack, or Hypertension.   ROS:   Please see the history of present illness.   Review of Systems  Cardiovascular: Positive for dyspnea on exertion and orthopnea.  Respiratory: Positive for wheezing.   All other systems reviewed and are negative.    PHYSICAL EXAM: VS:  BP 110/60 mmHg  Pulse 88  Ht 5\' 11"  (1.803 m)  Wt 227 lb (102.967 kg)  BMI 31.67 kg/m2    Wt Readings from Last 3 Encounters:  10/13/14 227 lb (102.967 kg)  10/06/14 228 lb 6.4 oz (103.602 kg)  09/10/14 226 lb (102.513 kg)     GEN: Well nourished, well developed, in no acute distress HEENT: normal Neck: no JVD,   no masses Cardiac:  Normal S1/S2, RRR; no murmur ,  no rubs or gallops, no edema  Respiratory:  Decreased breath sounds bilaterally, no wheezing, no rhonchi or rales. GI: soft,  nontender, nondistended, + BS MS: no deformity or atrophy Skin: warm and dry  Neuro:  CNs II-XII intact, Strength and sensation are intact Psych: Normal affect   EKG:  EKG is not ordered today.  It demonstrates:   n/a   Recent Labs: 12/31/2013: TSH 2.179 02/06/2014: ALT 15 07/22/2014: B Natriuretic Peptide 134.2* 08/13/2014: Hemoglobin 13.1; Platelets 263.0; Pro B Natriuretic peptide (BNP) 262.0* 09/10/2014: BUN 17; Creatinine, Ser 1.13; Potassium 3.8; Sodium 142    Lipid Panel    Component Value Date/Time   CHOL 189 12/31/2013 1518   TRIG 209* 12/31/2013 1518   HDL 36* 12/31/2013 1518   CHOLHDL 5.3 12/31/2013 1518   VLDL 42* 12/31/2013 1518   LDLCALC 111* 12/31/2013 1518      ASSESSMENT AND PLAN:  Non-Ischemic Cardiomyopathy:  Continue losartan. A beta-blocker will be avoided due to his hx of significant asthma.  I considered placing him on Corlanor. However, his ejection fraction is >35%. We could certainly consider that in the future. If his asthma improves to the point where beta blockers could be initiated, I would try that first. His blood pressure would not really tolerate the addition of hydralazine and nitrates. For now, continue ARB. Consider repeat echocardiogram at some point in the next few months.  Chronic systolic CHF (congestive heart failure):  Volume stable.  He is NYHA 2-2b.  Asthma, chronic, unspecified asthma severity, uncomplicated:  FU with Pulmonology as planned.   Mild Non-Obstructive CAD:  Continue aspirin, pravastatin 20 mg daily at bedtime. Fu Lipids and LFTs pending.     Current medicines are reviewed at length with the patient today.  Any concerns are as outlined above.  The following changes have been made:    As above.    Labs/ tests ordered today include:  No orders of the defined types were placed in this encounter.    Disposition:   FU Dr. Angelena Form in August as planned.   Signed, Versie Starks, MHS 10/13/2014 11:50 AM    [facility] [address], [place], [place]  [address] Phone: [phone]; Fax: [phone]

## 2014-10-13 ENCOUNTER — Ambulatory Visit (INDEPENDENT_AMBULATORY_CARE_PROVIDER_SITE_OTHER): Payer: BC Managed Care – PPO | Admitting: Physician Assistant

## 2014-10-13 ENCOUNTER — Encounter: Payer: Self-pay | Admitting: Physician Assistant

## 2014-10-13 VITALS — BP 110/60 | HR 88 | Ht 71.0 in | Wt 227.0 lb

## 2014-10-13 DIAGNOSIS — I429 Cardiomyopathy, unspecified: Secondary | ICD-10-CM

## 2014-10-13 DIAGNOSIS — I2510 Atherosclerotic heart disease of native coronary artery without angina pectoris: Secondary | ICD-10-CM

## 2014-10-13 DIAGNOSIS — I5022 Chronic systolic (congestive) heart failure: Secondary | ICD-10-CM

## 2014-10-13 DIAGNOSIS — J45909 Unspecified asthma, uncomplicated: Secondary | ICD-10-CM

## 2014-10-13 DIAGNOSIS — I428 Other cardiomyopathies: Secondary | ICD-10-CM

## 2014-10-13 NOTE — Patient Instructions (Signed)
Medication Instructions:  Your physician recommends that you continue on your current medications as directed. Please refer to the Current Medication list given to you today.   Labwork: NONE  Testing/Procedures: NONE  Follow-Up: KEEP YOUR FOLLOW UP WITH DR. Angelena Form  Any Other Special Instructions Will Be Listed Below (If Applicable).

## 2014-10-20 ENCOUNTER — Encounter: Payer: Self-pay | Admitting: Family Medicine

## 2014-10-31 ENCOUNTER — Ambulatory Visit (INDEPENDENT_AMBULATORY_CARE_PROVIDER_SITE_OTHER): Payer: BC Managed Care – PPO | Admitting: Family Medicine

## 2014-10-31 VITALS — BP 116/74 | HR 60 | Temp 98.2°F | Resp 17 | Ht 70.0 in | Wt 225.0 lb

## 2014-10-31 DIAGNOSIS — N522 Drug-induced erectile dysfunction: Secondary | ICD-10-CM | POA: Diagnosis not present

## 2014-10-31 DIAGNOSIS — N521 Erectile dysfunction due to diseases classified elsewhere: Secondary | ICD-10-CM

## 2014-10-31 DIAGNOSIS — I2510 Atherosclerotic heart disease of native coronary artery without angina pectoris: Secondary | ICD-10-CM

## 2014-10-31 NOTE — Patient Instructions (Signed)
I will touch base with your cardiologist and get his opinion about using cialis or a similar drug. If Dr. Angelena Form feels this is ok I will rx for you Otherwise I will refer you to a urologist to discuss your other options (and there are other options!)

## 2014-10-31 NOTE — Progress Notes (Signed)
[facility] [address], [place] [address] [phone]  Date:  10/31/2014   Name:  Marvin Chandler   DOB:  12-02-1950   MRN:  [id number]  PCP:  Ruben Reason, MD    Chief Complaint: Medication Problem   History of Present Illness:  [name] is a 64 y.o. very pleasant male patient who presents with the following:  Here today with complaint of possible side effects from his medication I received the following email from his mychart account recently-  Hi Dr. Lorelei Pont,  This is your patient, Marvin Chandler, sending a message on behalf of Marvin Chandler (he asks me to take care of his MyChart account). He has been prescribed medications for heart, blood pressure, and asthma. He now has some side effects he is distressed over. Since his primary care is [facility], should he come there to coordinate his care, discuss his medications, and have a doctor contact his heart care and respiratory physicians? He liked his experience with you during a recent visit. He also needs follow-up testing for blood sugar.  Thank you.  Jesse Fall is his GF.   Chastin states that he is having trouble with his sexual function- he is not sure if this could be due to his medications.   He has noted this issue for about 9 months. He notes that he seems to have good desire but function/ quality of erections is an issue.   He will get a soft exertion alone/ in the am, and also has trouble getting a sufficient erection when interacting this his GF  He does have nitro but does not generally use it.  In fact he has never needed it He has been free of CP for some time.   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 Angelena Form.   He is most interested in as needed cialis or viagra  Patient Active Problem List   Diagnosis Date Noted  . Asthma with acute exacerbation 10/06/2014  . History of asthma 08/15/2014  .  Hemoptysis 08/15/2014  . Chronic cough 08/15/2014  . Cardiomyopathy 08/13/2014  . Asthma exacerbation 07/16/2014  . Pleuritic chest pain 07/16/2014  . Asthma, chronic 02/19/2014  . Personal history of colonic adenomas 02/20/2013  . Dyspnea 12/16/2011  . Chest pain 12/16/2011  . HIATAL HERNIA 07/11/2006  . OSTEOARTHRITIS 07/11/2006  . ARTHRITIS, HIPS, BILATERAL 07/11/2006    Past Medical History  Diagnosis Date  . Asthma   . Cough   . Hiatal hernia   . Osteoarthritis   . Arthritis of hip     bilateral  . Personal history of colonic adenomas 02/20/2013  . Pneumonia   . NICM (nonischemic cardiomyopathy)   . Chronic systolic CHF (congestive heart failure)     a. Echo 3/16:  inf and inf-lat HK, mild LVH, EF 45%, mild to mod LAE, normal RVF  . CAD (coronary artery disease)     LHC 5/16:  Dx ostial 30%, LAD luminal irregs, EF 40%    Past Surgical History  Procedure Laterality Date  . Total hip arthroplasty  2002    bilateral  . Colonoscopy  02/13/13  . Left and right heart catheterization with coronary angiogram N/A 08/18/2014    Procedure: LEFT AND RIGHT HEART CATHETERIZATION WITH CORONARY ANGIOGRAM;  Surgeon: [name], MD;  Location[facility] CATH LAB;  Service: Cardiovascular;  Laterality: N/A;  History  Substance Use Topics  . Smoking status: Former Smoker -- 1.00 packs/day for 7 years    Types: Cigarettes    Quit date: 11/05/1997  . Smokeless tobacco: Never Used  . Alcohol Use: No    Family History  Problem Relation Age of Onset  . Dementia Mother   . Colon cancer Neg Hx   . Esophageal cancer Neg Hx   . Cancer    . Heart attack Neg Hx   . Stroke Cousin   . Cancer Maternal Aunt   . Hypertension Neg Hx   . Diabetes Cousin     No Known Allergies  Medication list has been reviewed and updated.  Current Outpatient Prescriptions on File Prior to Visit  Medication Sig Dispense Refill  . albuterol (PROVENTIL HFA) 108 (90 BASE) MCG/ACT inhaler Inhale 2  puffs into the lungs every 4 (four) hours as needed for wheezing or shortness of breath. 18 g 5  . albuterol (PROVENTIL) (2.5 MG/3ML) 0.083% nebulizer solution Take 3 mLs (2.5 mg total) by nebulization every 6 (six) hours as needed for wheezing or shortness of breath. 300 mL 5  . aspirin 81 MG tablet Take 81 mg by mouth daily.    Marland Kitchen azithromycin (ZITHROMAX) 250 MG tablet Take as directed 6 tablet 0  . diclofenac (VOLTAREN) 75 MG EC tablet TAKE 1 TABLET BY MOUTH TWICE DAILY. 60 tablet 3  . Fluticasone Furoate-Vilanterol (BREO ELLIPTA) 200-25 MCG/INH AEPB Inhale 1 puff into the lungs daily. 60 each 5  . furosemide (LASIX) 40 MG tablet Take 1 tablet (40 mg total) by mouth 2 (two) times daily. 180 tablet 3  . HYDROcodone-acetaminophen (NORCO/VICODIN) 5-325 MG per tablet Take 1 tablet by mouth every 6 (six) hours as needed for moderate pain. 65 tablet 0  . losartan (COZAAR) 50 MG tablet Take 1 tablet (50 mg total) by mouth daily. 30 tablet 6  . montelukast (SINGULAIR) 10 MG tablet Take 1 tablet (10 mg total) by mouth at bedtime. 30 tablet 11  . Nebulizers (NEBULIZER COMPRESSOR) MISC Use nebulizer qid as needed (Patient taking differently: Use nebulizer four times a day as needed for shortness of breath or wheezing) 1 each 0  . nitroGLYCERIN (NITROSTAT) 0.4 MG SL tablet Place 1 tablet (0.4 mg total) under the tongue every 5 (five) minutes as needed. 25 tablet 3  . omeprazole (PRILOSEC) 40 MG capsule Take 1 capsule by mouth every morning on an empty stomach. Take 30 minutes before eating. 30 capsule 3  . pravastatin (PRAVACHOL) 20 MG tablet Take 1 tablet (20 mg total) by mouth every evening. 30 tablet 11  . predniSONE (DELTASONE) 10 MG tablet 40 mg daily x 2 days, then 20mg  daily x 2 days, then 10mg  daily x 2 days, then 5mg  daily x 2 days and stop 15 tablet 0  . tiotropium (SPIRIVA) 18 MCG inhalation capsule Place 1 capsule (18 mcg total) into inhaler and inhale daily. 30 capsule 6   No current  facility-administered medications on file prior to visit.    Review of Systems:  As per HPI- otherwise negative.   Physical Examination: Filed Vitals:   10/31/14 1627  BP: 116/74  Pulse: 60  Temp: 98.2 F (36.8 C)  Resp: 17   Filed Vitals:   10/31/14 1627  Height: 5\' 10"  (1.778 m)  Weight: 225 lb (102.059 kg)   Body mass index is 32.28 kg/(m^2). Ideal Body Weight: Weight in (lb) to have BMI = 25: 173.9  GEN: WDWN, NAD, Non-toxic,  A & O x 3, obese, looks well HEENT: Atraumatic, Normocephalic. Neck supple. No masses, No LAD. Ears and Nose: No external deformity. CV: RRR, No M/G/R. No JVD. No thrill. No extra heart sounds. PULM: CTA B, no wheezes, crackles, rhonchi. No retractions. No resp. distress. No accessory muscle use. EXTR: No c/c/e NEURO Normal gait.  PSYCH: Normally interactive. Conversant. Not depressed or anxious appearing.  Calm demeanor.    Assessment and Plan: Erectile dysfunction due to diseases classified elsewhere  History of mild CAD and CHF- here today with concern of erectile dysfunction. He would like to try a phosphodiesterase inhibitor such as viagra.  I will send a message to his cardiologist- if ok with them will rx for him.  Otherwise will refer to urology for other options   Signed Lamar Blinks, MD

## 2014-11-04 ENCOUNTER — Telehealth: Payer: Self-pay | Admitting: Family Medicine

## 2014-11-04 DIAGNOSIS — N528 Other male erectile dysfunction: Secondary | ICD-10-CM

## 2014-11-04 MED ORDER — TADALAFIL 20 MG PO TABS: 10.0000 mg | ORAL_TABLET | ORAL | Status: DC | PRN

## 2014-11-04 NOTE — Telephone Encounter (Signed)
-----   Message from Burnell Blanks, MD sent at 11/04/2014  1:12 PM EDT ----- Graylon Good, I am ok with him taking Viagra as long as he does not take it within the same 24 hours after using NTG. Hope you are doing well.   Gerald Stabs  ----- Message -----    From: Darreld Mclean, MD    Sent: 10/31/2014   9:42 PM      To: Burnell Blanks, MD  Hi Gerald Stabs- you may remember this pt, I think he has an appt to see you in August.  He saw me with complaint of erectile dysfunction and would like to try viagra or similar.  Is this ok with you from a cardiac standpoint?  He does have nitro but told me he has never used it.  If not a good idea I will refer to urology for other options. Thanks very much JC

## 2014-11-04 NOTE — Telephone Encounter (Signed)
Called pt and let him know that his cardiologist has given [place] the ok. He is not to use his medication within 24 hours of nitroglycerin.  rx cialis 20- take 1/2 or 1 as needed He will let me know if any problems Stop use if CP results   Meds ordered this encounter  Medications  . tadalafil (CIALIS) 20 MG tablet    Sig: Take 0.5-1 tablets (10-20 mg total) by mouth every other day as needed for erectile dysfunction.    Dispense:  5 tablet    Refill:  11

## 2014-11-10 ENCOUNTER — Other Ambulatory Visit (INDEPENDENT_AMBULATORY_CARE_PROVIDER_SITE_OTHER): Payer: BC Managed Care – PPO | Admitting: *Deleted

## 2014-11-10 ENCOUNTER — Telehealth: Payer: Self-pay | Admitting: *Deleted

## 2014-11-10 DIAGNOSIS — I2510 Atherosclerotic heart disease of native coronary artery without angina pectoris: Secondary | ICD-10-CM

## 2014-11-10 LAB — HEPATIC FUNCTION PANEL
ALT: 17 U/L (ref 0–53)
AST: 16 U/L (ref 0–37)
Albumin: 3.8 g/dL (ref 3.5–5.2)
Alkaline Phosphatase: 62 U/L (ref 39–117)
Bilirubin, Direct: 0.1 mg/dL (ref 0.0–0.3)
Total Bilirubin: 0.5 mg/dL (ref 0.2–1.2)
Total Protein: 6.4 g/dL (ref 6.0–8.3)

## 2014-11-10 LAB — LIPID PANEL
CHOLESTEROL: 170 mg/dL (ref 0–200)
HDL: 38.5 mg/dL — ABNORMAL LOW (ref >39.00)
LDL Cholesterol: 116 mg/dL — ABNORMAL HIGH (ref 0–99)
NonHDL: 131.5
Total CHOL/HDL Ratio: 4
Triglycerides: 76 mg/dL (ref 0.0–149.0)
VLDL: 15.2 mg/dL (ref 0.0–40.0)

## 2014-11-10 MED ORDER — PRAVASTATIN SODIUM 40 MG PO TABS: 40.0000 mg | ORAL_TABLET | Freq: Every evening | ORAL | Status: DC

## 2014-11-10 NOTE — Addendum Note (Signed)
Addended by: Eulis Foster on: 11/10/2014 07:49 AM   Modules accepted: Orders

## 2014-11-10 NOTE — Telephone Encounter (Signed)
Pt notified of lab results and to increase pravastatin to 40 mg QHS, FLP/LFT 10/31. Pt verbalized understanding to plan of care by phone ; new rx sent in for pravastatin 40 mg.

## 2014-12-01 ENCOUNTER — Ambulatory Visit: Payer: BC Managed Care – PPO | Admitting: Physician Assistant

## 2014-12-17 NOTE — Progress Notes (Signed)
Chief Complaint  Patient presents with  . Follow-up     History of Present Illness: 64 yo AAM with history of asthma, hiatal hernia but no HLD, HTN or DM and no prior cardiac disease who is here today for cardiac follow up. I saw him August 2013 as a new patient for evaluation of chest pain and an abnormal EKG. I arranged an exercise stress myoview that showed no ischemia. His LVEF was calculated at 43% but appeared better than that. Echo August 2013 with normal LVEF 50-55%, mild LVH. He was admitted March 2015 with acute hypoxic respiratory failure in the setting of acute asthma exacerbation and possible pneumonia. There was some concern for vascular congestion on CXR as well. He was treated with IV Lasix as well as a combination of BD's, prednisone and antibiotics. He had some pleuritic chest pain. CT was neg for PE. He did have elevated CEs with a flat trend. This was thought to be demand ischemia. He was not seen by Cardiology. Echo demonstrated worsening LVEF with EF 45%. He was seen by Richardson Dopp, April 2015 and he noted occasional episodes of sharp chest pain and dyspnea with minimal activity. Cardiac cath demonstrated very mild non-obstructive CAD and mild elevated filling pressures. He was felt to have a non-ischemic cardiomyopathy. Lasix was adjusted and he was placed on an angiotensin receptor blocker (Losartan).  He is feeling well. No chest pain or SOB. Occasional LE edema. He is asking for Viagra.   Primary Care Physician: Ellsworth Lennox  Last Lipid Profile:Lipid Panel     Component Value Date/Time   CHOL 170 11/10/2014 0749   TRIG 76.0 11/10/2014 0749   HDL 38.50* 11/10/2014 0749   CHOLHDL 4 11/10/2014 0749   VLDL 15.2 11/10/2014 0749   LDLCALC 116* 11/10/2014 0749     Past Medical History  Diagnosis Date  . Asthma   . Cough   . Hiatal hernia   . Osteoarthritis   . Arthritis of hip     bilateral  . Personal history of colonic adenomas 02/20/2013  .  Pneumonia   . NICM (nonischemic cardiomyopathy)   . Chronic systolic CHF (congestive heart failure)     a. Echo 3/16:  inf and inf-lat HK, mild LVH, EF 45%, mild to mod LAE, normal RVF  . CAD (coronary artery disease)     LHC 5/16:  Dx ostial 30%, LAD luminal irregs, EF 40%    Past Surgical History  Procedure Laterality Date  . Total hip arthroplasty  2002    bilateral  . Colonoscopy  02/13/13  . Left and right heart catheterization with coronary angiogram N/A 08/18/2014    Procedure: LEFT AND RIGHT HEART CATHETERIZATION WITH CORONARY ANGIOGRAM;  Surgeon: [name], MD;  Location: [facility] CATH LAB;  Service: Cardiovascular;  Laterality: N/A;    Current Outpatient Prescriptions  Medication Sig Dispense Refill  . albuterol (PROVENTIL HFA) 108 (90 BASE) MCG/ACT inhaler Inhale 2 puffs into the lungs every 4 (four) hours as needed for wheezing or shortness of breath. 18 g 5  . albuterol (PROVENTIL) (2.5 MG/3ML) 0.083% nebulizer solution Take 3 mLs (2.5 mg total) by nebulization every 6 (six) hours as needed for wheezing or shortness of breath. 300 mL 5  . aspirin 81 MG tablet Take 81 mg by mouth daily.    . diclofenac (VOLTAREN) 75 MG EC tablet TAKE 1 TABLET BY MOUTH TWICE DAILY. 60 tablet 3  . Fluticasone Furoate-Vilanterol (BREO ELLIPTA) 200-25 MCG/INH AEPB Inhale  1 puff into the lungs daily. 60 each 5  . furosemide (LASIX) 40 MG tablet Take 1 tablet (40 mg total) by mouth 2 (two) times daily. 180 tablet 3  . HYDROcodone-acetaminophen (NORCO/VICODIN) 5-325 MG per tablet Take 1 tablet by mouth every 6 (six) hours as needed for moderate pain. 65 tablet 0  . losartan (COZAAR) 50 MG tablet Take 1 tablet (50 mg total) by mouth daily. 30 tablet 6  . montelukast (SINGULAIR) 10 MG tablet Take 1 tablet (10 mg total) by mouth at bedtime. 30 tablet 11  . Nebulizers (NEBULIZER COMPRESSOR) MISC Use nebulizer qid as needed (Patient taking differently: Use nebulizer four times a day as needed for  shortness of breath or wheezing) 1 each 0  . nitroGLYCERIN (NITROSTAT) 0.4 MG SL tablet Place 1 tablet (0.4 mg total) under the tongue every 5 (five) minutes as needed. 25 tablet 3  . omeprazole (PRILOSEC) 40 MG capsule Take 1 capsule by mouth every morning on an empty stomach. Take 30 minutes before eating. 30 capsule 3  . pravastatin (PRAVACHOL) 40 MG tablet Take 1 tablet (40 mg total) by mouth every evening. 30 tablet 11  . predniSONE (DELTASONE) 10 MG tablet 40 mg daily x 2 days, then 20mg  daily x 2 days, then 10mg  daily x 2 days, then 5mg  daily x 2 days and stop 15 tablet 0  . tiotropium (SPIRIVA) 18 MCG inhalation capsule Place 1 capsule (18 mcg total) into inhaler and inhale daily. 30 capsule 6  . sildenafil (VIAGRA) 50 MG tablet Take 1 tablet (50 mg total) by mouth daily as needed for erectile dysfunction. 6 tablet 5   No current facility-administered medications for this visit.    No Known Allergies  Social History   Social History  . Marital Status: Legally Separated    Spouse Name: N/A  . Number of Children: 2  . Years of Education: N/A   Occupational History  . 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 Smoking status: Former Smoker -- 1.00 packs/day for 7 years    Types: Cigarettes    Quit date: 11/05/1997  . Smokeless tobacco: Never Used  . Alcohol Use: No  . Drug Use: No  . Sexual Activity: Not on file   Other Topics Concern  . Not on file   Social History Narrative    Family History  Problem Relation Age of Onset  . Dementia Mother   . Colon cancer Neg Hx   . Esophageal cancer Neg Hx   . Cancer    . Heart attack Neg Hx   . Stroke Cousin   . Cancer Maternal Aunt   . Hypertension Neg Hx   . Diabetes Cousin     Review of Systems:  As stated in the HPI and otherwise negative.   BP 108/72 mmHg  Pulse 61  Ht 5\' 11"  (1.803 m)  Wt 220 lb 6.4 oz (99.973 kg)  BMI 30.75 kg/m2  SpO2 98%  Physical Examination: General: Well  developed, well nourished, NAD HEENT: OP clear, mucus membranes moist SKIN: warm, dry. No rashes. Neuro: No focal deficits Musculoskeletal: Muscle strength 5/5 all ext Psychiatric: Mood and affect normal Neck: No JVD, no carotid bruits, no thyromegaly, no lymphadenopathy. Lungs:Clear bilaterally, no wheezes, rhonci, crackles Cardiovascular: Regular rate and rhythm. No murmurs, gallops or rubs. Abdomen:Soft. Bowel sounds present. Non-tender.  Extremities: No lower extremity edema. Pulses are 2 + in the bilateral DP/PT.  EKG:  EKG is  not ordered today. The ekg ordered today demonstrates   Recent Labs: 12/31/2013: TSH 2.179 07/22/2014: B Natriuretic Peptide 134.2* 08/13/2014: Hemoglobin 13.1; Platelets 263.0; Pro B Natriuretic peptide (BNP) 262.0* 09/10/2014: BUN 17; Creatinine, Ser 1.13; Potassium 3.8; Sodium 142 11/10/2014: ALT 17   Lipid Panel    Component Value Date/Time   CHOL 170 11/10/2014 0749   TRIG 76.0 11/10/2014 0749   HDL 38.50* 11/10/2014 0749   CHOLHDL 4 11/10/2014 0749   VLDL 15.2 11/10/2014 0749   LDLCALC 116* 11/10/2014 0749     Wt Readings from Last 3 Encounters:  12/18/14 220 lb 6.4 oz (99.973 kg)  10/31/14 225 lb (102.059 kg)  10/13/14 227 lb (102.967 kg)     Other studies Reviewed: Additional studies/ records that were reviewed today include: . Review of the above records demonstrates:   Assessment and Plan:   1. CAD: Mild CAD by cath April 2015. Continue ASA, statin, ARB.  2. Non-ischemic Cardiomyopathy: Continue medical therapy with ARB. No beta blocker with reactive airways disease.   3. Chronic systolic CHF: Will continue Lasix 40 mg po BID  4. Erectile dysfunction: Will start Viagra  Current medicines are reviewed at length with the patient today.  The patient does not have concerns regarding medicines.  The following changes have been made:  no change  Labs/ tests ordered today include:  No orders of the defined types were placed in this  encounter.    Disposition:   FU with m,e in 12 months  Signed, Lauree Chandler, MD 12/18/2014 9:37 AM    [address], [place], [place]  [address] Phone: [phone]; Fax: [phone]

## 2014-12-18 ENCOUNTER — Encounter: Payer: Self-pay | Admitting: Cardiovascular Disease

## 2014-12-18 ENCOUNTER — Ambulatory Visit (INDEPENDENT_AMBULATORY_CARE_PROVIDER_SITE_OTHER): Payer: BC Managed Care – PPO | Admitting: Cardiovascular Disease

## 2014-12-18 VITALS — BP 108/72 | HR 61 | Ht 71.0 in | Wt 220.4 lb

## 2014-12-18 DIAGNOSIS — I5022 Chronic systolic (congestive) heart failure: Secondary | ICD-10-CM

## 2014-12-18 DIAGNOSIS — N5201 Erectile dysfunction due to arterial insufficiency: Secondary | ICD-10-CM | POA: Diagnosis not present

## 2014-12-18 DIAGNOSIS — I2510 Atherosclerotic heart disease of native coronary artery without angina pectoris: Secondary | ICD-10-CM | POA: Diagnosis not present

## 2014-12-18 DIAGNOSIS — I429 Cardiomyopathy, unspecified: Secondary | ICD-10-CM | POA: Diagnosis not present

## 2014-12-18 DIAGNOSIS — I428 Other cardiomyopathies: Secondary | ICD-10-CM

## 2014-12-18 MED ORDER — SILDENAFIL CITRATE 50 MG PO TABS: 50.0000 mg | ORAL_TABLET | Freq: Every day | ORAL | Status: DC | PRN

## 2014-12-18 NOTE — Patient Instructions (Addendum)
Medication Instructions:  Your physician has recommended you make the following change in your medication:  Resume furosemide 40 mg by mouth twice daily. Stop Cialis.  A prescription for Viagra has been sent to your pharmacy to use as needed. Do not use within 24 hours of using nitroglycerin   Labwork: none  Testing/Procedures: none  Follow-Up: Your physician wants you to follow-up in: 12 months.  You will receive a reminder letter in the mail two months in advance. If you don't receive a letter, please call our office to schedule the follow-up appointment.

## 2015-01-21 ENCOUNTER — Encounter: Payer: Self-pay | Admitting: Internal Medicine

## 2015-01-21 ENCOUNTER — Ambulatory Visit (INDEPENDENT_AMBULATORY_CARE_PROVIDER_SITE_OTHER): Payer: BC Managed Care – PPO | Admitting: Internal Medicine

## 2015-01-21 ENCOUNTER — Other Ambulatory Visit: Payer: BC Managed Care – PPO

## 2015-01-21 VITALS — BP 120/82 | HR 69 | Ht 71.0 in | Wt 224.8 lb

## 2015-01-21 DIAGNOSIS — J4540 Moderate persistent asthma, uncomplicated: Secondary | ICD-10-CM

## 2015-01-21 DIAGNOSIS — R053 Chronic cough: Secondary | ICD-10-CM

## 2015-01-21 DIAGNOSIS — R05 Cough: Secondary | ICD-10-CM

## 2015-01-21 DIAGNOSIS — Z23 Encounter for immunization: Secondary | ICD-10-CM | POA: Diagnosis not present

## 2015-01-21 DIAGNOSIS — I2510 Atherosclerotic heart disease of native coronary artery without angina pectoris: Secondary | ICD-10-CM

## 2015-01-21 MED ORDER — FLUTICASONE PROPIONATE 50 MCG/ACT NA SUSP: 2.0000 | Freq: Every day | NASAL | Status: DC

## 2015-01-21 NOTE — Progress Notes (Signed)
Subjective:    Patient ID: Marvin Chandler, male    DOB: 1950-11-23, 64 y.o.   MRN: [id number]  HPI   IOV 08/15/2014  Chief Complaint  Patient presents with  . Pulmonary Consult    Pt c/o of SOB easily with activity, fatigue, and some wheezing. Prod cough x2 weeks, Pt states last week was bloody mucus, this week thick green/clear mucus. Denies any chest tightness/congestion. Uses neb and inhalers daily.   64 year old well-built African-American male very limited smoker in the past. Works as a Advertising copywriter. Reports a diagnosis of asthma made 15 years ago by a pulmonologist in [place] following acute symptoms. At that point he was treated with albuterol as needed. Sometime later he moved to [place], [place]. Than 7 years ago when his son died he had a flareup of asthma and was treated in the emergency department not otherwise specified. Details unknown. Than 3 years ago he reports another exacerbation with cough wheezing shortness of breath and chest tightness. At that point in time he was prescribed Advair which she has taken it regularly probably around 3 times a week as needed. Overall he felt his quality-of-life is really good. Then some 2 months ago he started having worsening cough that he attributes her asthma but as this progressed he was admitted ed 3/15-3/22 with acute hypoxic respiratory failure i EF45% - drop from 55% in 2013 (2013 myoview normal). , CT angio chest 07/17/14 : 1. No evidence of pulmonary embolism.2. CT findings consistent with largely resolved interstitial edema.Trace amount of right pleural fluid present.Marland Kitchen BNP at admiot 500 and dropped to 134 at discharge 07/22/14. Correlating with this CXR resolved from 3?!516 to 3/252/16 with pulm edema findings. Clinically now he has a cardiac cath pending. To me the admission sounded like acute systolic heart failure exacerbation with pulmonary edema but the discharge diagnosis states acute asthma exacerbation and  possible pneumonia  Since discharge he is now 60% better. Asthma control questionnaire (ACQ) for the last 1 week shows a score of 2 which is some amount of active asthma symptoms. Specifically he says that he is not woken up hardly ever in the last 1 week because of asthma. He's having only mild symptoms due to asthma in the morning. He is only slightly limited with his activities because of asthma but he has moderate amount of shortness of breath with exertion but he has wheezed only a little of the time. For the symptoms his use albuterol 3-4 puffs every day  In addition he is reporting hemoptysis a week ago for 1 week mixed in his mucous. Currently mucus is green/white. His baseline and he does not feel sick  Denies sinus issues  has gerd +  Exhaled NO today in office (FeNO) Niox is 78 ppb and vERY HIGH -suggestive of active asthma (done while on advair 250/50 and singulair)   08/29/2014 2 week : Follow up Asthma  Pt returns for follow up. Seen last visit for pulmonary consult for asthma/dypsnea Felt to have sign asthma.  BREO was increased to max dose.  He does feel better with less wheezing and dyspnea. Still has dry cough  Being evaluated by cards for Cardiomyopathy.  Recent cath with nonobstructive dz , EF 45%  Patient underwent pulmonary  function test today with a (post BD )  FEV1 at 75%, ratio 63,10% BD response FVC 91%, diffusing capacity 82% Mid flows were decreased with a significant bronchodilator response Exhaled NO today in office (FeNO) Niox  is 66 improved from last ov with decreased symptoms.  ACQ score of 16 (?last ov score ) .   Previous IgE was high ~500 . 07/16/14, EOS 100 08/13/14 Rare SABA use.     OV 66/10/2014  Chief Complaint  Patient presents with  . Asthma    coughing up yellow phlegm, SOB, wheezing, chest pain has subsided.     Follow-up moderate persistent asthma  I last saw him mid April 2016. The time diagnosis was asthma. He followed up with my  nurse practitioner and of April 2016 asthma diagnosis was confirmed and was advised to continue his Brio. In both visits his exhaled nitric oxide continue to be high but it showed a down trend in the most recent visit in April 2016 and he was feeling fine.  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 He believes he is currently euvolemic. Old chart reviewed: C[facility]d noes 09/10/14 indicate that he was improved but noted to be wheezing on exam and they held off starting beta blocker though they are considering bisoprolol in the future    Currently the last 1-2 weeks is reporting increased cough, chest tightness and worsening asthma despite being compliant with Brio high dose 1 puff daily and Singulair once daily at night.   Specifically in the last 1 week he says that he has woken up a few times at night because of asthma, and when he wakes up in the morning he's having mild symptoms of asthma. Is also limited to a moderate extent in his activities because of asthma. Noticing moderate amount of shortness of breath because of asthma and he is wheezed a little of the time and uses albuterol for rescue 1-2 puffs on most days. This translates into an asthma control 5. score of 2.2 which shows active asthma  Noted even in last 4 weeks he has active asthma symptoms similar to above. ACT 4 wek score is 13 - see below     OV 01/21/2015  Chief Complaint  Patient presents with  . Follow-up    Pt stated he still has a throat clearing cough, little mucus production - green mucus and c/o fatigue. Pt denies significant SOB and CP/tightness.     Moderate persistent poorly controlled asthma follow-up. Since last visit he continues to have asthma symptoms although his ACT 5. questionnaire score has drop to 1.8. He still is woken up a few times at night because of asthma. He does have mild symptoms of asthma when he wakes up in the morning. He has  slight limitation in his activities because of asthma and experiences a little shortness of breath because of asthma and is hardly had any wheeze in the past one week because of asthma but is uses albuterol 2 puffs during the week. He is more bothered by cough. He says the cough is chronic. It tends to improve with prednisone but recurs when he is off prednisone. Inhalers to help the cough. He is not on any nasal steroidal. Exertional NO today is significantly elevated at 57 ppb suggestive of associated significant airway inflammation despite being on Brio, Singulair and Spiriva all of which she is compliant with   Asthma Control Panel 08/15/14 08/29/14 10/06/2014  01/21/2015   Current Med Regimen advair 250/50 + singulair  breo high dose + singulair breo  + singulair + spiriva  ACQ 5 point- 1 week. wtd avg score. <1.0 is good control 0.75-1.25 is grey zone. >  1.25 poor control. Delta 0.5 is clinically meaningful   2.4  1.8  ACQ 7 point - 1 week. wtd avg score. <1.0 is good control 0.75-1.25 is grey zone. >1.25 poor control. Delta 0.5 is clinically meaningful   x x  ACT - a [employer] test - 4 week. Total score. Max is 25, Lower score is worse.  <19 = poor control   13 x  FeNO ppB 78 66 x 57  FeV1   2.11 -> 2.33L/75%, R 63  (post bd), normal DLCO x x  Planned intervention  for visit breo high dose  zpak + pred + add spiriva. Continue  breo and singulari   Other data IgE 500, Eos 100, clear cxr march 2016, clear sinus CT 10/09/14       Immunization History  Administered Date(s) Administered  . Influenza,inj,Quad PF,36+ Mos 07/17/2014  . Pneumococcal Polysaccharide-23 07/17/2014  . Tdap 12/31/2013      Current outpatient prescriptions:  .  albuterol (PROVENTIL HFA) 108 (90 BASE) MCG/ACT inhaler, Inhale 2 puffs into the lungs every 4 (four) hours as needed for wheezing or shortness of breath., Disp: 18 g, Rfl: 5 .  albuterol (PROVENTIL) (2.5 MG/3ML) 0.083% nebulizer solution, Take 3 mLs (2.5 mg total) by  nebulization every 6 (six) hours as needed for wheezing or shortness of breath., Disp: 300 mL, Rfl: 5 .  aspirin 81 MG tablet, Take 81 mg by mouth daily., Disp: , Rfl:  .  diclofenac (VOLTAREN) 75 MG EC tablet, TAKE 1 TABLET BY MOUTH TWICE DAILY., Disp: 60 tablet, Rfl: 3 .  Fluticasone Furoate-Vilanterol (BREO ELLIPTA) 200-25 MCG/INH AEPB, Inhale 1 puff into the lungs daily., Disp: 60 each, Rfl: 5 .  furosemide (LASIX) 40 MG tablet, Take 1 tablet (40 mg total) by mouth 2 (two) times daily., Disp: 180 tablet, Rfl: 3 .  HYDROcodone-acetaminophen (NORCO/VICODIN) 5-325 MG per tablet, Take 1 tablet by mouth every 6 (six) hours as needed for moderate pain., Disp: 65 tablet, Rfl: 0 .  montelukast (SINGULAIR) 10 MG tablet, Take 1 tablet (10 mg total) by mouth at bedtime., Disp: 30 tablet, Rfl: 11 .  Nebulizers (NEBULIZER COMPRESSOR) MISC, Use nebulizer qid as needed (Patient taking differently: Use nebulizer four times a day as needed for shortness of breath or wheezing), Disp: 1 each, Rfl: 0 .  nitroGLYCERIN (NITROSTAT) 0.4 MG SL tablet, Place 1 tablet (0.4 mg total) under the tongue every 5 (five) minutes as needed., Disp: 25 tablet, Rfl: 3 .  pravastatin (PRAVACHOL) 40 MG tablet, Take 1 tablet (40 mg total) by mouth every evening., Disp: 30 tablet, Rfl: 11 .  sildenafil (VIAGRA) 50 MG tablet, Take 1 tablet (50 mg total) by mouth daily as needed for erectile dysfunction., Disp: 6 tablet, Rfl: 5 .  tiotropium (SPIRIVA) 18 MCG inhalation capsule, Place 1 capsule (18 mcg total) into inhaler and inhale daily., Disp: 30 capsule, Rfl: 6 .  losartan (COZAAR) 50 MG tablet, Take 1 tablet (50 mg total) by mouth daily. (Patient not taking: Reported on 01/21/2015), Disp: 30 tablet, Rfl: 6 .  omeprazole (PRILOSEC) 40 MG capsule, Take 1 capsule by mouth every morning on an empty stomach. Take 30 minutes before eating. (Patient not taking: Reported on 01/21/2015), Disp: 30 capsule, Rfl: 3    Review of Systems    Constitutional: Negative for fever and unexpected weight change.  HENT: Negative for congestion, dental problem, ear pain, nosebleeds, postnasal drip, rhinorrhea, sinus pressure, sneezing, sore throat and trouble swallowing.   Eyes: Negative  for redness and itching.  Respiratory: Positive for cough. Negative for chest tightness, shortness of breath and wheezing.   Cardiovascular: Negative for palpitations and leg swelling.  Gastrointestinal: Negative for nausea and vomiting.  Genitourinary: Negative for dysuria.  Musculoskeletal: Negative for joint swelling.  Skin: Negative for rash.  Neurological: Negative for headaches.  Hematological: Does not bruise/bleed easily.  Psychiatric/Behavioral: Negative for dysphoric mood. The patient is not nervous/anxious.        Objective:   Physical Exam  Constitutional: He is oriented to person, place, and time. He appears well-developed and well-nourished. No distress.  HENT:  Head: Normocephalic and atraumatic.  Right Ear: External ear normal.  Left Ear: External ear normal.  Mouth/Throat: Oropharynx is clear and moist. No oropharyngeal exudate.  Eyes: Conjunctivae and EOM are normal. Pupils are equal, round, and reactive to light. Right eye exhibits no discharge. Left eye exhibits no discharge. No scleral icterus.  Neck: Normal range of motion. Neck supple. No JVD present. No tracheal deviation present. No thyromegaly present.  Cardiovascular: Normal rate, regular rhythm and intact distal pulses.  Exam reveals no gallop and no friction rub.   No murmur heard. Pulmonary/Chest: Effort normal and breath sounds normal. No respiratory distress. He has no wheezes. He has no rales. He exhibits no tenderness.  Abdominal: Soft. Bowel sounds are normal. He exhibits no distension and no mass. There is no tenderness. There is no rebound and no guarding.  Musculoskeletal: Normal range of motion. He exhibits no edema or tenderness.  Lymphadenopathy:    He has  no cervical adenopathy.  Neurological: He is alert and oriented to person, place, and time. He has normal reflexes. No cranial nerve deficit. Coordination normal.  Skin: Skin is warm and dry. No rash noted. He is not diaphoretic. No erythema. No pallor.  Psychiatric: He has a normal mood and affect. His behavior is normal. Judgment and thought content normal.  Nursing note and vitals reviewed.  Filed Vitals:   01/21/15 1120  BP: 120/82  Pulse: 69  Height: 5\' 11"  (1.803 m)  Weight: 224 lb 12.8 oz (101.969 kg)  SpO2: 98%          Assessment & Plan:  .   ICD-9-CM ICD-10-CM   1. Asthma, chronic, moderate persistent, uncomplicated 829.93 Z16.96 Allergy full profile  2. Chronic cough 786.2 R05 Allergy full profile    Cough - could be due to sinus drainage, allergy, active asthma, or irritable laruynx ASthma = still actibe but better cotnrol than last visit based on symptom score  PLAN Continue  High dose BREO 1 puff daily Continue montelukast 10mg  pnce daily at night Continue  spiriva 1 puff daily FLu shot 01/21/2015 START generic fluticasone inhaler 2 squirts each nostril daily REfer Allergist Dr Annamaria Boots - for evlaution of allergy testing v Xolair esp in setting of cardiomyopathy  - do Allergy profile blood test 01/21/2015 ahdead of that visti  Followup  6 months with me  Return sooner if needed   - at follwup ACQ test and FeNO    Dr. Brand Males, M.D., [facility].C.P Pulmonary and Critical Care Medicine Staff Physician [facility] Pager: [phone], If no answer or between  15:00h - 7:00h: call [phone]  01/21/2015 11:58 AM

## 2015-01-21 NOTE — Patient Instructions (Addendum)
    ICD-9-CM ICD-10-CM   1. Asthma, chronic, moderate persistent, uncomplicated 915.05 W97.94   2. Chronic cough 786.2 R05    Cough could be due to sinus drainage, allergy, active asthma, or irritable laruynx ASthma still actibe but better cotnrol than last visit based on symptom score  PLAN Continue  High dose BREO 1 puff daily Continue montelukast 10mg  pnce daily at night Continue  spiriva 1 puff daily FLu shot 01/21/2015 START generic fluticasone inhaler 2 squirts each nostril daily REfer Allergist Dr Annamaria Boots - for evlaution of allergy testing v Xolair esp in setting of cardiomyopathy  - do Allergy profile blood test 01/21/2015 ahdead of that visti  Followup  6 months with me  Return sooner if needed   - at follwup ACQ test and FeNO

## 2015-01-22 LAB — ALLERGY FULL PROFILE
Allergen, D pternoyssinus,d7: 0.12 kU/L — ABNORMAL HIGH
Allergen,Goose feathers, e70: <0.1 kU/L
Alternaria Alternata: <0.1 kU/L
Aspergillus fumigatus, m3: <0.1 kU/L
BAHIA GRASS: 44.6 kU/L — AB
Bermuda Grass: 24.2 kU/L — ABNORMAL HIGH
Box Elder IgE: <0.1 kU/L
CAT DANDER: 0.1 kU/L — AB
Candida Albicans: 0.58 kU/L — ABNORMAL HIGH
Common Ragweed: 0.12 kU/L — ABNORMAL HIGH
DOG DANDER: 0.24 kU/L — AB
Elm IgE: <0.1 kU/L
Fescue: >100 kU/L — ABNORMAL HIGH
G005 Rye, Perennial: >100 kU/L — ABNORMAL HIGH
Goldenrod: <0.1 kU/L
Helminthosporium halodes: <0.1 kU/L
House Dust Hollister: 3.56 kU/L — ABNORMAL HIGH
IgE (Immunoglobulin E), Serum: 591 kU/L — ABNORMAL HIGH (ref <115)
Lamb's Quarters: 0.42 kU/L — ABNORMAL HIGH
Plantain: 0.22 kU/L — ABNORMAL HIGH
Stemphylium Botryosum: <0.1 kU/L
Sycamore Tree: <0.1 kU/L
Timothy Grass: >100 kU/L — ABNORMAL HIGH

## 2015-01-22 LAB — NITRIC OXIDE: Nitric Oxide: 57

## 2015-01-23 ENCOUNTER — Telehealth: Payer: Self-pay | Admitting: Internal Medicine

## 2015-01-23 NOTE — Telephone Encounter (Signed)
Let Marvin Chandler know that blood allergy panel is POSITIVE for many things but esp different types of grass. DEFNIITELY see Dr Annamaria Boots

## 2015-01-23 NOTE — Telephone Encounter (Signed)
Called and spoke to pt. Informed pt of the recs per MR. Pt has appt to see CY in 05/2015. Pt verbalized understanding and denied any further questions or concerns at this time.

## 2015-02-03 ENCOUNTER — Other Ambulatory Visit: Payer: Self-pay | Admitting: Internal Medicine

## 2015-02-04 ENCOUNTER — Other Ambulatory Visit: Payer: Self-pay

## 2015-02-04 MED ORDER — MONTELUKAST SODIUM 10 MG PO TABS: 10.0000 mg | ORAL_TABLET | Freq: Every day | ORAL | Status: DC

## 2015-02-26 ENCOUNTER — Encounter: Payer: Self-pay | Admitting: Internal Medicine

## 2015-02-26 NOTE — Telephone Encounter (Signed)
The notice says that to request an appeal, it must be in writing and received within 180 days of the date of the letter. This is actually the 3rd or 4th notice, because every time the Nitric Oxide was used was denied.   Reason given: The request does not meet the definition of Medical Necessity found in the member's benefit booklet....  goes on to say....   Per corporate medical policy this service is considered investigational. [facility] found insufficient peer-reviewed medical literature to show a beneficial effect on health outcomes compared to established alternatives. The member's policy does not cover investigational services.   [place]  Appeals Department  Level 1  [address] [place], [place] [address]  Fax: [phone]  phone# is [phone]   A member consent form is required if a representative files the appeal (form available at [email or web address]). Letter says supporting materials can be sent with the appeal. It also says that I or my representative may ask for an external review.   MR- Please advise if you are willing to do appeal letter for NO denial, thanks!

## 2015-02-27 NOTE — Telephone Encounter (Signed)
Letter printed and faxed to below [facility] fax #.  Confirmation page received.  Forwarding to [place] to follow up on.

## 2015-02-27 NOTE — Telephone Encounter (Signed)
Triage  Give to [place] perhaps. [name] has asthma. This is not an investigational procedure. Unfortunate - pls explain to patient. I can do letter. Let patinet know, Worst case if they deny again, we have to talk to Dennison Bulla and write it off. Also in future need to find a way to avoid this situation  Letter as follows  Marvin Chandler with 1950/06/03 is patient of mine since 08/15/14. He had a long history of asthma when I saw him. Additionally in March 2016 he had been admitted for systolic heart failure. So, both entities produce wheezing and can become very difficult to differentiate. PFT in our office only showed mild obstruction Fev1 2.33L/75% but he had significant amount of symptoms as evidenced on history and asthma control questionnaire. It is well known that a significant subset of asthma patients symptoms and lung function do not correlate. It is precisely here we used exhaled nitric oxide and his firs one was significantly elevated at 78ppb showing poor control. This allowed [place] to titrate his asthma regimen. Subsequently on followup he has had lower values. The utility of this test in determining asthma control and titrating asthma medication regimen is well established. IT is wrong for your insurance company to have a policy this is experimental. I am happy to provide literature evidence to the contrary. Please remove the denial.  Sincerely yours  Dr. Brand Males, M.D., [facility].C.P Pulmonary and Critical Care Medicine Staff Physician [facility]  02/27/2015 11:30 AM

## 2015-03-02 ENCOUNTER — Other Ambulatory Visit (INDEPENDENT_AMBULATORY_CARE_PROVIDER_SITE_OTHER): Payer: BC Managed Care – PPO | Admitting: *Deleted

## 2015-03-02 DIAGNOSIS — I2510 Atherosclerotic heart disease of native coronary artery without angina pectoris: Secondary | ICD-10-CM

## 2015-03-02 LAB — HEPATIC FUNCTION PANEL
ALT: 19 U/L (ref 9–46)
AST: 24 U/L (ref 10–35)
Albumin: 4.1 g/dL (ref 3.6–5.1)
Alkaline Phosphatase: 65 U/L (ref 40–115)
BILIRUBIN DIRECT: 0.2 mg/dL (ref <=0.2)
Indirect Bilirubin: 0.4 mg/dL (ref 0.2–1.2)
TOTAL PROTEIN: 6.7 g/dL (ref 6.1–8.1)
Total Bilirubin: 0.6 mg/dL (ref 0.2–1.2)

## 2015-03-02 LAB — LIPID PANEL
CHOL/HDL RATIO: 3.2 ratio (ref <=5.0)
Cholesterol: 135 mg/dL (ref 125–200)
HDL: 42 mg/dL (ref >=40)
LDL Cholesterol: 79 mg/dL (ref <130)
TRIGLYCERIDES: 68 mg/dL (ref <150)
VLDL: 14 mg/dL (ref <30)

## 2015-03-02 NOTE — Addendum Note (Signed)
Addended by: Eulis Foster on: 03/02/2015 07:59 AM   Modules accepted: Orders

## 2015-03-02 NOTE — Addendum Note (Signed)
Addended by: Eulis Foster on: 03/02/2015 08:14 AM   Modules accepted: Orders

## 2015-03-03 ENCOUNTER — Telehealth: Payer: Self-pay | Admitting: *Deleted

## 2015-03-03 NOTE — Telephone Encounter (Signed)
Pt notified of lab results by phone with verbal understanding. Pt states he has run a fever w/cough the last couple of days. I advised pt to call his PCP. Pt agreeable to plan of care.

## 2015-03-06 ENCOUNTER — Other Ambulatory Visit: Payer: Self-pay | Admitting: Internal Medicine

## 2015-05-20 ENCOUNTER — Encounter: Payer: Self-pay | Admitting: Internal Medicine

## 2015-05-20 ENCOUNTER — Ambulatory Visit (INDEPENDENT_AMBULATORY_CARE_PROVIDER_SITE_OTHER): Payer: BC Managed Care – PPO | Admitting: Internal Medicine

## 2015-05-20 VITALS — BP 90/68 | HR 43 | Ht 71.0 in | Wt 216.8 lb

## 2015-05-20 DIAGNOSIS — J4540 Moderate persistent asthma, uncomplicated: Secondary | ICD-10-CM | POA: Diagnosis not present

## 2015-05-20 DIAGNOSIS — R05 Cough: Secondary | ICD-10-CM | POA: Diagnosis not present

## 2015-05-20 DIAGNOSIS — I429 Cardiomyopathy, unspecified: Secondary | ICD-10-CM | POA: Diagnosis not present

## 2015-05-20 DIAGNOSIS — R053 Chronic cough: Secondary | ICD-10-CM

## 2015-05-20 NOTE — Assessment & Plan Note (Signed)
Cardiomyopathy with variable CHF and possibly some ischemic exercise limitation will contribute to dyspnea on exertion. Managed by cardiology

## 2015-05-20 NOTE — Assessment & Plan Note (Signed)
Discussed possible role of angiotensin receptor blocker as an occasional cause of chronic dry cough

## 2015-05-20 NOTE — Assessment & Plan Note (Addendum)
Moderate persistent uncomplicated We reviewed his use of medications and day-to-day variability including recognize triggers. He does expect to be worse at times and he expects a significant environmental allergy component. He has needed prednisone and emergency visit for his lung disease in the past year despite compliant use of bronchodilator medications and maintenance controller. He would be an appropriate candidate for Xolair. I began a preliminary discussion of this medication including potential complications and limitations. He is interested in learning more. Plan-begin the Xolair application process

## 2015-05-20 NOTE — Patient Instructions (Signed)
We discussed the injection therapy Xolair as possibly a very good treatment for the allergy part of your asthma and breathing problems. We are going to help you start applying for this, so that we can help you find out what it would cost.   I will be having my nurse call you to help get this process started.

## 2015-05-20 NOTE — Progress Notes (Signed)
05/20/2015-65 year old male former smoker Referred by Dr Chase Caller, recent allergy profile. PFT 2016, question of allergy evaluation versus Xolair. Being treated for moderate persistent asthma complicated by nonischemic cardiomyopathy/EF 45%,, hiatal hernia, chronic cough, dyspnea Allergy Profile 01/21/2015-total IgE 591 with elevations for dust mite, cat, dog, grass and weed pollens CT sinuses within normal limits 10/09/2014 CXR 07/25/2014-within normal limits, with resolution of previous CHF PFT 08/29/2014 had shown moderate obstructive airways disease with insignificant response to bronchodilator, DLCO 82%. He describes nasal congestion sneezing, chest tightness and wheezing especially spring until early summer, and then fall. 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 Chronic cough. We discussed possible connection to his ARB losartan. Last prednisone about a month ago. Last emergency room and admission last year for respiratory problems. Has been using home meds: Spiriva, Breo Ellipta 200, Flonase, nebulizer with albuterol or albuterol rescue inhaler  Prior to Admission medications   Medication Sig Start Date End Date Taking? Authorizing Provider  albuterol (PROVENTIL HFA) 108 (90 BASE) MCG/ACT inhaler Inhale 2 puffs into the lungs every 4 (four) hours as needed for wheezing or shortness of breath. 07/22/14  Yes Theodis Blaze, MD  albuterol (PROVENTIL) (2.5 MG/3ML) 0.083% nebulizer solution Take 3 mLs (2.5 mg total) by nebulization every 6 (six) hours as needed for wheezing or shortness of breath. 07/22/14  Yes Theodis Blaze, MD  aspirin 81 MG tablet Take 81 mg by mouth daily.   Yes Historical Provider, MD  diclofenac (VOLTAREN) 75 MG EC tablet TAKE 1 TABLET BY MOUTH TWICE DAILY. 12/31/13  Yes Barton Fanny, MD  fluticasone [facility]) 50 MCG/ACT nasal spray Place 2 sprays into both nostrils daily. 01/21/15  Yes Brand Males, MD  Fluticasone  Furoate-Vilanterol (BREO ELLIPTA) 200-25 MCG/INH AEPB Inhale 1 puff into the lungs daily. 08/15/14  Yes Brand Males, MD  furosemide (LASIX) 40 MG tablet Take 1 tablet (40 mg total) by mouth 2 (two) times daily. 09/10/14  Yes Liliane Shi, PA-C  HYDROcodone-acetaminophen (NORCO/VICODIN) 5-325 MG per tablet Take 1 tablet by mouth every 6 (six) hours as needed for moderate pain. 07/22/14  Yes Theodis Blaze, MD  losartan (COZAAR) 50 MG tablet Take 1 tablet (50 mg total) by mouth daily. 08/18/14  Yes Rhonda G Barrett, PA-C  montelukast (SINGULAIR) 10 MG tablet Take 1 tablet (10 mg total) by mouth at bedtime. 03/09/15  Yes Brand Males, MD  Nebulizers (NEBULIZER COMPRESSOR) MISC Use nebulizer qid as needed Patient taking differently: Use nebulizer four times a day as needed for shortness of breath or wheezing 11/26/13  Yes Roselee Culver, MD  nitroGLYCERIN (NITROSTAT) 0.4 MG SL tablet Place 1 tablet (0.4 mg total) under the tongue every 5 (five) minutes as needed. 08/13/14  Yes Scott Joylene Draft, PA-C  omeprazole (PRILOSEC) 40 MG capsule Take 1 capsule by mouth every morning on an empty stomach. Take 30 minutes before eating. 12/31/13  Yes Barton Fanny, MD  pravastatin (PRAVACHOL) 40 MG tablet Take 1 tablet (40 mg total) by mouth every evening. 11/10/14  Yes Liliane Shi, PA-C  sildenafil (VIAGRA) 50 MG tablet Take 1 tablet (50 mg total) by mouth daily as needed for erectile dysfunction. 12/18/14  Yes Burnell Blanks, MD  tiotropium (SPIRIVA) 18 MCG inhalation capsule Place 1 capsule (18 mcg total) into inhaler and inhale daily. 10/06/14  Yes Brand Males, MD   Past Medical History  Diagnosis Date  . Asthma   . Cough   .  Hiatal hernia   . Osteoarthritis   . Arthritis of hip     bilateral  . Personal history of colonic adenomas 02/20/2013  . Pneumonia   . NICM (nonischemic cardiomyopathy) ([place])   . Chronic systolic CHF (congestive heart failure) ([place])     a. Echo 3/16:  inf and  inf-lat HK, mild LVH, EF 45%, mild to mod LAE, normal RVF  . CAD (coronary artery disease)     LHC 5/16:  Dx ostial 30%, LAD luminal irregs, EF 40%   Past Surgical History  Procedure Laterality Date  . Total hip arthroplasty  2002    bilateral  . Colonoscopy  02/13/13  . Left and right heart catheterization with coronary angiogram N/A 08/18/2014    Procedure: LEFT AND RIGHT HEART CATHETERIZATION WITH CORONARY ANGIOGRAM;  Surgeon: [name], MD;  Location: [facility] CATH LAB;  Service: Cardiovascular;  Laterality: N/A;   Family History  Problem Relation Age of Onset  . Dementia Mother   . Colon cancer Neg Hx   . Esophageal cancer Neg Hx   . Cancer    . Heart attack Neg Hx   . Stroke Cousin   . Cancer Maternal Aunt   . Hypertension Neg Hx   . Diabetes Cousin    Social History   Social History  . Marital Status: Legally Separated    Spouse Name: N/A  . Number of Children: 2  . Years of Education: N/A   Occupational History  . 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 Smoking status: Former Smoker -- 1.00 packs/day for 7 years    Types: Cigarettes    Quit date: 11/05/1997  . Smokeless tobacco: Never Used  . Alcohol Use: No  . Drug Use: No  . Sexual Activity: Not on file   Other Topics Concern  . Not on file   Social History Narrative   ROS-see HPI   Negative unless "+" Constitutional:    weight loss, night sweats, fevers, chills, fatigue, lassitude. HEENT:    headaches, difficulty swallowing, tooth/dental problems, sore throat,       sneezing, itching, ear ache, nasal congestion, post nasal drip, snoring CV:    chest pain, orthopnea, PND, swelling in lower extremities, anasarca,                                                   dizziness, palpitations Resp:   shortness of breath with exertion or at rest.                productive cough,  + non-productive cough, coughing up of blood.              change in color of mucus.  wheezing.    Skin:    rash or lesions. GI:  No-   heartburn, indigestion, abdominal pain, nausea, vomiting, diarrhea,                 change in bowel habits, loss of appetite GU: dysuria, change in color of urine, no urgency or frequency.   flank pain. MS:   joint pain, stiffness, decreased range of motion, back pain. Neuro-     nothing unusual Psych:  change in mood or affect.  depression or anxiety.   memory loss.  OBJ- Physical Exam General- Alert, Oriented, Affect-appropriate,  Distress- none acute Skin- rash-none, lesions- none, excoriation- none Lymphadenopathy- none Head- atraumatic            Eyes- Gross vision intact, PERRLA, conjunctivae and secretions clear            Ears- Hearing, canals-normal            Nose- Clear, no-Septal dev, mucus, polyps, erosion, perforation             Throat- Mallampati III , mucosa clear , drainage- none, tonsils- atrophic Neck- flexible , trachea midline, no stridor , thyroid nl, carotid no bruit Chest - symmetrical excursion , unlabored           Heart/CV- RRR , no murmur , no gallop  , no rub, nl s1 s2                           - JVD- none , edema- none, stasis changes- none, varices- none           Lung- clear to P&A/diminished, wheeze- none, cough + dry , dullness-none, rub- none           Chest wall-  Abd-  Br/ Gen/ Rectal- Not done, not indicated Extrem- cyanosis- none, clubbing, none, atrophy- none, strength- nl Neuro- grossly intact to observation

## 2015-06-03 ENCOUNTER — Other Ambulatory Visit: Payer: Self-pay | Admitting: Physician Assistant

## 2015-06-18 ENCOUNTER — Telehealth: Payer: Self-pay | Admitting: Internal Medicine

## 2015-06-18 NOTE — Telephone Encounter (Signed)
Spoke with Debbie at [employer], states that page 2 out of 3 on pt's PA for Xolair was missing- needs this refaxed.    Katie please advise if you still have these forms, and if you do please have these refaxed.  Thanks!

## 2015-06-25 NOTE — Telephone Encounter (Signed)
Katie please advise. Thanks. 

## 2015-06-25 NOTE — Telephone Encounter (Addendum)
Spoke with Debbie at [employer] 2 and 3 were never received so therefore were never faxed to them. Jackelyn Poling is re-opening case for PA on patient and faxing whole PA package to me to complete and fax back.

## 2015-06-25 NOTE — Telephone Encounter (Signed)
Forms have been completed and faxed back to [facility]; will await a decision on PA.

## 2015-06-26 NOTE — Telephone Encounter (Signed)
PA has been approved from 06/25/15 through 06/24/2016. I will contact patient today to inform him of approval and set up Xolair start process.

## 2015-06-30 NOTE — Telephone Encounter (Signed)
Marvin Chandler has this been done?  Thanks!

## 2015-07-02 NOTE — Telephone Encounter (Signed)
Marvin Chandler has this been completed?

## 2015-07-02 NOTE — Telephone Encounter (Signed)
Spoke with patient-aware of PA approval and start up process; Pt requested I go over the details with his fiance Marvin Chandler as well. Both stated they understood and aware that [facility] will contact them to set up acct and then contact our office for shipment date. Pt is aware that I will contact him once Xolair is in office to set up appt date/time and to ensure Epipen rx has been sent to [facility] and [place].

## 2015-07-14 ENCOUNTER — Other Ambulatory Visit: Payer: Self-pay | Admitting: Internal Medicine

## 2015-07-15 IMAGING — CT CT PARANASAL SINUSES LIMITED
1 of 2 series · 16 of 19 positions shown, 20 images · non-contrast
Comparison: None.

CLINICAL DATA: 63-year-old male with slight productive cough.
Asthma exacerbation for 1 year. Initial encounter.

EXAM:
CT PARANASAL SINUS LIMITED WITHOUT CONTRAST
TECHNIQUE: Non-contiguous multidetector CT images of the paranasal sinuses were
obtained in a single plane without contrast.

[Series 4: ltd sinus 3.0 h30s · axial · 0.32mm/px · z∈[-89,+6]mm · 16 of 18 slices shown, 20 images]
[im 2/18  brain]
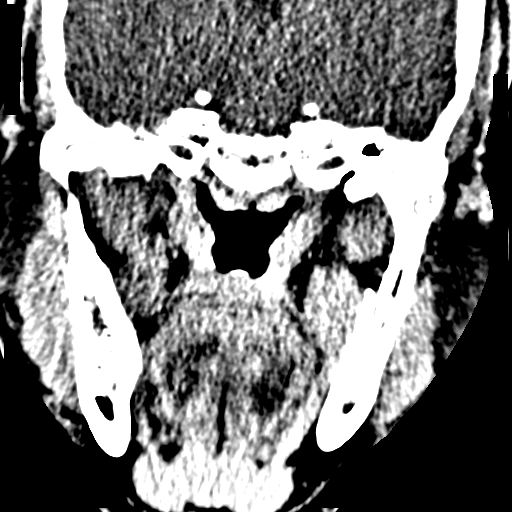
[im 2/18  bone]
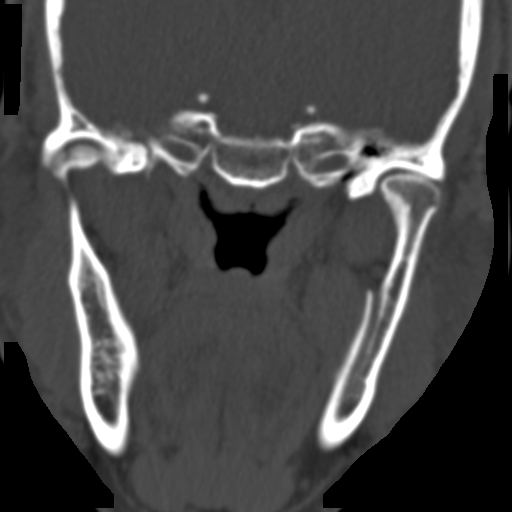
[im 3/18  bone]
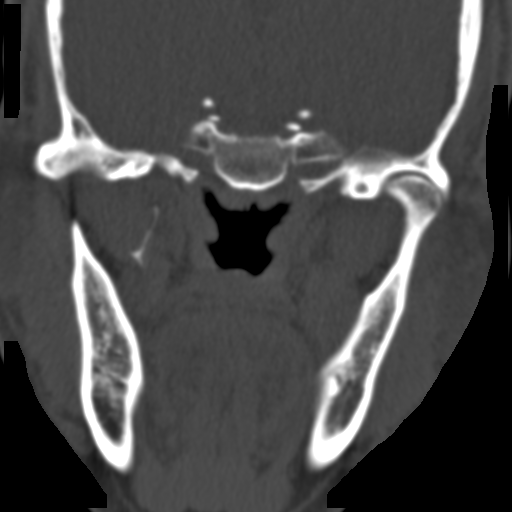
[im 4/18  bone]
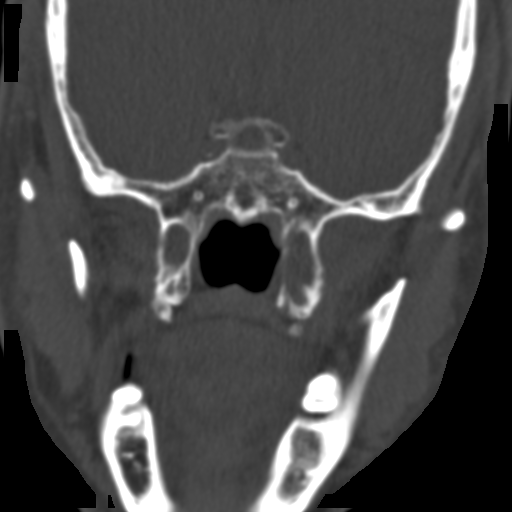
[im 5/18  bone]
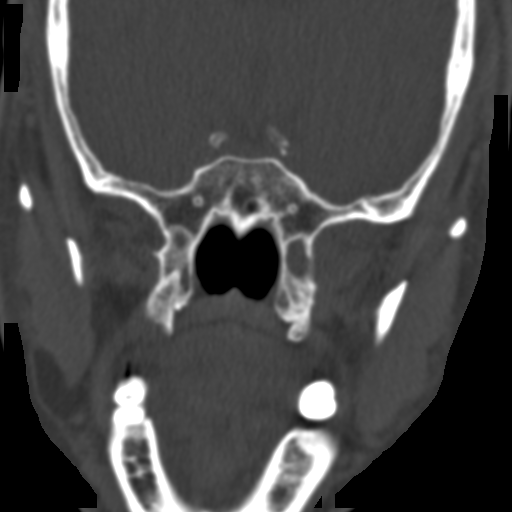
[im 6/18  brain]
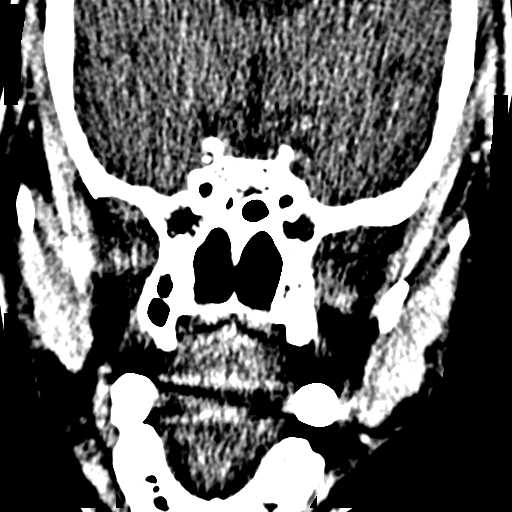
[im 6/18  bone]
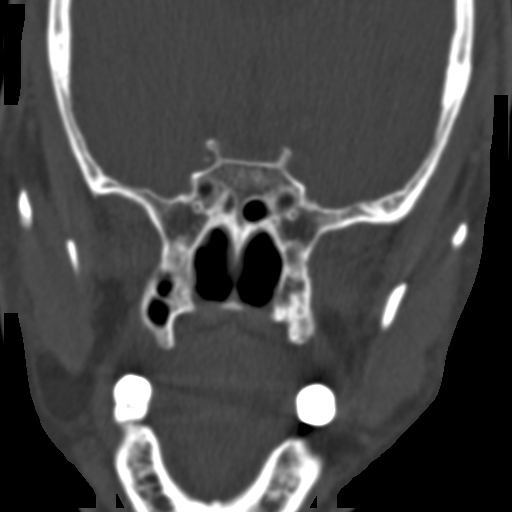
[im 7/18  bone]
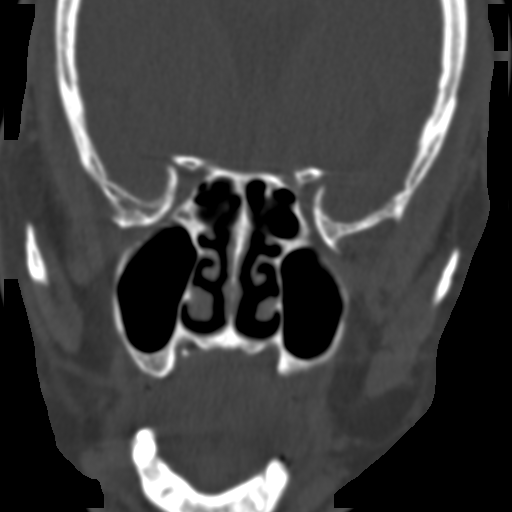
[im 8/18  bone]
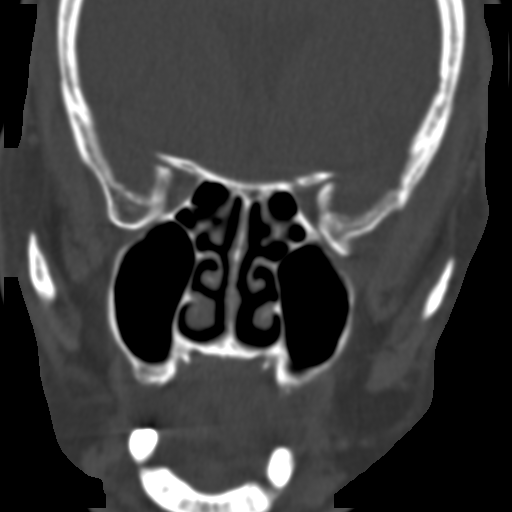
[im 9/18  bone]
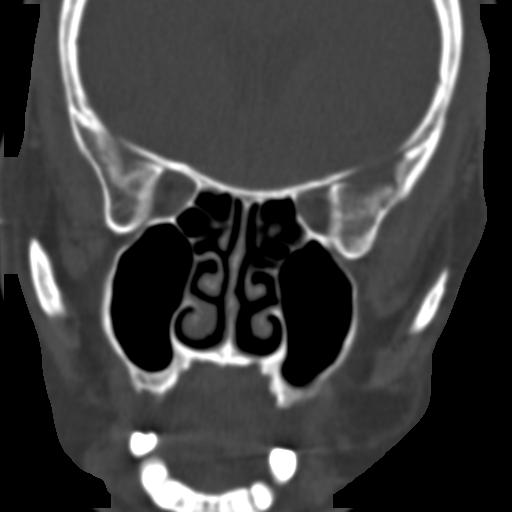
[im 10/18  brain]
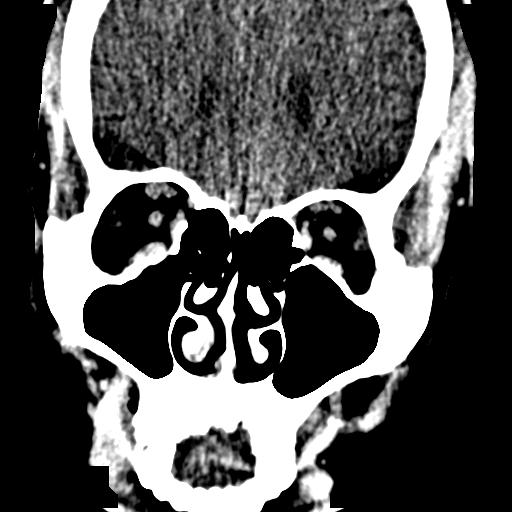
[im 10/18  bone]
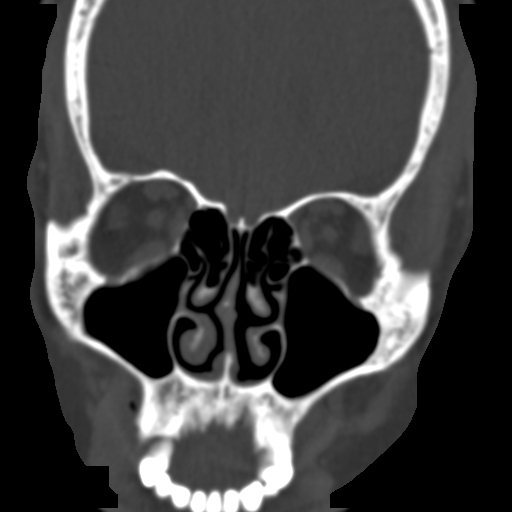
[im 11/18  bone]
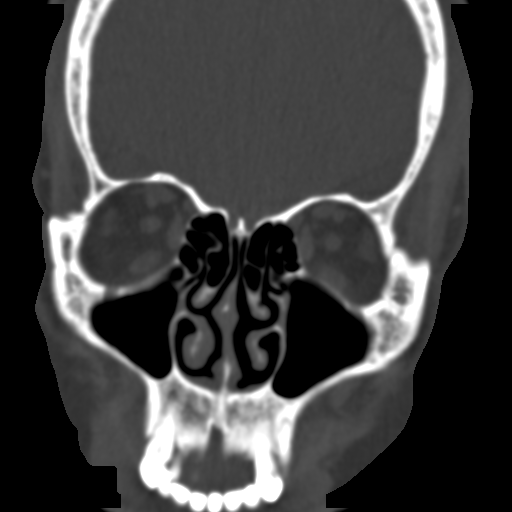
[im 12/18  bone]
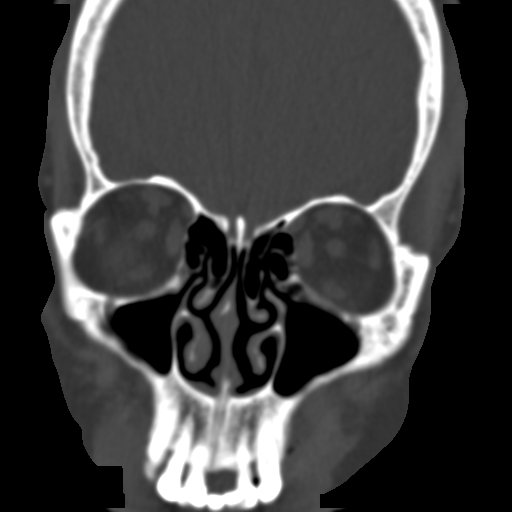
[im 13/18  bone]
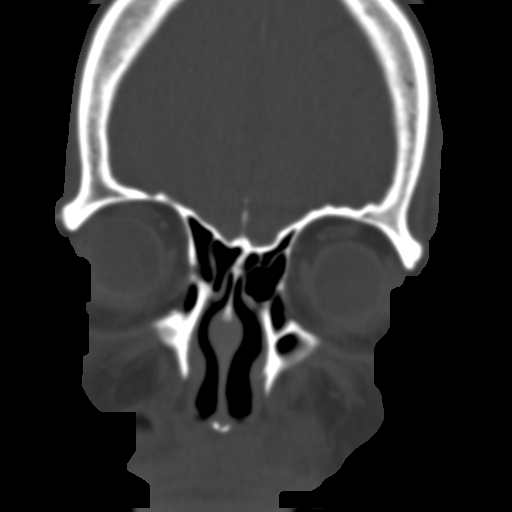
[im 14/18  brain]
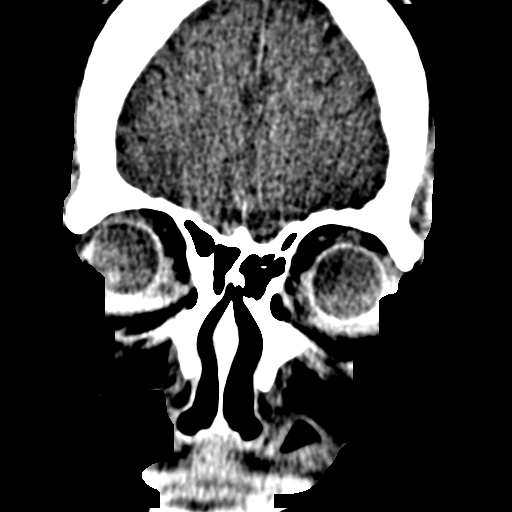
[im 14/18  bone]
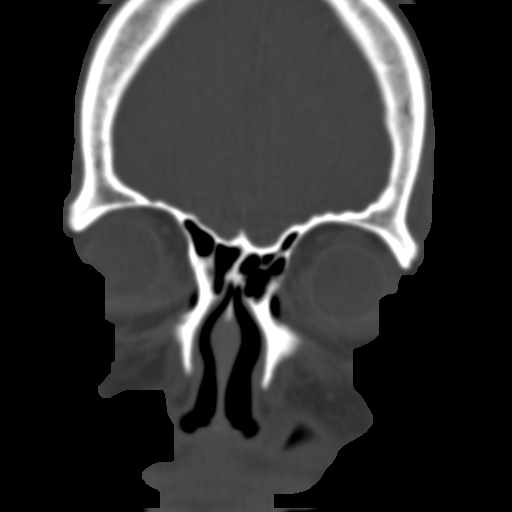
[im 15/18  bone]
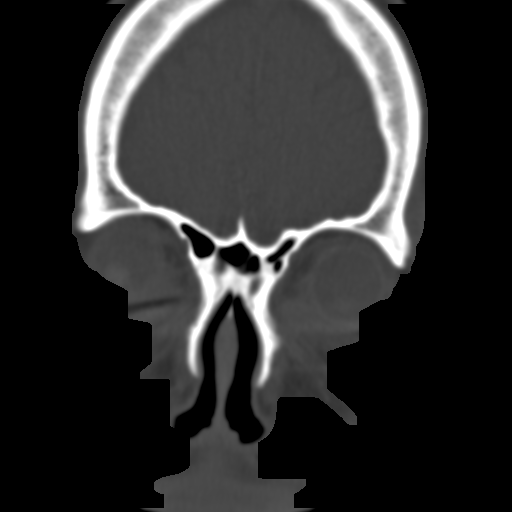
[im 16/18  bone]
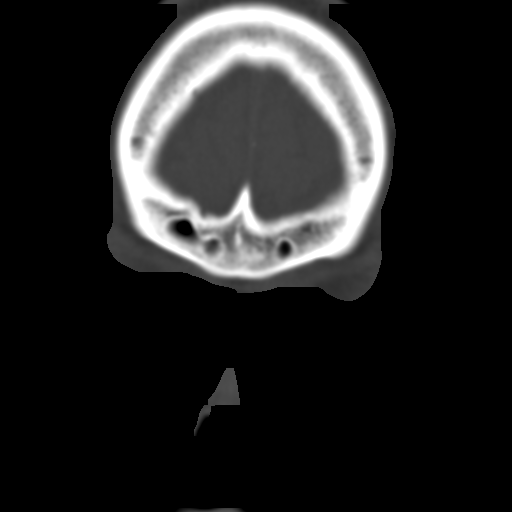
[im 17/18  bone]
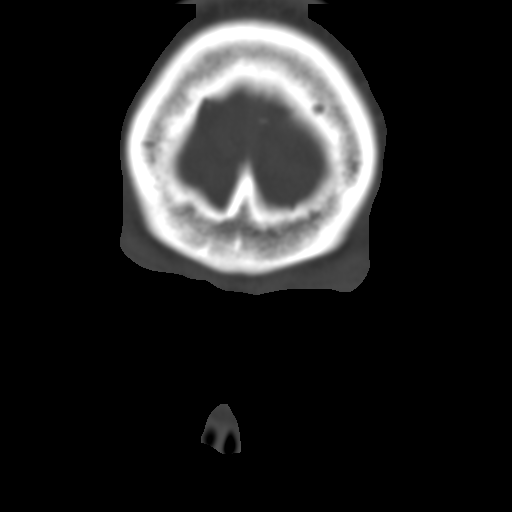

[16 of 19 positions shown; findings below may reference images not displayed]

FINDINGS: Visualized aerated paranasal sinuses are clear. The frontal sinuses
and sphenoid sinus air cells appear diminutive in size.

Partial aeration of the middle turbinate bilaterally.

Minimal curvature nasal septum to the right.

Visualized intracranial structures unremarkable.
IMPRESSION: Visualized aerated paranasal sinuses are clear.

## 2015-07-22 ENCOUNTER — Ambulatory Visit: Payer: BC Managed Care – PPO | Admitting: Internal Medicine

## 2015-07-27 ENCOUNTER — Ambulatory Visit: Payer: BC Managed Care – PPO | Admitting: Internal Medicine

## 2015-07-28 ENCOUNTER — Encounter: Payer: Self-pay | Admitting: Adult Health

## 2015-07-28 ENCOUNTER — Ambulatory Visit (INDEPENDENT_AMBULATORY_CARE_PROVIDER_SITE_OTHER): Payer: BC Managed Care – PPO | Admitting: Adult Health

## 2015-07-28 VITALS — BP 98/66 | HR 74 | Temp 98.3°F | Ht 71.0 in | Wt 212.0 lb

## 2015-07-28 DIAGNOSIS — J4541 Moderate persistent asthma with (acute) exacerbation: Secondary | ICD-10-CM | POA: Diagnosis not present

## 2015-07-28 MED ORDER — AZITHROMYCIN 250 MG PO TABS
ORAL_TABLET | ORAL | Status: AC
Start: 2015-07-28 — End: 2015-08-02

## 2015-07-28 MED ORDER — PREDNISONE 10 MG PO TABS: ORAL_TABLET | ORAL | Status: DC

## 2015-07-28 NOTE — Assessment & Plan Note (Signed)
Flare with bronchitis   Plan  Zpack take directed .  Mucinex DM Twice daily  As needed  Cough/congestion  Zyrtec 10mg  At bedtime  As needed  Drainage  Fluids and rest  Prednisone taper over next week.  Please contact office for sooner follow up if symptoms do not improve or worsen or seek emergency care  Follow up Dr. Chase Caller in 3 months and As needed

## 2015-07-28 NOTE — Progress Notes (Signed)
Subjective:    Patient ID: Marvin Chandler, male    DOB: [birth year], 65 y.o.   MRN: [id number]  HPI 65 yo male former smoker with Asthma   TEST  PFT 08/2014  (post BD )  FEV1 at 75%, ratio 63,10% BD response FVC 91%, diffusing capacity 82% Mid flows were decreased with a significant bronchodilator response FENTO 08/2014  64  IgE >500, ++RAST  Has nonischemic CM with EF 45%.   07/28/2015 Acute OV : Asthma  Pt presents for an acute office visit.  Complains of 1 week of  prod cough with yellow mucus, chest congestion/discomfort, wheezing and SOB x 1 week. Fever late last week. Denies any nausea or vomiting.  Family is sick with similar symptoms.  Appetite is god. No increased leg sweling, no orthopnea, chest pain.  Remains on Singulair , Spiriva , BREO .  Taking cold meds otc.    Past Medical History  Diagnosis Date  . Asthma   . Cough   . Hiatal hernia   . Osteoarthritis   . Arthritis of hip     bilateral  . Personal history of colonic adenomas 02/20/2013  . Pneumonia   . NICM (nonischemic cardiomyopathy) ([place])   . Chronic systolic CHF (congestive heart failure) ([place])     a. Echo 3/16:  inf and inf-lat HK, mild LVH, EF 45%, mild to mod LAE, normal RVF  . CAD (coronary artery disease)     LHC 5/16:  Dx ostial 30%, LAD luminal irregs, EF 40%   Current Outpatient Prescriptions on File Prior to Visit  Medication Sig Dispense Refill  . albuterol (PROVENTIL HFA) 108 (90 BASE) MCG/ACT inhaler Inhale 2 puffs into the lungs every 4 (four) hours as needed for wheezing or shortness of breath. 18 g 5  . albuterol (PROVENTIL) (2.5 MG/3ML) 0.083% nebulizer solution Take 3 mLs (2.5 mg total) by nebulization every 6 (six) hours as needed for wheezing or shortness of breath. 300 mL 5  . aspirin 81 MG tablet Take 81 mg by mouth daily.    Marland Kitchen BREO ELLIPTA 200-25 MCG/INH AEPB INHALE 1 PUFF BY MOUTH DAILY 60 each 2  . diclofenac (VOLTAREN) 75 MG EC tablet TAKE 1 TABLET BY MOUTH TWICE DAILY. 60  tablet 3  . fluticasone (FLONASE) 50 MCG/ACT nasal spray Place 2 sprays into both nostrils daily. 16 g 2  . furosemide (LASIX) 40 MG tablet Take 1 tablet (40 mg total) by mouth 2 (two) times daily. 180 tablet 3  . HYDROcodone-acetaminophen (NORCO/VICODIN) 5-325 MG per tablet Take 1 tablet by mouth every 6 (six) hours as needed for moderate pain. 65 tablet 0  . losartan (COZAAR) 50 MG tablet TAKE 1 TABLET(50 MG) BY MOUTH DAILY 30 tablet 5  . montelukast (SINGULAIR) 10 MG tablet Take 1 tablet (10 mg total) by mouth at bedtime. 30 tablet 6  . Nebulizers (NEBULIZER COMPRESSOR) MISC Use nebulizer qid as needed (Patient taking differently: Use nebulizer four times a day as needed for shortness of breath or wheezing) 1 each 0  . nitroGLYCERIN (NITROSTAT) 0.4 MG SL tablet Place 1 tablet (0.4 mg total) under the tongue every 5 (five) minutes as needed. 25 tablet 3  . omeprazole (PRILOSEC) 40 MG capsule Take 1 capsule by mouth every morning on an empty stomach. Take 30 minutes before eating. 30 capsule 3  . pravastatin (PRAVACHOL) 40 MG tablet Take 1 tablet (40 mg total) by mouth every evening. 30 tablet 11  . sildenafil (VIAGRA) 50 MG  tablet Take 1 tablet (50 mg total) by mouth daily as needed for erectile dysfunction. 6 tablet 5  . tiotropium (SPIRIVA) 18 MCG inhalation capsule Place 1 capsule (18 mcg total) into inhaler and inhale daily. 30 capsule 6   No current facility-administered medications on file prior to visit.     Review of Systems    Constitutional:   No  weight loss, night sweats,  Fevers, chills, fatigue, or  lassitude.  HEENT:   No headaches,  Difficulty swallowing,  Tooth/dental problems, or  Sore throat,                No sneezing, itching, ear ache, + nasal congestion, post nasal drip,   CV:  No chest pain,  Orthopnea, PND, swelling in lower extremities, anasarca, dizziness, palpitations, syncope.   GI  No heartburn, indigestion, abdominal pain, nausea, vomiting, diarrhea, change  in bowel habits, loss of appetite, bloody stools.   Resp:   No chest wall deformity  Skin: no rash or lesions.  GU: no dysuria, change in color of urine, no urgency or frequency.  No flank pain, no hematuria   MS:  No joint pain or swelling.  No decreased range of motion.  No back pain.  Psych:  No change in mood or affect. No depression or anxiety.  No memory loss.      Objective:   Physical Exam Filed Vitals:   07/28/15 1001  BP: 98/66  Pulse: 74  Temp: 98.3 F (36.8 C)  TempSrc: Oral  Height: 5\' 11"  (1.803 m)  Weight: 212 lb (96.163 kg)  SpO2: 97%   GEN: A/Ox3; pleasant , NAD, well nourished   HEENT:  [place]/AT,  EACs-clear, TMs-wnl, NOSE-clear, draingae  THROAT-clear, no lesions, no postnasal drip or exudate noted.   NECK:  Supple w/ fair ROM; no JVD; normal carotid impulses w/o bruits; no thyromegaly or nodules palpated; no lymphadenopathy.  RESP  Few exp wheezes , no accessory muscle use, no dullness to percussion  CARD:  RRR, no m/r/g  , no peripheral edema, pulses intact, no cyanosis or clubbing.  GI:   Soft & nt; nml bowel sounds; no organomegaly or masses detected.  Musco: Warm bil, no deformities or joint swelling noted.   Neuro: alert, no focal deficits noted.    Skin: Warm, no lesions or rashes  Tammy Parrett NP-C  [facility]  07/28/2015        Assessment & Plan:

## 2015-07-28 NOTE — Patient Instructions (Signed)
Zpack take directed .  Mucinex DM Twice daily  As needed  Cough/congestion  Zyrtec 10mg  At bedtime  As needed  Drainage  Fluids and rest  Prednisone taper over next week.  Please contact office for sooner follow up if symptoms do not improve or worsen or seek emergency care  Follow up Dr. Chase Caller in 3 months and As needed

## 2015-07-29 ENCOUNTER — Telehealth: Payer: Self-pay | Admitting: Internal Medicine

## 2015-07-29 NOTE — Telephone Encounter (Signed)
ERROR

## 2015-07-29 NOTE — Telephone Encounter (Signed)
#   vials:6 Ordered date:07/29/15 Shipping Date:07/3015   # Vials:6 Arrival Date:07/31/15 Lot [id number] Exp Date:11/20

## 2015-08-05 ENCOUNTER — Telehealth: Payer: Self-pay | Admitting: Internal Medicine

## 2015-08-05 MED ORDER — EPINEPHRINE 0.3 MG/0.3ML IJ SOAJ: 0.3000 mg | Freq: Once | INTRAMUSCULAR | Status: AC

## 2015-08-05 NOTE — Telephone Encounter (Signed)
Pt is aware that Xolair medication has arrived in our office and he is ready to start first injections. Pt is aware of 2 hour wait after receiving injections and EPIPEN in hand is required. Epipen Rx sent to [facility] and [place] location. Pt has been scheduled Thursday 08-13-15 at 9:00am. All files and packet given to allergy department. Nothing more needed at this time.

## 2015-08-13 ENCOUNTER — Ambulatory Visit (INDEPENDENT_AMBULATORY_CARE_PROVIDER_SITE_OTHER): Payer: BC Managed Care – PPO

## 2015-08-13 DIAGNOSIS — J4540 Moderate persistent asthma, uncomplicated: Secondary | ICD-10-CM | POA: Diagnosis not present

## 2015-08-13 MED ORDER — OMALIZUMAB 150 MG ~~LOC~~ SOLR
375.0000 mg | Freq: Once | SUBCUTANEOUS | Status: AC
  Administered 2015-08-13: 375 mg via SUBCUTANEOUS

## 2015-08-27 ENCOUNTER — Ambulatory Visit (INDEPENDENT_AMBULATORY_CARE_PROVIDER_SITE_OTHER): Payer: BC Managed Care – PPO

## 2015-08-27 DIAGNOSIS — J4540 Moderate persistent asthma, uncomplicated: Secondary | ICD-10-CM | POA: Diagnosis not present

## 2015-08-27 MED ORDER — OMALIZUMAB 150 MG ~~LOC~~ SOLR
375.0000 mg | Freq: Once | SUBCUTANEOUS | Status: AC
  Administered 2015-08-27: 375 mg via SUBCUTANEOUS

## 2015-09-03 ENCOUNTER — Other Ambulatory Visit: Payer: Self-pay | Admitting: Internal Medicine

## 2015-09-11 ENCOUNTER — Telehealth: Payer: Self-pay | Admitting: Internal Medicine

## 2015-09-11 NOTE — Telephone Encounter (Signed)
#   vials:6 Ordered date:09/11/15 Shipping Date:09/15/15

## 2015-09-16 NOTE — Telephone Encounter (Signed)
#   Vials:6 Arrival Date:09/16/15 Lot [id number] Exp Date:12/20

## 2015-09-17 ENCOUNTER — Ambulatory Visit (INDEPENDENT_AMBULATORY_CARE_PROVIDER_SITE_OTHER): Payer: BC Managed Care – PPO

## 2015-09-17 DIAGNOSIS — J4540 Moderate persistent asthma, uncomplicated: Secondary | ICD-10-CM | POA: Diagnosis not present

## 2015-09-17 MED ORDER — OMALIZUMAB 150 MG ~~LOC~~ SOLR
375.0000 mg | Freq: Once | SUBCUTANEOUS | Status: AC
  Administered 2015-09-17: 375 mg via SUBCUTANEOUS

## 2015-10-01 ENCOUNTER — Telehealth: Payer: Self-pay | Admitting: Internal Medicine

## 2015-10-01 ENCOUNTER — Ambulatory Visit (INDEPENDENT_AMBULATORY_CARE_PROVIDER_SITE_OTHER): Payer: BC Managed Care – PPO

## 2015-10-01 DIAGNOSIS — J4540 Moderate persistent asthma, uncomplicated: Secondary | ICD-10-CM

## 2015-10-01 MED ORDER — OMALIZUMAB 150 MG ~~LOC~~ SOLR
375.0000 mg | Freq: Once | SUBCUTANEOUS | Status: AC
  Administered 2015-10-01: 375 mg via SUBCUTANEOUS

## 2015-10-01 NOTE — Telephone Encounter (Signed)
#   vials:6 Ordered date:10/01/15 Shipping Date:10/12/15 I asked that the xolair be delivered 10/13/15. Fax sent ok. I have the paper to prove it.

## 2015-10-06 ENCOUNTER — Other Ambulatory Visit: Payer: Self-pay | Admitting: Physician Assistant

## 2015-10-06 ENCOUNTER — Other Ambulatory Visit: Payer: Self-pay | Admitting: Internal Medicine

## 2015-10-13 NOTE — Telephone Encounter (Signed)
#   Vials:6 Arrival Date:10/13/15 Lot [id number] Exp Date:6/20

## 2015-10-15 ENCOUNTER — Ambulatory Visit: Payer: BC Managed Care – PPO

## 2015-10-15 ENCOUNTER — Ambulatory Visit (INDEPENDENT_AMBULATORY_CARE_PROVIDER_SITE_OTHER): Payer: BC Managed Care – PPO

## 2015-10-15 DIAGNOSIS — J4540 Moderate persistent asthma, uncomplicated: Secondary | ICD-10-CM

## 2015-10-15 MED ORDER — OMALIZUMAB 150 MG ~~LOC~~ SOLR
375.0000 mg | Freq: Once | SUBCUTANEOUS | Status: AC
  Administered 2015-10-15: 375 mg via SUBCUTANEOUS

## 2015-10-29 ENCOUNTER — Ambulatory Visit (INDEPENDENT_AMBULATORY_CARE_PROVIDER_SITE_OTHER): Payer: BC Managed Care – PPO

## 2015-10-29 DIAGNOSIS — J4540 Moderate persistent asthma, uncomplicated: Secondary | ICD-10-CM | POA: Diagnosis not present

## 2015-10-29 MED ORDER — OMALIZUMAB 150 MG ~~LOC~~ SOLR
375.0000 mg | Freq: Once | SUBCUTANEOUS | Status: AC
  Administered 2015-10-29: 375 mg via SUBCUTANEOUS

## 2015-10-30 ENCOUNTER — Telehealth: Payer: Self-pay | Admitting: Internal Medicine

## 2015-10-30 NOTE — Telephone Encounter (Signed)
#   vials:6 Ordered date:10/29/15 totally forgot to put it in. Sorry! Shipping Date:11/06/15 faxed and asked it be delivered then.

## 2015-11-04 ENCOUNTER — Ambulatory Visit: Payer: BC Managed Care – PPO | Admitting: Internal Medicine

## 2015-11-05 ENCOUNTER — Encounter: Payer: Self-pay | Admitting: Internal Medicine

## 2015-11-05 ENCOUNTER — Ambulatory Visit (INDEPENDENT_AMBULATORY_CARE_PROVIDER_SITE_OTHER): Payer: BC Managed Care – PPO | Admitting: Internal Medicine

## 2015-11-05 VITALS — BP 124/82 | HR 83 | Ht 71.0 in | Wt 221.0 lb

## 2015-11-05 DIAGNOSIS — J4540 Moderate persistent asthma, uncomplicated: Secondary | ICD-10-CM

## 2015-11-05 LAB — NITRIC OXIDE: Nitric Oxide: 21

## 2015-11-05 NOTE — Patient Instructions (Addendum)
ICD-9-CM ICD-10-CM   1. Moderate persistent asthma, uncomplicated [id number]    Well controlled based on symptoms and nitric oxide testing  Plan - continue xolair - continue spiriva, breo, singulair - use albuterol as needed - flu shot in fall  Followup  6 months or sooner if needed; ACQ and FeNO at followup  -if doing well at followup can consider stopping spiriva

## 2015-11-05 NOTE — Progress Notes (Signed)
Subjective:     Patient ID: Marvin Chandler, male   DOB: [birth year], 65 y.o.   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   07/28/2015 Acute OV : Asthma  Pt presents for an acute office visit.  Complains of 1 week of  prod cough with yellow mucus, chest congestion/discomfort, wheezing and SOB x 1 week. Fever late last week. Denies any nausea or vomiting.  Family is sick with similar symptoms.  Appetite is god. No increased leg sweling, no orthopnea, chest pain.  Remains on Singulair , Spiriva , BREO .  Taking cold meds otc.     OV 11/05/2015  Chief Complaint  Patient presents with  . Follow-up    coughing up green mucus.  no chest tightness, no other concerns.    Moderate persistent asthma with high IgE but normal eosinophils on Spiriva, Brio, Singulair and on Xolair since early 2017. April 2016 post bronchodilator FEV1 2.33 L/75% which is a 10% bronchial dilator responsiveness and a ratio of 63.  There is a follow-up since his last visit with Dr. Annamaria Boots in January 2017. He says the Xolair has made a big difference for him. His asthma is no longer problematic. He does not wake up in the middle of the night symptoms. He does not use albuterol for rescue. He has not had asthma exacerbations. He is able to do his physical work without much problems. He does have mild amount of green baseline mucus when he coughs but this is baseline and does not bother him. This no chest tightness or wheeze. He is very happy with his current regimen.  Exhaled nitric oxide today 11/05/2015:      has a past medical history of Asthma; Cough; Hiatal hernia; Osteoarthritis; Arthritis of hip; Personal history of colonic adenomas (02/20/2013); Pneumonia;  NICM (nonischemic cardiomyopathy) ([place]); Chronic systolic CHF (congestive heart failure) ([place]); and CAD (coronary artery disease).   reports that he quit smoking about 18 years ago. His smoking use included Cigarettes. He has a 7 pack-year smoking history. He has never used smokeless tobacco.  Past Surgical History  Procedure Laterality Date  . Total hip arthroplasty  2002    bilateral  . Colonoscopy  02/13/13  . Left and right heart catheterization with coronary angiogram N/A 08/18/2014    Procedure: LEFT AND RIGHT HEART CATHETERIZATION WITH CORONARY ANGIOGRAM;  Surgeon: [name], MD;  Location: [facility] CATH LAB;  Service: Cardiovascular;  Laterality: N/A;    No Known Allergies  Immunization History  Administered Date(s) Administered  . Influenza,inj,Quad PF,36+ Mos 07/17/2014, 01/21/2015  . Pneumococcal Polysaccharide-23 07/17/2014  . Tdap 12/31/2013    Family History  Problem Relation Age of Onset  . Dementia Mother   . Colon cancer Neg Hx   . Esophageal cancer Neg Hx   . Cancer    . Heart attack Neg Hx   . Stroke Cousin   . Cancer Maternal Aunt   . Hypertension Neg Hx   . Diabetes Cousin      Current outpatient prescriptions:  .  albuterol (PROVENTIL) (2.5 MG/3ML) 0.083% nebulizer solution, Take 3 mLs (2.5 mg total) by nebulization every 6 (six) hours as needed for wheezing or shortness  of breath., Disp: 300 mL, Rfl: 5 .  aspirin 81 MG tablet, Take 81 mg by mouth daily., Disp: , Rfl:  .  BREO ELLIPTA 200-25 MCG/INH AEPB, INHALE 1 PUFF BY MOUTH DAILY, Disp: 60 each, Rfl: 2 .  diclofenac (VOLTAREN) 75 MG EC tablet, TAKE 1 TABLET BY MOUTH TWICE DAILY., Disp: 60 tablet, Rfl: 3 .  EPINEPHrine 0.3 mg/0.3 mL IJ SOAJ injection, Inject 0.3 mLs (0.3 mg total) into the muscle once., Disp: 1 Device, Rfl: 11 .  fluticasone (FLONASE) 50 MCG/ACT nasal spray, Place 2 sprays into both nostrils daily., Disp: 16 g, Rfl: 2 .  furosemide (LASIX) 40 MG tablet, Take 1 tablet (40 mg  total) by mouth 2 (two) times daily. Please call and schedule a one year follow up appointment, Disp: 180 tablet, Rfl: 0 .  HYDROcodone-acetaminophen (NORCO/VICODIN) 5-325 MG per tablet, Take 1 tablet by mouth every 6 (six) hours as needed for moderate pain., Disp: 65 tablet, Rfl: 0 .  losartan (COZAAR) 50 MG tablet, TAKE 1 TABLET(50 MG) BY MOUTH DAILY, Disp: 30 tablet, Rfl: 5 .  montelukast (SINGULAIR) 10 MG tablet, Take 1 tablet (10 mg total) by mouth at bedtime., Disp: 30 tablet, Rfl: 6 .  Nebulizers (NEBULIZER COMPRESSOR) MISC, Use nebulizer qid as needed (Patient taking differently: Use nebulizer four times a day as needed for shortness of breath or wheezing), Disp: 1 each, Rfl: 0 .  nitroGLYCERIN (NITROSTAT) 0.4 MG SL tablet, Place 1 tablet (0.4 mg total) under the tongue every 5 (five) minutes as needed., Disp: 25 tablet, Rfl: 3 .  NON FORMULARY, Allergy injections, Disp: , Rfl:  .  omeprazole (PRILOSEC) 40 MG capsule, Take 1 capsule by mouth every morning on an empty stomach. Take 30 minutes before eating., Disp: 30 capsule, Rfl: 3 .  pravastatin (PRAVACHOL) 40 MG tablet, Take 1 tablet (40 mg total) by mouth every evening., Disp: 30 tablet, Rfl: 11 .  sildenafil (VIAGRA) 50 MG tablet, Take 1 tablet (50 mg total) by mouth daily as needed for erectile dysfunction., Disp: 6 tablet, Rfl: 5 .  SPIRIVA HANDIHALER 18 MCG inhalation capsule, INHALE THE CONTENTS OF 1 CAPSULE VIA HANDIHALER BY MOUTH DAILY, Disp: 30 capsule, Rfl: 3 .  VENTOLIN HFA 108 (90 Base) MCG/ACT inhaler, INHALE 2 PUFFS BY MOUTH EVERY 4 HOURS AS NEEDED, Disp: 18 g, Rfl: 2     Review of Systems     Objective:   Physical Exam  Constitutional: He is oriented to person, place, and time. He appears well-developed and well-nourished. No distress.  HENT:  Head: Normocephalic and atraumatic.  Right Ear: External ear normal.  Left Ear: External ear normal.  Mouth/Throat: Oropharynx is clear and moist. No oropharyngeal exudate.   Eyes: Conjunctivae and EOM are normal. Pupils are equal, round, and reactive to light. Right eye exhibits no discharge. Left eye exhibits no discharge. No scleral icterus.  Neck: Normal range of motion. Neck supple. No JVD present. No tracheal deviation present. No thyromegaly present.  Cardiovascular: Normal rate, regular rhythm and intact distal pulses.  Exam reveals no gallop and no friction rub.   No murmur heard. Pulmonary/Chest: Effort normal and breath sounds normal. No respiratory distress. He has no wheezes. He has no rales. He exhibits no tenderness.  Abdominal: Soft. Bowel sounds are normal. He exhibits no distension and no mass. There is no tenderness. There is no rebound and no guarding.  Musculoskeletal: Normal range of motion. He exhibits no edema or tenderness.  Lymphadenopathy:  He has no cervical adenopathy.  Neurological: He is alert and oriented to person, place, and time. He has normal reflexes. No cranial nerve deficit. Coordination normal.  Skin: Skin is warm and dry. No rash noted. He is not diaphoretic. No erythema. No pallor.  Psychiatric: He has a normal mood and affect. His behavior is normal. Judgment and thought content normal.  Nursing note and vitals reviewed.  Filed Vitals:   11/05/15 1651  BP: 124/82  Pulse: 83  Height: 5\' 11"  (1.803 m)  Weight: 221 lb (100.245 kg)  SpO2: 93%       Assessment:       ICD-9-CM ICD-10-CM   1. Moderate persistent asthma, uncomplicated 123456 [id number]        Plan:      Well controlled based on symptoms and nitric oxide testing  Plan - continue xolair - continue spiriva, breo, singulair - use albuterol as needed - flu shot in fall  Followup  6 months or sooner if needed; ACQ and FeNO at followup  -if doing well at followup can consider stopping spiriva   Dr. Brand Males, M.D., [facility].C.P Pulmonary and Critical Care Medicine Staff Physician [facility] Pager:  [phone], If no answer or between  15:00h - 7:00h: call [phone]  11/05/2015 5:07 PM

## 2015-11-06 NOTE — Telephone Encounter (Signed)
#   Vials:6 Arrival Date:11/06/15 Lot [id number] Exp Date:12/20

## 2015-11-12 ENCOUNTER — Ambulatory Visit (INDEPENDENT_AMBULATORY_CARE_PROVIDER_SITE_OTHER): Payer: BC Managed Care – PPO

## 2015-11-12 DIAGNOSIS — J4540 Moderate persistent asthma, uncomplicated: Secondary | ICD-10-CM

## 2015-11-12 MED ORDER — OMALIZUMAB 150 MG ~~LOC~~ SOLR
375.0000 mg | Freq: Once | SUBCUTANEOUS | Status: AC
Start: 2015-11-12 — End: 2015-11-12
  Administered 2015-11-12: 375 mg via SUBCUTANEOUS

## 2015-11-13 ENCOUNTER — Other Ambulatory Visit: Payer: Self-pay | Admitting: Physician Assistant

## 2015-11-26 ENCOUNTER — Ambulatory Visit (INDEPENDENT_AMBULATORY_CARE_PROVIDER_SITE_OTHER): Payer: BC Managed Care – PPO

## 2015-11-26 DIAGNOSIS — J4540 Moderate persistent asthma, uncomplicated: Secondary | ICD-10-CM

## 2015-11-26 MED ORDER — OMALIZUMAB 150 MG ~~LOC~~ SOLR
375.0000 mg | SUBCUTANEOUS | Status: DC
  Administered 2015-11-26: 375 mg via SUBCUTANEOUS

## 2015-12-01 NOTE — Progress Notes (Signed)
Pt's Xolair supplied by [facility]

## 2015-12-03 ENCOUNTER — Telehealth: Payer: Self-pay | Admitting: Internal Medicine

## 2015-12-03 NOTE — Telephone Encounter (Signed)
#   vials:6 Ordered date:12/03/15 Shipping Date:12/03/15

## 2015-12-08 NOTE — Telephone Encounter (Signed)
#   Vials:6 Arrival Date:12/08/15 Lot [id number] Exp Date:12/20

## 2015-12-10 ENCOUNTER — Ambulatory Visit (INDEPENDENT_AMBULATORY_CARE_PROVIDER_SITE_OTHER): Payer: BC Managed Care – PPO

## 2015-12-10 DIAGNOSIS — J4540 Moderate persistent asthma, uncomplicated: Secondary | ICD-10-CM

## 2015-12-10 MED ORDER — OMALIZUMAB 150 MG ~~LOC~~ SOLR
375.0000 mg | SUBCUTANEOUS | Status: DC
  Administered 2015-12-10: 375 mg via SUBCUTANEOUS

## 2015-12-19 ENCOUNTER — Other Ambulatory Visit: Payer: Self-pay | Admitting: Internal Medicine

## 2015-12-25 ENCOUNTER — Ambulatory Visit: Payer: BC Managed Care – PPO

## 2015-12-28 ENCOUNTER — Ambulatory Visit (INDEPENDENT_AMBULATORY_CARE_PROVIDER_SITE_OTHER): Payer: BC Managed Care – PPO

## 2015-12-28 ENCOUNTER — Telehealth: Payer: Self-pay | Admitting: Internal Medicine

## 2015-12-28 DIAGNOSIS — J4540 Moderate persistent asthma, uncomplicated: Secondary | ICD-10-CM | POA: Diagnosis not present

## 2015-12-28 MED ORDER — OMALIZUMAB 150 MG ~~LOC~~ SOLR
375.0000 mg | Freq: Once | SUBCUTANEOUS | Status: AC
  Administered 2015-12-28: 375 mg via SUBCUTANEOUS

## 2015-12-28 NOTE — Telephone Encounter (Signed)
#   vials:6 Ordered date:12/28/15 Shipping Date:01/11/16

## 2016-01-06 ENCOUNTER — Other Ambulatory Visit: Payer: Self-pay | Admitting: Cardiovascular Disease

## 2016-01-06 MED ORDER — PRAVASTATIN SODIUM 40 MG PO TABS: 40.0000 mg | ORAL_TABLET | Freq: Every evening | ORAL | 0 refills | Status: DC

## 2016-01-12 NOTE — Telephone Encounter (Signed)
Routed to [employer] as [employer]

## 2016-01-12 NOTE — Telephone Encounter (Signed)
Marvin Chandler from [facility] called and states that [place] shipment is scheduled for tomorrow 01/13/16. Patient ID number is [id number]- The contact number is [phone]

## 2016-01-13 NOTE — Telephone Encounter (Signed)
That's fine his appt. Is the 14th. Nothing further needed. Closing.

## 2016-01-13 NOTE — Telephone Encounter (Signed)
#   Vials:6 Arrival Date:01/13/16  Lot [id number] Exp Date:07/2019

## 2016-01-14 ENCOUNTER — Ambulatory Visit: Payer: BC Managed Care – PPO

## 2016-01-19 ENCOUNTER — Ambulatory Visit (INDEPENDENT_AMBULATORY_CARE_PROVIDER_SITE_OTHER): Payer: BC Managed Care – PPO

## 2016-01-19 DIAGNOSIS — J4540 Moderate persistent asthma, uncomplicated: Secondary | ICD-10-CM | POA: Diagnosis not present

## 2016-01-19 MED ORDER — OMALIZUMAB 150 MG ~~LOC~~ SOLR
375.0000 mg | SUBCUTANEOUS | Status: DC
  Administered 2016-01-19 – 2016-02-02 (×2): 375 mg via SUBCUTANEOUS

## 2016-02-02 ENCOUNTER — Other Ambulatory Visit: Payer: Self-pay | Admitting: Physician Assistant

## 2016-02-02 ENCOUNTER — Ambulatory Visit (INDEPENDENT_AMBULATORY_CARE_PROVIDER_SITE_OTHER): Payer: BC Managed Care – PPO

## 2016-02-02 DIAGNOSIS — J4540 Moderate persistent asthma, uncomplicated: Secondary | ICD-10-CM

## 2016-02-02 MED ORDER — OMALIZUMAB 150 MG ~~LOC~~ SOLR
375.0000 mg | Freq: Once | SUBCUTANEOUS | Status: AC
  Administered 2016-02-02: 375 mg via SUBCUTANEOUS

## 2016-02-03 ENCOUNTER — Other Ambulatory Visit: Payer: Self-pay | Admitting: *Deleted

## 2016-02-03 ENCOUNTER — Other Ambulatory Visit: Payer: Self-pay | Admitting: Internal Medicine

## 2016-02-03 MED ORDER — LOSARTAN POTASSIUM 50 MG PO TABS: 50.0000 mg | ORAL_TABLET | Freq: Every day | ORAL | 1 refills | Status: DC

## 2016-02-03 MED ORDER — FUROSEMIDE 40 MG PO TABS: 40.0000 mg | ORAL_TABLET | Freq: Two times a day (BID) | ORAL | 1 refills | Status: DC

## 2016-02-03 NOTE — Telephone Encounter (Signed)
#   vials:6 Ordered date:01/04/16 Shipping Date:02/09/16

## 2016-02-07 ENCOUNTER — Other Ambulatory Visit: Payer: Self-pay | Admitting: Cardiovascular Disease

## 2016-02-09 ENCOUNTER — Ambulatory Visit (INDEPENDENT_AMBULATORY_CARE_PROVIDER_SITE_OTHER): Payer: BC Managed Care – PPO

## 2016-02-09 DIAGNOSIS — Z23 Encounter for immunization: Secondary | ICD-10-CM | POA: Diagnosis not present

## 2016-02-15 NOTE — Telephone Encounter (Signed)
While checking on empty xolair in allergy fridge, I noticed that pt does not have xolair on supply, although below documentation states that this was received on 02/09/16. Called and spoke with Lanelle Bal at [facility], who verified that the last shipment for pt's xolair was on 01/13/16.  Per Lanelle Bal, no shipments have been coordinated through our office since then.  Pt is scheduled to come in tomorrow (02/16/16) for xolair injections.  I have scheduled an ASAP shipment, which will not be shipped until 02/23/2016 per [place].  Per Clayborne Dana, allergy team lead, pt is to receive sample vaccine since this is our office's mistake.  # vials:6 Ordered date:02/15/16  Shipping Date:02/23/2016  Forwarding to [employer] and Clayborne Dana to follow up on.

## 2016-02-16 ENCOUNTER — Telehealth: Payer: Self-pay | Admitting: *Deleted

## 2016-02-16 ENCOUNTER — Ambulatory Visit: Payer: BC Managed Care – PPO

## 2016-02-18 ENCOUNTER — Ambulatory Visit: Payer: BC Managed Care – PPO

## 2016-02-19 ENCOUNTER — Ambulatory Visit (INDEPENDENT_AMBULATORY_CARE_PROVIDER_SITE_OTHER): Payer: BC Managed Care – PPO

## 2016-02-19 DIAGNOSIS — J4540 Moderate persistent asthma, uncomplicated: Secondary | ICD-10-CM | POA: Diagnosis not present

## 2016-02-19 MED ORDER — OMALIZUMAB 150 MG ~~LOC~~ SOLR
375.0000 mg | SUBCUTANEOUS | Status: DC
  Administered 2016-02-19: 375 mg via SUBCUTANEOUS

## 2016-02-22 NOTE — Telephone Encounter (Signed)
Called pharm. 02/16/16 I did fax the infusion form 02/02/16 (I have the paper). I request a delivery 02/10/16 for 02/16/16 appt.. Gave pt. Samples per Joellen Jersey. They gave a 3 to 4 wk. Old excuse about some computer updates. I should have called like I usually do, it slipped my mind. My bad. Nothing further needed. Closing.

## 2016-02-23 NOTE — Telephone Encounter (Signed)
#   Vials:6 Arrival Date:02/23/16 Lot [id number] Exp Date:3/21

## 2016-02-23 NOTE — Telephone Encounter (Signed)
#   vials:6 Ordered date:02/16/16 Shipping Date:02/22/16

## 2016-03-07 ENCOUNTER — Other Ambulatory Visit: Payer: Self-pay | Admitting: Cardiovascular Disease

## 2016-03-07 ENCOUNTER — Ambulatory Visit (INDEPENDENT_AMBULATORY_CARE_PROVIDER_SITE_OTHER): Payer: BC Managed Care – PPO | Admitting: Cardiovascular Disease

## 2016-03-07 ENCOUNTER — Encounter: Payer: Self-pay | Admitting: Cardiovascular Disease

## 2016-03-07 VITALS — BP 120/76 | HR 86 | Ht 70.0 in | Wt 225.6 lb

## 2016-03-07 DIAGNOSIS — I428 Other cardiomyopathies: Secondary | ICD-10-CM | POA: Diagnosis not present

## 2016-03-07 DIAGNOSIS — I5022 Chronic systolic (congestive) heart failure: Secondary | ICD-10-CM

## 2016-03-07 DIAGNOSIS — I4891 Unspecified atrial fibrillation: Secondary | ICD-10-CM | POA: Diagnosis not present

## 2016-03-07 DIAGNOSIS — I2510 Atherosclerotic heart disease of native coronary artery without angina pectoris: Secondary | ICD-10-CM

## 2016-03-07 LAB — CBC WITH DIFFERENTIAL/PLATELET
BASOS ABS: 0 {cells}/uL (ref 0–200)
Basophils Relative: 0 %
EOS PCT: 1 %
Eosinophils Absolute: 71 cells/uL (ref 15–500)
HEMATOCRIT: 40.7 % (ref 38.5–50.0)
HEMOGLOBIN: 14 g/dL (ref 13.2–17.1)
LYMPHS ABS: 2627 {cells}/uL (ref 850–3900)
Lymphocytes Relative: 37 %
MCH: 29.5 pg (ref 27.0–33.0)
MCHC: 34.4 g/dL (ref 32.0–36.0)
MCV: 85.9 fL (ref 80.0–100.0)
MONO ABS: 497 {cells}/uL (ref 200–950)
MPV: 10.5 fL (ref 7.5–12.5)
Monocytes Relative: 7 %
NEUTROS ABS: 3905 {cells}/uL (ref 1500–7800)
NEUTROS PCT: 55 %
Platelets: 232 10*3/uL (ref 140–400)
RBC: 4.74 MIL/uL (ref 4.20–5.80)
RDW: 13.1 % (ref 11.0–15.0)
WBC: 7.1 10*3/uL (ref 3.8–10.8)

## 2016-03-07 LAB — BASIC METABOLIC PANEL
BUN: 16 mg/dL (ref 7–25)
CO2: 28 mmol/L (ref 20–31)
Calcium: 9.2 mg/dL (ref 8.6–10.3)
Chloride: 104 mmol/L (ref 98–110)
Creat: 1.22 mg/dL (ref 0.70–1.25)
GLUCOSE: 104 mg/dL — AB (ref 65–99)
POTASSIUM: 3.7 mmol/L (ref 3.5–5.3)
SODIUM: 141 mmol/L (ref 135–146)

## 2016-03-07 MED ORDER — RIVAROXABAN 20 MG PO TABS: 20.0000 mg | ORAL_TABLET | Freq: Every day | ORAL | 6 refills | Status: DC

## 2016-03-07 NOTE — Patient Instructions (Signed)
Medication Instructions:  Your physician has recommended you make the following change in your medication:  Start Xarelto 20 mg by mouth daily. Stop Diclofenac.  Use acetaminophen as needed for pain.  Follow label instructions.     Atrial Fibrillation Atrial fibrillation is a type of irregular or rapid heartbeat (arrhythmia). In atrial fibrillation, the heart quivers continuously in a chaotic pattern. This occurs when parts of the heart receive disorganized signals that make the heart unable to pump blood normally. This can increase the risk for stroke, heart failure, and other heart-related conditions. There are different types of atrial fibrillation, including:  Paroxysmal atrial fibrillation. This type starts suddenly, and it usually stops on its own shortly after it starts.  Persistent atrial fibrillation. This type often lasts longer than a week. It may stop on its own or with treatment.  Long-lasting persistent atrial fibrillation. This type lasts longer than 12 months.  Permanent atrial fibrillation. This type does not go away. Talk with your health care provider to learn about the type of atrial fibrillation that you have. CAUSES This condition is caused by some heart-related conditions or procedures, including:  A heart attack.  Coronary artery disease.  Heart failure.  Heart valve conditions.  High blood pressure.  Inflammation of the sac that surrounds the heart (pericarditis).  Heart surgery.  Certain heart rhythm disorders, such as Wolf-Parkinson-White syndrome. Other causes include:  Pneumonia.  Obstructive sleep apnea.  Blockage of an artery in the lungs (pulmonary embolism, or PE).  Lung cancer.  Chronic lung disease.  Thyroid problems, especially if the thyroid is overactive (hyperthyroidism).  Caffeine.  Excessive alcohol use or illegal drug use.  Use of some medicines, including certain decongestants and diet pills. Sometimes, the cause  cannot be found. RISK FACTORS This condition is more likely to develop in:  People who are older in age.  People who smoke.  People who have diabetes mellitus.  People who are overweight (obese).  Athletes who exercise vigorously. SYMPTOMS Symptoms of this condition include:  A feeling that your heart is beating rapidly or irregularly.  A feeling of discomfort or pain in your chest.  Shortness of breath.  Sudden light-headedness or weakness.  Getting tired easily during exercise. In some cases, there are no symptoms. DIAGNOSIS Your health care provider may be able to detect atrial fibrillation when taking your pulse. If detected, this condition may be diagnosed with:  An electrocardiogram (ECG).  A Holter monitor test that records your heartbeat patterns over a 24-hour period.  Transthoracic echocardiogram (TTE) to evaluate how blood flows through your heart.  Transesophageal echocardiogram (TEE) to view more detailed images of your heart.  A stress test.  Imaging tests, such as a CT scan or chest X-ray.  Blood tests. TREATMENT The main goals of treatment are to prevent blood clots from forming and to keep your heart beating at a normal rate and rhythm. The type of treatment that you receive depends on many factors, such as your underlying medical conditions and how you feel when you are experiencing atrial fibrillation. This condition may be treated with:  Medicine to slow down the heart rate, bring the heart's rhythm back to normal, or prevent clots from forming.  Electrical cardioversion. This is a procedure that resets your heart's rhythm by delivering a controlled, low-energy shock to the heart through your skin.  Different types of ablation, such as catheter ablation, catheter ablation with pacemaker, or surgical ablation. These procedures destroy the heart tissues that send abnormal  signals. When the pacemaker is used, it is placed under your skin to help your  heart beat in a regular rhythm. HOME CARE INSTRUCTIONS  Take over-the counter and prescription medicines only as told by your health care provider.  If your health care provider prescribed a blood-thinning medicine (anticoagulant), take it exactly as told. Taking too much blood-thinning medicine can cause bleeding. If you do not take enough blood-thinning medicine, you will not have the protection that you need against stroke and other problems.  Do not use tobacco products, including cigarettes, chewing tobacco, and e-cigarettes. If you need help quitting, ask your health care provider.  If you have obstructive sleep apnea, manage your condition as told by your health care provider.  Do not drink alcohol.  Do not drink beverages that contain caffeine, such as coffee, soda, and tea.  Maintain a healthy weight. Do not use diet pills unless your health care provider approves. Diet pills may make heart problems worse.  Follow diet instructions as told by your health care provider.  Exercise regularly as told by your health care provider.  Keep all follow-up visits as told by your health care provider. This is important. PREVENTION  Avoid drinking beverages that contain caffeine or alcohol.  Avoid certain medicines, especially medicines that are used for breathing problems.  Avoid certain herbs and herbal medicines, such as those that contain ephedra or ginseng.  Do not use illegal drugs, such as cocaine and amphetamines.  Do not smoke.  Manage your high blood pressure. SEEK MEDICAL CARE IF:  You notice a change in the rate, rhythm, or strength of your heartbeat.  You are taking an anticoagulant and you notice increased bruising.  You tire more easily when you exercise or exert yourself. SEEK IMMEDIATE MEDICAL CARE IF:  You have chest pain, abdominal pain, sweating, or weakness.  You feel nauseous.  You notice blood in your vomit, bowel movement, or urine.  You have  shortness of breath.  You suddenly have swollen feet and ankles.  You feel dizzy.  You have sudden weakness or numbness of the face, arm, or leg, especially on one side of the body.  You have trouble speaking, trouble understanding, or both (aphasia).  Your face or your eyelid droops on one side. These symptoms may represent a serious problem that is an emergency. Do not wait to see if the symptoms will go away. Get medical help right away. Call your local emergency services (911 in the [place] Do not drive yourself to the hospital.   This information is not intended to replace advice given to you by your health care provider. Make sure you discuss any questions you have with your health care provider.   Document Released: 04/18/2005 Document Revised: 01/07/2015 Document Reviewed: 08/13/2014 Elsevier Interactive Patient Education 2016 [place]: Lab work to be done today--CBC, [employer]  Testing/Procedures: none  Follow-Up: Your physician recommends that you schedule a follow-up appointment in:3 months.     Any Other Special Instructions Will Be Listed Below (If Applicable).     If you need a refill on your cardiac medications before your next appointment, please call your pharmacy.

## 2016-03-07 NOTE — Progress Notes (Signed)
Chief Complaint  Patient presents with  . Cardiomyopathy    History of Present Illness: 65 yo AAM with history of CAD, non-ischemic cardiomyopathy, asthma, hiatal hernia who is here today for follow up. I saw him August 2013 as a new patient for evaluation of chest pain and an abnormal EKG. I arranged an exercise stress myoview that showed no ischemia. His LVEF was calculated at 43% but appeared better than that. Echo August 2013 with normal LVEF 50-55%, mild LVH. He was admitted March 2015 with acute hypoxic respiratory failure in the setting of acute asthma exacerbation and possible pneumonia. There was some concern for vascular congestion on CXR as well. He was treated with IV Lasix as well as a combination of BD's, prednisone and antibiotics. He had some pleuritic chest pain. CT was neg for PE. He did have elevated CEs with a flat trend. This was thought to be demand ischemia. He was not seen by Cardiology. Echo demonstrated worsening LVEF with EF 45%. He was seen by Richardson Dopp, April 2015 and he noted occasional episodes of sharp chest pain and dyspnea with minimal activity. Cardiac cath demonstrated very mild non-obstructive CAD and mild elevated filling pressures. He was felt to have a non-ischemic cardiomyopathy. Lasix was adjusted and he was placed on Losartan.  He is feeling well. No chest pain or SOB. Occasional LE edema.   Primary Care Physician: Ruben Reason, MD   Past Medical History:  Diagnosis Date  . Arthritis of hip    bilateral  . Asthma   . CAD (coronary artery disease)    LHC 5/16:  Dx ostial 30%, LAD luminal irregs, EF 40%  . Chronic systolic CHF (congestive heart failure) ([place])    a. Echo 3/16:  inf and inf-lat HK, mild LVH, EF 45%, mild to mod LAE, normal RVF  . Cough   . Hiatal hernia   . NICM (nonischemic cardiomyopathy) ([place])   . Osteoarthritis   . Personal history of colonic adenomas 02/20/2013  . Pneumonia     Past Surgical History:  Procedure  Laterality Date  . COLONOSCOPY  02/13/13  . LEFT AND RIGHT HEART CATHETERIZATION WITH CORONARY ANGIOGRAM N/A 08/18/2014   Procedure: LEFT AND RIGHT HEART CATHETERIZATION WITH CORONARY ANGIOGRAM;  Surgeon: [name], MD;  Location: [facility] CATH LAB;  Service: Cardiovascular;  Laterality: N/A;  . TOTAL HIP ARTHROPLASTY  2002   bilateral    Current Outpatient Prescriptions  Medication Sig Dispense Refill  . albuterol (PROVENTIL) (2.5 MG/3ML) 0.083% nebulizer solution Take 3 mLs (2.5 mg total) by nebulization every 6 (six) hours as needed for wheezing or shortness of breath. 300 mL 5  . BREO ELLIPTA 200-25 MCG/INH AEPB INHALE 1 PUFF BY MOUTH DAILY 1 each 5  . EPINEPHrine 0.3 mg/0.3 mL IJ SOAJ injection Inject 0.3 mLs (0.3 mg total) into the muscle once. 1 Device 11  . fluticasone (FLONASE) 50 MCG/ACT nasal spray Place 2 sprays into both nostrils daily. 16 g 2  . furosemide (LASIX) 20 MG tablet Take 20 mg by mouth 2 (two) times daily.    Marland Kitchen HYDROcodone-acetaminophen (NORCO/VICODIN) 5-325 MG per tablet Take 1 tablet by mouth every 6 (six) hours as needed for moderate pain. 65 tablet 0  . losartan (COZAAR) 50 MG tablet Take 1 tablet (50 mg total) by mouth daily. 30 tablet 1  . montelukast (SINGULAIR) 10 MG tablet TAKE 1 TABLET BY MOUTH AT BEDTIME 30 tablet 5  . Nebulizers MISC by Does not apply route 4 (  four) times daily. Use with nebulizer four times a day as needed for wheezing or sob    . nitroGLYCERIN (NITROSTAT) 0.4 MG SL tablet Place 1 tablet (0.4 mg total) under the tongue every 5 (five) minutes as needed. 25 tablet 3  . omalizumab (XOLAIR) 150 MG injection Inject 150 mg into the skin every 14 (fourteen) days.    Marland Kitchen omeprazole (PRILOSEC) 40 MG capsule Take 1 capsule by mouth every morning on an empty stomach. Take 30 minutes before eating. 30 capsule 3  . pravastatin (PRAVACHOL) 40 MG tablet Take 1 tablet (40 mg total) by mouth every evening. *Pt is overdue for an appt. Please call and  schedule for further refills to be granted* 15 tablet 0  . sildenafil (VIAGRA) 50 MG tablet Take 1 tablet (50 mg total) by mouth daily as needed for erectile dysfunction. 6 tablet 5  . SPIRIVA HANDIHALER 18 MCG inhalation capsule INHALE THE CONTENTS OF 1 CAPSULE VIA HANDIHALER BY MOUTH DAILY 30 capsule 3  . VENTOLIN HFA 108 (90 Base) MCG/ACT inhaler INHALE 2 PUFFS BY MOUTH EVERY 4 HOURS AS NEEDED 18 g 2  . rivaroxaban (XARELTO) 20 MG TABS tablet Take 1 tablet (20 mg total) by mouth daily with supper. 30 tablet 6   Current Facility-Administered Medications  Medication Dose Route Frequency Provider Last Rate Last Dose  . omalizumab Arvid Right) injection 375 mg  375 mg Subcutaneous Q14 Days Deneise Lever, MD   375 mg at 02/19/16 1420    No Known Allergies  Social History   Social History  . Marital status: Legally Separated    Spouse name: N/A  . Number of children: 2  . 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 Smoking status: Former Smoker    Packs/day: 1.00    Years: 7.00    Types: Cigarettes    Quit date: 11/05/1997  . Smokeless tobacco: Never Used  . Alcohol use No  . Drug use: No  . Sexual activity: Not on file   Other Topics Concern  . Not on file   Social History Narrative  . No narrative on file    Family History  Problem Relation Age of Onset  . Dementia Mother   . Colon cancer Neg Hx   . Esophageal cancer Neg Hx   . Cancer    . Heart attack Neg Hx   . Stroke Cousin   . Cancer Maternal Aunt   . Hypertension Neg Hx   . Diabetes Cousin     Review of Systems:  As stated in the HPI and otherwise negative.   BP 120/76   Pulse 86   Ht 5\' 10"  (1.778 m)   Wt 225 lb 9.6 oz (102.3 kg)   BMI 32.37 kg/m   Physical Examination: General: Well developed, well nourished, NAD  HEENT: OP clear, mucus membranes moist  SKIN: warm, dry. No rashes. Neuro: No focal deficits    Musculoskeletal: Muscle strength 5/5 all ext  Psychiatric: Mood and affect normal  Neck: No JVD, no carotid bruits, no thyromegaly, no lymphadenopathy.  Lungs:Clear bilaterally, no wheezes, rhonci, crackles Cardiovascular: Irreg irreg No murmurs, gallops or rubs. Abdomen:Soft. Bowel sounds present. Non-tender.  Extremities: No lower extremity edema. Pulses are 2 + in the bilateral DP/PT.  EKG:  EKG is ordered today. The ekg ordered today demonstrates   Recent Labs: No results found for requested labs  within last 8760 hours.   Lipid Panel    Component Value Date/Time   CHOL 135 03/02/2015 0814   TRIG 68 03/02/2015 0814   HDL 42 03/02/2015 0814   CHOLHDL 3.2 03/02/2015 0814   VLDL 14 03/02/2015 0814   LDLCALC 79 03/02/2015 0814     Wt Readings from Last 3 Encounters:  03/07/16 225 lb 9.6 oz (102.3 kg)  11/05/15 221 lb (100.2 kg)  07/28/15 212 lb (96.2 kg)     Other studies Reviewed: Additional studies/ records that were reviewed today include: . Review of the above records demonstrates:   Assessment and Plan:   1. CAD without angina: Mild CAD by cath April 2015. He has no chest pain suggestive of angina. Continue statin, ARB.   2. Non-ischemic Cardiomyopathy: Continue medical therapy with ARB. No beta blocker with reactive airways disease.   3. Chronic systolic CHF: Volume status is ok. Will continue Lasix 40 mg po BID  4. New onset atrial fibrillation: Pt with new finding of atrial fib this am. Rate is 75-80 bpm. CHADS VASC score 2. Will start Xarelto 20 mg daily. Will only start rate control agent if he has issues with elevated heart rate.   Current medicines are reviewed at length with the patient today.  The patient does not have concerns regarding medicines.  The following changes have been made:  no change  Labs/ tests ordered today include:   Orders Placed This Encounter  Procedures  . Basic Metabolic Panel (BMET)  . CBC w/Diff  . EKG 12-Lead     Disposition:   FU with me in 3 months  Signed, Lauree Chandler, MD 03/07/2016 10:39 AM    [facility] [place], [place], [place]  [address] Phone: [phone]; Fax: [phone]

## 2016-03-08 ENCOUNTER — Ambulatory Visit (INDEPENDENT_AMBULATORY_CARE_PROVIDER_SITE_OTHER): Payer: BC Managed Care – PPO

## 2016-03-08 DIAGNOSIS — J4540 Moderate persistent asthma, uncomplicated: Secondary | ICD-10-CM | POA: Diagnosis not present

## 2016-03-08 MED ORDER — OMALIZUMAB 150 MG ~~LOC~~ SOLR
375.0000 mg | SUBCUTANEOUS | Status: DC
  Administered 2016-03-08: 375 mg via SUBCUTANEOUS

## 2016-03-09 NOTE — Progress Notes (Signed)
Pt's Xolair supplied by [facility]. No charge in EPIC needed for medication.  Marvin Chandler,CMA 

## 2016-03-22 ENCOUNTER — Ambulatory Visit: Payer: BC Managed Care – PPO

## 2016-03-30 ENCOUNTER — Ambulatory Visit (INDEPENDENT_AMBULATORY_CARE_PROVIDER_SITE_OTHER): Payer: BC Managed Care – PPO

## 2016-03-30 DIAGNOSIS — J4540 Moderate persistent asthma, uncomplicated: Secondary | ICD-10-CM

## 2016-03-30 MED ORDER — OMALIZUMAB 150 MG ~~LOC~~ SOLR
375.0000 mg | SUBCUTANEOUS | Status: DC
  Administered 2016-03-30: 375 mg via SUBCUTANEOUS

## 2016-04-06 ENCOUNTER — Telehealth: Payer: Self-pay | Admitting: Internal Medicine

## 2016-04-06 NOTE — Telephone Encounter (Signed)
#   vials:6 Ordered date:04/06/16 Shipping Date:04/07/16 once the form is faxed I'll fill it out and fax it back.

## 2016-04-08 ENCOUNTER — Other Ambulatory Visit: Payer: Self-pay | Admitting: Cardiovascular Disease

## 2016-04-08 ENCOUNTER — Telehealth: Payer: Self-pay | Admitting: Internal Medicine

## 2016-04-08 NOTE — Telephone Encounter (Signed)
Error.Stanley A Dalton ° °

## 2016-04-08 NOTE — Telephone Encounter (Signed)
[facility] calling stating that they are needing a new script for med and can't ship until they have it.Marvin Chandler

## 2016-04-08 NOTE — Telephone Encounter (Signed)
Marvin Chandler has faxed forms to [place] and will contact this afternoon for verbal Rx.

## 2016-04-08 NOTE — Telephone Encounter (Signed)
#   vials:6 Ordered date:04/08/16 Shipping Date:04/11/16  The first order was not processed b/c a new rx needed to be called in before the pharm. Would ship. I gave a verbal today once the rx is processed they will process the order.

## 2016-04-12 NOTE — Telephone Encounter (Signed)
#   Vials: 6  Arrival Date: 04/12/16 Lot #: [id number] Exp Date:5/21

## 2016-04-13 ENCOUNTER — Ambulatory Visit: Payer: BC Managed Care – PPO

## 2016-04-14 ENCOUNTER — Ambulatory Visit (INDEPENDENT_AMBULATORY_CARE_PROVIDER_SITE_OTHER): Payer: BC Managed Care – PPO

## 2016-04-14 DIAGNOSIS — J4540 Moderate persistent asthma, uncomplicated: Secondary | ICD-10-CM

## 2016-04-26 MED ORDER — OMALIZUMAB 150 MG ~~LOC~~ SOLR
375.0000 mg | SUBCUTANEOUS | Status: DC
  Administered 2016-04-14: 375 mg via SUBCUTANEOUS

## 2016-04-28 ENCOUNTER — Ambulatory Visit: Payer: BC Managed Care – PPO

## 2016-04-28 DIAGNOSIS — J4540 Moderate persistent asthma, uncomplicated: Secondary | ICD-10-CM

## 2016-04-29 ENCOUNTER — Ambulatory Visit (INDEPENDENT_AMBULATORY_CARE_PROVIDER_SITE_OTHER): Payer: BC Managed Care – PPO

## 2016-04-29 ENCOUNTER — Telehealth: Payer: Self-pay | Admitting: Internal Medicine

## 2016-04-29 DIAGNOSIS — J4540 Moderate persistent asthma, uncomplicated: Secondary | ICD-10-CM | POA: Diagnosis not present

## 2016-05-05 ENCOUNTER — Telehealth: Payer: Self-pay | Admitting: Internal Medicine

## 2016-05-05 NOTE — Telephone Encounter (Signed)
Created in error

## 2016-05-05 NOTE — Telephone Encounter (Signed)
#   vials:6 Ordered date:04/29/16 Shipping Date:05/10/16 forgot to put into epic.

## 2016-05-11 NOTE — Telephone Encounter (Signed)
#   Vials:6  Arrival Date:05/11/16 Lot [id number] Exp Date:6/21

## 2016-05-13 ENCOUNTER — Ambulatory Visit (INDEPENDENT_AMBULATORY_CARE_PROVIDER_SITE_OTHER): Payer: BC Managed Care – PPO

## 2016-05-13 DIAGNOSIS — J4540 Moderate persistent asthma, uncomplicated: Secondary | ICD-10-CM

## 2016-05-13 MED ORDER — OMALIZUMAB 150 MG ~~LOC~~ SOLR
375.0000 mg | SUBCUTANEOUS | Status: DC
  Administered 2016-05-13: 375 mg via SUBCUTANEOUS

## 2016-05-27 ENCOUNTER — Ambulatory Visit (INDEPENDENT_AMBULATORY_CARE_PROVIDER_SITE_OTHER): Payer: BC Managed Care – PPO

## 2016-05-27 DIAGNOSIS — J4520 Mild intermittent asthma, uncomplicated: Secondary | ICD-10-CM

## 2016-05-27 MED ORDER — OMALIZUMAB 150 MG ~~LOC~~ SOLR
375.0000 mg | SUBCUTANEOUS | Status: DC
  Administered 2016-05-27: 375 mg via SUBCUTANEOUS

## 2016-05-27 NOTE — Progress Notes (Signed)
Documentation and charges have been completed by Melady Chow, CMA based on the Xolair documentation sheet completed by Tammy Scott.  

## 2016-05-31 ENCOUNTER — Ambulatory Visit: Payer: BC Managed Care – PPO | Admitting: Internal Medicine

## 2016-06-06 ENCOUNTER — Telehealth: Payer: Self-pay | Admitting: Internal Medicine

## 2016-06-06 NOTE — Telephone Encounter (Signed)
#   vials:6 Ordered date:06/06/16 Shipping Date:06/07/16

## 2016-06-07 ENCOUNTER — Ambulatory Visit (INDEPENDENT_AMBULATORY_CARE_PROVIDER_SITE_OTHER): Payer: BC Managed Care – PPO | Admitting: Internal Medicine

## 2016-06-07 ENCOUNTER — Encounter: Payer: Self-pay | Admitting: Internal Medicine

## 2016-06-07 VITALS — BP 112/60 | HR 71 | Ht 70.0 in | Wt 221.0 lb

## 2016-06-07 DIAGNOSIS — R768 Other specified abnormal immunological findings in serum: Secondary | ICD-10-CM | POA: Diagnosis not present

## 2016-06-07 DIAGNOSIS — J4540 Moderate persistent asthma, uncomplicated: Secondary | ICD-10-CM

## 2016-06-07 NOTE — Patient Instructions (Signed)
ICD-9-CM ICD-10-CM   1. Moderate persistent asthma without complication 123456 [id number]   2. Elevated IgE level 795.79 R76.8     Well controlled based on symptoms and exam New scratchy throat - unclear why  Plan - continue xolair - continue spiriva, breo, singulair - use albuterol as needed - keep an eye on scratchy throat - if worse call us [phone] and we can look at asthma flare up treatment  Followup  6 months or sooner if needed; ACQ and FeNO at followup  -if doing well at followup can consider stopping spiriva

## 2016-06-07 NOTE — Progress Notes (Signed)
Subjective:     Patient ID: Marvin Chandler, male   DOB: 23-Jul-1950, 66 y.o.   MRN: [id number]  HPI  66 yo male former smoker with Asthma   TEST  PFT 08/2014  (post BD )  FEV1 at 75%, ratio 63,10% BD response FVC 91%, diffusing capacity 82% Mid flows were decreased with a significant bronchodilator response FENTO 08/2014  66  IgE >500, ++RAST  Has nonischemic CM with EF 45%.   07/28/2015 Acute OV : Asthma  Pt presents for an acute office visit.  Complains of 1 week of  prod cough with yellow mucus, chest congestion/discomfort, wheezing and SOB x 1 week. Fever late last week. Denies any nausea or vomiting.  Family is sick with similar symptoms.  Appetite is god. No increased leg sweling, no orthopnea, chest pain.  Remains on Singulair , Spiriva , BREO .  Taking cold meds otc.     OV 11/05/2015  Chief Complaint  Patient presents with  . Follow-up    coughing up green mucus.  no chest tightness, no other concerns.    Moderate persistent asthma with high IgE but normal eosinophils on Spiriva, Brio, Singulair and on Xolair since early 2017. April 2016 post bronchodilator FEV1 2.33 L/75% which is a 10% bronchial dilator responsiveness and a ratio of 63.  There is a follow-up since his last visit with Dr. Annamaria Boots in January 2017. He says the Xolair has made a big difference for him. His asthma is no longer problematic. He does not wake up in the middle of the night symptoms. He does not use albuterol for rescue. He has not had asthma exacerbations. He is able to do his physical work without much problems. He does have mild amount of green baseline mucus when he coughs but this is baseline and does not bother him. This no chest tightness or wheeze. He is very happy with his current regimen.  Exhaled nitric oxide today 11/05/2015:     OV 06/07/2016  Chief Complaint  Patient presents with  . Follow-up    Pt states his breathing overall is doing well, however recently pt has had a flare. Pt  states he feels worse when he runs out of his inhalers his breathing worsens. Pt c/o prod cough with green mucus and increase in SOB and occ chest tightness. Pt denies f/c/s.     [place]: Moderate persistent asthma with high IgE but normal eosinophils on Spiriva, Brio, Singulair and on Xolair since early 2017. April 2016 post bronchodilator FEV1 2.33 L/75% which is a 10% bronchial dilator responsiveness and a ratio of 63.   S: Moderate persistent asthmatic with elevated IgE on Xolair therapy and on triple inhaler therapy along with Singulair. Overall stable. He says that today in the morning he developed a scratchy throat with this no associated worsening of cough or wheezing or chest tightness. He says he been compliant with all his medications. He is up-to-date with his flu shot. This no fever or chills or flulike symptoms.   has a past medical history of Arthritis of hip; Asthma; CAD (coronary artery disease); Chronic systolic CHF (congestive heart failure) ([place]); Cough; Hiatal hernia; NICM (nonischemic cardiomyopathy) ([place]); Osteoarthritis; Personal history of colonic adenomas (02/20/2013); and Pneumonia.   reports that he quit smoking about 18 years ago. His smoking use included Cigarettes. He has a 7.00 pack-year smoking history. He has never used smokeless tobacco.  Past Surgical History:  Procedure Laterality Date  . COLONOSCOPY  02/13/13  . LEFT  AND RIGHT HEART CATHETERIZATION WITH CORONARY ANGIOGRAM N/A 08/18/2014   Procedure: LEFT AND RIGHT HEART CATHETERIZATION WITH CORONARY ANGIOGRAM;  Surgeon: [name], MD;  Location[facility] CATH LAB;  Service: Cardiovascular;  Laterality: N/A;  . TOTAL HIP ARTHROPLASTY  2002   bilateral    No Known Allergies  Immunization History  Administered Date(s) Administered  . Influenza, High Dose Seasonal PF 02/09/2016  . Influenza,inj,Quad PF,36+ Mos 07/17/2014, 01/21/2015  . Pneumococcal Polysaccharide-23 07/17/2014  . Tdap 12/31/2013     Family History  Problem Relation Age of Onset  . Dementia Mother   . Colon cancer Neg Hx   . Esophageal cancer Neg Hx   . Cancer    . Heart attack Neg Hx   . Stroke Cousin   . Cancer Maternal Aunt   . Hypertension Neg Hx   . Diabetes Cousin      Current Outpatient Prescriptions:  .  albuterol (PROVENTIL) (2.5 MG/3ML) 0.083% nebulizer solution, Take 3 mLs (2.5 mg total) by nebulization every 6 (six) hours as needed for wheezing or shortness of breath., Disp: 300 mL, Rfl: 5 .  BREO ELLIPTA 200-25 MCG/INH AEPB, INHALE 1 PUFF BY MOUTH DAILY, Disp: 1 each, Rfl: 5 .  EPINEPHrine 0.3 mg/0.3 mL IJ SOAJ injection, Inject 0.3 mLs (0.3 mg total) into the muscle once., Disp: 1 Device, Rfl: 11 .  furosemide (LASIX) 40 MG tablet, TAKE 1 TABLET BY MOUTH TWICE DAILY, Disp: 60 tablet, Rfl: 11 .  HYDROcodone-acetaminophen (NORCO/VICODIN) 5-325 MG per tablet, Take 1 tablet by mouth every 6 (six) hours as needed for moderate pain., Disp: 65 tablet, Rfl: 0 .  losartan (COZAAR) 50 MG tablet, TAKE 1 TABLET BY MOUTH DAILY, Disp: 30 tablet, Rfl: 11 .  montelukast (SINGULAIR) 10 MG tablet, TAKE 1 TABLET BY MOUTH AT BEDTIME, Disp: 30 tablet, Rfl: 5 .  Nebulizers MISC, by Does not apply route 4 (four) times daily. Use with nebulizer four times a day as needed for wheezing or sob, Disp: , Rfl:  .  nitroGLYCERIN (NITROSTAT) 0.4 MG SL tablet, Place 1 tablet (0.4 mg total) under the tongue every 5 (five) minutes as needed., Disp: 25 tablet, Rfl: 3 .  omalizumab (XOLAIR) 150 MG injection, Inject 150 mg into the skin every 14 (fourteen) days., Disp: , Rfl:  .  omeprazole (PRILOSEC) 40 MG capsule, Take 1 capsule by mouth every morning on an empty stomach. Take 30 minutes before eating., Disp: 30 capsule, Rfl: 3 .  pravastatin (PRAVACHOL) 40 MG tablet, TAKE 1 TABLET BY MOUTH EVERY EVENING, Disp: 15 tablet, Rfl: 6 .  rivaroxaban (XARELTO) 20 MG TABS tablet, Take 1 tablet (20 mg total) by mouth daily with supper., Disp:  30 tablet, Rfl: 6 .  sildenafil (VIAGRA) 50 MG tablet, Take 1 tablet (50 mg total) by mouth daily as needed for erectile dysfunction., Disp: 6 tablet, Rfl: 5 .  SPIRIVA HANDIHALER 18 MCG inhalation capsule, INHALE THE CONTENTS OF 1 CAPSULE VIA HANDIHALER BY MOUTH DAILY, Disp: 30 capsule, Rfl: 3 .  VENTOLIN HFA 108 (90 Base) MCG/ACT inhaler, INHALE 2 PUFFS BY MOUTH EVERY 4 HOURS AS NEEDED, Disp: 18 g, Rfl: 2 .  fluticasone (FLONASE) 50 MCG/ACT nasal spray, Place 2 sprays into both nostrils daily. (Patient not taking: Reported on 06/07/2016), Disp: 16 g, Rfl: 2  Current Facility-Administered Medications:  .  omalizumab (XOLAIR) injection 375 mg, 375 mg, Subcutaneous, Q14 Days, Deneise Lever, MD, 375 mg at 02/19/16 1420 .  omalizumab Arvid Right) injection 375 mg,  375 mg, Subcutaneous, Q14 Days, Deneise Lever, MD, 375 mg at 03/08/16 1220 .  omalizumab Arvid Right) injection 375 mg, 375 mg, Subcutaneous, Q14 Days, Deneise Lever, MD, 375 mg at 03/30/16 1217 .  omalizumab Arvid Right) injection 375 mg, 375 mg, Subcutaneous, Q14 Days, Deneise Lever, MD, 375 mg at 04/14/16 1116 .  omalizumab Arvid Right) injection 375 mg, 375 mg, Subcutaneous, Q14 Days, Deneise Lever, MD, 375 mg at 05/13/16 1517 .  omalizumab Arvid Right) injection 375 mg, 375 mg, Subcutaneous, Q14 Days, Deneise Lever, MD, 375 mg at 05/27/16 1626   Review of Systems     Objective:   Physical Exam  Constitutional: He is oriented to person, place, and time. He appears well-developed and well-nourished. No distress.  HENT:  Head: Normocephalic and atraumatic.  Right Ear: External ear normal.  Left Ear: External ear normal.  Mouth/Throat: Oropharynx is clear and moist. No oropharyngeal exudate.  Eyes: Conjunctivae and EOM are normal. Pupils are equal, round, and reactive to light. Right eye exhibits no discharge. Left eye exhibits no discharge. No scleral icterus.  Neck: Normal range of motion. Neck supple. No JVD present. No tracheal deviation  present. No thyromegaly present.  Cardiovascular: Normal rate, regular rhythm and intact distal pulses.  Exam reveals no gallop and no friction rub.   No murmur heard. Pulmonary/Chest: Effort normal and breath sounds normal. No respiratory distress. He has no wheezes. He has no rales. He exhibits no tenderness.  Abdominal: Soft. Bowel sounds are normal. He exhibits no distension and no mass. There is no tenderness. There is no rebound and no guarding.  Musculoskeletal: Normal range of motion. He exhibits no edema or tenderness.  Lymphadenopathy:    He has no cervical adenopathy.  Neurological: He is alert and oriented to person, place, and time. He has normal reflexes. No cranial nerve deficit. Coordination normal.  Skin: Skin is warm and dry. No rash noted. He is not diaphoretic. No erythema. No pallor.  Psychiatric: He has a normal mood and affect. His behavior is normal. Judgment and thought content normal.  Nursing note and vitals reviewed.  Vitals:   06/07/16 1213  BP: 112/60  Pulse: 71  SpO2: 97%  Weight: 221 lb (100.2 kg)  Height: 5\' 10"  (1.778 m)    Estimated body mass index is 31.71 kg/m as calculated from the following:   Height as of this encounter: 5\' 10"  (1.778 m).   Weight as of this encounter: 221 lb (100.2 kg).     Assessment:       ICD-9-CM ICD-10-CM   1. Moderate persistent asthma without complication 123456 123456   2. Elevated IgE level 795.79 R76.8        Plan:      Well controlled based on symptoms and exam New scratchy throat - unclear why  Plan - continue xolair - continue spiriva, breo, singulair - use albuterol as needed - keep an eye on scratchy throat - if worse call us [phone] and we can look at asthma flare up treatment  Followup  6 months or sooner if needed; ACQ and FeNO at followup  -if doing well at followup can consider stopping spiriva   Dr. Brand Males, M.D., [facility].C.P Pulmonary and Critical Care Medicine Staff  Physician [place] [facility] Pager: [phone], If no answer or between  15:00h - 7:00h: call [phone]  06/07/2016 12:52 PM

## 2016-06-08 ENCOUNTER — Other Ambulatory Visit: Payer: Self-pay | Admitting: Internal Medicine

## 2016-06-08 NOTE — Telephone Encounter (Signed)
#   Vials:6 Arrival Date:06/08/16 Lot [id number] Exp Date:8/21

## 2016-06-10 ENCOUNTER — Ambulatory Visit (INDEPENDENT_AMBULATORY_CARE_PROVIDER_SITE_OTHER): Payer: BC Managed Care – PPO

## 2016-06-10 ENCOUNTER — Telehealth: Payer: Self-pay | Admitting: Internal Medicine

## 2016-06-10 DIAGNOSIS — J4520 Mild intermittent asthma, uncomplicated: Secondary | ICD-10-CM

## 2016-06-10 NOTE — Telephone Encounter (Signed)
   Please take prednisone 40 mg x1 day, then 30 mg x1 day, then 20 mg x1 day, then 10 mg x1 day, and then 5 mg x1 day and stop   Dr. Brand Males, M.D., [facility].C.P Pulmonary and Critical Care Medicine Staff Physician [place] [facility] Pager: [phone], If no answer or between  15:00h - 7:00h: call [phone]  06/10/2016 12:09 PM

## 2016-06-10 NOTE — Telephone Encounter (Signed)
Spoke with pt. He came in today for his Xolair injection. States that he is feeling worse since seeing MR on 06/07/16. Reports increased coughing and wheezing. Cough is producing clear to milky mucus. Denies chest tightness, SOB or fever. Pt would like MR's recommendations.  MR - please advise. Thanks.

## 2016-06-10 NOTE — Telephone Encounter (Signed)
lmtcb for pt.  

## 2016-06-13 MED ORDER — PREDNISONE 5 MG PO TABS: ORAL_TABLET | ORAL | 0 refills | Status: DC

## 2016-06-13 MED ORDER — ALBUTEROL SULFATE HFA 108 (90 BASE) MCG/ACT IN AERS: 2.0000 | INHALATION_SPRAY | RESPIRATORY_TRACT | 6 refills | Status: AC | PRN

## 2016-06-13 NOTE — Telephone Encounter (Signed)
Called and spoke with pt and he is aware of MR recs. Nothing further is needed.  

## 2016-06-14 MED ORDER — OMALIZUMAB 150 MG ~~LOC~~ SOLR
375.0000 mg | SUBCUTANEOUS | Status: DC
Start: 2016-06-10 — End: 2016-07-07
  Administered 2016-06-10: 375 mg via SUBCUTANEOUS

## 2016-06-14 NOTE — Progress Notes (Signed)
Documentation of medication administration of Xolair and charges have been completed by Arianah Torgeson, CMA based on the Xolair documentation sheet completed by Tammy Scott.  

## 2016-06-21 NOTE — Progress Notes (Signed)
Chief Complaint  Patient presents with  . Shortness of Breath    WITH EXERTION. NO CP OR SWELLING  . Coronary Artery Disease  . Congestive Heart Failure    History of Present Illness: 66 yo AAM with history of CAD, non-ischemic cardiomyopathy, asthma, hiatal hernia who is here today for follow up. I saw him August 2013 as a new patient for evaluation of chest pain and an abnormal EKG. I arranged an exercise stress myoview that showed no ischemia. His LVEF was calculated at 43% but appeared better than that. Echo August 2013 with normal LVEF 50-55%, mild LVH. He was admitted March 2015 with acute hypoxic respiratory failure in the setting of acute asthma exacerbation and possible pneumonia. There was some concern for vascular congestion on CXR as well. He was treated with IV Lasix as well as inhalers, prednisone and antibiotics. He had some pleuritic chest pain. CT was neg for PE. He did have elevated CEs with a flat trend. This was thought to be demand ischemia. He was not seen by Cardiology. Echo demonstrated worsening LVEF with EF 45%. He was seen by Richardson Dopp, April 2015 and he noted occasional episodes of sharp chest pain and dyspnea with minimal activity. Cardiac cath April 2015 demonstrated very mild non-obstructive CAD and mild elevated filling pressures. He was felt to have a non-ischemic cardiomyopathy. Lasix was adjusted and he was placed on Losartan. I saw him in the office 03/07/16 and he was in atrial fibrillation. He was started on Xarelto. His heart rate was not elevated so no rate control agent was started.   He is feeling well. No chest pain or SOB. Occasional LE edema. No palpitations. NO bleeding.   Primary Care Physician: Ruben Reason, MD   Past Medical History:  Diagnosis Date  . Arthritis of hip    bilateral  . Asthma   . CAD (coronary artery disease)    LHC 5/16:  Dx ostial 30%, LAD luminal irregs, EF 40%  . Chronic systolic CHF (congestive heart failure)  ([place])    a. Echo 3/16:  inf and inf-lat HK, mild LVH, EF 45%, mild to mod LAE, normal RVF  . Cough   . Hiatal hernia   . NICM (nonischemic cardiomyopathy) ([place])   . Osteoarthritis   . Personal history of colonic adenomas 02/20/2013  . Pneumonia     Past Surgical History:  Procedure Laterality Date  . COLONOSCOPY  02/13/13  . LEFT AND RIGHT HEART CATHETERIZATION WITH CORONARY ANGIOGRAM N/A 08/18/2014   Procedure: LEFT AND RIGHT HEART CATHETERIZATION WITH CORONARY ANGIOGRAM;  Surgeon: [name], MD;  Location: [facility] CATH LAB;  Service: Cardiovascular;  Laterality: N/A;  . TOTAL HIP ARTHROPLASTY  2002   bilateral    Current Outpatient Prescriptions  Medication Sig Dispense Refill  . albuterol (PROVENTIL) (2.5 MG/3ML) 0.083% nebulizer solution Take 3 mLs (2.5 mg total) by nebulization every 6 (six) hours as needed for wheezing or shortness of breath. 300 mL 5  . albuterol (VENTOLIN HFA) 108 (90 Base) MCG/ACT inhaler Inhale 2 puffs into the lungs every 4 (four) hours as needed. 18 g 6  . BREO ELLIPTA 200-25 MCG/INH AEPB INHALE 1 PUFF BY MOUTH DAILY 1 each 5  . EPINEPHrine 0.3 mg/0.3 mL IJ SOAJ injection Inject 0.3 mLs (0.3 mg total) into the muscle once. 1 Device 11  . fluticasone (FLONASE) 50 MCG/ACT nasal spray Place 2 sprays into both nostrils daily. 16 g 2  . furosemide (LASIX) 40 MG tablet TAKE  1 TABLET BY MOUTH TWICE DAILY 60 tablet 11  . HYDROcodone-acetaminophen (NORCO/VICODIN) 5-325 MG per tablet Take 1 tablet by mouth every 6 (six) hours as needed for moderate pain. 65 tablet 0  . losartan (COZAAR) 50 MG tablet TAKE 1 TABLET BY MOUTH DAILY 30 tablet 11  . montelukast (SINGULAIR) 10 MG tablet TAKE 1 TABLET BY MOUTH AT BEDTIME 30 tablet 5  . Nebulizers MISC by Does not apply route 4 (four) times daily. Use with nebulizer four times a day as needed for wheezing or sob    . nitroGLYCERIN (NITROSTAT) 0.4 MG SL tablet Place 1 tablet (0.4 mg total) under the tongue every 5 (five)  minutes as needed. 25 tablet 3  . omalizumab (XOLAIR) 150 MG injection Inject 150 mg into the skin every 14 (fourteen) days.    Marland Kitchen omeprazole (PRILOSEC) 40 MG capsule Take 1 capsule by mouth every morning on an empty stomach. Take 30 minutes before eating. 30 capsule 3  . pravastatin (PRAVACHOL) 40 MG tablet TAKE 1 TABLET BY MOUTH EVERY EVENING 15 tablet 6  . rivaroxaban (XARELTO) 20 MG TABS tablet Take 1 tablet (20 mg total) by mouth daily with supper. 30 tablet 6  . sildenafil (VIAGRA) 50 MG tablet Take 1 tablet (50 mg total) by mouth daily as needed for erectile dysfunction. 6 tablet 5  . SPIRIVA HANDIHALER 18 MCG inhalation capsule INHALE THE CONTENTS OF 1 CAPSULE VIA HANDIHALER BY MOUTH DAILY 30 capsule 3   Current Facility-Administered Medications  Medication Dose Route Frequency Provider Last Rate Last Dose  . omalizumab Arvid Right) injection 375 mg  375 mg Subcutaneous Q14 Days Deneise Lever, MD   375 mg at 02/19/16 1420  . omalizumab Arvid Right) injection 375 mg  375 mg Subcutaneous Q14 Days Deneise Lever, MD   375 mg at 03/08/16 1220  . omalizumab Arvid Right) injection 375 mg  375 mg Subcutaneous Q14 Days Deneise Lever, MD   375 mg at 03/30/16 1217  . omalizumab Arvid Right) injection 375 mg  375 mg Subcutaneous Q14 Days Deneise Lever, MD   375 mg at 04/14/16 1116  . omalizumab Arvid Right) injection 375 mg  375 mg Subcutaneous Q14 Days Deneise Lever, MD   375 mg at 05/13/16 1517  . omalizumab Arvid Right) injection 375 mg  375 mg Subcutaneous Q14 Days Deneise Lever, MD   375 mg at 05/27/16 1626  . omalizumab Arvid Right) injection 375 mg  375 mg Subcutaneous Q14 Days Brand Males, MD   375 mg at 06/10/16 1003    No Known Allergies  Social History   Social History  . Marital status: Legally Separated    Spouse name: N/A  . Number of children: 2  . 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  Smoking status: Former Smoker    Packs/day: 1.00    Years: 7.00    Types: Cigarettes    Quit date: 11/05/1997  . Smokeless tobacco: Never Used  . Alcohol use No  . Drug use: No  . Sexual activity: Not on file   Other Topics Concern  . Not on file   Social History Narrative  . No narrative on file    Family History  Problem Relation Age of Onset  . Dementia Mother   . Cancer    . Stroke Cousin   . Cancer Maternal Aunt   . Diabetes Cousin   .  Colon cancer Neg Hx   . Esophageal cancer Neg Hx   . Heart attack Neg Hx   . Hypertension Neg Hx     Review of Systems:  As stated in the HPI and otherwise negative.   BP (!) 130/100 (BP Location: Right Arm, Patient Position: Sitting, Cuff Size: Large)   Pulse 84   Ht 5\' 11"  (1.803 m)   Wt 228 lb 3.2 oz (103.5 kg)   SpO2 96%   BMI 31.83 kg/m   Physical Examination: General: Well developed, well nourished, NAD  HEENT: OP clear, mucus membranes moist  SKIN: warm, dry. No rashes. Neuro: No focal deficits  Musculoskeletal: Muscle strength 5/5 all ext  Psychiatric: Mood and affect normal  Neck: No JVD, no carotid bruits, no thyromegaly, no lymphadenopathy.  Lungs:Clear bilaterally, no wheezes, rhonci, crackles Cardiovascular: Irreg irreg No murmurs, gallops or rubs. Abdomen:Soft. Bowel sounds present. Non-tender.  Extremities: No lower extremity edema. Pulses are 2 + in the bilateral DP/PT.  EKG:  EKG is not ordered today. The ekg ordered today demonstrates   Recent Labs: 03/07/2016: BUN 16; Creat 1.22; Hemoglobin 14.0; Platelets 232; Potassium 3.7; Sodium 141   Lipid Panel    Component Value Date/Time   CHOL 135 03/02/2015 0814   TRIG 68 03/02/2015 0814   HDL 42 03/02/2015 0814   CHOLHDL 3.2 03/02/2015 0814   VLDL 14 03/02/2015 0814   LDLCALC 79 03/02/2015 0814     Wt Readings from Last 3 Encounters:  06/22/16 228 lb 3.2 oz (103.5 kg)  06/07/16 221 lb (100.2 kg)  03/07/16 225 lb 9.6 oz (102.3 kg)     Other  studies Reviewed: Additional studies/ records that were reviewed today include: . Review of the above records demonstrates:   Assessment and Plan:   1. CAD without angina: Mild CAD by cath April 2015. He has no chest pain suggestive of angina. Continue statin, ARB.   2. Non-ischemic Cardiomyopathy: Continue medical therapy with ARB. No beta blocker with reactive airways disease.   3. Chronic systolic CHF: Volume status is ok. Will continue Lasix 40 mg po BID  4. Atrial fibrillation, paroxysmal: Pt with new finding of atrial fib in November 2017. CHADS VASC score 2. He is in sinus today. Will continue Xarelto 20 mg daily. Will only start rate control agent if he has issues with elevated heart rate.   Current medicines are reviewed at length with the patient today.  The patient does not have concerns regarding medicines.  The following changes have been made:  no change  Labs/ tests ordered today include:   No orders of the defined types were placed in this encounter.   Disposition:   FU with me in 6 months  Signed, Lauree Chandler, MD 06/22/2016 10:36 AM    [facility] [place], [place], [place]  [address] Phone: [phone]; Fax: [phone]

## 2016-06-22 ENCOUNTER — Encounter: Payer: Self-pay | Admitting: Cardiovascular Disease

## 2016-06-22 ENCOUNTER — Ambulatory Visit (INDEPENDENT_AMBULATORY_CARE_PROVIDER_SITE_OTHER): Payer: BC Managed Care – PPO | Admitting: Cardiovascular Disease

## 2016-06-22 VITALS — BP 130/100 | HR 84 | Ht 71.0 in | Wt 228.2 lb

## 2016-06-22 DIAGNOSIS — I4891 Unspecified atrial fibrillation: Secondary | ICD-10-CM

## 2016-06-22 DIAGNOSIS — I2510 Atherosclerotic heart disease of native coronary artery without angina pectoris: Secondary | ICD-10-CM | POA: Diagnosis not present

## 2016-06-22 DIAGNOSIS — I5022 Chronic systolic (congestive) heart failure: Secondary | ICD-10-CM | POA: Diagnosis not present

## 2016-06-22 DIAGNOSIS — I428 Other cardiomyopathies: Secondary | ICD-10-CM | POA: Diagnosis not present

## 2016-06-22 MED ORDER — FUROSEMIDE 40 MG PO TABS: 40.0000 mg | ORAL_TABLET | Freq: Two times a day (BID) | ORAL | 11 refills | Status: DC

## 2016-06-22 MED ORDER — LOSARTAN POTASSIUM 50 MG PO TABS
50.0000 mg | ORAL_TABLET | Freq: Every day | ORAL | 11 refills | Status: DC
Start: 2016-06-22 — End: 2019-07-31

## 2016-06-22 MED ORDER — PRAVASTATIN SODIUM 40 MG PO TABS: 40.0000 mg | ORAL_TABLET | Freq: Every evening | ORAL | 11 refills | Status: DC

## 2016-06-22 MED ORDER — RIVAROXABAN 20 MG PO TABS: 20.0000 mg | ORAL_TABLET | Freq: Every day | ORAL | 6 refills | Status: AC

## 2016-06-22 NOTE — Patient Instructions (Signed)
Medication Instructions:  Your physician recommends that you continue on your current medications as directed. Please refer to the Current Medication list given to you today.   Labwork: Your physician recommends that you return for lab work on 06/27/16.  This will be fasting.  The lab opens at 7:30 AM   Testing/Procedures: none  Follow-Up: Your physician recommends that you schedule a follow-up appointment in: 6 months.  Please call our office in about 3 months to schedule this appointment.     Any Other Special Instructions Will Be Listed Below (If Applicable).     If you need a refill on your cardiac medications before your next appointment, please call your pharmacy.

## 2016-06-23 ENCOUNTER — Other Ambulatory Visit: Payer: BC Managed Care – PPO | Admitting: *Deleted

## 2016-06-23 DIAGNOSIS — I4891 Unspecified atrial fibrillation: Secondary | ICD-10-CM

## 2016-06-23 DIAGNOSIS — I2510 Atherosclerotic heart disease of native coronary artery without angina pectoris: Secondary | ICD-10-CM

## 2016-06-24 ENCOUNTER — Other Ambulatory Visit: Payer: Self-pay | Admitting: *Deleted

## 2016-06-24 DIAGNOSIS — E785 Hyperlipidemia, unspecified: Secondary | ICD-10-CM

## 2016-06-24 LAB — CBC WITH DIFFERENTIAL/PLATELET
BASOS: 0 %
Basophils Absolute: 0 10*3/uL (ref 0.0–0.2)
EOS (ABSOLUTE): 0.1 10*3/uL (ref 0.0–0.4)
EOS: 1 %
Hematocrit: 40.2 % (ref 37.5–51.0)
Hemoglobin: 13.6 g/dL (ref 13.0–17.7)
IMMATURE GRANS (ABS): 0 10*3/uL (ref 0.0–0.1)
IMMATURE GRANULOCYTES: 0 %
LYMPHS: 30 %
Lymphocytes Absolute: 2.4 10*3/uL (ref 0.7–3.1)
MCH: 29.2 pg (ref 26.6–33.0)
MCHC: 33.8 g/dL (ref 31.5–35.7)
MCV: 86 fL (ref 79–97)
MONOCYTES: 8 %
Monocytes Absolute: 0.6 10*3/uL (ref 0.1–0.9)
NEUTROS ABS: 4.8 10*3/uL (ref 1.4–7.0)
NEUTROS PCT: 61 %
Platelets: 244 10*3/uL (ref 150–379)
RBC: 4.66 x10E6/uL (ref 4.14–5.80)
RDW: 14.2 % (ref 12.3–15.4)
WBC: 8 10*3/uL (ref 3.4–10.8)

## 2016-06-24 LAB — COMPREHENSIVE METABOLIC PANEL
A/G RATIO: 1.5 (ref 1.2–2.2)
ALBUMIN: 3.9 g/dL (ref 3.6–4.8)
ALT: 37 IU/L (ref 0–44)
AST: 21 IU/L (ref 0–40)
Alkaline Phosphatase: 71 IU/L (ref 39–117)
BILIRUBIN TOTAL: 0.6 mg/dL (ref 0.0–1.2)
BUN / CREAT RATIO: 14 (ref 10–24)
BUN: 16 mg/dL (ref 8–27)
CALCIUM: 9.8 mg/dL (ref 8.6–10.2)
CHLORIDE: 99 mmol/L (ref 96–106)
CO2: 27 mmol/L (ref 18–29)
Creatinine, Ser: 1.14 mg/dL (ref 0.76–1.27)
GFR, EST AFRICAN AMERICAN: 77 (ref >59)
GFR, EST NON AFRICAN AMERICAN: 66 (ref >59)
Globulin, Total: 2.6 (ref 1.5–4.5)
Glucose: 114 mg/dL — ABNORMAL HIGH (ref 65–99)
POTASSIUM: 4.3 mmol/L (ref 3.5–5.2)
Sodium: 143 mmol/L (ref 134–144)
TOTAL PROTEIN: 6.5 g/dL (ref 6.0–8.5)

## 2016-06-24 LAB — LIPID PANEL
CHOL/HDL RATIO: 4.1 (ref 0.0–5.0)
Cholesterol, Total: 152 mg/dL (ref 100–199)
HDL: 37 mg/dL — AB (ref >39)
LDL Calculated: 89 (ref 0–99)
Triglycerides: 130 mg/dL (ref 0–149)
VLDL CHOLESTEROL CAL: 26 (ref 5–40)

## 2016-06-24 MED ORDER — PRAVASTATIN SODIUM 80 MG PO TABS: 80.0000 mg | ORAL_TABLET | Freq: Every evening | ORAL | 11 refills | Status: DC

## 2016-06-27 ENCOUNTER — Telehealth: Payer: Self-pay | Admitting: Internal Medicine

## 2016-06-27 ENCOUNTER — Ambulatory Visit (INDEPENDENT_AMBULATORY_CARE_PROVIDER_SITE_OTHER): Payer: BC Managed Care – PPO

## 2016-06-27 ENCOUNTER — Other Ambulatory Visit: Payer: BC Managed Care – PPO

## 2016-06-27 DIAGNOSIS — J4540 Moderate persistent asthma, uncomplicated: Secondary | ICD-10-CM | POA: Diagnosis not present

## 2016-06-27 NOTE — Telephone Encounter (Signed)
Called [employer]. Advised them that we have plenty of sterile water for Xolair injection. Nothing further was needed.

## 2016-06-28 ENCOUNTER — Telehealth: Payer: Self-pay | Admitting: Internal Medicine

## 2016-06-28 MED ORDER — OMALIZUMAB 150 MG ~~LOC~~ SOLR
375.0000 mg | SUBCUTANEOUS | Status: DC
  Administered 2016-06-27: 375 mg via SUBCUTANEOUS

## 2016-06-28 NOTE — Progress Notes (Signed)
Xolair injection documentation and charges entered by Ousman Dise, RMA, based on injection sheet filled out by Tammy Scott.   

## 2016-06-28 NOTE — Telephone Encounter (Signed)
#   vials:2  Ordered date:06/28/16 Shipping Date:07/05/16

## 2016-07-06 ENCOUNTER — Telehealth: Payer: Self-pay | Admitting: Internal Medicine

## 2016-07-06 NOTE — Telephone Encounter (Signed)
Will route to Tammy Scott to follow up on.  

## 2016-07-06 NOTE — Telephone Encounter (Signed)
Sabrina from [facility] is calling stating that Xolair needs PA.  CB is [phone].

## 2016-07-06 NOTE — Telephone Encounter (Signed)
#   Vials:6 Arrival Date:07/06/16 Lot #:[id number] Exp Date:8/21

## 2016-07-06 NOTE — Telephone Encounter (Signed)
[facility] Fax a P/A form this morning. I filled it out, had CY sign it and sent it to [facility] P[facility]pt. This afternoon. [facility] called saying [facility] needed a P[facility]A. Returned their call to let them know I just sent the form to them. (10 or 15 mins. Ago.) Rep. Made a note in their comp.. Waiting to hear from [facility]Marland Kitchen

## 2016-07-07 MED ORDER — OMALIZUMAB 150 MG ~~LOC~~ SOLR
375.0000 mg | Freq: Once | SUBCUTANEOUS | Status: AC
  Administered 2016-04-28: 375 mg via SUBCUTANEOUS

## 2016-07-11 ENCOUNTER — Telehealth: Payer: Self-pay | Admitting: Internal Medicine

## 2016-07-11 ENCOUNTER — Ambulatory Visit (INDEPENDENT_AMBULATORY_CARE_PROVIDER_SITE_OTHER): Payer: BC Managed Care – PPO

## 2016-07-11 DIAGNOSIS — J4520 Mild intermittent asthma, uncomplicated: Secondary | ICD-10-CM | POA: Diagnosis not present

## 2016-07-11 MED ORDER — FLUTICASONE PROPIONATE 50 MCG/ACT NA SUSP: 2.0000 | Freq: Every day | NASAL | 5 refills | Status: AC

## 2016-07-11 NOTE — Telephone Encounter (Signed)
Called and spoke to pt. Pt is requesting a refill of flonase, rx sent to preferred pharmacy. Pt verbalized understanding and denied any further questions or concerns at this time.

## 2016-07-12 NOTE — Telephone Encounter (Signed)
In the meantime [employer] sent Marvin Chandler's xolair 07/06/16. (month's supply) Checked with Joellen Jersey said to give him what was here.

## 2016-07-13 MED ORDER — OMALIZUMAB 150 MG ~~LOC~~ SOLR
375.0000 mg | SUBCUTANEOUS | Status: DC
  Administered 2016-07-11 – 2016-07-25 (×2): 375 mg via SUBCUTANEOUS

## 2016-07-13 NOTE — Progress Notes (Signed)
Documentation of medication administration of Xolair and charges have been completed by Tirzah Fross, CMA based on the Xolair documentation sheet completed by Tammy Scott.  

## 2016-07-18 NOTE — Telephone Encounter (Signed)
Called pt. 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 I called him back this morning morning and he was able to write the # down. I sent a p/a but since they couldn't get in touch with Mr.Mullendore they had to close his case. Hopefully we can get the ball rolling again once he calls [facility]. [phone])

## 2016-07-19 NOTE — Telephone Encounter (Signed)
He did talked to [facility] 07/18/16 but they couldn't help him with co-pay assistance. I got a fax from [facility] today. They have tried to  to reach Marvin Chandler about co-pay assistance and failed. I called him back and gave him their # for co-pay assistance. He said he'd call, not sure when that will be.

## 2016-07-25 ENCOUNTER — Ambulatory Visit (INDEPENDENT_AMBULATORY_CARE_PROVIDER_SITE_OTHER): Payer: BC Managed Care – PPO

## 2016-07-25 DIAGNOSIS — J4540 Moderate persistent asthma, uncomplicated: Secondary | ICD-10-CM | POA: Diagnosis not present

## 2016-07-25 DIAGNOSIS — J4520 Mild intermittent asthma, uncomplicated: Secondary | ICD-10-CM | POA: Diagnosis not present

## 2016-07-25 NOTE — Telephone Encounter (Signed)
Pt. Came in for his shot today and told me he's been set up with [facility] again. Nothing further needed.

## 2016-07-28 MED ORDER — OMALIZUMAB 150 MG ~~LOC~~ SOLR: 375.0000 mg | Freq: Once | SUBCUTANEOUS | Status: DC

## 2016-07-29 ENCOUNTER — Other Ambulatory Visit: Payer: Self-pay | Admitting: Internal Medicine

## 2016-08-02 ENCOUNTER — Telehealth: Payer: Self-pay | Admitting: Internal Medicine

## 2016-08-02 NOTE — Telephone Encounter (Signed)
#   vials:6 Ordered date:08/02/16 Shipping Date:08/04/16 I'm not sure if they will be able to get it here by then. I called and faxed what they need from [place], but the rest depends on if the pt. Calls me back and gives me a fax # so he can fax it back and then I can fax it to [facility]. Pt. Has an appt. 08/08/16. (Mon.)

## 2016-08-03 ENCOUNTER — Telehealth: Payer: Self-pay | Admitting: Adult Health

## 2016-08-04 NOTE — Telephone Encounter (Signed)
error 

## 2016-08-04 NOTE — Telephone Encounter (Signed)
Pt. Came in and signed the financial form and I faxed soon after. [employer] sent an approval letter for a yr. (08/04/16-09/03/17) I called and ordered xolair as requested. I had to cancel pt. 08/08/16 appt. And rsc for 08/10/16.  # vials:6 Ordered date:08/04/16 Shipping Date:08/09/16  Will leave encounter open until med. Comes in.

## 2016-08-08 ENCOUNTER — Ambulatory Visit: Payer: BC Managed Care – PPO

## 2016-08-10 ENCOUNTER — Ambulatory Visit: Payer: BC Managed Care – PPO

## 2016-08-10 NOTE — Telephone Encounter (Signed)
#   Vials:6 Arrival Date:08/10/16 Lot #:[id number] Exp Date:8/21

## 2016-08-12 ENCOUNTER — Ambulatory Visit: Payer: BC Managed Care – PPO

## 2016-08-26 ENCOUNTER — Other Ambulatory Visit: Payer: Self-pay | Admitting: *Deleted

## 2016-08-26 DIAGNOSIS — I5022 Chronic systolic (congestive) heart failure: Secondary | ICD-10-CM

## 2016-08-26 MED ORDER — NITROGLYCERIN 0.4 MG SL SUBL: 0.4000 mg | SUBLINGUAL_TABLET | SUBLINGUAL | 3 refills | Status: DC | PRN

## 2016-08-31 ENCOUNTER — Ambulatory Visit (INDEPENDENT_AMBULATORY_CARE_PROVIDER_SITE_OTHER): Payer: BC Managed Care – PPO

## 2016-08-31 DIAGNOSIS — J4540 Moderate persistent asthma, uncomplicated: Secondary | ICD-10-CM

## 2016-09-02 MED ORDER — OMALIZUMAB 150 MG ~~LOC~~ SOLR
375.0000 mg | Freq: Once | SUBCUTANEOUS | Status: AC
  Administered 2016-08-31: 375 mg via SUBCUTANEOUS

## 2016-09-12 ENCOUNTER — Other Ambulatory Visit: Payer: BC Managed Care – PPO | Admitting: *Deleted

## 2016-09-12 DIAGNOSIS — E785 Hyperlipidemia, unspecified: Secondary | ICD-10-CM

## 2016-09-12 LAB — HEPATIC FUNCTION PANEL
ALBUMIN: 4.3 g/dL (ref 3.6–4.8)
ALT: 21 IU/L (ref 0–44)
AST: 19 IU/L (ref 0–40)
Alkaline Phosphatase: 71 IU/L (ref 39–117)
Bilirubin Total: 0.5 mg/dL (ref 0.0–1.2)
Bilirubin, Direct: 0.15 mg/dL (ref 0.00–0.40)
Total Protein: 6.7 g/dL (ref 6.0–8.5)

## 2016-09-12 LAB — LIPID PANEL
CHOL/HDL RATIO: 3.8 ratio (ref 0.0–5.0)
CHOLESTEROL TOTAL: 152 mg/dL (ref 100–199)
HDL: 40 mg/dL (ref >39)
LDL CALC: 86 mg/dL (ref 0–99)
TRIGLYCERIDES: 132 mg/dL (ref 0–149)
VLDL CHOLESTEROL CAL: 26 mg/dL (ref 5–40)

## 2016-09-15 ENCOUNTER — Ambulatory Visit: Payer: BC Managed Care – PPO

## 2016-09-15 ENCOUNTER — Ambulatory Visit (INDEPENDENT_AMBULATORY_CARE_PROVIDER_SITE_OTHER): Payer: BC Managed Care – PPO

## 2016-09-15 DIAGNOSIS — J4540 Moderate persistent asthma, uncomplicated: Secondary | ICD-10-CM | POA: Diagnosis not present

## 2016-09-16 ENCOUNTER — Ambulatory Visit: Payer: BC Managed Care – PPO

## 2016-09-16 MED ORDER — OMALIZUMAB 150 MG ~~LOC~~ SOLR
375.0000 mg | SUBCUTANEOUS | Status: DC
  Administered 2016-09-15: 375 mg via SUBCUTANEOUS

## 2016-09-16 NOTE — Progress Notes (Signed)
Xolair injection documentation and charges entered by Halo Laski, RMA, based on injection sheet filled out by Tammy Scott per office protocol.   

## 2016-09-27 ENCOUNTER — Telehealth: Payer: Self-pay | Admitting: Internal Medicine

## 2016-09-27 NOTE — Telephone Encounter (Addendum)
#   vials:6 Ordered date:09/27/16  Shipping Date:09/29/16 I faxed pt's xolair order, I called and the pharmacy's not going to be able to ship his med. Until 09/30/16. I cancelled his 09/29/16 appt. And rescheduled it for 09/30/16.

## 2016-09-29 ENCOUNTER — Ambulatory Visit: Payer: BC Managed Care – PPO

## 2016-09-30 ENCOUNTER — Ambulatory Visit: Payer: BC Managed Care – PPO

## 2016-09-30 NOTE — Telephone Encounter (Signed)
#   Vials:6 Arrival Date:09/30/16 Lot #:[id number] Exp Date:10/21

## 2016-10-27 ENCOUNTER — Ambulatory Visit (INDEPENDENT_AMBULATORY_CARE_PROVIDER_SITE_OTHER): Payer: BC Managed Care – PPO

## 2016-10-27 DIAGNOSIS — J4520 Mild intermittent asthma, uncomplicated: Secondary | ICD-10-CM | POA: Diagnosis not present

## 2016-10-31 MED ORDER — OMALIZUMAB 150 MG ~~LOC~~ SOLR
375.0000 mg | SUBCUTANEOUS | Status: DC
  Administered 2016-10-27: 375 mg via SUBCUTANEOUS

## 2016-10-31 NOTE — Progress Notes (Signed)
Documentation of medication administration and charges of Xolair have been completed by Lindsay Lemons, CMA based on the Xolair documentation sheet completed by Tammy Scott.  

## 2016-11-10 ENCOUNTER — Ambulatory Visit: Payer: BC Managed Care – PPO

## 2016-11-10 ENCOUNTER — Telehealth: Payer: Self-pay | Admitting: *Deleted

## 2016-11-10 NOTE — Telephone Encounter (Signed)
Pt. Came in to cancel today's appt. but couldn't stay b/c he had to go to a funeral. Pt. Wanted me to call so I did. Pt. Told Shelby that I usually made his appts, that's only because his xolair wasn't here. (or I was talking to him so I just made the appt. to avoid putting him in hold.) Asked pt. To cb and reschedule either today before 4:00, tomorrow or next week. Whatever is convienant for him. Nothing further needed.

## 2016-11-24 ENCOUNTER — Ambulatory Visit (INDEPENDENT_AMBULATORY_CARE_PROVIDER_SITE_OTHER): Payer: BC Managed Care – PPO

## 2016-11-24 DIAGNOSIS — J4540 Moderate persistent asthma, uncomplicated: Secondary | ICD-10-CM

## 2016-11-25 MED ORDER — OMALIZUMAB 150 MG ~~LOC~~ SOLR
375.0000 mg | Freq: Once | SUBCUTANEOUS | Status: AC
Start: 2016-11-24 — End: 2016-11-24
  Administered 2016-11-24: 375 mg via SUBCUTANEOUS

## 2016-11-28 ENCOUNTER — Telehealth: Payer: Self-pay | Admitting: Internal Medicine

## 2016-11-28 NOTE — Telephone Encounter (Signed)
#   vials:6 Ordered date:11/28/16 Shipping Date:11/30/16

## 2016-12-01 NOTE — Telephone Encounter (Signed)
#   Vials:6 Arrival Date:12/01/16 Lot #:[id number] Exp Date:10/21

## 2016-12-02 ENCOUNTER — Encounter: Payer: Self-pay | Admitting: *Deleted

## 2016-12-14 ENCOUNTER — Other Ambulatory Visit: Payer: Self-pay | Admitting: Internal Medicine

## 2016-12-14 MED ORDER — OMALIZUMAB 150 MG ~~LOC~~ SOLR: 375.0000 mg | SUBCUTANEOUS | 6 refills | Status: DC

## 2016-12-14 NOTE — Telephone Encounter (Signed)
Marvin Chandler is going to start getting his xolair medicine from [place] in [place]. We will still be giving him his shots. I'm going to mail the rx, along with the pt.'s enrollement form since they don't have a fax #. Nothing further needeed.

## 2016-12-15 NOTE — Telephone Encounter (Signed)
I'm am sending Marvin Chandler's rx to [facility]. Today. I'm also sending his enrollment form as both were requested. Nothing further needed at this time, closing note.

## 2016-12-21 ENCOUNTER — Encounter: Payer: Self-pay | Admitting: Cardiovascular Disease

## 2016-12-21 ENCOUNTER — Ambulatory Visit (INDEPENDENT_AMBULATORY_CARE_PROVIDER_SITE_OTHER): Payer: Medicare Other | Admitting: Cardiovascular Disease

## 2016-12-21 VITALS — BP 110/66 | HR 78 | Ht 71.0 in | Wt 226.8 lb

## 2016-12-21 DIAGNOSIS — I4891 Unspecified atrial fibrillation: Secondary | ICD-10-CM | POA: Diagnosis not present

## 2016-12-21 DIAGNOSIS — I5022 Chronic systolic (congestive) heart failure: Secondary | ICD-10-CM

## 2016-12-21 DIAGNOSIS — I428 Other cardiomyopathies: Secondary | ICD-10-CM

## 2016-12-21 DIAGNOSIS — I2510 Atherosclerotic heart disease of native coronary artery without angina pectoris: Secondary | ICD-10-CM | POA: Diagnosis not present

## 2016-12-21 NOTE — Patient Instructions (Signed)

## 2016-12-21 NOTE — Progress Notes (Signed)
Chief Complaint  Patient presents with  . Follow-up    CAD    History of Present Illness:  66 yo AAM with history of CAD, non-ischemic cardiomyopathy and atrial fibrillation who is here today for follow up. I saw him August 2013 as a new patient for evaluation of chest pain and an abnormal EKG. Stress test in 2013 with no ischemic. Echo 2013 with with normal LVEF 50-55%, mild LVH.  Echo 2015 in setting of pneumonia demonstrated worsening LVEF with EF 45%.He had sharp chest pains as well. Cardiac cath April 2015 demonstrated very mild non-obstructive CAD and mild elevated filling pressures. He was felt to have a non-ischemic cardiomyopathy. Lasix was adjusted and he was placed on Losartan. I saw him in the office 03/07/16 and he was in atrial fibrillation. He was started on Xarelto. His heart rate was not elevated so no rate control agent was started.   He is here today for follow up. The patient denies any chest pain, palpitations, lower extremity edema, orthopnea, PND, dizziness, near syncope or syncope. He has continued dyspnea but no change.    Primary Care Physician: Posey Boyer, MD   Past Medical History:  Diagnosis Date  . Arthritis of hip    bilateral  . Asthma   . CAD (coronary artery disease)    LHC 5/16:  Dx ostial 30%, LAD luminal irregs, EF 40%  . Chronic systolic CHF (congestive heart failure) ([place])    a. Echo 3/16:  inf and inf-lat HK, mild LVH, EF 45%, mild to mod LAE, normal RVF  . Cough   . Hiatal hernia   . NICM (nonischemic cardiomyopathy) ([place])   . Osteoarthritis   . Personal history of colonic adenomas 02/20/2013  . Pneumonia     Past Surgical History:  Procedure Laterality Date  . COLONOSCOPY  02/13/13  . LEFT AND RIGHT HEART CATHETERIZATION WITH CORONARY ANGIOGRAM N/A 08/18/2014   Procedure: LEFT AND RIGHT HEART CATHETERIZATION WITH CORONARY ANGIOGRAM;  Surgeon: [name], MD;  Location: [facility] CATH LAB;  Service: Cardiovascular;  Laterality:  N/A;  . TOTAL HIP ARTHROPLASTY  2002   bilateral    Current Outpatient Prescriptions  Medication Sig Dispense Refill  . albuterol (PROVENTIL) (2.5 MG/3ML) 0.083% nebulizer solution Take 3 mLs (2.5 mg total) by nebulization every 6 (six) hours as needed for wheezing or shortness of breath. 300 mL 5  . albuterol (VENTOLIN HFA) 108 (90 Base) MCG/ACT inhaler Inhale 2 puffs into the lungs every 4 (four) hours as needed. 18 g 6  . BREO ELLIPTA 200-25 MCG/INH AEPB INHALE 1 PUFF BY MOUTH DAILY 1 each 3  . EPINEPHrine 0.3 mg/0.3 mL IJ SOAJ injection Inject 0.3 mLs (0.3 mg total) into the muscle once. 1 Device 11  . fluticasone (FLONASE) 50 MCG/ACT nasal spray Place 2 sprays into both nostrils daily. 16 g 5  . furosemide (LASIX) 40 MG tablet Take 1 tablet (40 mg total) by mouth 2 (two) times daily. 60 tablet 11  . HYDROcodone-acetaminophen (NORCO/VICODIN) 5-325 MG per tablet Take 1 tablet by mouth every 6 (six) hours as needed for moderate pain. 65 tablet 0  . losartan (COZAAR) 50 MG tablet Take 1 tablet (50 mg total) by mouth daily. 30 tablet 11  . montelukast (SINGULAIR) 10 MG tablet TAKE 1 TABLET BY MOUTH AT BEDTIME 30 tablet 30  . Nebulizers MISC by Does not apply route 4 (four) times daily. Use with nebulizer four times a day as needed for wheezing  or sob    . nitroGLYCERIN (NITROSTAT) 0.4 MG SL tablet Place 1 tablet (0.4 mg total) under the tongue every 5 (five) minutes as needed. 25 tablet 3  . omalizumab (XOLAIR) 150 MG injection Inject 375 mg into the skin every 14 (fourteen) days. 6 vial 6  . omeprazole (PRILOSEC) 40 MG capsule Take 1 capsule by mouth every morning on an empty stomach. Take 30 minutes before eating. 30 capsule 3  . pravastatin (PRAVACHOL) 80 MG tablet Take 1 tablet (80 mg total) by mouth every evening. 30 tablet 11  . rivaroxaban (XARELTO) 20 MG TABS tablet Take 1 tablet (20 mg total) by mouth daily with supper. 30 tablet 6  . sildenafil (VIAGRA) 50 MG tablet Take 1 tablet (50  mg total) by mouth daily as needed for erectile dysfunction. 6 tablet 5  . SPIRIVA HANDIHALER 18 MCG inhalation capsule INHALE THE CONTENTS OF 1 CAPSULE VIA HANDIHALER BY MOUTH DAILY 30 capsule 3   Current Facility-Administered Medications  Medication Dose Route Frequency Provider Last Rate Last Dose  . omalizumab Arvid Right) injection 375 mg  375 mg Subcutaneous Once Deneise Lever, MD        No Known Allergies  Social History   Social History  . Marital status: Legally Separated    Spouse name: N/A  . Number of children: 2  . 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 Smoking status: Former Smoker    Packs/day: 1.00    Years: 7.00    Types: Cigarettes    Quit date: 11/05/1997  . Smokeless tobacco: Never Used  . Alcohol use No  . Drug use: No  . Sexual activity: Not on file   Other Topics Concern  . Not on file   Social History Narrative  . No narrative on file    Family History  Problem Relation Age of Onset  . Dementia Mother   . Cancer Unknown   . Stroke Cousin   . Cancer Maternal Aunt   . Diabetes Cousin   . Colon cancer Neg Hx   . Esophageal cancer Neg Hx   . Heart attack Neg Hx   . Hypertension Neg Hx     Review of Systems:  As stated in the HPI and otherwise negative.   BP 110/66   Pulse 78   Ht 5\' 11"  (1.803 m)   Wt 226 lb 12.8 oz (102.9 kg)   SpO2 95%   BMI 31.63 kg/m   Physical Examination:  General: Well developed, well nourished, NAD  HEENT: OP clear, mucus membranes moist  SKIN: warm, dry. No rashes. Neuro: No focal deficits  Musculoskeletal: Muscle strength 5/5 all ext  Psychiatric: Mood and affect normal  Neck: No JVD, no carotid bruits, no thyromegaly, no lymphadenopathy.  Lungs:Clear bilaterally, no wheezes, rhonci, crackles Cardiovascular: Regular rate and rhythm. No murmurs, gallops or rubs. Abdomen:Soft. Bowel sounds present. Non-tender.    Extremities: No lower extremity edema. Pulses are 2 + in the bilateral DP/PT.  Echo March 2016: - Left ventricle: There is some decrease in wall motion since the study in 2013. Hypokinesis inferior and inferolateral gements. The cavity size was trivially increased. Wall thickness was increased in a pattern of mild LVH. The estimated ejection fraction was 45%. - Left atrium: The atrium was mildly to moderately dilated. - Right ventricle: The cavity size was normal. Systolic function was normal.  EKG:  EKG is ordered today. The ekg ordered today demonstrates Sinus rhythm, rate 76 bpm. PAC.  TWI inferior and anterolateral leads, unchanged.   Recent Labs: 06/23/2016: BUN 16; Creatinine, Ser 1.14; Hemoglobin 13.6; Platelets 244; Potassium 4.3; Sodium 143 09/12/2016: ALT 21   Lipid Panel    Component Value Date/Time   CHOL 152 09/12/2016 0827   TRIG 132 09/12/2016 0827   HDL 40 09/12/2016 0827   CHOLHDL 3.8 09/12/2016 0827   CHOLHDL 3.2 03/02/2015 0814   VLDL 14 03/02/2015 0814   LDLCALC 86 09/12/2016 0827     Wt Readings from Last 3 Encounters:  12/21/16 226 lb 12.8 oz (102.9 kg)  06/22/16 228 lb 3.2 oz (103.5 kg)  06/07/16 221 lb (100.2 kg)     Other studies Reviewed: Additional studies/ records that were reviewed today include: . Review of the above records demonstrates:   Assessment and Plan:   1. CAD without angina: He has mild CAD by cath 2015. He has no chest pain suggestive of angina. Will continue statin and ARB.    2. Non-ischemic Cardiomyopathy: LVEF=45% by echo 2016. Continue Cozaar. No beta blocker with underlying lung disease.    3. Chronic systolic CHF: Volume status is ok today. Will continue Lasix 40 mg po BID.   4. Atrial fibrillation, paroxysmal: Sinus today. Continue Xarelto.    Current medicines are reviewed at length with the patient today.  The patient does not have concerns regarding medicines.  The following changes have been made:   no change  Labs/ tests ordered today include:   Orders Placed This Encounter  Procedures  . EKG 12-Lead    Disposition:   FU with me in 12 months  Signed, Lauree Chandler, MD 12/21/2016 8:48 AM    [facility] [place], [place], [place]  [address] Phone: [phone]; Fax: [phone]

## 2017-01-12 ENCOUNTER — Telehealth: Payer: Self-pay | Admitting: Internal Medicine

## 2017-01-12 NOTE — Telephone Encounter (Signed)
Mr. Essman was supposed to call me back. He didn't so I called him, I had to lmom. I reminded him I need his case workers name & # and his Marvin Chandler.#. I asked him to call Katie next wk. Since I'm off.

## 2017-01-30 NOTE — Telephone Encounter (Signed)
I meant to click on encounter then documentation. Since I hadn't heard from Mr. Henricks I called him back. (I was off 01/16/17-01/20/17, I was catching up (9/24/-01/27/17) I didn't get to everything. Mr. Stroble had to go the [place] last wk.. They have since took over [facility] care. [facility] advised [facility] to take a maintenance inhaler (Pt. Doesn't recall the name of it or the nasal spray they gave him.) Pt. Said he noticed his allergies are getting worse without the xolair. Pt. Can no longer get xolair from [facility]. The [facility] will have to send in a [facility] with his Dr.'s info. There, if they decide to put him back on xolair.

## 2017-11-08 ENCOUNTER — Emergency Department (HOSPITAL_COMMUNITY): Payer: Non-veteran care

## 2017-11-08 ENCOUNTER — Other Ambulatory Visit: Payer: Self-pay

## 2017-11-08 ENCOUNTER — Encounter (HOSPITAL_COMMUNITY): Payer: Self-pay | Admitting: *Deleted

## 2017-11-08 ENCOUNTER — Inpatient Hospital Stay (HOSPITAL_COMMUNITY)
Admission: EM | Admit: 2017-11-08 | Discharge: 2017-11-10 | DRG: 287 | Disposition: A | Discharge status: Home / Self Care | Payer: Non-veteran care | Attending: Cardiovascular Disease | Admitting: Cardiovascular Disease

## 2017-11-08 DIAGNOSIS — I200 Unstable angina: Secondary | ICD-10-CM | POA: Diagnosis not present

## 2017-11-08 DIAGNOSIS — I110 Hypertensive heart disease with heart failure: Secondary | ICD-10-CM | POA: Diagnosis not present

## 2017-11-08 DIAGNOSIS — Z79899 Other long term (current) drug therapy: Secondary | ICD-10-CM

## 2017-11-08 DIAGNOSIS — J9811 Atelectasis: Secondary | ICD-10-CM | POA: Diagnosis not present

## 2017-11-08 DIAGNOSIS — Z7951 Long term (current) use of inhaled steroids: Secondary | ICD-10-CM

## 2017-11-08 DIAGNOSIS — Z7901 Long term (current) use of anticoagulants: Secondary | ICD-10-CM | POA: Diagnosis not present

## 2017-11-08 DIAGNOSIS — Z87891 Personal history of nicotine dependence: Secondary | ICD-10-CM

## 2017-11-08 DIAGNOSIS — J439 Emphysema, unspecified: Secondary | ICD-10-CM | POA: Diagnosis not present

## 2017-11-08 DIAGNOSIS — I25110 Atherosclerotic heart disease of native coronary artery with unstable angina pectoris: Secondary | ICD-10-CM | POA: Diagnosis not present

## 2017-11-08 DIAGNOSIS — I428 Other cardiomyopathies: Secondary | ICD-10-CM | POA: Diagnosis not present

## 2017-11-08 DIAGNOSIS — I480 Paroxysmal atrial fibrillation: Secondary | ICD-10-CM | POA: Diagnosis not present

## 2017-11-08 DIAGNOSIS — I5022 Chronic systolic (congestive) heart failure: Secondary | ICD-10-CM | POA: Diagnosis present

## 2017-11-08 DIAGNOSIS — M160 Bilateral primary osteoarthritis of hip: Secondary | ICD-10-CM | POA: Diagnosis not present

## 2017-11-08 DIAGNOSIS — R0600 Dyspnea, unspecified: Secondary | ICD-10-CM

## 2017-11-08 DIAGNOSIS — R0602 Shortness of breath: Secondary | ICD-10-CM | POA: Diagnosis not present

## 2017-11-08 DIAGNOSIS — I441 Atrioventricular block, second degree: Secondary | ICD-10-CM | POA: Diagnosis not present

## 2017-11-08 DIAGNOSIS — R0789 Other chest pain: Secondary | ICD-10-CM | POA: Diagnosis present

## 2017-11-08 DIAGNOSIS — E785 Hyperlipidemia, unspecified: Secondary | ICD-10-CM | POA: Diagnosis present

## 2017-11-08 DIAGNOSIS — J45909 Unspecified asthma, uncomplicated: Secondary | ICD-10-CM | POA: Diagnosis present

## 2017-11-08 DIAGNOSIS — R079 Chest pain, unspecified: Secondary | ICD-10-CM | POA: Diagnosis present

## 2017-11-08 DIAGNOSIS — R072 Precordial pain: Secondary | ICD-10-CM | POA: Diagnosis not present

## 2017-11-08 LAB — BASIC METABOLIC PANEL
Anion gap: 7 (ref 5–15)
BUN: 10 mg/dL (ref 8–23)
CHLORIDE: 108 mmol/L (ref 98–111)
CO2: 26 mmol/L (ref 22–32)
CREATININE: 1.18 mg/dL (ref 0.61–1.24)
Calcium: 8.8 mg/dL — ABNORMAL LOW (ref 8.9–10.3)
GFR calc Af Amer: >60 mL/min (ref >60)
GFR calc non Af Amer: >60 mL/min (ref >60)
Glucose, Bld: 122 mg/dL — ABNORMAL HIGH (ref 70–99)
Potassium: 3.5 mmol/L (ref 3.5–5.1)
SODIUM: 141 mmol/L (ref 135–145)

## 2017-11-08 LAB — CBC
HCT: 39.3 % (ref 39.0–52.0)
Hemoglobin: 12.9 g/dL — ABNORMAL LOW (ref 13.0–17.0)
MCH: 29.3 pg (ref 26.0–34.0)
MCHC: 32.8 g/dL (ref 30.0–36.0)
MCV: 89.3 fL (ref 78.0–100.0)
PLATELETS: 207 10*3/uL (ref 150–400)
RBC: 4.4 MIL/uL (ref 4.22–5.81)
RDW: 13.5 % (ref 11.5–15.5)
WBC: 6.3 10*3/uL (ref 4.0–10.5)

## 2017-11-08 LAB — HEPATIC FUNCTION PANEL
ALK PHOS: 58 U/L (ref 38–126)
ALT: 14 U/L (ref 0–44)
AST: 17 U/L (ref 15–41)
Albumin: 3.5 g/dL (ref 3.5–5.0)
BILIRUBIN TOTAL: 0.6 mg/dL (ref 0.3–1.2)
Bilirubin, Direct: <0.1 mg/dL (ref 0.0–0.2)
Total Protein: 5.9 g/dL — ABNORMAL LOW (ref 6.5–8.1)

## 2017-11-08 LAB — DIFFERENTIAL
Abs Immature Granulocytes: 0 10*3/uL (ref 0.0–0.1)
BASOS PCT: 1 %
Basophils Absolute: 0 10*3/uL (ref 0.0–0.1)
EOS ABS: 0.1 10*3/uL (ref 0.0–0.7)
Eosinophils Relative: 2 %
Immature Granulocytes: 0 %
Lymphocytes Relative: 32 %
Lymphs Abs: 2.1 10*3/uL (ref 0.7–4.0)
MONO ABS: 0.5 10*3/uL (ref 0.1–1.0)
MONOS PCT: 7 %
NEUTROS PCT: 58 %
Neutro Abs: 3.7 10*3/uL (ref 1.7–7.7)

## 2017-11-08 LAB — I-STAT TROPONIN, ED: Troponin i, poc: 0.01 ng/mL (ref 0.00–0.08)

## 2017-11-08 LAB — TROPONIN I
Troponin I: <0.03 ng/mL (ref <0.03)
Troponin I: <0.03 ng/mL (ref <0.03)

## 2017-11-08 LAB — MRSA PCR SCREENING: MRSA by PCR: NEGATIVE

## 2017-11-08 MED ORDER — LOSARTAN POTASSIUM 50 MG PO TABS
50.0000 mg | ORAL_TABLET | Freq: Every day | ORAL | Status: DC
  Administered 2017-11-09 – 2017-11-10 (×2): 50 mg via ORAL
  Filled 2017-11-08 (×2): qty 1

## 2017-11-08 MED ORDER — ASPIRIN 81 MG PO CHEW
81.0000 mg | CHEWABLE_TABLET | ORAL | Status: AC
  Administered 2017-11-09: 81 mg via ORAL
  Filled 2017-11-08: qty 1

## 2017-11-08 MED ORDER — SODIUM CHLORIDE 0.9 % IV SOLN: 250.0000 mL | INTRAVENOUS | Status: DC | PRN

## 2017-11-08 MED ORDER — TIOTROPIUM BROMIDE MONOHYDRATE 18 MCG IN CAPS
18.0000 ug | ORAL_CAPSULE | Freq: Every day | RESPIRATORY_TRACT | Status: DC
  Administered 2017-11-09: 18 ug via RESPIRATORY_TRACT
  Filled 2017-11-08 (×2): qty 5

## 2017-11-08 MED ORDER — ALBUTEROL SULFATE (2.5 MG/3ML) 0.083% IN NEBU: 2.5000 mg | INHALATION_SOLUTION | Freq: Four times a day (QID) | RESPIRATORY_TRACT | Status: DC | PRN

## 2017-11-08 MED ORDER — ONDANSETRON HCL 4 MG/2ML IJ SOLN: 4.0000 mg | Freq: Four times a day (QID) | INTRAMUSCULAR | Status: DC | PRN

## 2017-11-08 MED ORDER — HEPARIN (PORCINE) IN NACL 100-0.45 UNIT/ML-% IJ SOLN
1450.0000 [IU]/h | INTRAMUSCULAR | Status: DC
  Administered 2017-11-08: 1350 [IU]/h via INTRAVENOUS
  Filled 2017-11-08: qty 250

## 2017-11-08 MED ORDER — FLUTICASONE PROPIONATE 50 MCG/ACT NA SUSP
2.0000 | Freq: Every day | NASAL | Status: DC
  Administered 2017-11-09: 2 via NASAL
  Filled 2017-11-08: qty 16

## 2017-11-08 MED ORDER — ALBUTEROL SULFATE HFA 108 (90 BASE) MCG/ACT IN AERS
2.0000 | INHALATION_SPRAY | RESPIRATORY_TRACT | Status: DC | PRN
  Filled 2017-11-08: qty 6.7

## 2017-11-08 MED ORDER — ATORVASTATIN CALCIUM 80 MG PO TABS
80.0000 mg | ORAL_TABLET | Freq: Every day | ORAL | Status: DC
  Administered 2017-11-08 – 2017-11-09 (×2): 80 mg via ORAL
  Filled 2017-11-08 (×2): qty 1

## 2017-11-08 MED ORDER — PANTOPRAZOLE SODIUM 40 MG PO TBEC
40.0000 mg | DELAYED_RELEASE_TABLET | Freq: Every day | ORAL | Status: DC
  Administered 2017-11-08 – 2017-11-10 (×3): 40 mg via ORAL
  Filled 2017-11-08 (×3): qty 1

## 2017-11-08 MED ORDER — LORATADINE 10 MG PO TABS
10.0000 mg | ORAL_TABLET | Freq: Every day | ORAL | Status: DC
  Administered 2017-11-09 – 2017-11-10 (×2): 10 mg via ORAL
  Filled 2017-11-08 (×2): qty 1

## 2017-11-08 MED ORDER — MOMETASONE FURO-FORMOTEROL FUM 200-5 MCG/ACT IN AERO
2.0000 | INHALATION_SPRAY | Freq: Two times a day (BID) | RESPIRATORY_TRACT | Status: DC
  Administered 2017-11-08 – 2017-11-09 (×2): 2 via RESPIRATORY_TRACT
  Filled 2017-11-08 (×2): qty 8.8

## 2017-11-08 MED ORDER — MONTELUKAST SODIUM 10 MG PO TABS
10.0000 mg | ORAL_TABLET | Freq: Every day | ORAL | Status: DC
  Administered 2017-11-08 – 2017-11-09 (×2): 10 mg via ORAL
  Filled 2017-11-08 (×2): qty 1

## 2017-11-08 MED ORDER — ALBUTEROL SULFATE (2.5 MG/3ML) 0.083% IN NEBU: 2.5000 mg | INHALATION_SOLUTION | RESPIRATORY_TRACT | Status: DC | PRN

## 2017-11-08 MED ORDER — SODIUM CHLORIDE 0.9% FLUSH
3.0000 mL | Freq: Two times a day (BID) | INTRAVENOUS | Status: DC
  Administered 2017-11-08 (×2): 3 mL via INTRAVENOUS

## 2017-11-08 MED ORDER — NITROGLYCERIN 0.4 MG SL SUBL: 0.4000 mg | SUBLINGUAL_TABLET | SUBLINGUAL | Status: DC | PRN

## 2017-11-08 MED ORDER — ACETAMINOPHEN 325 MG PO TABS: 650.0000 mg | ORAL_TABLET | ORAL | Status: DC | PRN

## 2017-11-08 MED ORDER — SODIUM CHLORIDE 0.9% FLUSH: 3.0000 mL | INTRAVENOUS | Status: DC | PRN

## 2017-11-08 MED ORDER — ASPIRIN EC 81 MG PO TBEC
81.0000 mg | DELAYED_RELEASE_TABLET | Freq: Every day | ORAL | Status: DC
  Administered 2017-11-09 – 2017-11-10 (×2): 81 mg via ORAL
  Filled 2017-11-08 (×3): qty 1

## 2017-11-08 MED ORDER — SODIUM CHLORIDE 0.9 % IV SOLN
INTRAVENOUS | Status: DC
  Administered 2017-11-09: 06:00:00 via INTRAVENOUS

## 2017-11-08 NOTE — H&P (Addendum)
Cardiology Admission History and Physical:   Patient ID: Marvin Chandler; MRN: [id number]; DOB: 19-Dec-1950   Admission date: 11/08/2017  Primary Care Provider: Posey Boyer, MD Primary Cardiologist: Lauree Chandler, MD  Primary Electrophysiologist:  None  Chief Complaint:  Chest pain  Patient Profile:   Marvin Chandler is a 66 y.o. male with a history of non-obstructive CAD, non-ischemic cardiomyopathy (EF 45% on last echo 2016), HTN, HLD, asthma, who was sent to [facility] ED from the [facility] for evaluation of chest pain  History of Present Illness:   Marvin Chandler reports intermittent chest pressure for the past 2 months, worsening over the past 1-2 weeks. He states chest pressure worsens with activity like yard work or going up a flight of stairs, and is relieved with rest and occasional SL nitro use. He reports using SL nitro 1x per week for the last 2 months and at least 4 times in the last week. He reports associated SOB when CP occurs. He also reports increasing fatigue for the past 2 months and decreased exercise tolerance. He reports over the past 1-2 weeks the level of activity required to bring on symptoms has decreased. He presented to his PCP office today and reported CP. He received 1 SL nitro at the [place] office with some improvement in CP. Decision was made to transfer patient to [facility] ED. He received a 2nd SL nitro by EMS en route to the ED which resolved his symptoms.    He was last evaluated by Dr. Angelena Form 11/2016 and was thought to be doing well from a cardiac standpoint. He reported chronic dyspnea which was unchanged. He denied CP, palpitations, LEE, orthopnea, PND, dizziness, near syncope, or syncope. No medication changes occurred at this visit. His last echo was in 2016 which revealed an EF 45%. He underwent R/LHC at that time and was found to have non-obstructive CAD, therefore was diagnosed with non-ischemic cardiomyopathy. He has not had an ischemic evaluation since that time.    At the time of this evaluation, he is chest pain free. He has not had a reoccurrence since arrival to the ED. He reports chronic orthopnea which is unchanged. He reports compliance with his home medications; last dose of xarelto was yesterday evening. On ROS he reports some blood in his stool last week, as well as intermittent black stools. He reports having a colonoscopy last year which was normal. He also reports a chronic productive cough which is unchanged. He denies PND, LE edema, dizziness, lightheadedness, syncope, or palpitations.   ED course: VSS. Labs notable for K 3.5, Cr 1.18 (baseline), Hgb 12.9, PLT 2017, and trop negative x1. CXR with bibasilar atelectasis/infiltrate. EKG with rate 58, second degree AV block type 1, isolated TWI III, submm STE in V2-3. Cardiology asked to evaluate patient for chest pain.   Past Medical History:  Diagnosis Date  . Arthritis of hip    bilateral  . Asthma   . CAD (coronary artery disease)    LHC 5/16:  Dx ostial 30%, LAD luminal irregs, EF 40%  . Chronic systolic CHF (congestive heart failure) ([place])    a. Echo 3/16:  inf and inf-lat HK, mild LVH, EF 45%, mild to mod LAE, normal RVF  . Cough   . Hiatal hernia   . NICM (nonischemic cardiomyopathy) ([place])   . Osteoarthritis   . Personal history of colonic adenomas 02/20/2013  . Pneumonia     Past Surgical History:  Procedure Laterality Date  . COLONOSCOPY  02/13/13  .  LEFT AND RIGHT HEART CATHETERIZATION WITH CORONARY ANGIOGRAM N/A 08/18/2014   Procedure: LEFT AND RIGHT HEART CATHETERIZATION WITH CORONARY ANGIOGRAM;  Surgeon: [name], MD;  Location: [facility] CATH LAB;  Service: Cardiovascular;  Laterality: N/A;  . TOTAL HIP ARTHROPLASTY  2002   bilateral     Medications Prior to Admission: Prior to Admission medications   Medication Sig Start Date End Date Taking? Authorizing Provider  albuterol (PROVENTIL) (2.5 MG/3ML) 0.083% nebulizer solution Take 3 mLs (2.5 mg total) by  nebulization every 6 (six) hours as needed for wheezing or shortness of breath. 07/22/14  Yes Theodis Blaze, MD  albuterol (VENTOLIN HFA) 108 (90 Base) MCG/ACT inhaler Inhale 2 puffs into the lungs every 4 (four) hours as needed. 06/13/16  Yes Brand Males, MD  budesonide-formoterol (SYMBICORT) 160-4.5 MCG/ACT inhaler Inhale 2 puffs into the lungs daily.   Yes [provider]  cetirizine (ZYRTEC) 10 MG tablet Take 10 mg by mouth daily.   Yes [provider]  EPINEPHrine 0.3 mg/0.3 mL IJ SOAJ injection Inject 0.3 mLs (0.3 mg total) into the muscle once. 08/05/15  Yes Young, Tarri Fuller D, MD  fluticasone (FLONASE) 50 MCG/ACT nasal spray Place 2 sprays into both nostrils daily. 07/11/16  Yes Brand Males, MD  furosemide (LASIX) 40 MG tablet Take 1 tablet (40 mg total) by mouth 2 (two) times daily. 06/22/16  Yes Burnell Blanks, MD  ibuprofen (ADVIL,MOTRIN) 200 MG tablet Take 400 mg by mouth every 6 (six) hours as needed for moderate pain.   Yes [provider]  losartan (COZAAR) 50 MG tablet Take 1 tablet (50 mg total) by mouth daily. 06/22/16  Yes Burnell Blanks, MD  montelukast (SINGULAIR) 10 MG tablet TAKE 1 TABLET BY MOUTH AT BEDTIME 08/02/16  Yes Brand Males, MD  nitroGLYCERIN (NITROSTAT) 0.4 MG SL tablet Place 1 tablet (0.4 mg total) under the tongue every 5 (five) minutes as needed. 08/26/16  Yes Burnell Blanks, MD  omeprazole (PRILOSEC) 40 MG capsule Take 1 capsule by mouth every morning on an empty stomach. Take 30 minutes before eating. 12/31/13  Yes Barton Fanny, MD  pravastatin (PRAVACHOL) 80 MG tablet Take 1 tablet (80 mg total) by mouth every evening. 06/24/16  Yes Burnell Blanks, MD  rivaroxaban (XARELTO) 20 MG TABS tablet Take 1 tablet (20 mg total) by mouth daily with supper. 06/22/16  Yes Burnell Blanks, MD  sildenafil (VIAGRA) 50 MG tablet Take 1 tablet (50 mg total) by mouth daily as needed for erectile  dysfunction. 12/18/14  Yes Burnell Blanks, MD  SPIRIVA HANDIHALER 18 MCG inhalation capsule INHALE THE CONTENTS OF 1 CAPSULE VIA HANDIHALER BY MOUTH DAILY 06/09/16  Yes Brand Males, MD  BREO ELLIPTA 200-25 MCG/INH AEPB INHALE 1 PUFF BY MOUTH DAILY Patient not taking: Reported on 11/08/2017 08/02/16   Brand Males, MD  HYDROcodone-acetaminophen (NORCO/VICODIN) 5-325 MG per tablet Take 1 tablet by mouth every 6 (six) hours as needed for moderate pain. Patient not taking: Reported on 11/08/2017 07/22/14   Theodis Blaze, MD  Nebulizers MISC by Does not apply route 4 (four) times daily. Use with nebulizer four times a day as needed for wheezing or sob    [provider]  omalizumab Arvid Right) 150 MG injection Inject 375 mg into the skin every 14 (fourteen) days. Patient not taking: Reported on 11/08/2017 12/14/16   Deneise Lever, MD     Allergies:   No Known Allergies  Social History:   Social  History   Socioeconomic History  . Marital status: Legally Separated    Spouse name: Not on file  . Number of children: 2  . Years of education: Not on file  . Highest education level: Not on file  Occupational History  . Occupation: Scientist, water quality: [employer]  Social Needs  . Financial resource strain: Not on file  . Food insecurity:    Worry: Not on file    Inability: Not on file  . Transportation needs:    Medical: Not on file    Non-medical: Not on file  Tobacco Use  . Smoking status: Former Smoker    Packs/day: 1.00    Years: 7.00    Pack years: 7.00    Types: Cigarettes    Last attempt to quit: 11/05/1997    Years since quitting: 20.0  . Smokeless tobacco: Never Used  Substance and Sexual Activity  . Alcohol use: No    Alcohol/week: 0.0 oz  . Drug use: No  . Sexual activity: Not on file  Lifestyle  . Physical activity:    Days per week: Not on file    Minutes per session: Not on file  . Stress: Not on file   Relationships  . Social connections:    Talks on phone: Not on file    Gets together: Not on file    Attends religious service: Not on file    Active member of club or organization: Not on file    Attends meetings of clubs or organizations: Not on file    Relationship status: Not on file  . Intimate partner violence:    Fear of current or ex partner: Not on file    Emotionally abused: Not on file    Physically abused: Not on file    Forced sexual activity: Not on file  Other Topics Concern  . Not on file  Social History Narrative  . Not on file    Family History:   The patient's family history includes Cancer in his maternal aunt and unknown relative; Dementia in his mother; Diabetes in his cousin; Stroke in his cousin. There is no history of Colon cancer, Esophageal cancer, Heart attack, or Hypertension.    ROS:  Please see the history of present illness.  All other ROS reviewed and negative.     Physical Exam/Data:   Vitals:   11/08/17 1002 11/08/17 1015 11/08/17 1115 11/08/17 1118  BP:    130/88  Pulse:  66 82 60  Resp:  16 15 17   Temp:      TempSrc:      SpO2:  99% 100% 97%  Weight: 226 lb (102.5 kg)     Height: 5\' 11"  (1.803 m)      No intake or output data in the 24 hours ending 11/08/17 1202 Filed Weights   11/08/17 1002  Weight: 226 lb (102.5 kg)   Body mass index is 31.52 kg/m.  General:  Well nourished, well developed, laying in bed in no acute distress HEENT: sclera anicteric Neck: no JVD Vascular: No carotid bruits; distal pulses 2+ bilaterally Cardiac:  normal S1, S2; RRR; no murmurs, gallops, or rubs Lungs:  clear to auscultation bilaterally, no wheezing, rhonchi or rales  Abd: soft, obese, nontender, no hepatomegaly  Ext: no edema Musculoskeletal:  No deformities, BUE and BLE strength normal and equal Skin: warm and dry  Neuro:  CNs 2-12 intact, no focal abnormalities noted Psych:  Normal  affect    EKG:  rate 58, second degree AV block type  1, isolated TWI III, submm STE in V2-3  Relevant CV Studies: Left/ Right heart catheterization 2016: Angiographic Findings:  Left main: No obstructive disease.   Left Anterior Descending Artery: Large caliber vessel that courses to the apex. Mild luminal irregularities noted in the mid vessel. Small caliber diagonal branch with ostial 30% stenosis.   Circumflex Artery: Large caliber vessel with large obtuse marginal branch. No obstructive disease.   Right Coronary Artery: Large dominant vessel with no obstructive disease.   Left Ventricular Angiogram: LVEF=40% with global hypokinesis with slightly more prominent hypokinesis of the inferior wall.   Impression: 1. Mild non-obstructive CAD 2. Moderate LV systolic dysfunction (Non-ischemic cardiomyopathy) 3. Slight elevation filling pressures.   Recommendations: Will increase Lasix to 40 mg po BID. Will add Cozaar 50 mg daily. Will consider adding beta blocker at the time of cath follow up.   Echocardiogram 2016: Study Conclusions  - Left ventricle: There is some decrease in wall motion since the study in 2013. Hypokinesis inferior and inferolateral gements. The cavity size was trivially increased. Wall thickness was increased in a pattern of mild LVH. The estimated ejection fraction was 45%. - Left atrium: The atrium was mildly to moderately dilated. - Right ventricle: The cavity size was normal. Systolic function was normal.  Laboratory Data:  Chemistry Recent Labs  Lab 11/08/17 1011  NA 141  K 3.5  CL 108  CO2 26  GLUCOSE 122*  BUN 10  CREATININE 1.18  CALCIUM 8.8*  GFRNONAA >60  GFRAA >60  ANIONGAP 7    No results for input(s): PROT, ALBUMIN, AST, ALT, ALKPHOS, BILITOT in the last 168 hours. Hematology Recent Labs  Lab 11/08/17 1011  WBC 6.3  RBC 4.40  HGB 12.9*  HCT 39.3  MCV 89.3  MCH 29.3  MCHC 32.8  RDW 13.5  PLT 207   Cardiac EnzymesNo results for input(s): TROPONINI in the  last 168 hours.  Recent Labs  Lab 11/08/17 1019  TROPIPOC 0.01    BNPNo results for input(s): BNP, PROBNP in the last 168 hours.  DDimer No results for input(s): DDIMER in the last 168 hours.  Radiology/Studies:  Dg Chest 2 View  Result Date: 11/08/2017 CLINICAL DATA:  Chest pain short of breath EXAM: CHEST - 2 VIEW COMPARISON:  07/25/2014 FINDINGS: Heart size and vascularity normal.  Negative for heart failure. Patchy density in the right middle lobe has progressed. Mild airspace disease in the left lower lobe also has progressed. No effusion. IMPRESSION: Bibasilar atelectasis/infiltrate. Electronically Signed   By: Franchot Gallo M.D.   On: 11/08/2017 10:43    Assessment and Plan:   1. Chest pain in patient with minimal non-obstructive CAD history: patient reports intermittent exertional chest pain for the past 2 months, worsening over the past 1-2 weeks. Also with increasing fatigue and exercise intolerance. Pain is relieved with SL nitro. Here trop negative x1. 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 His last ischemic evaluation was a LHC 2016 with non-obstructive CAD. Echo at that time with EF 45%. Symptoms are concerning for unstable angina.  - Continue to trend trop x3 or to peak - Will check an echocardiogram  - Will start heparin gtt  - Will plan for [facility] tomorrow after xarelto washout - Continue prn SL nitro  2. Chronic systolic CHF/ non-ischemic cardiomyopathy: Last echo 2016 with EF 45% and mild LVH. He had a LHC in 2016  as well which revealed mild non-obstructive CAD, moderate LV systolic dysfunction, and slight elevation of filling pressures. Previously not on a BBlocker due to asthma history. He appears euvolemic on exam and CXR is without pulmonary edema. - Will repeat an echocardiogram - Continue home losartan  - Will hold home lasix in anticipation of LHC tomorrow  3. 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 Not on any AV nodal blocking agents.  No signs of hemodynamic instability and he denies palpitations. - Continue to monitor on telemetry  4. Paroxysmal atrial fibrillation: appears to be maintaining sinus rhythm at this time. Has not required AV nodal blocking agent in the past, and now given EKG with 2nd degree block, would favor avoiding at this time. CHA2DS2-VASc Score of 3 (CHF, HTN, Age 23-74). - Hold xarelto in anticipation of LHC tomorrow  5. HTN: BP stable - Continue home losartan  6. HLD: LDL 86 08/2016; goal <70 - Will transition to atorvastatin  7. Asthma: reports a chronic productive cough. CXR with atelectasis vs infection. Patient is afebrile and WBC wnl.  - Continue home breo, albuterol, symbicord, and singular - Resume home xolair at discharge - Incentive spirometer  8. Dark stools/ hematochezia: patient reports a couple episodes of dark stools with intermittent red blood on tissue. Reports having a colonoscopy last year which was normal. Hgb 12.9 today (13.6 last year) - Will check occult stool - Monitor Hgb closely with anticoagulation  Severity of Illness: The appropriate patient status for this patient is INPATIENT. Inpatient status is judged to be reasonable and necessary in order to provide the required intensity of service to ensure the patient's safety. The patient's presenting symptoms, physical exam findings, and initial radiographic and laboratory data in the context of their chronic comorbidities is felt to place them at high risk for further clinical deterioration. Furthermore, it is not anticipated that the patient will be medically stable for discharge from the hospital within 2 midnights of admission. The following factors support the patient status of inpatient.   " The patient's presenting symptoms include exertional chest pain and DOE. " The worrisome physical exam findings include benign cardiac exam. " The initial radiographic and laboratory data are worrisome because of EKG with 2nd degree  AV block type 1.  " The chronic co-morbidities include HTN, HLD, non-ischemic cardiomyopathy, and asthma.   * I certify that at the point of admission it is my clinical judgment that the patient will require inpatient hospital care spanning beyond 2 midnights from the point of admission due to high intensity of service, high risk for further deterioration and high frequency of surveillance required.*    For questions or updates, please contact [place] Please consult [email or web address] for contact info under Cardiology/STEMI.   Signed, Abigail Butts, PA-C  11/08/2017 12:02 PM   Patient seen, examined. Available data reviewed. Agree with findings, assessment, and plan as outlined by Roby Lofts, PA-C.  The patient is independently interviewed and examined.  He is an alert, oriented male in no distress.  JVP is normal, carotid upstrokes are normal without bruits, lung fields are clear, heart is regular rate and rhythm without murmur or gallop, abdomen is soft with mild right lower quadrant tenderness but no rebound or guarding, bowel sounds are present, extremities show no edema.  Strength is intact and equal bilaterally.  While the patient has a history of nonobstructive coronary artery disease by cardiac catheterization in 2016, his symptoms are very concerning  with progressive exertional chest tightness and shortness of breath now occurring with low-level exertion consistent with CCS class III angina/crescendo angina.  He has had occasional rest discomfort and orthopnea.  As outlined above, would favor definitive evaluation with cardiac catheterization and possible PCI. I have reviewed the risks, indications, and alternatives to cardiac catheterization, possible angioplasty, and stenting with the patient. Risks include but are not limited to bleeding, infection, vascular injury, stroke, myocardial infection, arrhythmia, kidney injury, radiation-related injury in the case of prolonged  fluoroscopy use, emergency cardiac surgery, and death. The patient understands the risks of serious complication is 1-2 in 1700 with diagnostic cardiac cath and 1-2% or less with angioplasty/stenting.   The patient is treated with rivaroxaban for paroxysmal atrial fibrillation, currently maintaining sinus rhythm.  His last dose was yesterday.  Will hold in anticipation of cardiac catheterization tomorrow.  With negative cardiac enzymes, will hold off on IV heparin at this time.  Sherren Mocha, M.D. 11/08/2017 5:00 PM

## 2017-11-08 NOTE — Progress Notes (Signed)
ANTICOAGULATION CONSULT NOTE - Initial Consult  Pharmacy Consult for Heparin Indication: chest pain/ACS  No Known Allergies  Patient Measurements: Height: 5\' 11"  (180.3 cm) Weight: 226 lb (102.5 kg) IBW/kg (Calculated) : 75.3 Heparin Dosing Weight: 96.6 kg  Vital Signs: Temp: 98.1 F (36.7 C) (07/10 0953) Temp Source: Oral (07/10 0953) BP: 160/103 (07/10 1400) Pulse Rate: 67 (07/10 1400)  Labs: Recent Labs    11/08/17 1011  HGB 12.9*  HCT 39.3  PLT 207  CREATININE 1.18    Estimated Creatinine Clearance: 73.1 mL/min (by C-G formula based on SCr of 1.18 mg/dL).   Medical History: Past Medical History:  Diagnosis Date  . Arthritis of hip    bilateral  . Asthma   . CAD (coronary artery disease)    LHC 5/16:  Dx ostial 30%, LAD luminal irregs, EF 40%  . Chronic systolic CHF (congestive heart failure) ([place])    a. Echo 3/16:  inf and inf-lat HK, mild LVH, EF 45%, mild to mod LAE, normal RVF  . Cough   . Hiatal hernia   . NICM (nonischemic cardiomyopathy) ([place])   . Osteoarthritis   . Personal history of colonic adenomas 02/20/2013  . Pneumonia   Atrial fibrillation  Medications:  Xarelto PTA 20mg  daily - last dose 11/07/17 at 2200 PM.   Assessment: 67 year old male with significant cardiac history presenting to ED with chest pain to start IV Heparin per pharmacy dosing. Patient was taking Xarelto for atrial fibrillation. Last dose was yesterday PM.   H/H and Platelets are within normal limits.  Troponin 0.01.  SCr 1.18 with estimated CrCl ~ 73 mL/min.  No bleeding reported.   Goal of Therapy:  Heparin level 0.3-0.7 units/ml aPTT 66-102 seconds Monitor platelets by anticoagulation protocol: Yes   Plan:  Heparin to start at 2200 PM (24 hrs post Xarelto) at rate of 1350 units/hr.  APTT and Heparin level 6 hours after initiation.  Daily Heparin level and CBC.  Follow-up plan for Cath in AM.   Sloan Leiter, PharmD, BCPS, BCCCP Clinical Pharmacist Clinical  phone 11/08/2017 until 11:30PM - [phone] After hours, please call #[phone] 11/08/2017,3:10 PM

## 2017-11-08 NOTE — ED Triage Notes (Signed)
Pt sent here from [place] for midsternal chest tightness, sternal pressure and sob that was only slightly relieved with 1 nitro.  Given 324 asa and [facility]  Pt now pain free.  EKG showed bbb per norm.  VS stable.

## 2017-11-08 NOTE — ED Notes (Signed)
Got patient undress on the monitor did ekg shown to Dr Roderic Palau patient is resting with call bell in reach

## 2017-11-08 NOTE — ED Provider Notes (Signed)
[facility] EMERGENCY DEPARTMENT Provider Note   CSN: [id number] Arrival date & time: 11/08/17  4401     History   Chief Complaint Chief Complaint  Patient presents with  . Chest Pain    HPI Marvin Chandler is a 67 y.o. male.  Patient complains of chest discomfort.  Worse when he is exerting himself.  Patient has history of coronary disease  The history is provided by the patient. No language interpreter was used.  Chest Pain   This is a new problem. The current episode started more than 2 days ago. The problem occurs constantly. The problem has been resolved. The pain is associated with exertion. The pain is present in the substernal region. The pain is at a severity of 7/10. The pain is moderate. The quality of the pain is described as dull. The pain does not radiate. Pertinent negatives include no abdominal pain, no back pain, no cough and no headaches.  Pertinent negatives for past medical history include no seizures.    Past Medical History:  Diagnosis Date  . Arthritis of hip    bilateral  . Asthma   . CAD (coronary artery disease)    LHC 5/16:  Dx ostial 30%, LAD luminal irregs, EF 40%  . Chronic systolic CHF (congestive heart failure) ([place])    a. Echo 3/16:  inf and inf-lat HK, mild LVH, EF 45%, mild to mod LAE, normal RVF  . Cough   . Hiatal hernia   . NICM (nonischemic cardiomyopathy) ([place])   . Osteoarthritis   . Personal history of colonic adenomas 02/20/2013  . Pneumonia     Patient Active Problem List   Diagnosis Date Noted  . Asthma with acute exacerbation 10/06/2014  . History of asthma 08/15/2014  . Hemoptysis 08/15/2014  . Chronic cough 08/15/2014  . Cardiomyopathy ([place]) 08/13/2014  . Asthma exacerbation 07/16/2014  . Pleuritic chest pain 07/16/2014  . Asthma, chronic 02/19/2014  . Personal history of colonic adenomas 02/20/2013  . Dyspnea 12/16/2011  . Chest pain 12/16/2011  . HIATAL HERNIA 07/11/2006  . OSTEOARTHRITIS  07/11/2006  . ARTHRITIS, HIPS, BILATERAL 07/11/2006    Past Surgical History:  Procedure Laterality Date  . COLONOSCOPY  02/13/13  . LEFT AND RIGHT HEART CATHETERIZATION WITH CORONARY ANGIOGRAM N/A 08/18/2014   Procedure: LEFT AND RIGHT HEART CATHETERIZATION WITH CORONARY ANGIOGRAM;  Surgeon: [name], MD;  Location: [facility] CATH LAB;  Service: Cardiovascular;  Laterality: N/A;  . TOTAL HIP ARTHROPLASTY  2002   bilateral        Home Medications    Prior to Admission medications   Medication Sig Start Date End Date Taking? Authorizing Provider  albuterol (PROVENTIL) (2.5 MG/3ML) 0.083% nebulizer solution Take 3 mLs (2.5 mg total) by nebulization every 6 (six) hours as needed for wheezing or shortness of breath. 07/22/14  Yes Theodis Blaze, MD  albuterol (VENTOLIN HFA) 108 (90 Base) MCG/ACT inhaler Inhale 2 puffs into the lungs every 4 (four) hours as needed. 06/13/16  Yes Brand Males, MD  budesonide-formoterol (SYMBICORT) 160-4.5 MCG/ACT inhaler Inhale 2 puffs into the lungs daily.   Yes [provider]  cetirizine (ZYRTEC) 10 MG tablet Take 10 mg by mouth daily.   Yes [provider]  EPINEPHrine 0.3 mg/0.3 mL IJ SOAJ injection Inject 0.3 mLs (0.3 mg total) into the muscle once. 08/05/15  Yes Young, Tarri Fuller D, MD  fluticasone (FLONASE) 50 MCG/ACT nasal spray Place 2 sprays into both nostrils daily. 07/11/16  Yes  Brand Males, MD  furosemide (LASIX) 40 MG tablet Take 1 tablet (40 mg total) by mouth 2 (two) times daily. 06/22/16  Yes Burnell Blanks, MD  ibuprofen (ADVIL,MOTRIN) 200 MG tablet Take 400 mg by mouth every 6 (six) hours as needed for moderate pain.   Yes [provider]  losartan (COZAAR) 50 MG tablet Take 1 tablet (50 mg total) by mouth daily. 06/22/16  Yes Burnell Blanks, MD  montelukast (SINGULAIR) 10 MG tablet TAKE 1 TABLET BY MOUTH AT BEDTIME 08/02/16  Yes Brand Males, MD  nitroGLYCERIN (NITROSTAT) 0.4 MG SL  tablet Place 1 tablet (0.4 mg total) under the tongue every 5 (five) minutes as needed. 08/26/16  Yes Burnell Blanks, MD  omeprazole (PRILOSEC) 40 MG capsule Take 1 capsule by mouth every morning on an empty stomach. Take 30 minutes before eating. 12/31/13  Yes Barton Fanny, MD  pravastatin (PRAVACHOL) 80 MG tablet Take 1 tablet (80 mg total) by mouth every evening. 06/24/16  Yes Burnell Blanks, MD  rivaroxaban (XARELTO) 20 MG TABS tablet Take 1 tablet (20 mg total) by mouth daily with supper. 06/22/16  Yes Burnell Blanks, MD  sildenafil (VIAGRA) 50 MG tablet Take 1 tablet (50 mg total) by mouth daily as needed for erectile dysfunction. 12/18/14  Yes Burnell Blanks, MD  SPIRIVA HANDIHALER 18 MCG inhalation capsule INHALE THE CONTENTS OF 1 CAPSULE VIA HANDIHALER BY MOUTH DAILY 06/09/16  Yes Brand Males, MD  Nebulizers MISC by Does not apply route 4 (four) times daily. Use with nebulizer four times a day as needed for wheezing or sob    [provider]    Family History Family History  Problem Relation Age of Onset  . Dementia Mother   . Cancer Unknown   . Stroke Cousin   . Cancer Maternal Aunt   . Diabetes Cousin   . Colon cancer Neg Hx   . Esophageal cancer Neg Hx   . Heart attack Neg Hx   . Hypertension Neg Hx     Social History Social History   Tobacco Use  . Smoking status: Former Smoker    Packs/day: 1.00    Years: 7.00    Pack years: 7.00    Types: Cigarettes    Last attempt to quit: 11/05/1997    Years since quitting: 20.0  . Smokeless tobacco: Never Used  Substance Use Topics  . Alcohol use: No    Alcohol/week: 0.0 oz  . Drug use: No     Allergies   Patient has no known allergies.   Review of Systems Review of Systems  Constitutional: Negative for appetite change and fatigue.  HENT: Negative for congestion, ear discharge and sinus pressure.   Eyes: Negative for discharge.  Respiratory: Negative for cough.     Cardiovascular: Positive for chest pain.  Gastrointestinal: Negative for abdominal pain and diarrhea.  Genitourinary: Negative for frequency and hematuria.  Musculoskeletal: Negative for back pain.  Skin: Negative for rash.  Neurological: Negative for seizures and headaches.  Psychiatric/Behavioral: Negative for hallucinations.     Physical Exam Updated Vital Signs BP (!) 160/103   Pulse 67   Temp 98.1 F (36.7 C) (Oral)   Resp 18   Ht 5\' 11"  (1.803 m)   Wt 102.5 kg (226 lb)   SpO2 100%   BMI 31.52 kg/m   Physical Exam  Constitutional: He is oriented to person, place, and time. He appears well-developed.  HENT:  Head: Normocephalic.  Eyes:  Conjunctivae and EOM are normal. No scleral icterus.  Neck: Neck supple. No thyromegaly present.  Cardiovascular: Normal rate and regular rhythm. Exam reveals no gallop and no friction rub.  No murmur heard. Pulmonary/Chest: No stridor. He has no wheezes. He has no rales. He exhibits no tenderness.  Abdominal: He exhibits no distension. There is no tenderness. There is no rebound.  Musculoskeletal: Normal range of motion. He exhibits no edema.  Lymphadenopathy:    He has no cervical adenopathy.  Neurological: He is oriented to person, place, and time. He exhibits normal muscle tone. Coordination normal.  Skin: No rash noted. No erythema.  Psychiatric: He has a normal mood and affect. His behavior is normal.     ED Treatments / Results  Labs (all labs ordered are listed, but only abnormal results are displayed) Labs Reviewed  BASIC METABOLIC PANEL - Abnormal; Notable for the following components:      Result Value   Glucose, Bld 122 (*)    Calcium 8.8 (*)    All other components within normal limits  CBC - Abnormal; Notable for the following components:   Hemoglobin 12.9 (*)    All other components within normal limits  HEPATIC FUNCTION PANEL - Abnormal; Notable for the following components:   Total Protein 5.9 (*)    All  other components within normal limits  DIFFERENTIAL  I-STAT TROPONIN, ED    EKG None  Radiology Dg Chest 2 View  Result Date: 11/08/2017 CLINICAL DATA:  Chest pain short of breath EXAM: CHEST - 2 VIEW COMPARISON:  07/25/2014 FINDINGS: Heart size and vascularity normal.  Negative for heart failure. Patchy density in the right middle lobe has progressed. Mild airspace disease in the left lower lobe also has progressed. No effusion. IMPRESSION: Bibasilar atelectasis/infiltrate. Electronically Signed   By: Franchot Gallo M.D.   On: 11/08/2017 10:43    Procedures Procedures (including critical care time)  Medications Ordered in ED Medications - No data to display   Initial Impression / Assessment and Plan / ED Course  I have reviewed the triage vital signs and the nursing notes.  Pertinent labs & imaging results that were available during my care of the patient were reviewed by me and considered in my medical decision making (see chart for details).     Patient with chest pain.  Labs unremarkable.  Patient was seen by cardiology and they will admit him for further work-up Final Clinical Impressions(s) / ED Diagnoses   Final diagnoses:  Unstable angina [facility])    ED Discharge Orders    None       Milton Ferguson, MD 11/08/17 1436

## 2017-11-08 NOTE — ED Notes (Signed)
PT placed on 2L [place] for sob, even though sats remain in upper 90's and rr are 16.

## 2017-11-09 ENCOUNTER — Encounter (HOSPITAL_COMMUNITY)
Admission: EM | Disposition: A | Discharge status: Home / Self Care | Payer: Self-pay | Source: Home / Self Care | Attending: Cardiovascular Disease

## 2017-11-09 ENCOUNTER — Inpatient Hospital Stay (HOSPITAL_COMMUNITY): Payer: Non-veteran care

## 2017-11-09 ENCOUNTER — Encounter (HOSPITAL_COMMUNITY): Payer: Self-pay | Admitting: Cardiovascular Disease

## 2017-11-09 DIAGNOSIS — I25110 Atherosclerotic heart disease of native coronary artery with unstable angina pectoris: Principal | ICD-10-CM

## 2017-11-09 DIAGNOSIS — R079 Chest pain, unspecified: Secondary | ICD-10-CM

## 2017-11-09 HISTORY — PX: LEFT HEART CATH AND CORONARY ANGIOGRAPHY: CATH118249

## 2017-11-09 LAB — BRAIN NATRIURETIC PEPTIDE: B NATRIURETIC PEPTIDE 5: 94.4 pg/mL (ref 0.0–100.0)

## 2017-11-09 LAB — BASIC METABOLIC PANEL
ANION GAP: 9 (ref 5–15)
BUN: 10 mg/dL (ref 8–23)
CALCIUM: 9.1 mg/dL (ref 8.9–10.3)
CO2: 29 mmol/L (ref 22–32)
Chloride: 102 mmol/L (ref 98–111)
Creatinine, Ser: 1.41 mg/dL — ABNORMAL HIGH (ref 0.61–1.24)
GFR, EST AFRICAN AMERICAN: 58 mL/min — AB (ref >60)
GFR, EST NON AFRICAN AMERICAN: 50 mL/min — AB (ref >60)
GLUCOSE: 110 mg/dL — AB (ref 70–99)
Potassium: 4.2 mmol/L (ref 3.5–5.1)
Sodium: 140 mmol/L (ref 135–145)

## 2017-11-09 LAB — LIPID PANEL
CHOLESTEROL: 122 mg/dL (ref 0–200)
HDL: 32 mg/dL — AB (ref >40)
LDL Cholesterol: 71 mg/dL (ref 0–99)
TRIGLYCERIDES: 96 mg/dL (ref <150)
Total CHOL/HDL Ratio: 3.8 RATIO
VLDL: 19 mg/dL (ref 0–40)

## 2017-11-09 LAB — HEMOGLOBIN A1C
HEMOGLOBIN A1C: 6.5 % — AB (ref 4.8–5.6)
MEAN PLASMA GLUCOSE: 139.85 mg/dL

## 2017-11-09 LAB — CBC
HEMATOCRIT: 40.9 % (ref 39.0–52.0)
HEMOGLOBIN: 13.1 g/dL (ref 13.0–17.0)
MCH: 28.8 pg (ref 26.0–34.0)
MCHC: 32 g/dL (ref 30.0–36.0)
MCV: 89.9 fL (ref 78.0–100.0)
Platelets: 201 10*3/uL (ref 150–400)
RBC: 4.55 MIL/uL (ref 4.22–5.81)
RDW: 13.5 % (ref 11.5–15.5)
WBC: 8.6 10*3/uL (ref 4.0–10.5)

## 2017-11-09 LAB — HEPARIN LEVEL (UNFRACTIONATED): HEPARIN UNFRACTIONATED: 0.45 [IU]/mL (ref 0.30–0.70)

## 2017-11-09 LAB — TROPONIN I

## 2017-11-09 LAB — ECHOCARDIOGRAM COMPLETE
HEIGHTINCHES: 71 in
WEIGHTICAEL: 3619.2 [oz_av]

## 2017-11-09 LAB — HIV ANTIBODY (ROUTINE TESTING W REFLEX): HIV SCREEN 4TH GENERATION: NONREACTIVE

## 2017-11-09 LAB — APTT
aPTT: 49 seconds — ABNORMAL HIGH (ref 24–36)
aPTT: 100 seconds — ABNORMAL HIGH (ref 24–36)

## 2017-11-09 SURGERY — LEFT HEART CATH AND CORONARY ANGIOGRAPHY
Anesthesia: LOCAL

## 2017-11-09 MED ORDER — IOPAMIDOL (ISOVUE-370) INJECTION 76%
INTRAVENOUS | Status: AC
  Filled 2017-11-09: qty 100

## 2017-11-09 MED ORDER — SODIUM CHLORIDE 0.9% FLUSH: 3.0000 mL | INTRAVENOUS | Status: DC | PRN

## 2017-11-09 MED ORDER — SODIUM CHLORIDE 0.9% FLUSH
3.0000 mL | Freq: Two times a day (BID) | INTRAVENOUS | Status: DC
  Administered 2017-11-09: 3 mL via INTRAVENOUS

## 2017-11-09 MED ORDER — ASPIRIN 81 MG PO CHEW: 81.0000 mg | CHEWABLE_TABLET | Freq: Every day | ORAL | Status: DC

## 2017-11-09 MED ORDER — HEPARIN SODIUM (PORCINE) 1000 UNIT/ML IJ SOLN
INTRAMUSCULAR | Status: AC
  Filled 2017-11-09: qty 1

## 2017-11-09 MED ORDER — HEPARIN (PORCINE) IN NACL 2-0.9 UNITS/ML: INTRAMUSCULAR | Status: DC | PRN

## 2017-11-09 MED ORDER — VERAPAMIL HCL 2.5 MG/ML IV SOLN
INTRAVENOUS | Status: AC
  Filled 2017-11-09: qty 2

## 2017-11-09 MED ORDER — VERAPAMIL HCL 2.5 MG/ML IV SOLN
INTRA_ARTERIAL | Status: DC | PRN
  Administered 2017-11-09: 10 mL via INTRA_ARTERIAL

## 2017-11-09 MED ORDER — MIDAZOLAM HCL 2 MG/2ML IJ SOLN
INTRAMUSCULAR | Status: DC | PRN
  Administered 2017-11-09: 1 mg via INTRAVENOUS

## 2017-11-09 MED ORDER — HEPARIN (PORCINE) IN NACL 1000-0.9 UT/500ML-% IV SOLN
INTRAVENOUS | Status: DC | PRN
  Administered 2017-11-09 (×2): 500 mL

## 2017-11-09 MED ORDER — IOPAMIDOL (ISOVUE-370) INJECTION 76%
INTRAVENOUS | Status: DC | PRN
  Administered 2017-11-09: 40 mL via INTRAVENOUS

## 2017-11-09 MED ORDER — HEPARIN (PORCINE) IN NACL 1000-0.9 UT/500ML-% IV SOLN
INTRAVENOUS | Status: AC
  Filled 2017-11-09: qty 1000

## 2017-11-09 MED ORDER — ONDANSETRON HCL 4 MG/2ML IJ SOLN
4.0000 mg | Freq: Four times a day (QID) | INTRAMUSCULAR | Status: DC | PRN
Start: 2017-11-09 — End: 2017-11-10

## 2017-11-09 MED ORDER — SODIUM CHLORIDE 0.9 % IV SOLN: INTRAVENOUS | Status: AC

## 2017-11-09 MED ORDER — LIDOCAINE HCL (PF) 1 % IJ SOLN
INTRAMUSCULAR | Status: AC
  Filled 2017-11-09: qty 30

## 2017-11-09 MED ORDER — FENTANYL CITRATE (PF) 100 MCG/2ML IJ SOLN
INTRAMUSCULAR | Status: AC
Start: 2017-11-09 — End: ?
  Filled 2017-11-09: qty 2

## 2017-11-09 MED ORDER — MIDAZOLAM HCL 2 MG/2ML IJ SOLN
INTRAMUSCULAR | Status: AC
  Filled 2017-11-09: qty 2

## 2017-11-09 MED ORDER — HEPARIN SODIUM (PORCINE) 1000 UNIT/ML IJ SOLN
INTRAMUSCULAR | Status: DC | PRN
  Administered 2017-11-09: 5000 [IU] via INTRAVENOUS

## 2017-11-09 MED ORDER — SODIUM CHLORIDE 0.9 % IV SOLN: 250.0000 mL | INTRAVENOUS | Status: DC | PRN

## 2017-11-09 MED ORDER — NITROGLYCERIN 1 MG/10 ML FOR IR/CATH LAB
INTRA_ARTERIAL | Status: AC
  Filled 2017-11-09: qty 10

## 2017-11-09 MED ORDER — ACETAMINOPHEN 325 MG PO TABS: 650.0000 mg | ORAL_TABLET | ORAL | Status: DC | PRN

## 2017-11-09 MED ORDER — LIDOCAINE HCL (PF) 1 % IJ SOLN
INTRAMUSCULAR | Status: DC | PRN
  Administered 2017-11-09: 2 mL

## 2017-11-09 MED ORDER — MORPHINE SULFATE (PF) 2 MG/ML IV SOLN: 2.0000 mg | INTRAVENOUS | Status: DC | PRN

## 2017-11-09 MED ORDER — FENTANYL CITRATE (PF) 100 MCG/2ML IJ SOLN
INTRAMUSCULAR | Status: DC | PRN
  Administered 2017-11-09: 25 ug via INTRAVENOUS

## 2017-11-09 SURGICAL SUPPLY — 11 items

## 2017-11-09 NOTE — H&P (View-Only) (Signed)
Progress Note  Patient Name: Marvin Chandler Date of Encounter: 11/09/2017  Primary Cardiologist: Lauree Chandler, MD   Subjective   No complaints at rest.  Denies chest pain or shortness of breath.  Inpatient Medications    Scheduled Meds: . aspirin  81 mg Oral Pre-Cath  . aspirin EC  81 mg Oral Daily  . atorvastatin  80 mg Oral q1800  . fluticasone  2 spray Each Nare Daily  . loratadine  10 mg Oral Daily  . losartan  50 mg Oral Daily  . mometasone-formoterol  2 puff Inhalation BID  . montelukast  10 mg Oral QHS  . pantoprazole  40 mg Oral Daily  . sodium chloride flush  3 mL Intravenous Q12H  . tiotropium  18 mcg Inhalation Daily   Continuous Infusions: . sodium chloride    . sodium chloride    . heparin 1,550 Units/hr (11/09/17 0500)   PRN Meds: sodium chloride, acetaminophen, albuterol, nitroGLYCERIN, ondansetron (ZOFRAN) IV, sodium chloride flush   Vital Signs    Vitals:   11/08/17 1915 11/08/17 1932 11/08/17 2341 11/09/17 0322  BP:  112/81    Pulse:  65    Resp:  18    Temp:  97.6 F (36.4 C) 98.2 F (36.8 C) 97.9 F (36.6 C)  TempSrc:  Oral Oral Oral  SpO2: 100% 100%    Weight:      Height:        Intake/Output Summary (Last 24 hours) at 11/09/2017 0549 Last data filed at 11/08/2017 1933 Gross per 24 hour  Intake 120 ml  Output 500 ml  Net -380 ml   Filed Weights   11/08/17 1002 11/08/17 1555  Weight: 226 lb (102.5 kg) 224 lb (101.6 kg)    Telemetry    Normal sinus rhythm- Personally Reviewed   Physical Exam  Alert, oriented male in no distress GEN: No acute distress.   Neck: No JVD Cardiac: RRR, no murmurs, rubs, or gallops.  Respiratory: Clear to auscultation bilaterally with prolonged expiration  GI: Soft, nontender, non-distended  MS: No edema; No deformity. Neuro:  Nonfocal  Psych: Normal affect   Labs    Chemistry Recent Labs  Lab 11/08/17 1011 11/09/17 0321  NA 141 140  K 3.5 4.2  CL 108 102  CO2 26 29    GLUCOSE 122* 110*  BUN 10 10  CREATININE 1.18 1.41*  CALCIUM 8.8* 9.1  PROT 5.9*  --   ALBUMIN 3.5  --   AST 17  --   ALT 14  --   ALKPHOS 58  --   BILITOT 0.6  --   GFRNONAA >60 50*  GFRAA >60 58*  ANIONGAP 7 9     Hematology Recent Labs  Lab 11/08/17 1011 11/09/17 0321  WBC 6.3 8.6  RBC 4.40 4.55  HGB 12.9* 13.1  HCT 39.3 40.9  MCV 89.3 89.9  MCH 29.3 28.8  MCHC 32.8 32.0  RDW 13.5 13.5  PLT 207 201    Cardiac Enzymes Recent Labs  Lab 11/08/17 1614 11/08/17 2058 11/09/17 0321  TROPONINI <0.03 <0.03 <0.03    Recent Labs  Lab 11/08/17 1019  TROPIPOC 0.01     BNPNo results for input(s): BNP, PROBNP in the last 168 hours.   DDimer No results for input(s): DDIMER in the last 168 hours.   Radiology    Dg Chest 2 View  Result Date: 11/08/2017 CLINICAL DATA:  Chest pain short of breath EXAM: CHEST - 2 VIEW COMPARISON:  07/25/2014 FINDINGS: Heart size and vascularity normal.  Negative for heart failure. Patchy density in the right middle lobe has progressed. Mild airspace disease in the left lower lobe also has progressed. No effusion. IMPRESSION: Bibasilar atelectasis/infiltrate. Electronically Signed   By: Franchot Gallo M.D.   On: 11/08/2017 10:43   CT chest: IMPRESSION: 1. Increased bandlike scarring or atelectasis in the right middle lobe. There is also mild subsegmental atelectasis in both lower lobes and in the lingula. 2. Aortic Atherosclerosis (ICD10-I70.0) and Emphysema (ICD10-J43.9). Coronary atherosclerosis. 3. Airway thickening is present, suggesting bronchitis or reactive airways disease.  Cardiac Studies   pending  Patient Profile     67 y.o. male with nonischemic cardiomyopathy, paroxysmal atrial fibrillation, HTN, hyperlipidemia, presenting with symptoms of unstable angina  Assessment & Plan    1. Unstable angina pectoris: enzymes negative to date, but symptoms concerning. Favor invasive evaluation with cardiac catheterization and  possible PCI. Specific risks, indication, and alternatives reviewed with the patient who understands and agrees to proceed.   2. Chronic systolic heart failure: no clear volume overload. Could be responsible for some of his symptoms.  BNP is normal at 94.  Continue current Rx.   3. Paroxysmal atrial fibrillation: maintainins sinus rhythm. Xarelto held for cardiac cath.   4. Patchy infiltrate RML: no clear signs of pneumonia with no fever or leukocytosis.  CT chest ordered this morning demonstrating airway thickening and some scarring in the lung parenchyma around the right middle lobe but no clear evidence of pneumonia or other pulmonary process.  Dispo: cath today. Limit dye, check echo for LV function assessment, hold losartan for cath.   For questions or updates, please contact [place] Please consult [email or web address] for contact info under Cardiology/STEMI.      Signed, Sherren Mocha, MD  11/09/2017, 5:49 AM

## 2017-11-09 NOTE — Progress Notes (Signed)
ANTICOAGULATION CONSULT NOTE - Follow-Up Consult  Pharmacy Consult for Heparin Indication: chest pain/ACS  No Known Allergies  Patient Measurements: Height: 5\' 11"  (180.3 cm) Weight: 226 lb 3.2 oz (102.6 kg) IBW/kg (Calculated) : 75.3 Heparin Dosing Weight: 96.6 kg  Vital Signs: Temp: 97.8 F (36.6 C) (07/11 1145) Temp Source: Oral (07/11 1145) BP: 130/69 (07/11 1145) Pulse Rate: 62 (07/11 1145)  Labs: Recent Labs    11/08/17 1011 11/08/17 1614 11/08/17 2058 11/09/17 0321 11/09/17 0327 11/09/17 1128  HGB 12.9*  --   --  13.1  --   --   HCT 39.3  --   --  40.9  --   --   PLT 207  --   --  201  --   --   APTT  --   --   --   --  49* 100*  HEPARINUNFRC  --   --   --   --  0.45  --   CREATININE 1.18  --   --  1.41*  --   --   TROPONINI  --  <0.03 <0.03 <0.03  --   --     Estimated Creatinine Clearance: 61.1 mL/min (A) (by C-G formula based on SCr of 1.41 mg/dL (H)).   Medical History: Past Medical History:  Diagnosis Date  . Arthritis of hip    bilateral  . Asthma   . CAD (coronary artery disease)    LHC 5/16:  Dx ostial 30%, LAD luminal irregs, EF 40%  . Chronic systolic CHF (congestive heart failure) ([place])    a. Echo 3/16:  inf and inf-lat HK, mild LVH, EF 45%, mild to mod LAE, normal RVF  . Cough   . Hiatal hernia   . NICM (nonischemic cardiomyopathy) ([place])   . Osteoarthritis   . Personal history of colonic adenomas 02/20/2013  . Pneumonia   Atrial fibrillation  Medications:  Xarelto PTA 20mg  daily - last dose 11/07/17 at 2200 PM.   Assessment: 67 year old male with significant cardiac history presenting to ED with chest pain to start IV Heparin per pharmacy dosing.  aPTT this afternoon therapeutic but at upper limit of range (aPTT 100, goal of 66-102 seconds). Initial HL falsely elevated at 0.45 (initial aPTT was 49) given recent Xarelto use. CBC wnl, no bleeding noted, no issues with the lab collection or drip per RN. Patient will be going to cath lab  today.  Goal of Therapy:  Heparin level 0.3-0.7 units/ml aPTT 66-102 seconds Monitor platelets by anticoagulation protocol: Yes   Plan:  - Decrease Heparin to 1450 units/hr (14.5 ml/hr) - Will continue to monitor for any signs/symptoms of bleeding and will follow up after patient returns from cath lab.   Thank you for allowing pharmacy to be a part of this patient's care.   Brendolyn Patty, PharmD PGY1 Pharmacy Resident Phone [phone]  11/09/2017   12:14 PM

## 2017-11-09 NOTE — Progress Notes (Signed)
Progress Note  Patient Name: Marvin Chandler Date of Encounter: 11/09/2017  Primary Cardiologist: Lauree Chandler, MD   Subjective   No complaints at rest.  Denies chest pain or shortness of breath.  Inpatient Medications    Scheduled Meds: . aspirin  81 mg Oral Pre-Cath  . aspirin EC  81 mg Oral Daily  . atorvastatin  80 mg Oral q1800  . fluticasone  2 spray Each Nare Daily  . loratadine  10 mg Oral Daily  . losartan  50 mg Oral Daily  . mometasone-formoterol  2 puff Inhalation BID  . montelukast  10 mg Oral QHS  . pantoprazole  40 mg Oral Daily  . sodium chloride flush  3 mL Intravenous Q12H  . tiotropium  18 mcg Inhalation Daily   Continuous Infusions: . sodium chloride    . sodium chloride    . heparin 1,550 Units/hr (11/09/17 0500)   PRN Meds: sodium chloride, acetaminophen, albuterol, nitroGLYCERIN, ondansetron (ZOFRAN) IV, sodium chloride flush   Vital Signs    Vitals:   11/08/17 1915 11/08/17 1932 11/08/17 2341 11/09/17 0322  BP:  112/81    Pulse:  65    Resp:  18    Temp:  97.6 F (36.4 C) 98.2 F (36.8 C) 97.9 F (36.6 C)  TempSrc:  Oral Oral Oral  SpO2: 100% 100%    Weight:      Height:        Intake/Output Summary (Last 24 hours) at 11/09/2017 0549 Last data filed at 11/08/2017 1933 Gross per 24 hour  Intake 120 ml  Output 500 ml  Net -380 ml   Filed Weights   11/08/17 1002 11/08/17 1555  Weight: 226 lb (102.5 kg) 224 lb (101.6 kg)    Telemetry    Normal sinus rhythm- Personally Reviewed   Physical Exam  Alert, oriented male in no distress GEN: No acute distress.   Neck: No JVD Cardiac: RRR, no murmurs, rubs, or gallops.  Respiratory: Clear to auscultation bilaterally with prolonged expiration  GI: Soft, nontender, non-distended  MS: No edema; No deformity. Neuro:  Nonfocal  Psych: Normal affect   Labs    Chemistry Recent Labs  Lab 11/08/17 1011 11/09/17 0321  NA 141 140  K 3.5 4.2  CL 108 102  CO2 26 29    GLUCOSE 122* 110*  BUN 10 10  CREATININE 1.18 1.41*  CALCIUM 8.8* 9.1  PROT 5.9*  --   ALBUMIN 3.5  --   AST 17  --   ALT 14  --   ALKPHOS 58  --   BILITOT 0.6  --   GFRNONAA >60 50*  GFRAA >60 58*  ANIONGAP 7 9     Hematology Recent Labs  Lab 11/08/17 1011 11/09/17 0321  WBC 6.3 8.6  RBC 4.40 4.55  HGB 12.9* 13.1  HCT 39.3 40.9  MCV 89.3 89.9  MCH 29.3 28.8  MCHC 32.8 32.0  RDW 13.5 13.5  PLT 207 201    Cardiac Enzymes Recent Labs  Lab 11/08/17 1614 11/08/17 2058 11/09/17 0321  TROPONINI <0.03 <0.03 <0.03    Recent Labs  Lab 11/08/17 1019  TROPIPOC 0.01     BNPNo results for input(s): BNP, PROBNP in the last 168 hours.   DDimer No results for input(s): DDIMER in the last 168 hours.   Radiology    Dg Chest 2 View  Result Date: 11/08/2017 CLINICAL DATA:  Chest pain short of breath EXAM: CHEST - 2 VIEW COMPARISON:  07/25/2014 FINDINGS: Heart size and vascularity normal.  Negative for heart failure. Patchy density in the right middle lobe has progressed. Mild airspace disease in the left lower lobe also has progressed. No effusion. IMPRESSION: Bibasilar atelectasis/infiltrate. Electronically Signed   By: Franchot Gallo M.D.   On: 11/08/2017 10:43   CT chest: IMPRESSION: 1. Increased bandlike scarring or atelectasis in the right middle lobe. There is also mild subsegmental atelectasis in both lower lobes and in the lingula. 2. Aortic Atherosclerosis (ICD10-I70.0) and Emphysema (ICD10-J43.9). Coronary atherosclerosis. 3. Airway thickening is present, suggesting bronchitis or reactive airways disease.  Cardiac Studies   pending  Patient Profile     67 y.o. male with nonischemic cardiomyopathy, paroxysmal atrial fibrillation, HTN, hyperlipidemia, presenting with symptoms of unstable angina  Assessment & Plan    1. Unstable angina pectoris: enzymes negative to date, but symptoms concerning. Favor invasive evaluation with cardiac catheterization and  possible PCI. Specific risks, indication, and alternatives reviewed with the patient who understands and agrees to proceed.   2. Chronic systolic heart failure: no clear volume overload. Could be responsible for some of his symptoms.  BNP is normal at 94.  Continue current Rx.   3. Paroxysmal atrial fibrillation: maintainins sinus rhythm. Xarelto held for cardiac cath.   4. Patchy infiltrate RML: no clear signs of pneumonia with no fever or leukocytosis.  CT chest ordered this morning demonstrating airway thickening and some scarring in the lung parenchyma around the right middle lobe but no clear evidence of pneumonia or other pulmonary process.  Dispo: cath today. Limit dye, check echo for LV function assessment, hold losartan for cath.   For questions or updates, please contact [place] Please consult [email or web address] for contact info under Cardiology/STEMI.      Signed, Sherren Mocha, MD  11/09/2017, 5:49 AM

## 2017-11-09 NOTE — Progress Notes (Signed)
  Echocardiogram 2D Echocardiogram has been performed.  Marvin Chandler 11/09/2017, 10:53 AM

## 2017-11-09 NOTE — Interval H&P Note (Signed)
Cath Lab Visit (complete for each Cath Lab visit)  Clinical Evaluation Leading to the Procedure:   ACS: No.  Non-ACS:    Anginal Classification: CCS II  Anti-ischemic medical therapy: Minimal Therapy (1 class of medications)  Non-Invasive Test Results: No non-invasive testing performed  Prior CABG: No previous CABG      History and Physical Interval Note:  11/09/2017 2:23 PM  Marvin Chandler  has presented today for surgery, with the diagnosis of unstable angina  The various methods of treatment have been discussed with the patient and family. After consideration of risks, benefits and other options for treatment, the patient has consented to  Procedure(s): LEFT HEART CATH AND CORONARY ANGIOGRAPHY (N/A) as a surgical intervention .  The patient's history has been reviewed, patient examined, no change in status, stable for surgery.  I have reviewed the patient's chart and labs.  Questions were answered to the patient's satisfaction.     Quay Burow

## 2017-11-09 NOTE — Plan of Care (Signed)
Continue current care plan 

## 2017-11-09 NOTE — Progress Notes (Signed)
ANTICOAGULATION CONSULT NOTE - Follow-Up Consult  Pharmacy Consult for Heparin Indication: chest pain/ACS  No Known Allergies  Patient Measurements: Height: 5\' 11"  (180.3 cm) Weight: 224 lb (101.6 kg) IBW/kg (Calculated) : 75.3 Heparin Dosing Weight: 96.6 kg  Vital Signs: Temp: 97.9 F (36.6 C) (07/11 0322) Temp Source: Oral (07/11 0322) BP: 112/81 (07/10 1932) Pulse Rate: 65 (07/10 1932)  Labs: Recent Labs    11/08/17 1011 11/08/17 1614 11/08/17 2058 11/09/17 0321 11/09/17 0327  HGB 12.9*  --   --  13.1  --   HCT 39.3  --   --  40.9  --   PLT 207  --   --  201  --   APTT  --   --   --   --  49*  HEPARINUNFRC  --   --   --   --  0.45  CREATININE 1.18  --   --   --   --   TROPONINI  --  <0.03 <0.03  --   --     Estimated Creatinine Clearance: 72.7 mL/min (by C-G formula based on SCr of 1.18 mg/dL).   Medical History: Past Medical History:  Diagnosis Date  . Arthritis of hip    bilateral  . Asthma   . CAD (coronary artery disease)    LHC 5/16:  Dx ostial 30%, LAD luminal irregs, EF 40%  . Chronic systolic CHF (congestive heart failure) ([place])    a. Echo 3/16:  inf and inf-lat HK, mild LVH, EF 45%, mild to mod LAE, normal RVF  . Cough   . Hiatal hernia   . NICM (nonischemic cardiomyopathy) ([place])   . Osteoarthritis   . Personal history of colonic adenomas 02/20/2013  . Pneumonia   Atrial fibrillation  Medications:  Xarelto PTA 20mg  daily - last dose 11/07/17 at 2200 PM.   Assessment: 67 year old male with significant cardiac history presenting to ED with chest pain to start IV Heparin per pharmacy dosing.  aPTT this morning is SUBtherapeutic (aPTT 49, goal of 66-102 seconds). HL falsely elevated at 0.45 given recent Xarelto use. CBC wnl, no bleeding noted.   Goal of Therapy:  Heparin level 0.3-0.7 units/ml aPTT 66-102 seconds Monitor platelets by anticoagulation protocol: Yes   Plan:  - Increase Heparin to 1550 units/hr (15.5 ml/hr) - Will continue to  monitor for any signs/symptoms of bleeding and will follow up with aPTT level in 6 hours   Thank you for allowing pharmacy to be a part of this patient's care.  Alycia Rossetti, PharmD, BCPS Clinical Pharmacist Pager: [phone] If after 3:30p, please call main pharmacy at: [phone] 11/09/2017 4:49 AM

## 2017-11-10 ENCOUNTER — Encounter (HOSPITAL_COMMUNITY): Payer: Self-pay | Admitting: *Deleted

## 2017-11-10 DIAGNOSIS — R079 Chest pain, unspecified: Secondary | ICD-10-CM

## 2017-11-10 LAB — BASIC METABOLIC PANEL
Anion gap: 6 (ref 5–15)
BUN: 10 mg/dL (ref 8–23)
CO2: 28 mmol/L (ref 22–32)
Calcium: 9.1 mg/dL (ref 8.9–10.3)
Chloride: 106 mmol/L (ref 98–111)
Creatinine, Ser: 1.27 mg/dL — ABNORMAL HIGH (ref 0.61–1.24)
GFR calc Af Amer: >60 mL/min (ref >60)
GFR, EST NON AFRICAN AMERICAN: 56 mL/min — AB (ref >60)
GLUCOSE: 124 mg/dL — AB (ref 70–99)
POTASSIUM: 3.9 mmol/L (ref 3.5–5.1)
Sodium: 140 mmol/L (ref 135–145)

## 2017-11-10 LAB — CBC
HEMATOCRIT: 40.4 % (ref 39.0–52.0)
HEMOGLOBIN: 13 g/dL (ref 13.0–17.0)
MCH: 29.1 pg (ref 26.0–34.0)
MCHC: 32.2 g/dL (ref 30.0–36.0)
MCV: 90.4 fL (ref 78.0–100.0)
PLATELETS: 206 10*3/uL (ref 150–400)
RBC: 4.47 MIL/uL (ref 4.22–5.81)
RDW: 13.5 % (ref 11.5–15.5)
WBC: 7.3 10*3/uL (ref 4.0–10.5)

## 2017-11-10 MED ORDER — NITROGLYCERIN 0.4 MG SL SUBL: 0.4000 mg | SUBLINGUAL_TABLET | SUBLINGUAL | 3 refills | Status: AC | PRN

## 2017-11-10 NOTE — Care Management Note (Signed)
Case Management Note Marvetta Gibbons RN, BSN Unit 4E-Case Manager-- Cross Coverage 2C [phone]  Patient Details  Name: WEAVER TWEED MRN: [id number] Date of Birth: [birth year]  Subjective/Objective:   Pt admitted with [place]                 Action/Plan: PTA pt lived at home- notified by April at [facility]- pt is followed at the [facility] clinic- PCP- Brun[place]. CSW at the clinic is Glean Hess at [phone])   Expected Discharge Date:  11/10/17               Expected Discharge Plan:  Home/Self Care  In-House Referral:     Discharge planning Services  CM Consult  Post Acute Care Choice:  NA Choice offered to:  NA  DME Arranged:    DME Agency:     HH Arranged:    HH Agency:     Status of Service:  Completed, signed off  If discussed at [employer] of Stay Meetings, dates discussed:    Additional Comments:  Dawayne Patricia, RN 11/10/2017, 11:09 AM

## 2017-11-10 NOTE — Progress Notes (Addendum)
Progress Note  Patient Name: Marvin Chandler Date of Encounter: 11/10/2017  Primary Cardiologist: Lauree Chandler, MD   Subjective   Doing ok this morning. No recurrent CP or dyspnea. Radial cath site is stable.   Inpatient Medications    Scheduled Meds: . aspirin EC  81 mg Oral Daily  . atorvastatin  80 mg Oral q1800  . fluticasone  2 spray Each Nare Daily  . loratadine  10 mg Oral Daily  . losartan  50 mg Oral Daily  . mometasone-formoterol  2 puff Inhalation BID  . montelukast  10 mg Oral QHS  . pantoprazole  40 mg Oral Daily  . sodium chloride flush  3 mL Intravenous Q12H  . tiotropium  18 mcg Inhalation Daily   Continuous Infusions: . sodium chloride     PRN Meds: sodium chloride, acetaminophen, albuterol, morphine injection, nitroGLYCERIN, ondansetron (ZOFRAN) IV, ondansetron (ZOFRAN) IV, sodium chloride flush   Vital Signs    Vitals:   11/09/17 1606 11/09/17 2023 11/10/17 0453 11/10/17 0454  BP: 139/71 (!) 152/78 132/77   Pulse: 70 72 70   Resp:      Temp:  98 F (36.7 C) 98.2 F (36.8 C)   TempSrc:  Oral Oral   SpO2: 98% 99% 96%   Weight:    227 lb 14.4 oz (103.4 kg)  Height:        Intake/Output Summary (Last 24 hours) at 11/10/2017 0650 Last data filed at 11/09/2017 2134 Gross per 24 hour  Intake 495.25 ml  Output -  Net 495.25 ml   Filed Weights   11/08/17 1555 11/09/17 0400 11/10/17 0454  Weight: 224 lb (101.6 kg) 226 lb 3.2 oz (102.6 kg) 227 lb 14.4 oz (103.4 kg)    Telemetry    NSR - Personally Reviewed  ECG    SR w/ 1st degree AVB 70 bpm - Personally Reviewed  Physical Exam   GEN: No acute distress.   Neck: No JVD Cardiac: RRR, no murmurs, rubs, or gallops.  Respiratory: Clear to auscultation bilaterally. GI: Soft, nontender, non-distended  MS: No edema; No deformity. Neuro:  Nonfocal  Psych: Normal affect   Labs    Chemistry Recent Labs  Lab 11/08/17 1011 11/09/17 0321 11/10/17 0324  NA 141 140 140  K 3.5  4.2 3.9  CL 108 102 106  CO2 26 29 28   GLUCOSE 122* 110* 124*  BUN 10 10 10   CREATININE 1.18 1.41* 1.27*  CALCIUM 8.8* 9.1 9.1  PROT 5.9*  --   --   ALBUMIN 3.5  --   --   AST 17  --   --   ALT 14  --   --   ALKPHOS 58  --   --   BILITOT 0.6  --   --   GFRNONAA >60 50* 56*  GFRAA >60 58* >60  ANIONGAP 7 9 6      Hematology Recent Labs  Lab 11/08/17 1011 11/09/17 0321 11/10/17 0324  WBC 6.3 8.6 7.3  RBC 4.40 4.55 4.47  HGB 12.9* 13.1 13.0  HCT 39.3 40.9 40.4  MCV 89.3 89.9 90.4  MCH 29.3 28.8 29.1  MCHC 32.8 32.0 32.2  RDW 13.5 13.5 13.5  PLT 207 201 206    Cardiac Enzymes Recent Labs  Lab 11/08/17 1614 11/08/17 2058 11/09/17 0321  TROPONINI <0.03 <0.03 <0.03    Recent Labs  Lab 11/08/17 1019  TROPIPOC 0.01     BNP Recent Labs  Lab 11/09/17 0327  BNP 94.4     DDimer No results for input(s): DDIMER in the last 168 hours.   Radiology    Dg Chest 2 View  Result Date: 11/08/2017 CLINICAL DATA:  Chest pain short of breath EXAM: CHEST - 2 VIEW COMPARISON:  07/25/2014 FINDINGS: Heart size and vascularity normal.  Negative for heart failure. Patchy density in the right middle lobe has progressed. Mild airspace disease in the left lower lobe also has progressed. No effusion. IMPRESSION: Bibasilar atelectasis/infiltrate. Electronically Signed   By: Franchot Gallo M.D.   On: 11/08/2017 10:43   Ct Chest Wo Contrast  Result Date: 11/09/2017 CLINICAL DATA:  Substernal moderate chest pain.  Duration: 2 days EXAM: CT CHEST WITHOUT CONTRAST TECHNIQUE: Multidetector CT imaging of the chest was performed following the standard protocol without IV contrast. COMPARISON:  Chest radiograph 11/08/2017 and chest CT from 07/17/2014 FINDINGS: Cardiovascular: Coronary, aortic arch, and branch vessel atherosclerotic vascular disease. Mild cardiomegaly. Mediastinum/Nodes: Unremarkable Lungs/Pleura: Centrilobular and paraseptal emphysema. Bandlike scarring or atelectasis in the right  middle lobe especially laterally, mildly worsened compared to 07/17/2014. There is also bandlike subsegmental atelectasis in both lower lobes. Airway thickening is present, suggesting bronchitis or reactive airways disease. Mild lingular atelectasis or scarring. Upper Abdomen: Unremarkable Musculoskeletal: Mild lower thoracic spondylosis. IMPRESSION: 1. Increased bandlike scarring or atelectasis in the right middle lobe. There is also mild subsegmental atelectasis in both lower lobes and in the lingula. 2. Aortic Atherosclerosis (ICD10-I70.0) and Emphysema (ICD10-J43.9). Coronary atherosclerosis. 3. Airway thickening is present, suggesting bronchitis or reactive airways disease. Electronically Signed   By: Van Clines M.D.   On: 11/09/2017 09:46    Cardiac Studies   Cardiac Catheterization 11/09/17 IMPRESSION: Marvin Chandler has noncritical CAD with minimal disease in his LAD.  He is filling pressures only mildly elevated.  These are similar to the findings demonstrated by right and left heart cath performed by Dr. Angelena Form 08/18/2014.  I believe his pain is noncardiac.  Medical therapy will be recommended.   2D Echo 11/09/17 Study Conclusions  - Left ventricle: LVEF is approximately 35 to 40% with diffuse   hypokinesis, worse in the inferior and posterior walls. Compared   to echo from 2016, LVEF is a little more depressed. The cavity   size was normal. Wall thickness was increased in a pattern of   mild LVH.  Patient Profile     67 y.o. male with nonischemic cardiomyopathy, paroxysmal atrial fibrillation, HTN, hyperlipidemia, presenting with symptoms concerning for unstable angina, however diagnostic cardiac cath this admission showed non critical CAD.   Assessment & Plan    1. Chest Pain: felt to be noncardiac, after definitive cath yesterday showed non critical CAD with only minimal disease in the LAD. Echo with reduced EF but no significant valvular abnormalities. No further cardiac w/u.    2. Chronic Systolic HF/NICM: EF 51-88% by echo. Cardiac cath 11/09/17 showed noncritical CAD. Continue medical therapy. Appears euvolemic on edam.   3. PAF: maintaining NSR. Resume Xarelto now that invasive procedures have been completed.   4. Patchy Infiltrate RML: no clear signs of pneumonia with no fever or leukocytosis.  CT chest ordered this morning demonstrating airway thickening and some scarring in the lung parenchyma around the right middle lobe but no clear evidence of pneumonia or other pulmonary process.  For questions or updates, please contact [place] Please consult [email or web address] for contact info under Cardiology/STEMI.     Signed, Lyda Jester, PA-C  11/10/2017, 6:50 AM    Patient seen,  examined. Available data reviewed. Agree with findings, assessment, and plan as outlined by Lyda Jester, PA-C. On my exam: Vitals:   11/09/17 2023 11/10/17 0453  BP: (!) 152/78 132/77  Pulse: 72 70  Resp:    Temp: 98 F (36.7 C) 98.2 F (36.8 C)  SpO2: 99% 96%   Pt is alert and oriented, NAD HEENT: normal Neck: JVP - normal Lungs: CTA bilaterally CV: RRR without murmur or gallop Abd: soft, NT, Positive BS, no hepatomegaly Ext: no C/C/E, distal pulses intact and equal Skin: warm/dry no rash  Cath findings reviewed. No clear explanation for his symptoms. With nonspecific CT findings, recommend outpatient pulmonary evaluation with PFT's. Also recommend exercise/weight loss program and discussed lifestyle modification with him. Continue current cardiac medications for his cardiomyopathy. EKG's reviewed and I think too much conduction disease/AV block to allow for a beta-blocker.   Sherren Mocha, M.D. 11/10/2017 8:03 AM

## 2017-11-10 NOTE — Discharge Summary (Signed)
Discharge Summary    Patient ID: Marvin Chandler,  MRN: [id number], DOB/AGE: Sep 10, 1950 67 y.o.  Admit date: 11/08/2017 Discharge date: 11/10/2017  Primary Care Provider: [facility], [facility] Primary Cardiologist: Lauree Chandler, MD  Discharge Diagnoses    Active Problems:   Non-cardiac chest pain   Allergies No Known Allergies  Diagnostic Studies/Procedures    Cardiac Catheterization 11/09/17 IMPRESSION:Marvin Chandler has noncritical CAD with minimal disease in his LAD. He is filling pressures only mildly elevated. These are similar to the findings demonstrated by right and left heart cath performed by Dr. Angelena Form 08/18/2014. I believe his pain is noncardiac. Medical therapy will be recommended.   2D Echo 11/09/17 Study Conclusions  - Left ventricle: LVEF is approximately 35 to 40% with diffuse hypokinesis, worse in the inferior and posterior walls. Compared to echo from 2016, LVEF is a little more depressed. The cavity size was normal. Wall thickness was increased in a pattern of mild LVH.    History of Present Illness     Marvin Chandler is a 67 y.o. male with a history of non-obstructive CAD, non-ischemic cardiomyopathy (EF 45% on last echo 2016), HTN, HLD, asthma and PAF on Xarelto, who was sent to [facility] ED from the [place], on 11/08/17 for evaluation of chest pain.  Marvin Chandler reported intermittent chest pressure for the past 2 months, worsening over the past 1-2 weeks. He states chest pressure worsens with activity like yard work or going up a flight of stairs, and is relieved with rest and occasional SL nitro use. He reports using SL nitro 1x per week for the last 2 months and at least 4 times in the last week. He reports associated SOB when CP occurs. He also reports increasing fatigue for the past 2 months and decreased exercise tolerance. He reports over the past 1-2 weeks the level of activity required to bring on symptoms has decreased. He presented to  his PCP office today and reported CP. He received 1 SL nitro at the [place] office with some improvement in CP. Decision was made to transfer patient to [facility] ED. He received a 2nd SL nitro by EMS en route to the ED which resolved his symptoms.    ED course: VSS. Labs notable for K 3.5, Cr 1.18 (baseline), Hgb 12.9, PLT 2017, and trop negative x1. CXR with bibasilar atelectasis/infiltrate. EKG with rate 58, second degree AV block type 1, isolated TWI III, submm STE in V2-3. Cardiology asked to admit and evaluate patient for chest pain.   Hospital Course     Marvin Chandler was admitted to telemetry. Cardiac enzymes were cycled and negative x 3. BNP was normal at 94.4. Chest CT showed airway thickening and some scarring in the lung parenchyma around the right middle lobe but no clear evidence of pneumonia or other pulmonary process. No fever or leukocytosis. Definitive cardiac cath was recommended. Xarelto was held. Cath was performed by Dr. Gwenlyn Found on 11/09/17 and showed non critical CAD with only minimal disease in the LAD. Echo with reduced EF but no significant valvular abnormalities. His CP was felt to be noncardiac. Continued medical therapy was recommended and risk factor reduction recommended. No further cardiac w/u indicated. However, Dr. Burt Knack recommended outpatient pulmonary evaluation with PFT's. This was discussed with patient who wished to have study completed at the [place]. Order was provided to patient. Exercise/ weight loss and lifestyle modification also recommended. Marvin Chandler was monitored post cath and had no complications. Radial cath site remained stable as  did renal function. Xarelto was resumed. Marvin Chandler was last seen and examined by Dr. Burt Knack, who determined he was stable for discharge home. Post hospital f/u will be arranged.   Consultants: none   Discharge Vitals Blood pressure 132/77, pulse 70, temperature 98.2 F (36.8 C), temperature source Oral, resp. rate 18, height 5\' 11"  (1.803 m), weight 227 lb 14.4 oz (103.4  kg), SpO2 96 %.  Filed Weights   11/08/17 1555 11/09/17 0400 11/10/17 0454  Weight: 224 lb (101.6 kg) 226 lb 3.2 oz (102.6 kg) 227 lb 14.4 oz (103.4 kg)    Labs & Radiologic Studies    CBC Recent Labs    11/08/17 1011 11/09/17 0321 11/10/17 0324  WBC 6.3 8.6 7.3  NEUTROABS 3.7  --   --   HGB 12.9* 13.1 13.0  HCT 39.3 40.9 40.4  MCV 89.3 89.9 90.4  PLT 207 201 941   Basic Metabolic Panel Recent Labs    11/09/17 0321 11/10/17 0324  NA 140 140  K 4.2 3.9  CL 102 106  CO2 29 28  GLUCOSE 110* 124*  BUN 10 10  CREATININE 1.41* 1.27*  CALCIUM 9.1 9.1   Liver Function Tests Recent Labs    11/08/17 1011  AST 17  ALT 14  ALKPHOS 58  BILITOT 0.6  PROT 5.9*  ALBUMIN 3.5   No results for input(s): LIPASE, AMYLASE in the last 72 hours. Cardiac Enzymes Recent Labs    11/08/17 1614 11/08/17 2058 11/09/17 0321  TROPONINI <0.03 <0.03 <0.03   BNP Invalid input(s): POCBNP D-Dimer No results for input(s): DDIMER in the last 72 hours. Hemoglobin A1C Recent Labs    11/09/17 0327  HGBA1C 6.5*   Fasting Lipid Panel Recent Labs    11/09/17 0321  CHOL 122  HDL 32*  LDLCALC 71  TRIG 96  CHOLHDL 3.8   Thyroid Function Tests No results for input(s): TSH, T4TOTAL, T3FREE, THYROIDAB in the last 72 hours.  Invalid input(s): FREET3 _____________  Dg Chest 2 View  Result Date: 11/08/2017 CLINICAL DATA:  Chest pain short of breath EXAM: CHEST - 2 VIEW COMPARISON:  07/25/2014 FINDINGS: Heart size and vascularity normal.  Negative for heart failure. Patchy density in the right middle lobe has progressed. Mild airspace disease in the left lower lobe also has progressed. No effusion. IMPRESSION: Bibasilar atelectasis/infiltrate. Electronically Signed   By: Franchot Gallo M.D.   On: 11/08/2017 10:43   Ct Chest Wo Contrast  Result Date: 11/09/2017 CLINICAL DATA:  Substernal moderate chest pain.  Duration: 2 days EXAM: CT CHEST WITHOUT CONTRAST TECHNIQUE: Multidetector CT  imaging of the chest was performed following the standard protocol without IV contrast. COMPARISON:  Chest radiograph 11/08/2017 and chest CT from 07/17/2014 FINDINGS: Cardiovascular: Coronary, aortic arch, and branch vessel atherosclerotic vascular disease. Mild cardiomegaly. Mediastinum/Nodes: Unremarkable Lungs/Pleura: Centrilobular and paraseptal emphysema. Bandlike scarring or atelectasis in the right middle lobe especially laterally, mildly worsened compared to 07/17/2014. There is also bandlike subsegmental atelectasis in both lower lobes. Airway thickening is present, suggesting bronchitis or reactive airways disease. Mild lingular atelectasis or scarring. Upper Abdomen: Unremarkable Musculoskeletal: Mild lower thoracic spondylosis. IMPRESSION: 1. Increased bandlike scarring or atelectasis in the right middle lobe. There is also mild subsegmental atelectasis in both lower lobes and in the lingula. 2. Aortic Atherosclerosis (ICD10-I70.0) and Emphysema (ICD10-J43.9). Coronary atherosclerosis. 3. Airway thickening is present, suggesting bronchitis or reactive airways disease. Electronically Signed   By: Van Clines M.D.   On: 11/09/2017 09:46   Disposition  Marvin Chandler is being discharged home today in good condition.  Follow-up Plans & Appointments    Follow-up Information    Burnell Blanks, MD Follow up.   Specialty:  Cardiology Why:  our office will call you with a hospital follow-up visit  Contact information: [place]. [address] [place] [address] [phone]          Discharge Instructions    Diet - low sodium heart healthy   Complete by:  As directed    Increase activity slowly   Complete by:  As directed       Discharge Medications   Allergies as of 11/10/2017   No Known Allergies     Medication List    TAKE these medications   albuterol (2.5 MG/3ML) 0.083% nebulizer solution Commonly known as:  PROVENTIL Take 3 mLs (2.5 mg total) by  nebulization every 6 (six) hours as needed for wheezing or shortness of breath.   albuterol 108 (90 Base) MCG/ACT inhaler Commonly known as:  VENTOLIN HFA Inhale 2 puffs into the lungs every 4 (four) hours as needed.   budesonide-formoterol 160-4.5 MCG/ACT inhaler Commonly known as:  SYMBICORT Inhale 2 puffs into the lungs daily.   cetirizine 10 MG tablet Commonly known as:  ZYRTEC Take 10 mg by mouth daily.   EPINEPHrine 0.3 mg/0.3 mL Soaj injection Commonly known as:  EPI-PEN Inject 0.3 mLs (0.3 mg total) into the muscle once.   fluticasone 50 MCG/ACT nasal spray Commonly known as:  FLONASE Place 2 sprays into both nostrils daily.   furosemide 40 MG tablet Commonly known as:  LASIX Take 1 tablet (40 mg total) by mouth 2 (two) times daily.   ibuprofen 200 MG tablet Commonly known as:  ADVIL,MOTRIN Take 400 mg by mouth every 6 (six) hours as needed for moderate pain.   losartan 50 MG tablet Commonly known as:  COZAAR Take 1 tablet (50 mg total) by mouth daily.   montelukast 10 MG tablet Commonly known as:  SINGULAIR TAKE 1 TABLET BY MOUTH AT BEDTIME   Nebulizers Misc by Does not apply route 4 (four) times daily. Use with nebulizer four times a day as needed for wheezing or sob   nitroGLYCERIN 0.4 MG SL tablet Commonly known as:  NITROSTAT Place 1 tablet (0.4 mg total) under the tongue every 5 (five) minutes as needed.   omeprazole 40 MG capsule Commonly known as:  PRILOSEC Take 1 capsule by mouth every morning on an empty stomach. Take 30 minutes before eating.   pravastatin 80 MG tablet Commonly known as:  PRAVACHOL Take 1 tablet (80 mg total) by mouth every evening.   rivaroxaban 20 MG Tabs tablet Commonly known as:  XARELTO Take 1 tablet (20 mg total) by mouth daily with supper.   sildenafil 50 MG tablet Commonly known as:  VIAGRA Take 1 tablet (50 mg total) by mouth daily as needed for erectile dysfunction.   SPIRIVA HANDIHALER 18 MCG inhalation  capsule Generic drug:  tiotropium INHALE THE CONTENTS OF 1 CAPSULE VIA HANDIHALER BY MOUTH DAILY        Acute coronary syndrome (MI, NSTEMI, STEMI, etc) this admission?: No.    Outstanding Labs/Studies   PFTs recommended (Marvin Chandler to get completed at the [facility])  Duration of Discharge Encounter   Greater than 30 minutes including physician time.  Signed, Lyda Jester, PA-C 11/10/2017, 1:51 PM

## 2017-11-10 NOTE — Consult Note (Signed)
            [facility] CM Primary Care Navigator  11/10/2017  CHARLTON BOULE Sep 24, 1950 [id number]   Wentto see patient at the bedside to identify possible discharge needs but he was alreadydischargedhomeper staff.  PerMD note,patientwas sent to [facility] ED from the [place], on 11/08/17 for evaluation of chest pain. (Non-cardiac chest pain)  Per chart review, patient is followed at the [facility] by primary care provider (PCP) - [place].  Patient hasdischarge instruction to follow-up withCardiology and their office will call patient for hospital follow-up visit schedule.    For additional questions please contact:  Edwena Felty A. Tasean Mancha, BSN, RN-BC [facility] PRIMARY CARE Navigator Cell: [phone]

## 2017-11-10 NOTE — Progress Notes (Signed)
Discharge instructions (including medications) discussed with and copy provided to patient/caregiver. All belongings sent with patient. 

## 2017-11-29 ENCOUNTER — Other Ambulatory Visit: Payer: Self-pay

## 2018-05-31 ENCOUNTER — Encounter: Payer: Self-pay | Admitting: Internal Medicine

## 2019-05-11 ENCOUNTER — Encounter (HOSPITAL_COMMUNITY): Payer: Self-pay | Admitting: Pharmacy Technician

## 2019-05-11 ENCOUNTER — Other Ambulatory Visit: Payer: Self-pay | Admitting: Infectious Diseases

## 2019-05-11 ENCOUNTER — Other Ambulatory Visit: Payer: Self-pay

## 2019-05-11 ENCOUNTER — Emergency Department (HOSPITAL_COMMUNITY): Payer: No Typology Code available for payment source

## 2019-05-11 ENCOUNTER — Emergency Department (HOSPITAL_COMMUNITY)
Admission: EM | Admit: 2019-05-11 | Discharge: 2019-05-11 | Disposition: A | Discharge status: Home / Self Care | Payer: No Typology Code available for payment source | Attending: Emergency Medicine | Admitting: Emergency Medicine

## 2019-05-11 ENCOUNTER — Telehealth: Payer: Self-pay | Admitting: Infectious Diseases

## 2019-05-11 DIAGNOSIS — Z96643 Presence of artificial hip joint, bilateral: Secondary | ICD-10-CM | POA: Insufficient documentation

## 2019-05-11 DIAGNOSIS — Z7901 Long term (current) use of anticoagulants: Secondary | ICD-10-CM | POA: Insufficient documentation

## 2019-05-11 DIAGNOSIS — U071 COVID-19: Secondary | ICD-10-CM | POA: Insufficient documentation

## 2019-05-11 DIAGNOSIS — Z87891 Personal history of nicotine dependence: Secondary | ICD-10-CM | POA: Diagnosis not present

## 2019-05-11 DIAGNOSIS — J1282 Pneumonia due to coronavirus disease 2019: Secondary | ICD-10-CM | POA: Diagnosis not present

## 2019-05-11 DIAGNOSIS — J45909 Unspecified asthma, uncomplicated: Secondary | ICD-10-CM | POA: Insufficient documentation

## 2019-05-11 DIAGNOSIS — I5022 Chronic systolic (congestive) heart failure: Secondary | ICD-10-CM | POA: Insufficient documentation

## 2019-05-11 DIAGNOSIS — Z79899 Other long term (current) drug therapy: Secondary | ICD-10-CM | POA: Diagnosis not present

## 2019-05-11 DIAGNOSIS — I2510 Atherosclerotic heart disease of native coronary artery without angina pectoris: Secondary | ICD-10-CM | POA: Insufficient documentation

## 2019-05-11 DIAGNOSIS — R509 Fever, unspecified: Secondary | ICD-10-CM | POA: Diagnosis present

## 2019-05-11 LAB — CBC WITH DIFFERENTIAL/PLATELET
Abs Immature Granulocytes: 0.02 10*3/uL (ref 0.00–0.07)
Basophils Absolute: 0 10*3/uL (ref 0.0–0.1)
Basophils Relative: 0 %
Eosinophils Absolute: 0 10*3/uL (ref 0.0–0.5)
Eosinophils Relative: 0 %
HCT: 45.8 % (ref 39.0–52.0)
Hemoglobin: 15.1 g/dL (ref 13.0–17.0)
Immature Granulocytes: 0 %
Lymphocytes Relative: 20 %
Lymphs Abs: 1.2 10*3/uL (ref 0.7–4.0)
MCH: 29 pg (ref 26.0–34.0)
MCHC: 33 g/dL (ref 30.0–36.0)
MCV: 87.9 fL (ref 80.0–100.0)
Monocytes Absolute: 0.4 10*3/uL (ref 0.1–1.0)
Monocytes Relative: 6 %
Neutro Abs: 4.3 10*3/uL (ref 1.7–7.7)
Neutrophils Relative %: 74 %
Platelets: 154 10*3/uL (ref 150–400)
RBC: 5.21 MIL/uL (ref 4.22–5.81)
RDW: 13.2 % (ref 11.5–15.5)
WBC: 5.9 10*3/uL (ref 4.0–10.5)
nRBC: 0 % (ref 0.0–0.2)

## 2019-05-11 LAB — BASIC METABOLIC PANEL
Anion gap: 11 (ref 5–15)
BUN: 11 mg/dL (ref 8–23)
CO2: 24 mmol/L (ref 22–32)
Calcium: 9.1 mg/dL (ref 8.9–10.3)
Chloride: 102 mmol/L (ref 98–111)
Creatinine, Ser: 1.45 mg/dL — ABNORMAL HIGH (ref 0.61–1.24)
GFR calc Af Amer: 56 mL/min — ABNORMAL LOW (ref >60)
GFR calc non Af Amer: 48 mL/min — ABNORMAL LOW (ref >60)
Glucose, Bld: 122 mg/dL — ABNORMAL HIGH (ref 70–99)
Potassium: 4.8 mmol/L (ref 3.5–5.1)
Sodium: 137 mmol/L (ref 135–145)

## 2019-05-11 MED ORDER — ACETAMINOPHEN 325 MG PO TABS
650.0000 mg | ORAL_TABLET | Freq: Once | ORAL | Status: AC
  Administered 2019-05-11: 650 mg via ORAL
  Filled 2019-05-11: qty 2

## 2019-05-11 NOTE — ED Triage Notes (Signed)
Pt arrives with c/o shob. Dx with covid this last week. States shob has been worsening since Tuesday. Pt talking in complete sentences. NAD.

## 2019-05-11 NOTE — ED Notes (Addendum)
Pt ambulated in the room at room air. Pt felt short of breath and the SPO2 monitor show Hypoxia (86%) after a 48ft walked. After walk pt rest, oxygen came back to 100 with breathing exercise.

## 2019-05-11 NOTE — ED Notes (Signed)
Patient Alert and oriented to baseline. Stable and ambulatory to baseline. Patient verbalized understanding of the discharge instructions.  Patient belongings were taken by the patient.   

## 2019-05-11 NOTE — Telephone Encounter (Signed)
Called to discuss with patient about Covid symptoms and the use of bamlanivimab, a monoclonal antibody infusion for those with mild to moderate Covid symptoms and at a high risk of hospitalization.  Pt is qualified for this infusion at the [facility] infusion center due to Age > 53 and Hypertension.  He developed headaches, runny nose and cough.   He had symptoms start 7 days ago. He was seen in the ER and referred to hotline. There was mention about "transient hypoxia" briefly that normalized. I explained and emphasized to him that if he shows up to the clinic with hypoxia he would not be a candidate and likely referred to [facility] ER for care. He understands.  His wife is having a cough but resistant to get tested. I presume she is also with COVID19 infection and would recommend her to get tested and consider infusion for her as well.

## 2019-05-11 NOTE — Discharge Instructions (Addendum)
Please call the infusion clinic at [phone] if you have any questions Continue to monitor your breathing and return if you are worsening

## 2019-05-11 NOTE — ED Provider Notes (Signed)
[facility] EMERGENCY DEPARTMENT Provider Note   CSN: [id number] Arrival date & time: 05/11/19  [id number]     History Chief Complaint  Patient presents with  . Covid+/SOB    Marvin Chandler is a 69 y.o. male with CAD, A.fib on Xarelto, CHF who presents with SOB. He states he started to have symptoms of COVID a week ago. He was tested 5 days ago and 2 days ago received a positive result. He reports intermittent fevers, a mild frontal headache, cough with hemoptysis, and worsening SOB. The SOB has been gradually worsening over the past 2 days. Last night he was breathing very fast and he decided to come to the ED today. He has not been able to check his sats at home. He denies chest pain.   HPI     Past Medical History:  Diagnosis Date  . Arthritis of hip    bilateral  . Asthma   . CAD (coronary artery disease)    LHC 5/16:  Dx ostial 30%, LAD luminal irregs, EF 40%  . Chronic systolic CHF (congestive heart failure) ([place])    a. Echo 3/16:  inf and inf-lat HK, mild LVH, EF 45%, mild to mod LAE, normal RVF  . Cough   . Hiatal hernia   . NICM (nonischemic cardiomyopathy) ([place])   . Osteoarthritis   . Personal history of colonic adenomas 02/20/2013  . Pneumonia     Patient Active Problem List   Diagnosis Date Noted  . Non-cardiac chest pain 11/08/2017  . Asthma with acute exacerbation 10/06/2014  . History of asthma 08/15/2014  . Hemoptysis 08/15/2014  . Chronic cough 08/15/2014  . Cardiomyopathy ([place]) 08/13/2014  . Asthma exacerbation 07/16/2014  . Pleuritic chest pain 07/16/2014  . Asthma, chronic 02/19/2014  . Personal history of colonic adenomas 02/20/2013  . Dyspnea 12/16/2011  . Chest pain 12/16/2011  . HIATAL HERNIA 07/11/2006  . OSTEOARTHRITIS 07/11/2006  . ARTHRITIS, HIPS, BILATERAL 07/11/2006    Past Surgical History:  Procedure Laterality Date  . COLONOSCOPY  02/13/13  . LEFT AND RIGHT HEART CATHETERIZATION WITH CORONARY ANGIOGRAM N/A  08/18/2014   Procedure: LEFT AND RIGHT HEART CATHETERIZATION WITH CORONARY ANGIOGRAM;  Surgeon: [name], MD;  Location: [facility] CATH LAB;  Service: Cardiovascular;  Laterality: N/A;  . LEFT HEART CATH AND CORONARY ANGIOGRAPHY N/A 11/09/2017   Procedure: LEFT HEART CATH AND CORONARY ANGIOGRAPHY;  Surgeon: [name], MD;  Location: [facility] [facility] CV LAB;  Service: Cardiovascular;  Laterality: N/A;  . TOTAL HIP ARTHROPLASTY  2002   bilateral       Family History  Problem Relation Age of Onset  . Dementia Mother   . Cancer Unknown   . Stroke Cousin   . Cancer Maternal Aunt   . Diabetes Cousin   . Colon cancer Neg Hx   . Esophageal cancer Neg Hx   . Heart attack Neg Hx   . Hypertension Neg Hx     Social History   Tobacco Use  . Smoking status: Former Smoker    Packs/day: 1.00    Years: 7.00    Pack years: 7.00    Types: Cigarettes    Quit date: 11/05/1997    Years since quitting: 21.5  . Smokeless tobacco: Never Used  Substance Use Topics  . Alcohol use: No    Alcohol/week: 0.0 standard drinks  . Drug use: No    Home Medications Prior to Admission medications   Medication Sig Start Date End Date  Taking? Authorizing Provider  albuterol (PROVENTIL) (2.5 MG/3ML) 0.083% nebulizer solution Take 3 mLs (2.5 mg total) by nebulization every 6 (six) hours as needed for wheezing or shortness of breath. 07/22/14   Theodis Blaze, MD  albuterol (VENTOLIN HFA) 108 (90 Base) MCG/ACT inhaler Inhale 2 puffs into the lungs every 4 (four) hours as needed. 06/13/16   Brand Males, MD  budesonide-formoterol (SYMBICORT) 160-4.5 MCG/ACT inhaler Inhale 2 puffs into the lungs daily.    [provider]  cetirizine (ZYRTEC) 10 MG tablet Take 10 mg by mouth daily.    [provider]  EPINEPHrine 0.3 mg/0.3 mL IJ SOAJ injection Inject 0.3 mLs (0.3 mg total) into the muscle once. 08/05/15   Deneise Lever, MD  fluticasone (FLONASE) 50 MCG/ACT nasal spray Place 2 sprays into  both nostrils daily. 07/11/16   Brand Males, MD  furosemide (LASIX) 40 MG tablet Take 1 tablet (40 mg total) by mouth 2 (two) times daily. 06/22/16   Burnell Blanks, MD  ibuprofen (ADVIL,MOTRIN) 200 MG tablet Take 400 mg by mouth every 6 (six) hours as needed for moderate pain.    [provider]  losartan (COZAAR) 50 MG tablet Take 1 tablet (50 mg total) by mouth daily. 06/22/16   Burnell Blanks, MD  montelukast (SINGULAIR) 10 MG tablet TAKE 1 TABLET BY MOUTH AT BEDTIME 08/02/16   Brand Males, MD  Nebulizers MISC by Does not apply route 4 (four) times daily. Use with nebulizer four times a day as needed for wheezing or sob    [provider]  nitroGLYCERIN (NITROSTAT) 0.4 MG SL tablet Place 1 tablet (0.4 mg total) under the tongue every 5 (five) minutes as needed. 11/10/17   Lyda Jester M, PA-C  omeprazole (PRILOSEC) 40 MG capsule Take 1 capsule by mouth every morning on an empty stomach. Take 30 minutes before eating. 12/31/13   Barton Fanny, MD  pravastatin (PRAVACHOL) 80 MG tablet Take 1 tablet (80 mg total) by mouth every evening. 06/24/16   Burnell Blanks, MD  rivaroxaban (XARELTO) 20 MG TABS tablet Take 1 tablet (20 mg total) by mouth daily with supper. 06/22/16   Burnell Blanks, MD  sildenafil (VIAGRA) 50 MG tablet Take 1 tablet (50 mg total) by mouth daily as needed for erectile dysfunction. 12/18/14   Burnell Blanks, MD  SPIRIVA HANDIHALER 18 MCG inhalation capsule INHALE THE CONTENTS OF 1 CAPSULE VIA HANDIHALER BY MOUTH DAILY 06/09/16   Brand Males, MD    Allergies    Patient has no known allergies.  Review of Systems   Review of Systems  Constitutional: Negative for chills and fever.  Respiratory: Positive for cough and shortness of breath.   Cardiovascular: Negative for chest pain.  All other systems reviewed and are negative.   Physical Exam Updated Vital Signs BP 115/77   Pulse 97   Temp (!)  100.8 F (38.2 C) (Oral)   Resp (!) 26   SpO2 96%   Physical Exam Vitals and nursing note reviewed.  Constitutional:      General: He is not in acute distress.    Appearance: Normal appearance. He is well-developed. He is not ill-appearing.     Comments: Calm and cooperative  HENT:     Head: Normocephalic and atraumatic.     Mouth/Throat:     Mouth: Mucous membranes are moist.  Eyes:     General: No scleral icterus.       Right eye: No discharge.  Left eye: No discharge.     Conjunctiva/sclera: Conjunctivae normal.     Pupils: Pupils are equal, round, and reactive to light.  Cardiovascular:     Rate and Rhythm: Normal rate and regular rhythm.  Pulmonary:     Effort: Pulmonary effort is normal. Tachypnea present. No respiratory distress.     Breath sounds: No wheezing, rhonchi or rales.  Abdominal:     General: There is no distension.  Musculoskeletal:     Cervical back: Normal range of motion.  Skin:    General: Skin is warm and dry.  Neurological:     Mental Status: He is alert and oriented to person, place, and time.  Psychiatric:        Behavior: Behavior normal.     ED Results / Procedures / Treatments   Labs (all labs ordered are listed, but only abnormal results are displayed) Labs Reviewed  BASIC METABOLIC PANEL - Abnormal; Notable for the following components:      Result Value   Glucose, Bld 122 (*)    Creatinine, Ser 1.45 (*)    GFR calc non Af Amer 48 (*)    GFR calc Af Amer 56 (*)    All other components within normal limits  CBC WITH DIFFERENTIAL/PLATELET    EKG EKG Interpretation  Date/Time:  Saturday May 11 2019 09:54:19 EST Ventricular Rate:  103 PR Interval:    QRS Duration: 83 QT Interval:  316 QTC Calculation: 414 R Axis:   58 Text Interpretation: Atrial fibrillation Ventricular premature complex Anterior infarct, old Confirmed by Sherwood Gambler [phone]) on 05/11/2019 9:58:41 AM   Radiology DG Chest Port 1 View  Result  Date: 05/11/2019 CLINICAL DATA:  Shortness of breath.  COVID-19 positive EXAM: PORTABLE CHEST 1 VIEW COMPARISON:  October 09, 2017 FINDINGS: There is airspace opacity in the right base consistent with pneumonia. There is slight left base atelectasis. The lungs elsewhere are clear. Heart size and pulmonary vascularity are normal. No adenopathy. There is aortic atherosclerosis. Bone lesions. IMPRESSION: Airspace opacity consistent with pneumonia right base region. Mild left base atelectasis. Cardiac silhouette normal. No adenopathy. Aortic Atherosclerosis (ICD10-I70.0). Electronically Signed   By: Lowella Grip III M.D.   On: 05/11/2019 10:05    Procedures Procedures (including critical care time)  Medications Ordered in ED Medications  acetaminophen (TYLENOL) tablet 650 mg (650 mg Oral Given 05/11/19 1052)    ED Course  I have reviewed the triage vital signs and the nursing notes.  Pertinent labs & imaging results that were available during my care of the patient were reviewed by me and considered in my medical decision making (see chart for details).  69 year old male with COVID who presents with worsening SOB. He is febrile here (100.8) and mildly tachypenic but otherwise vitals are reassuring. CBC is normal. BMP is remarkable for mildly elevated SCr (1.45) which is slightly higher than his baseline. CXR shows pneumonia in the right lower lung. EKG shows rate controlled A. Fib. Pt was ambulated by nursing and he did have transient hypoxia to 86% which improved to >95% at rest. Discussed with Dr. Regenia Skeeter. Will refer to infusion clinic. Pt was given info for clinic and we discussed strict return precautions.  MDM Rules/Calculators/A&P                       Final Clinical Impression(s) / ED Diagnoses Final diagnoses:  COVID-19  Pneumonia due to COVID-19 virus    Rx / DC  Orders ED Discharge Orders    None       Iris Pert 05/11/19 [place], MD 05/11/19  [phone]

## 2019-05-11 NOTE — Progress Notes (Signed)
I connected by phone with Marvin Chandler on 05/11/2019 at 2:27 PM to discuss the potential use of an new treatment for mild to moderate COVID-19 viral infection in non-hospitalized patients.  This patient is a 69 y.o. male that meets the FDA criteria for Emergency Use Authorization of bamlanivimab or casirivimab\imdevimab.  Has a (+) direct SARS-CoV-2 viral test result  Has mild or moderate COVID-19   Is ? 71 years of age and weighs ? 40 kg  Is NOT hospitalized due to COVID-19  Is NOT requiring oxygen therapy or requiring an increase in baseline oxygen flow rate due to COVID-19  Is within 10 days of symptom onset  Has at least one of the high risk factor(s) for progression to severe COVID-19 and/or hospitalization as defined in EUA.  Specific high risk criteria : >/= 71 yo   I have spoken and communicated the following to the patient or parent/caregiver:  1. FDA has authorized the emergency use of bamlanivimab and casirivimab\imdevimab for the treatment of mild to moderate COVID-19 in adults and pediatric patients with positive results of direct SARS-CoV-2 viral testing who are 39 years of age and older weighing at least 40 kg, and who are at high risk for progressing to severe COVID-19 and/or hospitalization.  2. The significant known and potential risks and benefits of bamlanivimab and casirivimab\imdevimab, and the extent to which such potential risks and benefits are unknown.  3. Information on available alternative treatments and the risks and benefits of those alternatives, including clinical trials.  4. Patients treated with bamlanivimab and casirivimab\imdevimab should continue to self-isolate and use infection control measures (e.g., wear mask, isolate, social distance, avoid sharing personal items, clean and disinfect "high touch" surfaces, and frequent handwashing) according to CDC guidelines.   5. The patient or parent/caregiver has the option to accept or refuse  bamlanivimab or casirivimab\imdevimab .  After reviewing this information with the patient, The patient agreed to proceed with receiving the bamlanimivab infusion and will be provided a copy of the Fact sheet prior to receiving the infusion.   Marvin Chandler 05/11/2019 2:27 PM

## 2019-05-13 ENCOUNTER — Encounter (HOSPITAL_COMMUNITY): Payer: Self-pay | Admitting: Emergency Medicine

## 2019-05-13 ENCOUNTER — Emergency Department (HOSPITAL_COMMUNITY)
Admission: EM | Admit: 2019-05-13 | Discharge: 2019-05-13 | Disposition: A | Discharge status: Home / Self Care | Payer: No Typology Code available for payment source | Attending: Emergency Medicine | Admitting: Emergency Medicine

## 2019-05-13 DIAGNOSIS — I2510 Atherosclerotic heart disease of native coronary artery without angina pectoris: Secondary | ICD-10-CM | POA: Insufficient documentation

## 2019-05-13 DIAGNOSIS — Z79899 Other long term (current) drug therapy: Secondary | ICD-10-CM | POA: Insufficient documentation

## 2019-05-13 DIAGNOSIS — U071 COVID-19: Secondary | ICD-10-CM | POA: Diagnosis not present

## 2019-05-13 DIAGNOSIS — J4521 Mild intermittent asthma with (acute) exacerbation: Secondary | ICD-10-CM | POA: Diagnosis not present

## 2019-05-13 DIAGNOSIS — R05 Cough: Secondary | ICD-10-CM | POA: Diagnosis present

## 2019-05-13 DIAGNOSIS — I5022 Chronic systolic (congestive) heart failure: Secondary | ICD-10-CM | POA: Insufficient documentation

## 2019-05-13 DIAGNOSIS — Z87891 Personal history of nicotine dependence: Secondary | ICD-10-CM | POA: Insufficient documentation

## 2019-05-13 DIAGNOSIS — J4522 Mild intermittent asthma with status asthmaticus: Secondary | ICD-10-CM

## 2019-05-13 MED ORDER — ALBUTEROL SULFATE HFA 108 (90 BASE) MCG/ACT IN AERS
6.0000 | INHALATION_SPRAY | Freq: Once | RESPIRATORY_TRACT | Status: AC
  Administered 2019-05-13: 6 via RESPIRATORY_TRACT
  Filled 2019-05-13: qty 6.7

## 2019-05-13 MED ORDER — GUAIFENESIN-CODEINE 100-10 MG/5ML PO SOLN: 5.0000 mL | Freq: Four times a day (QID) | ORAL | 0 refills | Status: DC | PRN

## 2019-05-13 MED ORDER — DEXAMETHASONE SODIUM PHOSPHATE 10 MG/ML IJ SOLN
10.0000 mg | Freq: Once | INTRAMUSCULAR | Status: AC
  Administered 2019-05-13: 21:00:00 10 mg via INTRAVENOUS
  Filled 2019-05-13: qty 1

## 2019-05-13 MED ORDER — MAGNESIUM SULFATE 2 GM/50ML IV SOLN
2.0000 g | INTRAVENOUS | Status: AC
  Administered 2019-05-13: 21:00:00 2 g via INTRAVENOUS
  Filled 2019-05-13: qty 50

## 2019-05-13 MED ORDER — AEROCHAMBER PLUS FLO-VU LARGE MISC
1.0000 | Freq: Once | Status: AC
  Administered 2019-05-13: 21:00:00 1

## 2019-05-13 MED ORDER — SODIUM CHLORIDE 0.9 % IV BOLUS
500.0000 mL | Freq: Once | INTRAVENOUS | Status: AC
  Administered 2019-05-13: 21:00:00 500 mL via INTRAVENOUS

## 2019-05-13 NOTE — ED Provider Notes (Signed)
[facility] EMERGENCY DEPARTMENT Provider Note   CSN: [id number] Arrival date & time: 05/13/19  1643     History No chief complaint on file.   Marvin Chandler is a 69 y.o. male who presents emergency department chief complaint of coughing.  He is known Covid positive.  He has a past medical history of asthma, diabetes, cardiomyopathy, CHF.  Patient states that he has had significant difficulty sleeping because he is coughing and wheezing.  He has been using his inhaler without relief of his symptoms.  His blood sugars have been "running in the low 100s."  He denies any orthopnea or PND.  Patient states "I really just want to be able to get some sleep."  HPI     Past Medical History:  Diagnosis Date  . Arthritis of hip    bilateral  . Asthma   . CAD (coronary artery disease)    LHC 5/16:  Dx ostial 30%, LAD luminal irregs, EF 40%  . Chronic systolic CHF (congestive heart failure) ([place])    a. Echo 3/16:  inf and inf-lat HK, mild LVH, EF 45%, mild to mod LAE, normal RVF  . Cough   . Hiatal hernia   . NICM (nonischemic cardiomyopathy) ([place])   . Osteoarthritis   . Personal history of colonic adenomas 02/20/2013  . Pneumonia     Patient Active Problem List   Diagnosis Date Noted  . Non-cardiac chest pain 11/08/2017  . Asthma with acute exacerbation 10/06/2014  . History of asthma 08/15/2014  . Hemoptysis 08/15/2014  . Chronic cough 08/15/2014  . Cardiomyopathy ([place]) 08/13/2014  . Asthma exacerbation 07/16/2014  . Pleuritic chest pain 07/16/2014  . Asthma, chronic 02/19/2014  . Personal history of colonic adenomas 02/20/2013  . Dyspnea 12/16/2011  . Chest pain 12/16/2011  . HIATAL HERNIA 07/11/2006  . OSTEOARTHRITIS 07/11/2006  . ARTHRITIS, HIPS, BILATERAL 07/11/2006    Past Surgical History:  Procedure Laterality Date  . COLONOSCOPY  02/13/13  . LEFT AND RIGHT HEART CATHETERIZATION WITH CORONARY ANGIOGRAM N/A 08/18/2014   Procedure: LEFT AND RIGHT  HEART CATHETERIZATION WITH CORONARY ANGIOGRAM;  Surgeon: [name], MD;  Location: [facility] CATH LAB;  Service: Cardiovascular;  Laterality: N/A;  . LEFT HEART CATH AND CORONARY ANGIOGRAPHY N/A 11/09/2017   Procedure: LEFT HEART CATH AND CORONARY ANGIOGRAPHY;  Surgeon: [name], MD;  Location: [facility] CV LAB;  Service: Cardiovascular;  Laterality: N/A;  . TOTAL HIP ARTHROPLASTY  2002   bilateral       Family History  Problem Relation Age of Onset  . Dementia Mother   . Cancer Other   . Stroke Cousin   . Cancer Maternal Aunt   . Diabetes Cousin   . Colon cancer Neg Hx   . Esophageal cancer Neg Hx   . Heart attack Neg Hx   . Hypertension Neg Hx     Social History   Tobacco Use  . Smoking status: Former Smoker    Packs/day: 1.00    Years: 7.00    Pack years: 7.00    Types: Cigarettes    Quit date: 11/05/1997    Years since quitting: 21.5  . Smokeless tobacco: Never Used  Substance Use Topics  . Alcohol use: No    Alcohol/week: 0.0 standard drinks  . Drug use: No    Home Medications Prior to Admission medications   Medication Sig Start Date End Date Taking? Authorizing Provider  albuterol (PROVENTIL) (2.5 MG/3ML) 0.083% nebulizer solution Take  3 mLs (2.5 mg total) by nebulization every 6 (six) hours as needed for wheezing or shortness of breath. 07/22/14   Theodis Blaze, MD  albuterol (VENTOLIN HFA) 108 (90 Base) MCG/ACT inhaler Inhale 2 puffs into the lungs every 4 (four) hours as needed. 06/13/16   Brand Males, MD  budesonide-formoterol (SYMBICORT) 160-4.5 MCG/ACT inhaler Inhale 2 puffs into the lungs daily.    [provider]  cetirizine (ZYRTEC) 10 MG tablet Take 10 mg by mouth daily.    [provider]  EPINEPHrine 0.3 mg/0.3 mL IJ SOAJ injection Inject 0.3 mLs (0.3 mg total) into the muscle once. 08/05/15   Deneise Lever, MD  fluticasone (FLONASE) 50 MCG/ACT nasal spray Place 2 sprays into both nostrils daily. 07/11/16   Brand Males, MD  furosemide (LASIX) 40 MG tablet Take 1 tablet (40 mg total) by mouth 2 (two) times daily. 06/22/16   Burnell Blanks, MD  guaiFENesin-codeine 100-10 MG/5ML syrup Take 5-10 mLs by mouth every 6 (six) hours as needed for cough. 05/13/19   Javius Sylla, Vernie Shanks, PA-C  ibuprofen (ADVIL,MOTRIN) 200 MG tablet Take 400 mg by mouth every 6 (six) hours as needed for moderate pain.    [provider]  losartan (COZAAR) 50 MG tablet Take 1 tablet (50 mg total) by mouth daily. 06/22/16   Burnell Blanks, MD  montelukast (SINGULAIR) 10 MG tablet TAKE 1 TABLET BY MOUTH AT BEDTIME 08/02/16   Brand Males, MD  Nebulizers MISC by Does not apply route 4 (four) times daily. Use with nebulizer four times a day as needed for wheezing or sob    [provider]  nitroGLYCERIN (NITROSTAT) 0.4 MG SL tablet Place 1 tablet (0.4 mg total) under the tongue every 5 (five) minutes as needed. 11/10/17   Lyda Jester M, PA-C  omeprazole (PRILOSEC) 40 MG capsule Take 1 capsule by mouth every morning on an empty stomach. Take 30 minutes before eating. 12/31/13   Barton Fanny, MD  pravastatin (PRAVACHOL) 80 MG tablet Take 1 tablet (80 mg total) by mouth every evening. 06/24/16   Burnell Blanks, MD  rivaroxaban (XARELTO) 20 MG TABS tablet Take 1 tablet (20 mg total) by mouth daily with supper. 06/22/16   Burnell Blanks, MD  sildenafil (VIAGRA) 50 MG tablet Take 1 tablet (50 mg total) by mouth daily as needed for erectile dysfunction. 12/18/14   Burnell Blanks, MD  SPIRIVA HANDIHALER 18 MCG inhalation capsule INHALE THE CONTENTS OF 1 CAPSULE VIA HANDIHALER BY MOUTH DAILY 06/09/16   Brand Males, MD    Allergies    Patient has no known allergies.  Review of Systems   Review of Systems Ten systems reviewed and are negative for acute change, except as noted in the HPI.   Physical Exam Updated Vital Signs BP (!) 112/58   Pulse 90   Temp 98.2 F (36.8 C)  (Oral)   Resp 16   SpO2 99%   Physical Exam Vitals and nursing note reviewed.  Constitutional:      General: He is not in acute distress.    Appearance: He is well-developed. He is not diaphoretic.  HENT:     Head: Normocephalic and atraumatic.  Eyes:     General: No scleral icterus.    Conjunctiva/sclera: Conjunctivae normal.  Cardiovascular:     Rate and Rhythm: Normal rate and regular rhythm.     Heart sounds: Normal heart sounds.  Pulmonary:     Effort: Pulmonary effort is  normal. No respiratory distress.     Breath sounds: Wheezing present.  Abdominal:     Palpations: Abdomen is soft.     Tenderness: There is no abdominal tenderness.  Musculoskeletal:     Cervical back: Normal range of motion and neck supple.  Skin:    General: Skin is warm and dry.  Neurological:     Mental Status: He is alert.  Psychiatric:        Behavior: Behavior normal.     ED Results / Procedures / Treatments   Labs (all labs ordered are listed, but only abnormal results are displayed) Labs Reviewed - No data to display  EKG None  Radiology No results found.  Procedures Procedures (including critical care time)  Medications Ordered in ED Medications  albuterol (VENTOLIN HFA) 108 (90 Base) MCG/ACT inhaler 6 puff (6 puffs Inhalation Given 05/13/19 2125)  dexamethasone (DECADRON) injection 10 mg (10 mg Intravenous Given 05/13/19 2125)  magnesium sulfate IVPB 2 g 50 mL (0 g Intravenous Stopped 05/13/19 2309)  AeroChamber Plus Flo-Vu Large MISC 1 each (1 each Other Given 05/13/19 2125)  sodium chloride 0.9 % bolus 500 mL (0 mLs Intravenous Stopped 05/13/19 2309)    ED Course  I have reviewed the triage vital signs and the nursing notes.  Pertinent labs & imaging results that were available during my care of the patient were reviewed by me and considered in my medical decision making (see chart for details).    MDM Rules/Calculators/A&P                     [id number]- known covid  + VS:  Vitals:   05/13/19 2045 05/13/19 2230 05/13/19 2245 05/13/19 2300  BP: 125/84 125/72 117/71 (!) 112/58  Pulse: 96 90  90  Resp: 16     Temp:      TempSrc:      SpO2: 100% 94%  99%   [id number] is gathered by patient  and EMR. DDX:The emergent differential diagnosis for shortness of breath includes, but is not limited to, Pulmonary edema, bronchoconstriction, Pneumonia, Pulmonary embolism, PneumN/A Imaging: n/a EKG: MDM: hX of asthma- afebrile, HDS with normal oxygen saturation. Patient treated for RAD in the setting of COVID 19 infection with significant improvement in his sxs. Patient counseled on elevated blood sugar after decadron administration. Patient disposition:Discharge Patient condition: Good. The patient appears reasonably screened and/or stabilized for discharge and I doubt any other medical condition or other [facility] requiring further screening, evaluation, or treatment in the ED at this time prior to discharge. I have discussed lab and/or imaging findings with the patient and answered all questions/concerns to the best of my ability. I have discussed return precautions and OP follow up.      Final Clinical Impression(s) / ED Diagnoses Final diagnoses:  [id number] virus infection  Mild intermittent reactive airway disease with status asthmaticus    Rx / DC Orders ED Discharge Orders         Ordered    guaiFENesin-codeine 100-10 MG/5ML syrup  Every 6 hours PRN,   Status:  Discontinued     05/13/19 2300    guaiFENesin-codeine 100-10 MG/5ML syrup  Every 6 hours PRN     05/13/19 2300           Margarita Mail, PA-C 05/14/19 1031    Dorie Rank, MD 05/14/19 1213

## 2019-05-13 NOTE — ED Triage Notes (Signed)
Pt back today with continued sob from a dx of covid , pt states that he mainly needs something to help him sleep at night ,nad in triage

## 2019-05-13 NOTE — Discharge Instructions (Signed)
Contact a health care provider if: °You have muscle aches. °You have chest pain. °The mucus that you cough up (sputum) changes from clear or white to yellow, green, gray, or bloody. °You have a fever. °Your sputum gets thicker. °Get help right away if: °Your wheezing and coughing get worse, even after you take your prescribed medicines. °It gets even harder to breathe. °You develop severe chest pain. °

## 2019-05-14 ENCOUNTER — Ambulatory Visit (HOSPITAL_COMMUNITY)
Admission: RE | Admit: 2019-05-14 | Discharge: 2019-05-14 | Disposition: A | Discharge status: Home / Self Care | Payer: Medicare Other | Source: Ambulatory Visit | Attending: Pulmonary Disease | Admitting: Pulmonary Disease

## 2019-05-14 DIAGNOSIS — Z23 Encounter for immunization: Secondary | ICD-10-CM | POA: Insufficient documentation

## 2019-05-14 DIAGNOSIS — U071 COVID-19: Secondary | ICD-10-CM | POA: Diagnosis present

## 2019-05-14 MED ORDER — METHYLPREDNISOLONE SODIUM SUCC 125 MG IJ SOLR: 125.0000 mg | Freq: Once | INTRAMUSCULAR | Status: DC | PRN

## 2019-05-14 MED ORDER — SODIUM CHLORIDE 0.9 % IV SOLN
INTRAVENOUS | Status: DC | PRN
  Administered 2019-05-14: 250 mL via INTRAVENOUS

## 2019-05-14 MED ORDER — FAMOTIDINE IN NACL 20-0.9 MG/50ML-% IV SOLN: 20.0000 mg | Freq: Once | INTRAVENOUS | Status: DC | PRN

## 2019-05-14 MED ORDER — EPINEPHRINE 0.3 MG/0.3ML IJ SOAJ: 0.3000 mg | Freq: Once | INTRAMUSCULAR | Status: DC | PRN

## 2019-05-14 MED ORDER — ALBUTEROL SULFATE HFA 108 (90 BASE) MCG/ACT IN AERS: 2.0000 | INHALATION_SPRAY | Freq: Once | RESPIRATORY_TRACT | Status: DC | PRN

## 2019-05-14 MED ORDER — SODIUM CHLORIDE 0.9 % IV SOLN
700.0000 mg | Freq: Once | INTRAVENOUS | Status: AC
  Administered 2019-05-14: 700 mg via INTRAVENOUS
  Filled 2019-05-14: qty 20

## 2019-05-14 MED ORDER — DIPHENHYDRAMINE HCL 50 MG/ML IJ SOLN: 50.0000 mg | Freq: Once | INTRAMUSCULAR | Status: DC | PRN

## 2019-05-14 NOTE — Discharge Instructions (Signed)

## 2019-05-14 NOTE — Progress Notes (Signed)
  Diagnosis: COVID-19  Physician: Dr. Wright  Procedure: Covid Infusion Clinic Med: bamlanivimab infusion - Provided patient with bamlanimivab fact sheet for patients, parents and caregivers prior to infusion.  Complications: No immediate complications noted.  Discharge: Discharged home   Marvin Chandler 05/14/2019  

## 2019-06-13 NOTE — Progress Notes (Signed)
Chief Complaint  Patient presents with  . Follow-up    CAD   History of Present Illness:  69 yo male with history of CAD, non-ischemic cardiomyopathy and atrial fibrillation who is here today for follow up. I saw him August 2013 as a new patient for evaluation of chest pain and an abnormal EKG. Stress test in 2013 with no ischemia. Echo 2013 with with normal LVEF 50-55%, mild LVH.  Echo 2015 in setting of pneumonia demonstrated worsening LVEF with EF 45%.He had sharp chest pains as well. Cardiac cath April 2015 demonstrated very mild non-obstructive CAD and mild elevated filling pressures. He was felt to have a non-ischemic cardiomyopathy. Lasix was adjusted and he was placed on Losartan. I saw him in the office 03/07/16 and he was in atrial fibrillation. He was started on Xarelto. His heart rate was not elevated so no rate control agent was started. He was admitted to [facility] in July 2019 with chest pain. Cardiac cath July 2019 with mild proximal LAD stenosis. Echo July 2019 with LVEF=35-40% with diffuse hypokinesis. No significant valve disease.   He is here today for follow up. The patient denies any chest pain, dyspnea, palpitations, lower extremity edema, orthopnea, PND, dizziness, near syncope or syncope.   Primary Care Physician: [facility]  Past Medical History:  Diagnosis Date  . Arthritis of hip    bilateral  . Asthma   . CAD (coronary artery disease)    LHC 5/16:  Dx ostial 30%, LAD luminal irregs, EF 40%  . Chronic systolic CHF (congestive heart failure) ([place])    a. Echo 3/16:  inf and inf-lat HK, mild LVH, EF 45%, mild to mod LAE, normal RVF  . Cough   . Hiatal hernia   . NICM (nonischemic cardiomyopathy) ([place])   . Osteoarthritis   . Personal history of colonic adenomas 02/20/2013  . Pneumonia     Past Surgical History:  Procedure Laterality Date  . COLONOSCOPY  02/13/13  . LEFT AND RIGHT HEART CATHETERIZATION WITH CORONARY ANGIOGRAM N/A 08/18/2014    Procedure: LEFT AND RIGHT HEART CATHETERIZATION WITH CORONARY ANGIOGRAM;  Surgeon: [name], MD;  Location: [facility] CATH LAB;  Service: Cardiovascular;  Laterality: N/A;  . LEFT HEART CATH AND CORONARY ANGIOGRAPHY N/A 11/09/2017   Procedure: LEFT HEART CATH AND CORONARY ANGIOGRAPHY;  Surgeon: [name], MD;  Location: [place] CV LAB;  Service: Cardiovascular;  Laterality: N/A;  . TOTAL HIP ARTHROPLASTY  2002   bilateral    Current Outpatient Medications  Medication Sig Dispense Refill  . albuterol (PROVENTIL) (2.5 MG/3ML) 0.083% nebulizer solution Take 3 mLs (2.5 mg total) by nebulization every 6 (six) hours as needed for wheezing or shortness of breath. 300 mL 5  . albuterol (VENTOLIN HFA) 108 (90 Base) MCG/ACT inhaler Inhale 2 puffs into the lungs every 4 (four) hours as needed. 18 g 6  . budesonide-formoterol (SYMBICORT) 160-4.5 MCG/ACT inhaler Inhale 2 puffs into the lungs daily.    . cetirizine (ZYRTEC) 10 MG tablet Take 10 mg by mouth daily.    Marland Kitchen EPINEPHrine 0.3 mg/0.3 mL IJ SOAJ injection Inject 0.3 mLs (0.3 mg total) into the muscle once. 1 Device 11  . fluticasone (FLONASE) 50 MCG/ACT nasal spray Place 2 sprays into both nostrils daily. 16 g 5  . furosemide (LASIX) 40 MG tablet Take 1 tablet (40 mg total) by mouth 2 (two) times daily. 60 tablet 11  . guaiFENesin-codeine 100-10 MG/5ML syrup Take 5-10 mLs by mouth  every 6 (six) hours as needed for cough. 120 mL 0  . ibuprofen (ADVIL,MOTRIN) 200 MG tablet Take 400 mg by mouth every 6 (six) hours as needed for moderate pain.    Marland Kitchen losartan (COZAAR) 50 MG tablet Take 1 tablet (50 mg total) by mouth daily. 30 tablet 11  . montelukast (SINGULAIR) 10 MG tablet TAKE 1 TABLET BY MOUTH AT BEDTIME 30 tablet 30  . Nebulizers MISC by Does not apply route 4 (four) times daily. Use with nebulizer four times a day as needed for wheezing or sob    . nitroGLYCERIN (NITROSTAT) 0.4 MG SL tablet Place 1 tablet (0.4 mg total) under the tongue  every 5 (five) minutes as needed. 25 tablet 3  . omeprazole (PRILOSEC) 40 MG capsule Take 1 capsule by mouth every morning on an empty stomach. Take 30 minutes before eating. 30 capsule 3  . pravastatin (PRAVACHOL) 80 MG tablet Take 1 tablet (80 mg total) by mouth every evening. 30 tablet 11  . rivaroxaban (XARELTO) 20 MG TABS tablet Take 1 tablet (20 mg total) by mouth daily with supper. 30 tablet 6  . sildenafil (VIAGRA) 50 MG tablet Take 1 tablet (50 mg total) by mouth daily as needed for erectile dysfunction. 6 tablet 5  . SPIRIVA HANDIHALER 18 MCG inhalation capsule INHALE THE CONTENTS OF 1 CAPSULE VIA HANDIHALER BY MOUTH DAILY 30 capsule 3   Current Facility-Administered Medications  Medication Dose Route Frequency Provider Last Rate Last Admin  . omalizumab Arvid Right) injection 375 mg  375 mg Subcutaneous Once Deneise Lever, MD        No Known Allergies  Social History   Socioeconomic History  . Marital status: Married    Spouse name: Not on file  . Number of children: 2  . Years of education: Not on file  . Highest education level: Not on file  Occupational History  . Occupation: Scientist, water quality: [employer]  Tobacco Use  . Smoking status: Former Smoker    Packs/day: 1.00    Years: 7.00    Pack years: 7.00    Types: Cigarettes    Quit date: 11/05/1997    Years since quitting: 21.6  . Smokeless tobacco: Never Used  Substance and Sexual Activity  . Alcohol use: No    Alcohol/week: 0.0 standard drinks  . Drug use: No  . Sexual activity: Not on file  Other Topics Concern  . Not on file  Social History Narrative  . Not on file   Social Determinants of Health   Financial Resource Strain:   . Difficulty of Paying Living Expenses: Not on file  Food Insecurity:   . Worried About Charity fundraiser in the Last Year: Not on file  . Ran Out of Food in the Last Year: Not on file  Transportation Needs:   . Lack of Transportation  (Medical): Not on file  . Lack of Transportation (Non-Medical): Not on file  Physical Activity:   . Days of Exercise per Week: Not on file  . Minutes of Exercise per Session: Not on file  Stress:   . Feeling of Stress : Not on file  Social Connections:   . Frequency of Communication with Friends and Family: Not on file  . Frequency of Social Gatherings with Friends and Family: Not on file  . Attends Religious Services: Not on file  . Active Member of Clubs or Organizations: Not on file  . Attends  Club or Organization Meetings: Not on file  . Marital Status: Not on file  Intimate Partner Violence:   . Fear of Current or Ex-Partner: Not on file  . Emotionally Abused: Not on file  . Physically Abused: Not on file  . Sexually Abused: Not on file    Family History  Problem Relation Age of Onset  . Dementia Mother   . Cancer Other   . Stroke Cousin   . Cancer Maternal Aunt   . Diabetes Cousin   . Colon cancer Neg Hx   . Esophageal cancer Neg Hx   . Heart attack Neg Hx   . Hypertension Neg Hx     Review of Systems:  As stated in the HPI and otherwise negative.   BP 126/76   Pulse 90   Ht 5\' 11"  (1.803 m)   Wt 222 lb 1.9 oz (100.8 kg)   SpO2 96%   BMI 30.98 kg/m   Physical Examination:  General: Well developed, well nourished, NAD  HEENT: OP clear, mucus membranes moist  SKIN: warm, dry. No rashes. Neuro: No focal deficits  Musculoskeletal: Muscle strength 5/5 all ext  Psychiatric: Mood and affect normal  Neck: No JVD, no carotid bruits, no thyromegaly, no lymphadenopathy.  Lungs:Clear bilaterally, no wheezes, rhonci, crackles Cardiovascular: Regular rate and rhythm. No murmurs, gallops or rubs. Abdomen:Soft. Bowel sounds present. Non-tender.  Extremities: No lower extremity edema. Pulses are 2 + in the bilateral DP/PT.  Echo July 2019:  - Left ventricle: LVEF is approximately 35 to 40% with diffuse  hypokinesis, worse in the inferior and posterior walls.  Compared  to echo from 2016, LVEF is a little more depressed. The cavity  size was normal. Wall thickness was increased in a pattern of  mild LVH.   EKG:  EKG is not ordered today. The ekg ordered today demonstrates    Recent Labs: 05/11/2019: BUN 11; Creatinine, Ser 1.45; Hemoglobin 15.1; Platelets 154; Potassium 4.8; Sodium 137   Lipid Panel    Component Value Date/Time   CHOL 122 11/09/2017 0321   CHOL 152 09/12/2016 0827   TRIG 96 11/09/2017 0321   HDL 32 (L) 11/09/2017 0321   HDL 40 09/12/2016 0827   CHOLHDL 3.8 11/09/2017 0321   VLDL 19 11/09/2017 0321   LDLCALC 71 11/09/2017 0321   LDLCALC 86 09/12/2016 0827     Wt Readings from Last 3 Encounters:  06/14/19 222 lb 1.9 oz (100.8 kg)  11/10/17 227 lb 14.4 oz (103.4 kg)  12/21/16 226 lb 12.8 oz (102.9 kg)     Other studies Reviewed: Additional studies/ records that were reviewed today include: . Review of the above records demonstrates:   Assessment and Plan:   1. CAD without angina: He has mild CAD by cath in July 2019. He has not chest. Will continue statin and Losartan. No ASA since he is on Xarelto.    2. Non-ischemic Cardiomyopathy: LVEF=45% by echo 2016. No beta blocker due to AV block/conduction disease. Continue Cozaar.   3. Chronic systolic CHF: Weight is stable. He has trace edema in both legs. Continue Lasix.   4. Atrial fibrillation, paroxysmal: He is in atrial fibrillation today. Rate is in the 90s. He has not been on AV nodal blocking agents due to AV block seen during hospitalization in July 2019. Continue Xarelto.  5. Hyperlipidemia: Continue statin. Will check lipids and LFTs today.   Current medicines are reviewed at length with the patient today.  The patient does not have  concerns regarding medicines.  The following changes have been made:  no change  Labs/ tests ordered today include:   Orders Placed This Encounter  Procedures  . Hepatic function panel  . Lipid panel     Disposition:   FU with me in 12 months  Signed, Lauree Chandler, MD 06/14/2019 8:39 AM    [facility] [address], [place], [place]  [address] Phone: [phone]; Fax: [phone]

## 2019-06-14 ENCOUNTER — Other Ambulatory Visit: Payer: Self-pay

## 2019-06-14 ENCOUNTER — Encounter: Payer: Self-pay | Admitting: Cardiovascular Disease

## 2019-06-14 ENCOUNTER — Ambulatory Visit (INDEPENDENT_AMBULATORY_CARE_PROVIDER_SITE_OTHER): Payer: Medicare Other | Admitting: Cardiovascular Disease

## 2019-06-14 VITALS — BP 126/76 | HR 90 | Ht 71.0 in | Wt 222.1 lb

## 2019-06-14 DIAGNOSIS — I4819 Other persistent atrial fibrillation: Secondary | ICD-10-CM | POA: Diagnosis not present

## 2019-06-14 DIAGNOSIS — I2510 Atherosclerotic heart disease of native coronary artery without angina pectoris: Secondary | ICD-10-CM | POA: Diagnosis not present

## 2019-06-14 DIAGNOSIS — E7800 Pure hypercholesterolemia, unspecified: Secondary | ICD-10-CM

## 2019-06-14 DIAGNOSIS — I428 Other cardiomyopathies: Secondary | ICD-10-CM | POA: Diagnosis not present

## 2019-06-14 DIAGNOSIS — I5022 Chronic systolic (congestive) heart failure: Secondary | ICD-10-CM

## 2019-06-14 LAB — HEPATIC FUNCTION PANEL
ALT: 9 IU/L (ref 0–44)
AST: 15 IU/L (ref 0–40)
Albumin: 3.7 g/dL — ABNORMAL LOW (ref 3.8–4.8)
Alkaline Phosphatase: 101 IU/L (ref 39–117)
Bilirubin Total: 0.5 mg/dL (ref 0.0–1.2)
Bilirubin, Direct: 0.18 mg/dL (ref 0.00–0.40)
Total Protein: 6 g/dL (ref 6.0–8.5)

## 2019-06-14 LAB — LIPID PANEL
Chol/HDL Ratio: 2.5 ratio (ref 0.0–5.0)
Cholesterol, Total: 75 mg/dL — ABNORMAL LOW (ref 100–199)
HDL: 30 mg/dL — ABNORMAL LOW (ref >39)
LDL Chol Calc (NIH): 32 mg/dL (ref 0–99)
Triglycerides: 52 mg/dL (ref 0–149)
VLDL Cholesterol Cal: 13 mg/dL (ref 5–40)

## 2019-06-14 NOTE — Patient Instructions (Signed)
Medication Instructions:  No changes *If you need a refill on your cardiac medications before your next appointment, please call your pharmacy*  Lab Work: Today: lipids/liver function If you have labs (blood work) drawn today and your tests are completely normal, you will receive your results only by: Marland Kitchen MyChart Message (if you have MyChart) OR . A paper copy in the mail If you have any lab test that is abnormal or we need to change your treatment, we will call you to review the results.  Testing/Procedures: none  Follow-Up: At [facility], you and your health needs are our priority.  As part of our continuing mission to provide you with exceptional heart care, we have created designated Provider Care Teams.  These Care Teams include your primary Cardiologist (physician) and Advanced Practice Providers (APPs -  Physician Assistants and Nurse Practitioners) who all work together to provide you with the care you need, when you need it.  Your next appointment:   12 month(s)  The format for your next appointment:   In Person  Provider:   You may see Lauree Chandler, MD or one of the following Advanced Practice Providers on your designated Care Team:    Melina Copa, PA-C  Ermalinda Barrios, PA-C   Other Instructions

## 2019-07-29 ENCOUNTER — Encounter (HOSPITAL_COMMUNITY): Payer: Self-pay | Admitting: *Deleted

## 2019-07-29 ENCOUNTER — Other Ambulatory Visit: Payer: Self-pay

## 2019-07-29 ENCOUNTER — Emergency Department (HOSPITAL_COMMUNITY): Payer: No Typology Code available for payment source

## 2019-07-29 ENCOUNTER — Inpatient Hospital Stay (HOSPITAL_COMMUNITY)
Admission: EM | Admit: 2019-07-29 | Discharge: 2019-07-31 | DRG: 292 | Disposition: A | Discharge status: Home / Self Care | Payer: No Typology Code available for payment source | Attending: Cardiovascular Disease | Admitting: Cardiovascular Disease

## 2019-07-29 DIAGNOSIS — Z7901 Long term (current) use of anticoagulants: Secondary | ICD-10-CM

## 2019-07-29 DIAGNOSIS — I5043 Acute on chronic combined systolic (congestive) and diastolic (congestive) heart failure: Secondary | ICD-10-CM | POA: Diagnosis present

## 2019-07-29 DIAGNOSIS — I472 Ventricular tachycardia: Secondary | ICD-10-CM | POA: Diagnosis not present

## 2019-07-29 DIAGNOSIS — R0602 Shortness of breath: Secondary | ICD-10-CM

## 2019-07-29 DIAGNOSIS — I5021 Acute systolic (congestive) heart failure: Secondary | ICD-10-CM | POA: Insufficient documentation

## 2019-07-29 DIAGNOSIS — Z8616 Personal history of COVID-19: Secondary | ICD-10-CM

## 2019-07-29 DIAGNOSIS — I482 Chronic atrial fibrillation, unspecified: Secondary | ICD-10-CM | POA: Diagnosis not present

## 2019-07-29 DIAGNOSIS — J4540 Moderate persistent asthma, uncomplicated: Secondary | ICD-10-CM

## 2019-07-29 DIAGNOSIS — I4892 Unspecified atrial flutter: Secondary | ICD-10-CM | POA: Diagnosis not present

## 2019-07-29 DIAGNOSIS — I428 Other cardiomyopathies: Secondary | ICD-10-CM | POA: Diagnosis present

## 2019-07-29 DIAGNOSIS — Z79899 Other long term (current) drug therapy: Secondary | ICD-10-CM | POA: Diagnosis not present

## 2019-07-29 DIAGNOSIS — I5023 Acute on chronic systolic (congestive) heart failure: Secondary | ICD-10-CM | POA: Diagnosis not present

## 2019-07-29 DIAGNOSIS — I110 Hypertensive heart disease with heart failure: Secondary | ICD-10-CM | POA: Diagnosis present

## 2019-07-29 DIAGNOSIS — R079 Chest pain, unspecified: Secondary | ICD-10-CM | POA: Diagnosis not present

## 2019-07-29 DIAGNOSIS — K449 Diaphragmatic hernia without obstruction or gangrene: Secondary | ICD-10-CM | POA: Diagnosis present

## 2019-07-29 DIAGNOSIS — E785 Hyperlipidemia, unspecified: Secondary | ICD-10-CM | POA: Diagnosis present

## 2019-07-29 DIAGNOSIS — Z7951 Long term (current) use of inhaled steroids: Secondary | ICD-10-CM | POA: Diagnosis not present

## 2019-07-29 DIAGNOSIS — Z87891 Personal history of nicotine dependence: Secondary | ICD-10-CM | POA: Diagnosis not present

## 2019-07-29 DIAGNOSIS — Z9111 Patient's noncompliance with dietary regimen: Secondary | ICD-10-CM | POA: Diagnosis not present

## 2019-07-29 DIAGNOSIS — I248 Other forms of acute ischemic heart disease: Secondary | ICD-10-CM

## 2019-07-29 DIAGNOSIS — I2510 Atherosclerotic heart disease of native coronary artery without angina pectoris: Secondary | ICD-10-CM

## 2019-07-29 DIAGNOSIS — E782 Mixed hyperlipidemia: Secondary | ICD-10-CM | POA: Diagnosis not present

## 2019-07-29 DIAGNOSIS — I4819 Other persistent atrial fibrillation: Secondary | ICD-10-CM | POA: Diagnosis present

## 2019-07-29 DIAGNOSIS — I4430 Unspecified atrioventricular block: Secondary | ICD-10-CM | POA: Diagnosis present

## 2019-07-29 DIAGNOSIS — Z96643 Presence of artificial hip joint, bilateral: Secondary | ICD-10-CM | POA: Diagnosis present

## 2019-07-29 DIAGNOSIS — I509 Heart failure, unspecified: Secondary | ICD-10-CM

## 2019-07-29 HISTORY — DX: Chronic atrial fibrillation, unspecified: I48.20

## 2019-07-29 LAB — HEPATIC FUNCTION PANEL
ALT: 23 U/L (ref 0–44)
AST: 21 U/L (ref 15–41)
Albumin: 3.5 g/dL (ref 3.5–5.0)
Alkaline Phosphatase: 82 U/L (ref 38–126)
Bilirubin, Direct: 0.3 mg/dL — ABNORMAL HIGH (ref 0.0–0.2)
Indirect Bilirubin: 1.4 mg/dL — ABNORMAL HIGH (ref 0.3–0.9)
Total Bilirubin: 1.7 mg/dL — ABNORMAL HIGH (ref 0.3–1.2)
Total Protein: 6.8 g/dL (ref 6.5–8.1)

## 2019-07-29 LAB — BASIC METABOLIC PANEL
Anion gap: 9 (ref 5–15)
BUN: 9 mg/dL (ref 8–23)
CO2: 26 mmol/L (ref 22–32)
Calcium: 9.3 mg/dL (ref 8.9–10.3)
Chloride: 106 mmol/L (ref 98–111)
Creatinine, Ser: 1.29 mg/dL — ABNORMAL HIGH (ref 0.61–1.24)
GFR calc Af Amer: >60 mL/min (ref >60)
GFR calc non Af Amer: 56 mL/min — ABNORMAL LOW (ref >60)
Glucose, Bld: 129 mg/dL — ABNORMAL HIGH (ref 70–99)
Potassium: 4.3 mmol/L (ref 3.5–5.1)
Sodium: 141 mmol/L (ref 135–145)

## 2019-07-29 LAB — CBC
HCT: 44.5 % (ref 39.0–52.0)
Hemoglobin: 14.1 g/dL (ref 13.0–17.0)
MCH: 29 pg (ref 26.0–34.0)
MCHC: 31.7 g/dL (ref 30.0–36.0)
MCV: 91.4 fL (ref 80.0–100.0)
Platelets: 294 10*3/uL (ref 150–400)
RBC: 4.87 MIL/uL (ref 4.22–5.81)
RDW: 14.7 % (ref 11.5–15.5)
WBC: 14.2 10*3/uL — ABNORMAL HIGH (ref 4.0–10.5)
nRBC: 0 % (ref 0.0–0.2)

## 2019-07-29 LAB — TROPONIN I (HIGH SENSITIVITY)
Troponin I (High Sensitivity): 52 ng/L — ABNORMAL HIGH (ref <18)
Troponin I (High Sensitivity): 58 ng/L — ABNORMAL HIGH (ref <18)

## 2019-07-29 LAB — BRAIN NATRIURETIC PEPTIDE: B Natriuretic Peptide: 362.2 pg/mL — ABNORMAL HIGH (ref 0.0–100.0)

## 2019-07-29 MED ORDER — SODIUM CHLORIDE 0.9% FLUSH: 3.0000 mL | INTRAVENOUS | Status: DC | PRN

## 2019-07-29 MED ORDER — RIVAROXABAN 20 MG PO TABS
20.0000 mg | ORAL_TABLET | Freq: Every day | ORAL | Status: DC
  Administered 2019-07-29 – 2019-07-30 (×2): 20 mg via ORAL
  Filled 2019-07-29 (×2): qty 1

## 2019-07-29 MED ORDER — MONTELUKAST SODIUM 10 MG PO TABS
10.0000 mg | ORAL_TABLET | Freq: Every day | ORAL | Status: DC
  Administered 2019-07-29 – 2019-07-30 (×2): 10 mg via ORAL
  Filled 2019-07-29 (×2): qty 1

## 2019-07-29 MED ORDER — NITROGLYCERIN 0.4 MG SL SUBL: 0.4000 mg | SUBLINGUAL_TABLET | SUBLINGUAL | Status: DC | PRN

## 2019-07-29 MED ORDER — SODIUM CHLORIDE 0.9% FLUSH: 3.0000 mL | Freq: Once | INTRAVENOUS | Status: DC

## 2019-07-29 MED ORDER — PRAVASTATIN SODIUM 40 MG PO TABS
80.0000 mg | ORAL_TABLET | Freq: Every evening | ORAL | Status: DC
  Administered 2019-07-29 – 2019-07-30 (×2): 80 mg via ORAL
  Filled 2019-07-29 (×2): qty 2

## 2019-07-29 MED ORDER — UMECLIDINIUM BROMIDE 62.5 MCG/INH IN AEPB
1.0000 | INHALATION_SPRAY | Freq: Every day | RESPIRATORY_TRACT | Status: DC
  Administered 2019-07-31: 1 via RESPIRATORY_TRACT
  Filled 2019-07-29 (×2): qty 7

## 2019-07-29 MED ORDER — FUROSEMIDE 10 MG/ML IJ SOLN
40.0000 mg | Freq: Once | INTRAMUSCULAR | Status: AC
  Administered 2019-07-29: 17:00:00 40 mg via INTRAVENOUS
  Filled 2019-07-29: qty 4

## 2019-07-29 MED ORDER — ONDANSETRON HCL 4 MG/2ML IJ SOLN: 4.0000 mg | Freq: Four times a day (QID) | INTRAMUSCULAR | Status: DC | PRN

## 2019-07-29 MED ORDER — SODIUM CHLORIDE 0.9 % IV SOLN: 250.0000 mL | INTRAVENOUS | Status: DC | PRN

## 2019-07-29 MED ORDER — FUROSEMIDE 10 MG/ML IJ SOLN
40.0000 mg | Freq: Two times a day (BID) | INTRAMUSCULAR | Status: DC
  Administered 2019-07-29 – 2019-07-30 (×2): 40 mg via INTRAVENOUS
  Filled 2019-07-29 (×3): qty 4

## 2019-07-29 MED ORDER — FLUTICASONE PROPIONATE 50 MCG/ACT NA SUSP
2.0000 | Freq: Every day | NASAL | Status: DC
  Administered 2019-07-30 – 2019-07-31 (×2): 2 via NASAL
  Filled 2019-07-29 (×2): qty 16

## 2019-07-29 MED ORDER — POTASSIUM CHLORIDE CRYS ER 20 MEQ PO TBCR
20.0000 meq | EXTENDED_RELEASE_TABLET | Freq: Every day | ORAL | Status: DC
  Administered 2019-07-29 – 2019-07-31 (×3): 20 meq via ORAL
  Filled 2019-07-29 (×3): qty 1

## 2019-07-29 MED ORDER — FUROSEMIDE 10 MG/ML IJ SOLN: 40.0000 mg | Freq: Two times a day (BID) | INTRAMUSCULAR | Status: DC

## 2019-07-29 MED ORDER — ZOLPIDEM TARTRATE 5 MG PO TABS: 5.0000 mg | ORAL_TABLET | Freq: Every evening | ORAL | Status: DC | PRN

## 2019-07-29 MED ORDER — LORATADINE 10 MG PO TABS
10.0000 mg | ORAL_TABLET | Freq: Every day | ORAL | Status: DC
  Administered 2019-07-29 – 2019-07-31 (×3): 10 mg via ORAL
  Filled 2019-07-29 (×3): qty 1

## 2019-07-29 MED ORDER — IOHEXOL 300 MG/ML  SOLN
75.0000 mL | Freq: Once | INTRAMUSCULAR | Status: AC | PRN
  Administered 2019-07-29: 14:00:00 75 mL via INTRAVENOUS

## 2019-07-29 MED ORDER — ALPRAZOLAM 0.25 MG PO TABS: 0.2500 mg | ORAL_TABLET | Freq: Two times a day (BID) | ORAL | Status: DC | PRN

## 2019-07-29 MED ORDER — IOHEXOL 350 MG/ML SOLN: 75.0000 mL | Freq: Once | INTRAVENOUS | Status: DC | PRN

## 2019-07-29 MED ORDER — ALBUTEROL SULFATE (2.5 MG/3ML) 0.083% IN NEBU: 2.5000 mg | INHALATION_SOLUTION | Freq: Four times a day (QID) | RESPIRATORY_TRACT | Status: DC | PRN

## 2019-07-29 MED ORDER — MOMETASONE FURO-FORMOTEROL FUM 200-5 MCG/ACT IN AERO
2.0000 | INHALATION_SPRAY | Freq: Two times a day (BID) | RESPIRATORY_TRACT | Status: DC
  Administered 2019-07-29 – 2019-07-31 (×4): 2 via RESPIRATORY_TRACT
  Filled 2019-07-29: qty 8.8

## 2019-07-29 MED ORDER — ISOSORBIDE MONONITRATE ER 30 MG PO TB24
30.0000 mg | ORAL_TABLET | Freq: Every day | ORAL | Status: DC
  Administered 2019-07-30 – 2019-07-31 (×2): 30 mg via ORAL
  Filled 2019-07-29 (×2): qty 1

## 2019-07-29 MED ORDER — ACETAMINOPHEN 325 MG PO TABS: 650.0000 mg | ORAL_TABLET | ORAL | Status: DC | PRN

## 2019-07-29 MED ORDER — SODIUM CHLORIDE 0.9% FLUSH
3.0000 mL | Freq: Two times a day (BID) | INTRAVENOUS | Status: DC
  Administered 2019-07-29 – 2019-07-31 (×4): 3 mL via INTRAVENOUS

## 2019-07-29 MED ORDER — TIOTROPIUM BROMIDE MONOHYDRATE 18 MCG IN CAPS: 18.0000 ug | ORAL_CAPSULE | Freq: Every day | RESPIRATORY_TRACT | Status: DC

## 2019-07-29 NOTE — ED Notes (Signed)
Patient transported to CT 

## 2019-07-29 NOTE — H&P (Addendum)
Cardiology Consultation:   Patient ID: Marvin Chandler; QR:[id number]; [birth year]   Admit date: 07/29/2019 Date of Consult: 07/29/2019  Primary Care Provider: [facility], [facility] Primary Cardiologist: Marvin Chandler, MD 06/14/2019 Primary Electrophysiologist:  None   Patient Profile:   Marvin Chandler is a 69 y.o. male with a hx of non-obs CAD, NICM w/ EF 45%, OA, S-CHF, A. fib on Xarelto, AV block so not on AV nodal blocking agents, COVID 05/2019 s/p vaccination shots x 2, who is being seen today for the evaluation of chest pain/SOB at the request of Marvin Chandler.  History of Present Illness:   Marvin Chandler was cardiac stable when seen by Marvin. Angelena Chandler on 2/12.  He was in atrial fibrillation, with heart rates in the 90s.  Marvin Chandler feels that he was doing well until about a week ago.  He then started noticing increasing dyspnea on exertion.  His symptoms progressed, he developed orthopnea and PND.  For the last few days, he has been sitting up at night because he cannot get any rest at all if he tries to lie down.  About 3 days ago, he started noticing some increasing lower extremity edema.  This is something he has not had before.  He denies weight gain, says he has lost about 7 pounds in the last month or so.  He admits to dietary indiscretion, does not watch the salt closely.  He likes fried chicken, [place] food.  Says he has [place] food about once a week. He also recently had foot-long hotdogs.  He developed chest pain 3 days ago.  The chest pain is a tightness, and at the lower edge of the sternum.  This area is a little tender.  It is not exertional, the first episode started at rest.  In the last 3 days, he has taken a total of 7 nitroglycerin for it.  Each episode would go away with 1 nitroglycerin.  There was also some episodes that were less severe but resolved without intervention.  Today, he had an episode of chest pain that was a little worse than the rest.  It reached a 6  or 7/10.  Also, the shortness of breath was getting to him.  That is why he finally came to the emergency room.  In the emergency room, he has had Lasix 40 mg IV.  He took a nitroglycerin before coming in.  Currently, he is having just a small amount of chest tightness, approximately 2/10.  His shortness of breath is improved, but he is not at baseline.   Past Medical History:  Diagnosis Date  . Arthritis of hip    bilateral  . Asthma   . CAD (coronary artery disease)    LHC 5/16:  Dx ostial 30%, LAD luminal irregs, EF 40%  . Chronic atrial fibrillation ([place]) 07/29/2019  . Chronic systolic CHF (congestive heart failure) ([place])    a. Echo 3/16:  inf and inf-lat HK, mild LVH, EF 45%, mild to mod LAE, normal RVF  . Cough   . Hiatal hernia   . NICM (nonischemic cardiomyopathy) ([place])   . Osteoarthritis   . Personal history of colonic adenomas 02/20/2013  . Pneumonia     Past Surgical History:  Procedure Laterality Date  . COLONOSCOPY  02/13/13  . LEFT AND RIGHT HEART CATHETERIZATION WITH CORONARY ANGIOGRAM N/A 08/18/2014   Procedure: LEFT AND RIGHT HEART CATHETERIZATION WITH CORONARY ANGIOGRAM;  Surgeon: [name], MD;  Location: [facility] CATH LAB;  Service: Cardiovascular;  Laterality: N/A;  . LEFT HEART CATH AND CORONARY ANGIOGRAPHY N/A 11/09/2017   Procedure: LEFT HEART CATH AND CORONARY ANGIOGRAPHY;  Surgeon: [name], MD;  Location: [facility] CV LAB;  Service: Cardiovascular;  Laterality: N/A;  . TOTAL HIP ARTHROPLASTY  2002   bilateral     Prior to Admission medications   Medication Sig Start Date End Date Taking? Authorizing Provider  albuterol (PROVENTIL) (2.5 MG/3ML) 0.083% nebulizer solution Take 3 mLs (2.5 mg total) by nebulization every 6 (six) hours as needed for wheezing or shortness of breath. 07/22/14  Yes Theodis Blaze, MD  albuterol (VENTOLIN HFA) 108 (90 Base) MCG/ACT inhaler Inhale 2 puffs into the lungs every 4 (four) hours as needed. 06/13/16  Yes  Brand Males, MD  budesonide-formoterol (SYMBICORT) 160-4.5 MCG/ACT inhaler Inhale 2 puffs into the lungs daily.   Yes [provider]  cetirizine (ZYRTEC) 10 MG tablet Take 10 mg by mouth daily.   Yes [provider]  EPINEPHrine 0.3 mg/0.3 mL IJ SOAJ injection Inject 0.3 mLs (0.3 mg total) into the muscle once. 08/05/15  Yes Young, Tarri Fuller D, MD  fluticasone (FLONASE) 50 MCG/ACT nasal spray Place 2 sprays into both nostrils daily. 07/11/16  Yes Brand Males, MD  furosemide (LASIX) 40 MG tablet Take 1 tablet (40 mg total) by mouth 2 (two) times daily. 06/22/16  Yes Burnell Blanks, MD  ibuprofen (ADVIL,MOTRIN) 200 MG tablet Take 400 mg by mouth every 6 (six) hours as needed for moderate pain.   Yes [provider]  losartan (COZAAR) 50 MG tablet Take 1 tablet (50 mg total) by mouth daily. 06/22/16  Yes Burnell Blanks, MD  montelukast (SINGULAIR) 10 MG tablet TAKE 1 TABLET BY MOUTH AT BEDTIME 08/02/16  Yes Brand Males, MD  nitroGLYCERIN (NITROSTAT) 0.4 MG SL tablet Place 1 tablet (0.4 mg total) under the tongue every 5 (five) minutes as needed. Patient taking differently: Place 0.4 mg under the tongue every 5 (five) minutes as needed for chest pain.  11/10/17  Yes Lyda Jester M, PA-C  pravastatin (PRAVACHOL) 80 MG tablet Take 1 tablet (80 mg total) by mouth every evening. 06/24/16  Yes Burnell Blanks, MD  rivaroxaban (XARELTO) 20 MG TABS tablet Take 1 tablet (20 mg total) by mouth daily with supper. 06/22/16  Yes Burnell Blanks, MD  SPIRIVA HANDIHALER 18 MCG inhalation capsule INHALE THE CONTENTS OF 1 CAPSULE VIA HANDIHALER BY MOUTH DAILY Patient taking differently: Place 18 mcg into inhaler and inhale daily.  06/09/16  Yes Brand Males, MD  guaiFENesin-codeine 100-10 MG/5ML syrup Take 5-10 mLs by mouth every 6 (six) hours as needed for cough. Patient not taking: Reported on 07/29/2019 05/13/19   Margarita Mail, PA-C   Nebulizers MISC by Does not apply route 4 (four) times daily. Use with nebulizer four times a day as needed for wheezing or sob    [provider]  omeprazole (PRILOSEC) 40 MG capsule Take 1 capsule by mouth every morning on an empty stomach. Take 30 minutes before eating. Patient not taking: Reported on 07/29/2019 12/31/13   Barton Fanny, MD  sildenafil (VIAGRA) 50 MG tablet Take 1 tablet (50 mg total) by mouth daily as needed for erectile dysfunction. Patient not taking: Reported on 07/29/2019 12/18/14   Burnell Blanks, MD    Inpatient Medications: Scheduled Meds: . omalizumab  375 mg Subcutaneous Once  . sodium chloride flush  3 mL Intravenous Once   Continuous Infusions:  PRN Meds:  Allergies:   No Known Allergies  Social History:   Social History   Socioeconomic History  . Marital status: Married    Spouse name: Not on file  . Number of children: 2  . Years of education: Not on file  . Highest education level: Not on file  Occupational History  . Occupation: Scientist, water quality: [employer]  Tobacco Use  . Smoking status: Former Smoker    Packs/day: 1.00    Years: 7.00    Pack years: 7.00    Types: Cigarettes    Quit date: 11/05/1997    Years since quitting: 21.7  . Smokeless tobacco: Never Used  Substance and Sexual Activity  . Alcohol use: No    Alcohol/week: 0.0 standard drinks  . Drug use: No  . Sexual activity: Not on file  Other Topics Concern  . Not on file  Social History Narrative  . Not on file   Social Determinants of Health   Financial Resource Strain:   . Difficulty of Paying Living Expenses:   Food Insecurity:   . Worried About Charity fundraiser in the Last Year:   . Arboriculturist in the Last Year:   Transportation Needs:   . Film/video editor (Medical):   Marland Kitchen Lack of Transportation (Non-Medical):   Physical Activity:   . Days of Exercise per Week:   . Minutes of Exercise per  Session:   Stress:   . Feeling of Stress :   Social Connections:   . Frequency of Communication with Friends and Family:   . Frequency of Social Gatherings with Friends and Family:   . Attends Religious Services:   . Active Member of Clubs or Organizations:   . Attends Archivist Meetings:   Marland Kitchen Marital Status:   Intimate Partner Violence:   . Fear of Current or Ex-Partner:   . Emotionally Abused:   Marland Kitchen Physically Abused:   . Sexually Abused:     Family History:   Family History  Problem Relation Age of Onset  . Dementia Mother   . Cancer Other   . Stroke Cousin   . Cancer Maternal Aunt   . Diabetes Cousin   . Colon cancer Neg Hx   . Esophageal cancer Neg Hx   . Heart attack Neg Hx   . Hypertension Neg Hx    Family Status:  Family Status  Relation Name Status  . Mother  Alive  . Daughter  Alive  . Daughter  Alive  . Father  Other       UNKNOWN  . Other  (Not Specified)  . Cousin  (Not Specified)  . Mat Aunt  (Not Specified)  . Cousin  (Not Specified)  . Neg Hx  (Not Specified)    ROS:  Please see the history of present illness.  All other ROS reviewed and negative.     Physical Exam/Data:   Vitals:   07/29/19 1245 07/29/19 1300 07/29/19 1330 07/29/19 1625  BP: (!) 142/102 (!) 141/77 135/73 136/72  Pulse: (!) 103 (!) 103 93 95  Resp: (!) 26 (!) 26 12 (!) 30  Temp:      TempSrc:      SpO2: 95% 93% 95% 96%  Weight:      Height:        Intake/Output Summary (Last 24 hours) at 07/29/2019 1716 Last data filed at 07/29/2019 1642 Gross per 24 hour  Intake --  Output 250 ml  Net -250 ml    Last 3 Weights 07/29/2019 06/14/2019 11/10/2017  Weight (lbs) 215 lb 222 lb 1.9 oz 227 lb 14.4 oz  Weight (kg) 97.523 kg 100.753 kg 103.375 kg     Body mass index is 29.99 kg/m.   General:  Well nourished, well developed, male in no acute distress HEENT: normal Lymph: no adenopathy Neck: JVD -9-10 cm but difficult to assess 2nd body habitus Endocrine:  No  thryomegaly Vascular: No carotid bruits; upper extremity pulses 2+  Cardiac:  normal S1, S2; irregular rate and rhythm; no murmur Lungs: Decreased breath sounds bases bilaterally, no wheezing, rhonchi, scattered rales  Abd: soft, nontender, no hepatomegaly  Ext: Trace-1+ lower extremity edema Musculoskeletal:  No deformities, BUE and BLE strength normal and equal Skin: warm and dry  Neuro:  CNs 2-12 intact, no focal abnormalities noted Psych:  Normal affect   EKG:  The EKG was personally reviewed and demonstrates: Atrial fibrillation, RVR, heart rate 106, no acute ischemic changes Telemetry:  Telemetry was personally reviewed and demonstrates: Atrial fib   CV studies:   ECHO: 11/09/2017  - Left ventricle: LVEF is approximately 35 to 40% with diffuse  hypokinesis, worse in the inferior and posterior walls. Compared  to echo from 2016, LVEF is a little more depressed. The cavity  size was normal. Wall thickness was increased in a pattern of  mild LVH.   CATH: 11/09/2017  Prox LAD lesion is 30% stenosed. IMPRESSION: Mr. Scacco has noncritical CAD with minimal disease in his LAD.  His filling pressures only mildly elevated.  These are similar to the findings demonstrated by right and left heart cath performed by Marvin. Angelena Chandler 08/18/2014.  I believe his pain is noncardiac.  Medical therapy will be recommended.       Laboratory Data:   Chemistry Recent Labs  Lab 07/29/19 1211  NA 141  K 4.3  CL 106  CO2 26  GLUCOSE 129*  BUN 9  CREATININE 1.29*  CALCIUM 9.3  GFRNONAA 56*  GFRAA >60  ANIONGAP 9    Lab Results  Component Value Date   ALT 9 06/14/2019   AST 15 06/14/2019   ALKPHOS 101 06/14/2019   BILITOT 0.5 06/14/2019   Hematology Recent Labs  Lab 07/29/19 1211  WBC 14.2*  RBC 4.87  HGB 14.1  HCT 44.5  MCV 91.4  MCH 29.0  MCHC 31.7  RDW 14.7  PLT 294   Cardiac Enzymes High Sensitivity Troponin:   Recent Labs  Lab 07/29/19 1211 07/29/19 1350   TROPONINIHS 52* 58*      BNP Recent Labs  Lab 07/29/19 1513  BNP 362.2*    TSH:  Lab Results  Component Value Date   TSH 2.179 12/31/2013   Lipids: Lab Results  Component Value Date   CHOL 75 (L) 06/14/2019   HDL 30 (L) 06/14/2019   LDLCALC 32 06/14/2019   TRIG 52 06/14/2019   CHOLHDL 2.5 06/14/2019   HgbA1c: Lab Results  Component Value Date   HGBA1C 6.5 (H) 11/09/2017   Magnesium: No results found for: MG   Radiology/Studies:  DG Chest 2 View  Result Date: 07/29/2019 CLINICAL DATA:  Chest pain, shortness of breath. Additional history provided: Shortness of breath, patient reports unable to walk a short distance with shortness of breath, treated for "mild heart attack" approximately 1 year ago. EXAM: CHEST - 2 VIEW COMPARISON:  Chest radiograph 05/11/2019 FINDINGS: Heart size within normal limits. Aortic atherosclerosis. Ill-defined airspace opacities  within the right mid to lower lung field. These opacities have progressed within the perihilar right lung as compared to chest radiograph 05/11/2019. The left lung is clear. No evidence of pleural effusion or pneumothorax. No acute bony abnormality IMPRESSION: Ill-defined airspace opacities within the right mid to lower lung field. These opacities have progressed within the perihilar right lung as compared to 05/11/2019. Findings are nonspecific but may reflect pneumonia or asymmetric edema. Close radiographic follow-up is recommended. Alternatively, CT may be helpful for further characterization. Electronically Signed   By: Kellie Simmering DO   On: 07/29/2019 12:39   CT Chest W Contrast  Result Date: 07/29/2019 CLINICAL DATA:  Intermittent chest pain and shortness, lower of breath extremity swelling for several days. EXAM: CT CHEST WITH CONTRAST TECHNIQUE: Multidetector CT imaging of the chest was performed during intravenous contrast administration. CONTRAST:  75 mL OMNIPAQUE IOHEXOL 300 MG/ML  SOLN COMPARISON:  CT chest  11/09/2017. PA and lateral chest today and 11/08/2017. FINDINGS: Cardiovascular: Heart size is upper normal. Left atrial enlargement is seen. There is calcific aortic and coronary atherosclerosis. No pericardial effusion. Mediastinum/Nodes: No enlarged mediastinal, hilar, or axillary lymph nodes. Thyroid gland, trachea, and esophagus demonstrate no significant findings. Lungs/Pleura: The patient has small bilateral pleural effusions, greater on the right. Fairly extensive emphysematous disease is present. Ground-glass attenuation is seen throughout the right upper and lower lobes. Patchy, milder ground-glass attenuation is seen in the right middle lobe and left upper and lower lobes. Upper Abdomen: Cysts in the upper pole of the right kidney are noted. Musculoskeletal: No acute or focal bony abnormality. IMPRESSION: Right greater than left ground-glass attenuation is nonspecific but could reflect asymmetric edema, pneumonia or hypersensitivity pneumonitis. Small pleural effusions, larger on the right. Aortic Atherosclerosis (ICD10-I70.0) and Emphysema (ICD10-J43.9). Calcific coronary artery disease also noted. Electronically Signed   By: Inge Rise M.D.   On: 07/29/2019 14:37    Assessment and Plan:   1.  Acute on chronic systolic CHF: -He is breathing better after 1 dose of Lasix 40 mg IV, continue this twice daily -Recheck chest x-ray in 24-48 hours to make sure that there is no infection disguised by the edema -Follow daily weights, renal function, intake/output -Nutrition consult to give him some options for lower sodium eating - he is not on a beta-blocker because a history of AV block -He is on losartan 50 mg daily, but he is not on hydralazine or nitrates - Cr is up a little, hold the losartan for now -Because of his chest pain and CHF, will add Imdur 30 mg daily  2.  Chest pain, moderate risk for cardiac etiology: -No significant CAD by cardiac cath 03/2018 -Mild elevation in troponin  could also be entirely consistent with CHF -His description of his symptoms may be consistent with spasm or just from the CHF -Add Imdur -Doubt ischemic eval needed  3.  Chronic atrial fibrillation: -Heart rate is up a little, but that is consistent with his acute illness -No AV nodal blocking agents -Continue Xarelto for CHA2DS2-VASc of 4 (age x 1, CHF, HTN, CAD)   Principal Problem:   Acute on chronic systolic CHF (congestive heart failure) (HCC) Active Problems:   Chest pain with moderate risk for cardiac etiology   Chronic atrial fibrillation ([place])     For questions or updates, please contact [facility] Please consult [email or web address] for contact info under Cardiology/STEMI.   Jonetta Speak, PA-C  07/29/2019 5:16 PM  Attending Note:   The patient  was seen and examined.  Agree with assessment and plan as noted above.  Changes made to the above note as needed.  Patient seen and independently examined with  Rosaria Ferries, PA .   We discussed all aspects of the encounter. I agree with the assessment and plan as stated above.  1.  .  Acute on chronic combined CHF : The patient has known chronic heart failure and his symptoms are consistent with acute on chronic combined systolic and diastolic congestive heart failure.  He has known ejection fraction of around 45%.  He has been eating lots of salty foods recently because he and his wife are both getting over Covid in January.  He describes eating foot long hot dogs earlier this week one after the other.  He also describes eating [place] food, fried chicken and other takeout foods on a regular basis.  He started having progressive shortness of breath associated with some mild chest tightness.  He describes some leg edema.  He does not describe any real angina symptoms.  He had a nonproductive cough.  CT scan reveals no pulmonary embolus.  He has right greater than left groundglass appearance in his right lung.  I think we  should continue with Lasix.  He had Covid in January so I doubt that this is a repeat Covid infection.  I had a long talk with him about restricting his salt intake.  He admits that this is going to be tough to watch his salt.  We will repeat his echocardiogram for further evaluation of his LV function   2.  Nonspecific chest pain:  CAD :  He has history of very mild coronary artery disease.  We will check troponins.  I do not think that any further ischemic evaluation is warranted at this time unless he has further episodes of chest discomfort.  3.  Atrial  fibrillation: Heart rate is up a little bit.  Continue Xarelto.  His CHA2DS2-VASc score is four ( age, CHF, HTN, CAD)    I have spent a total of 40 minutes with patient reviewing hospital  notes , telemetry, EKGs, labs and examining patient as well as establishing an assessment and plan that was discussed with the patient. > 50% of time was spent in direct patient care.    Thayer Headings, Brooke Bonito., MD, [facility] 07/29/2019, 5:55 PM [address],  [place] Pager [phone]

## 2019-07-29 NOTE — ED Triage Notes (Signed)
Pt reports having hx of MI. Having intermittent chest pains, swelling to extremities and sob for several days. Airway intact and ekg done at triage.

## 2019-07-29 NOTE — ED Provider Notes (Signed)
[facility] EMERGENCY DEPARTMENT Provider Note   CSN: [id number] Arrival date & time: 07/29/19  1149     History Chief Complaint  Patient presents with  . Chest Pain  . Shortness of Breath    Marvin Chandler is a 69 y.o. male.  The history is provided by the patient. No language interpreter was used.  Chest Pain Pain severity:  Moderate Onset quality:  Gradual Timing:  Constant Progression:  Worsening Relieved by:  Nothing Worsened by:  Nothing Ineffective treatments:  None tried Associated symptoms: shortness of breath   Risk factors: high cholesterol and hypertension   Shortness of Breath Associated symptoms: chest pain   Pt complains of shortness of breath. Pt reports he feels short of breath with walking.       Past Medical History:  Diagnosis Date  . Arthritis of hip    bilateral  . Asthma   . CAD (coronary artery disease)    LHC 5/16:  Dx ostial 30%, LAD luminal irregs, EF 40%  . Chronic atrial fibrillation ([place]) 07/29/2019  . Chronic systolic CHF (congestive heart failure) ([place])    a. Echo 3/16:  inf and inf-lat HK, mild LVH, EF 45%, mild to mod LAE, normal RVF  . Cough   . Hiatal hernia   . NICM (nonischemic cardiomyopathy) ([place])   . Osteoarthritis   . Personal history of colonic adenomas 02/20/2013  . Pneumonia     Patient Active Problem List   Diagnosis Date Noted  . Acute on chronic systolic CHF (congestive heart failure) ([place]) 07/29/2019  . Chronic atrial fibrillation ([place]) 07/29/2019  . Non-cardiac chest pain 11/08/2017  . Asthma with acute exacerbation 10/06/2014  . History of asthma 08/15/2014  . Hemoptysis 08/15/2014  . Chronic cough 08/15/2014  . Cardiomyopathy ([place]) 08/13/2014  . Asthma exacerbation 07/16/2014  . Pleuritic chest pain 07/16/2014  . Asthma, chronic 02/19/2014  . Personal history of colonic adenomas 02/20/2013  . Dyspnea 12/16/2011  . Chest pain with moderate risk for cardiac etiology 12/16/2011  .  HIATAL HERNIA 07/11/2006  . OSTEOARTHRITIS 07/11/2006  . ARTHRITIS, HIPS, BILATERAL 07/11/2006    Past Surgical History:  Procedure Laterality Date  . COLONOSCOPY  02/13/13  . LEFT AND RIGHT HEART CATHETERIZATION WITH CORONARY ANGIOGRAM N/A 08/18/2014   Procedure: LEFT AND RIGHT HEART CATHETERIZATION WITH CORONARY ANGIOGRAM;  Surgeon: [name], MD;  Location: [facility] CATH LAB;  Service: Cardiovascular;  Laterality: N/A;  . LEFT HEART CATH AND CORONARY ANGIOGRAPHY N/A 11/09/2017   Procedure: LEFT HEART CATH AND CORONARY ANGIOGRAPHY;  Surgeon: [name], MD;  Location: [facility] CV LAB;  Service: Cardiovascular;  Laterality: N/A;  . TOTAL HIP ARTHROPLASTY  2002   bilateral       Family History  Problem Relation Age of Onset  . Dementia Mother   . Cancer Other   . Stroke Cousin   . Cancer Maternal Aunt   . Diabetes Cousin   . Colon cancer Neg Hx   . Esophageal cancer Neg Hx   . Heart attack Neg Hx   . Hypertension Neg Hx     Social History   Tobacco Use  . Smoking status: Former Smoker    Packs/day: 1.00    Years: 7.00    Pack years: 7.00    Types: Cigarettes    Quit date: 11/05/1997    Years since quitting: 21.7  . Smokeless tobacco: Never Used  Substance Use Topics  . Alcohol use: No  Alcohol/week: 0.0 standard drinks  . Drug use: No    Home Medications Prior to Admission medications   Medication Sig Start Date End Date Taking? Authorizing Provider  albuterol (PROVENTIL) (2.5 MG/3ML) 0.083% nebulizer solution Take 3 mLs (2.5 mg total) by nebulization every 6 (six) hours as needed for wheezing or shortness of breath. 07/22/14  Yes Theodis Blaze, MD  albuterol (VENTOLIN HFA) 108 (90 Base) MCG/ACT inhaler Inhale 2 puffs into the lungs every 4 (four) hours as needed. 06/13/16  Yes Brand Males, MD  budesonide-formoterol (SYMBICORT) 160-4.5 MCG/ACT inhaler Inhale 2 puffs into the lungs daily.   Yes [provider]  cetirizine (ZYRTEC) 10 MG  tablet Take 10 mg by mouth daily.   Yes [provider]  EPINEPHrine 0.3 mg/0.3 mL IJ SOAJ injection Inject 0.3 mLs (0.3 mg total) into the muscle once. 08/05/15  Yes Young, Tarri Fuller D, MD  fluticasone (FLONASE) 50 MCG/ACT nasal spray Place 2 sprays into both nostrils daily. 07/11/16  Yes Brand Males, MD  furosemide (LASIX) 40 MG tablet Take 1 tablet (40 mg total) by mouth 2 (two) times daily. 06/22/16  Yes Burnell Blanks, MD  ibuprofen (ADVIL,MOTRIN) 200 MG tablet Take 400 mg by mouth every 6 (six) hours as needed for moderate pain.   Yes [provider]  losartan (COZAAR) 50 MG tablet Take 1 tablet (50 mg total) by mouth daily. 06/22/16  Yes Burnell Blanks, MD  montelukast (SINGULAIR) 10 MG tablet TAKE 1 TABLET BY MOUTH AT BEDTIME 08/02/16  Yes Brand Males, MD  nitroGLYCERIN (NITROSTAT) 0.4 MG SL tablet Place 1 tablet (0.4 mg total) under the tongue every 5 (five) minutes as needed. Patient taking differently: Place 0.4 mg under the tongue every 5 (five) minutes as needed for chest pain.  11/10/17  Yes Lyda Jester M, PA-C  pravastatin (PRAVACHOL) 80 MG tablet Take 1 tablet (80 mg total) by mouth every evening. 06/24/16  Yes Burnell Blanks, MD  rivaroxaban (XARELTO) 20 MG TABS tablet Take 1 tablet (20 mg total) by mouth daily with supper. 06/22/16  Yes Burnell Blanks, MD  SPIRIVA HANDIHALER 18 MCG inhalation capsule INHALE THE CONTENTS OF 1 CAPSULE VIA HANDIHALER BY MOUTH DAILY Patient taking differently: Place 18 mcg into inhaler and inhale daily.  06/09/16  Yes Brand Males, MD  guaiFENesin-codeine 100-10 MG/5ML syrup Take 5-10 mLs by mouth every 6 (six) hours as needed for cough. Patient not taking: Reported on 07/29/2019 05/13/19   Margarita Mail, PA-C  Nebulizers MISC by Does not apply route 4 (four) times daily. Use with nebulizer four times a day as needed for wheezing or sob    [provider]  omeprazole (PRILOSEC) 40  MG capsule Take 1 capsule by mouth every morning on an empty stomach. Take 30 minutes before eating. Patient not taking: Reported on 07/29/2019 12/31/13   Barton Fanny, MD  sildenafil (VIAGRA) 50 MG tablet Take 1 tablet (50 mg total) by mouth daily as needed for erectile dysfunction. Patient not taking: Reported on 07/29/2019 12/18/14   Burnell Blanks, MD    Allergies    Patient has no known allergies.  Review of Systems   Review of Systems  Respiratory: Positive for shortness of breath.   Cardiovascular: Positive for chest pain.  All other systems reviewed and are negative.   Physical Exam Updated Vital Signs BP 127/90 (BP Location: Right Arm)   Pulse 95   Temp 98 F (36.7 C) (Oral)   Resp 19  Ht 5\' 11"  (1.803 m)   Wt 97.5 kg   SpO2 96%   BMI 29.99 kg/m   Physical Exam Vitals and nursing note reviewed.  Constitutional:      Appearance: He is well-developed.  HENT:     Head: Normocephalic and atraumatic.  Eyes:     Conjunctiva/sclera: Conjunctivae normal.  Cardiovascular:     Rate and Rhythm: Normal rate and regular rhythm.     Heart sounds: Normal heart sounds. No murmur.  Pulmonary:     Effort: Pulmonary effort is normal. No respiratory distress.     Breath sounds: Normal breath sounds.  Chest:     Chest wall: No tenderness.  Abdominal:     Palpations: Abdomen is soft.     Tenderness: There is no abdominal tenderness.  Musculoskeletal:        General: Normal range of motion.     Cervical back: Neck supple.  Skin:    General: Skin is warm and dry.  Neurological:     General: No focal deficit present.     Mental Status: He is alert.  Psychiatric:        Mood and Affect: Mood normal.     ED Results / Procedures / Treatments   Labs (all labs ordered are listed, but only abnormal results are displayed) Labs Reviewed  BASIC METABOLIC PANEL - Abnormal; Notable for the following components:      Result Value   Glucose, Bld 129 (*)     Creatinine, Ser 1.29 (*)    GFR calc non Af Amer 56 (*)    All other components within normal limits  CBC - Abnormal; Notable for the following components:   WBC 14.2 (*)    All other components within normal limits  BRAIN NATRIURETIC PEPTIDE - Abnormal; Notable for the following components:   B Natriuretic Peptide 362.2 (*)    All other components within normal limits  TROPONIN I (HIGH SENSITIVITY) - Abnormal; Notable for the following components:   Troponin I (High Sensitivity) 52 (*)    All other components within normal limits  TROPONIN I (HIGH SENSITIVITY) - Abnormal; Notable for the following components:   Troponin I (High Sensitivity) 58 (*)    All other components within normal limits    EKG None  Radiology DG Chest 2 View  Result Date: 07/29/2019 CLINICAL DATA:  Chest pain, shortness of breath. Additional history provided: Shortness of breath, patient reports unable to walk a short distance with shortness of breath, treated for "mild heart attack" approximately 1 year ago. EXAM: CHEST - 2 VIEW COMPARISON:  Chest radiograph 05/11/2019 FINDINGS: Heart size within normal limits. Aortic atherosclerosis. Ill-defined airspace opacities within the right mid to lower lung field. These opacities have progressed within the perihilar right lung as compared to chest radiograph 05/11/2019. The left lung is clear. No evidence of pleural effusion or pneumothorax. No acute bony abnormality IMPRESSION: Ill-defined airspace opacities within the right mid to lower lung field. These opacities have progressed within the perihilar right lung as compared to 05/11/2019. Findings are nonspecific but may reflect pneumonia or asymmetric edema. Close radiographic follow-up is recommended. Alternatively, CT may be helpful for further characterization. Electronically Signed   By: Kellie Simmering DO   On: 07/29/2019 12:39   CT Chest W Contrast  Result Date: 07/29/2019 CLINICAL DATA:  Intermittent chest pain and  shortness, lower of breath extremity swelling for several days. EXAM: CT CHEST WITH CONTRAST TECHNIQUE: Multidetector CT imaging of the chest was  performed during intravenous contrast administration. CONTRAST:  75 mL OMNIPAQUE IOHEXOL 300 MG/ML  SOLN COMPARISON:  CT chest 11/09/2017. PA and lateral chest today and 11/08/2017. FINDINGS: Cardiovascular: Heart size is upper normal. Left atrial enlargement is seen. There is calcific aortic and coronary atherosclerosis. No pericardial effusion. Mediastinum/Nodes: No enlarged mediastinal, hilar, or axillary lymph nodes. Thyroid gland, trachea, and esophagus demonstrate no significant findings. Lungs/Pleura: The patient has small bilateral pleural effusions, greater on the right. Fairly extensive emphysematous disease is present. Ground-glass attenuation is seen throughout the right upper and lower lobes. Patchy, milder ground-glass attenuation is seen in the right middle lobe and left upper and lower lobes. Upper Abdomen: Cysts in the upper pole of the right kidney are noted. Musculoskeletal: No acute or focal bony abnormality. IMPRESSION: Right greater than left ground-glass attenuation is nonspecific but could reflect asymmetric edema, pneumonia or hypersensitivity pneumonitis. Small pleural effusions, larger on the right. Aortic Atherosclerosis (ICD10-I70.0) and Emphysema (ICD10-J43.9). Calcific coronary artery disease also noted. Electronically Signed   By: Inge Rise M.D.   On: 07/29/2019 14:37    Procedures Procedures (including critical care time)  Medications Ordered in ED Medications  sodium chloride flush (NS) 0.9 % injection 3 mL (3 mLs Intravenous Not Given 07/29/19 1304)  albuterol (PROVENTIL) (2.5 MG/3ML) 0.083% nebulizer solution 2.5 mg (has no administration in time range)  mometasone-formoterol (DULERA) 200-5 MCG/ACT inhaler 2 puff (has no administration in time range)  loratadine (CLARITIN) tablet 10 mg (has no administration in time  range)  fluticasone (FLONASE) 50 MCG/ACT nasal spray 2 spray (2 sprays Each Nare Not Given 07/29/19 1807)  montelukast (SINGULAIR) tablet 10 mg (has no administration in time range)  pravastatin (PRAVACHOL) tablet 80 mg (has no administration in time range)  rivaroxaban (XARELTO) tablet 20 mg (has no administration in time range)  isosorbide mononitrate (IMDUR) 24 hr tablet 30 mg (has no administration in time range)  furosemide (LASIX) injection 40 mg (has no administration in time range)  potassium chloride SA (KLOR-CON) CR tablet 20 mEq (has no administration in time range)  umeclidinium bromide (INCRUSE ELLIPTA) 62.5 MCG/INH 1 puff (has no administration in time range)  iohexol (OMNIPAQUE) 300 MG/ML solution 75 mL (75 mLs Intravenous Contrast Given 07/29/19 1413)  furosemide (LASIX) injection 40 mg (40 mg Intravenous Given 07/29/19 1631)    ED Course  I have reviewed the triage vital signs and the nursing notes.  Pertinent labs & imaging results that were available during my care of the patient were reviewed by me and considered in my medical decision making (see chart for details).    MDM Rules/Calculators/A&P                      MDM:  Ekg no acute abnormality, troponin and BNP is elevated.  Chest xray shows possible infection.  Ct scan shows opacities.   Cardiologist in to see and examine  Final Clinical Impression(s) / ED Diagnoses Final diagnoses:  Congestive heart failure, unspecified HF chronicity, unspecified heart failure type [facility])  Shortness of breath    Rx / DC Orders ED Discharge Orders    None       Sidney Ace 07/29/19 Arne Cleveland, MD 08/01/19 [phone]

## 2019-07-30 ENCOUNTER — Inpatient Hospital Stay (HOSPITAL_COMMUNITY): Payer: No Typology Code available for payment source

## 2019-07-30 DIAGNOSIS — I5021 Acute systolic (congestive) heart failure: Secondary | ICD-10-CM

## 2019-07-30 DIAGNOSIS — E782 Mixed hyperlipidemia: Secondary | ICD-10-CM

## 2019-07-30 LAB — BASIC METABOLIC PANEL
Anion gap: 9 (ref 5–15)
BUN: 11 mg/dL (ref 8–23)
CO2: 26 mmol/L (ref 22–32)
Calcium: 9 mg/dL (ref 8.9–10.3)
Chloride: 105 mmol/L (ref 98–111)
Creatinine, Ser: 1.27 mg/dL — ABNORMAL HIGH (ref 0.61–1.24)
GFR calc Af Amer: >60 mL/min (ref >60)
GFR calc non Af Amer: 57 mL/min — ABNORMAL LOW (ref >60)
Glucose, Bld: 147 mg/dL — ABNORMAL HIGH (ref 70–99)
Potassium: 3.8 mmol/L (ref 3.5–5.1)
Sodium: 140 mmol/L (ref 135–145)

## 2019-07-30 LAB — ECHOCARDIOGRAM COMPLETE
Height: 71 in
Weight: 3440 oz

## 2019-07-30 LAB — HIV ANTIBODY (ROUTINE TESTING W REFLEX): HIV Screen 4th Generation wRfx: NONREACTIVE

## 2019-07-30 MED ORDER — FUROSEMIDE 10 MG/ML IJ SOLN
40.0000 mg | Freq: Every day | INTRAMUSCULAR | Status: DC
  Administered 2019-07-31: 09:00:00 40 mg via INTRAVENOUS
  Filled 2019-07-30: qty 4

## 2019-07-30 MED ORDER — SACUBITRIL-VALSARTAN 49-51 MG PO TABS
1.0000 | ORAL_TABLET | Freq: Two times a day (BID) | ORAL | Status: DC
  Administered 2019-07-30 – 2019-07-31 (×3): 1 via ORAL
  Filled 2019-07-30 (×4): qty 1

## 2019-07-30 MED ORDER — PERFLUTREN LIPID MICROSPHERE
1.0000 mL | INTRAVENOUS | Status: AC | PRN
  Administered 2019-07-30: 09:00:00 3 mL via INTRAVENOUS
  Filled 2019-07-30: qty 10

## 2019-07-30 NOTE — Discharge Instructions (Addendum)
Medication Changes: - STOP Losartan and START Entresto 49-51mg  twice daily. - START Spironolactone 12.5mg  dialy. - START Imdur 30mg  daily.  - DECREASE Lasix to 20mg  daily.  Continue all other home medications as directed elsewhere on discharge paperwork.  Information on my medicine - XARELTO (Rivaroxaban)  Why was Xarelto prescribed for you? Xarelto was prescribed for you to reduce the risk of a blood clot forming that can cause a stroke if you have a medical condition called atrial fibrillation (a type of irregular heartbeat).  What do you need to know about xarelto ? Take your Xarelto ONCE DAILY at the same time every day with your evening meal. If you have difficulty swallowing the tablet whole, you may crush it and mix in applesauce just prior to taking your dose.  Take Xarelto exactly as prescribed by your doctor and DO NOT stop taking Xarelto without talking to the doctor who prescribed the medication.  Stopping without other stroke prevention medication to take the place of Xarelto may increase your risk of developing a clot that causes a stroke.  Refill your prescription before you run out.  After discharge, you should have regular check-up appointments with your healthcare provider that is prescribing your Xarelto.  In the future your dose may need to be changed if your kidney function or weight changes by a significant amount.  What do you do if you miss a dose? If you are taking Xarelto ONCE DAILY and you miss a dose, take it as soon as you remember on the same day then continue your regularly scheduled once daily regimen the next day. Do not take two doses of Xarelto at the same time or on the same day.   Important Safety Information A possible side effect of Xarelto is bleeding. You should call your healthcare provider right away if you experience any of the following: ? Bleeding from an injury or your nose that does not stop. ? Unusual colored urine (red or dark  brown) or unusual colored stools (red or black). ? Unusual bruising for unknown reasons. ? A serious fall or if you hit your head (even if there is no bleeding).  Some medicines may interact with Xarelto and might increase your risk of bleeding while on Xarelto. To help avoid this, consult your healthcare provider or pharmacist prior to using any new prescription or non-prescription medications, including herbals, vitamins, non-steroidal anti-inflammatory drugs (NSAIDs) and supplements.  This website has more information on Xarelto: [email or web address]  _______________  Heart Failure Education: 1. Weigh yourself EVERY morning after you go to the bathroom but before you eat or drink anything. Write this number down in a weight log/diary. If you gain 3 pounds overnight or 5 pounds in a week, call the office. 2. Take your medicines as prescribed. If you have concerns about your medications, please call us before you stop taking them.  3. Eat low salt foods--Limit salt (sodium) to 2000 mg per day. This will help prevent your body from holding onto fluid. Read food labels as many processed foods have a lot of sodium, especially canned goods and prepackaged meats. If you would like some assistance choosing low sodium foods, we would be happy to set you up with a nutritionist. 4. Stay as active as you can everyday. Staying active will give you more energy and make your muscles stronger. Start with 5 minutes at a time and work your way up to 30 minutes a day. Break up your activities--do some in the morning and  some in the afternoon. Start with 3 days per week and work your way up to 5 days as you can.  If you have chest pain, feel short of breath, dizzy, or lightheaded, STOP. If you don't feel better after a short rest, call 911. If you do feel better, call the office to let us know you have symptoms with exercise. 5. Limit all fluids for the day to less than 2 liters. Fluid includes all drinks, coffee,  juice, ice chips, soup, jello, and all other liquids.

## 2019-07-30 NOTE — Progress Notes (Signed)
Progress Note  Patient Name: Marvin Chandler Date of Encounter: 07/30/2019  Primary Cardiologist: Lauree Chandler, MD   Subjective   69 yo with hx of non-obs CAD, NICM w/ EF 45%, OA, S-CHF, A. 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  Had pulmonary edema on CXR and CT scan ( largely right sided. )  Has diuresed 1.8 liters so far this admission   Inpatient Medications    Scheduled Meds: . fluticasone  2 spray Each Nare Daily  . furosemide  40 mg Intravenous BID  . isosorbide mononitrate  30 mg Oral Daily  . loratadine  10 mg Oral Daily  . mometasone-formoterol  2 puff Inhalation BID  . montelukast  10 mg Oral QHS  . potassium chloride  20 mEq Oral Daily  . pravastatin  80 mg Oral QPM  . rivaroxaban  20 mg Oral Q supper  . sodium chloride flush  3 mL Intravenous Once  . sodium chloride flush  3 mL Intravenous Q12H  . umeclidinium bromide  1 puff Inhalation Daily   Continuous Infusions: . sodium chloride     PRN Meds: sodium chloride, acetaminophen, albuterol, ALPRAZolam, nitroGLYCERIN, ondansetron (ZOFRAN) IV, perflutren lipid microspheres (DEFINITY) IV suspension, sodium chloride flush, zolpidem   Vital Signs    Vitals:   07/29/19 1900 07/29/19 2129 07/30/19 0617 07/30/19 0816  BP: (!) 127/92 94/74 128/78   Pulse:  84 80 80  Resp: (!) 30 16 16 18   Temp:  98.3 F (36.8 C) 98.2 F (36.8 C)   TempSrc:  Oral Oral   SpO2:  100% 97% 96%  Weight:      Height:        Intake/Output Summary (Last 24 hours) at 07/30/2019 0930 Last data filed at 07/30/2019 0745 Gross per 24 hour  Intake 720 ml  Output 2525 ml  Net -1805 ml   Last 3 Weights 07/29/2019 06/14/2019 11/10/2017  Weight (lbs) 215 lb 222 lb 1.9 oz 227 lb 14.4 oz  Weight (kg) 97.523 kg 100.753 kg 103.375 kg      Telemetry    Atrial flutter  at 80   - Personally Reviewed  ECG     - Personally Reviewed  Physical Exam    GEN: No acute distress.   Neck: No JVD Cardiac: RRR, no murmurs, rubs, or gallops.  Respiratory: still has a few rhonchi. GI: Soft, nontender, non-distended  MS: No edema; No deformity. Neuro:  Nonfocal  Psych: Normal affect   Labs    High Sensitivity Troponin:   Recent Labs  Lab 07/29/19 1211 07/29/19 1350  TROPONINIHS 52* 58*      Chemistry Recent Labs  Lab 07/29/19 1211 07/29/19 2049 07/30/19 0142  NA 141  --  140  K 4.3  --  3.8  CL 106  --  105  CO2 26  --  26  GLUCOSE 129*  --  147*  BUN 9  --  11  CREATININE 1.29*  --  1.27*  CALCIUM 9.3  --  9.0  PROT  --  6.8  --   ALBUMIN  --  3.5  --   AST  --  21  --   ALT  --  23  --   ALKPHOS  --  82  --   BILITOT  --  1.7*  --   GFRNONAA 56*  --  57*  GFRAA >60  --  >60  ANIONGAP 9  --  9     Hematology Recent Labs  Lab 07/29/19 1211  WBC 14.2*  RBC 4.87  HGB 14.1  HCT 44.5  MCV 91.4  MCH 29.0  MCHC 31.7  RDW 14.7  PLT 294    BNP Recent Labs  Lab 07/29/19 1513  BNP 362.2*     DDimer No results for input(s): DDIMER in the last 168 hours.   Radiology    DG Chest 2 View  Result Date: 07/29/2019 CLINICAL DATA:  Chest pain, shortness of breath. Additional history provided: Shortness of breath, patient reports unable to walk a short distance with shortness of breath, treated for "mild heart attack" approximately 1 year ago. EXAM: CHEST - 2 VIEW COMPARISON:  Chest radiograph 05/11/2019 FINDINGS: Heart size within normal limits. Aortic atherosclerosis. Ill-defined airspace opacities within the right mid to lower lung field. These opacities have progressed within the perihilar right lung as compared to chest radiograph 05/11/2019. The left lung is clear. No evidence of pleural effusion or pneumothorax. No acute bony abnormality IMPRESSION: Ill-defined airspace opacities within the right mid to lower lung field. These  opacities have progressed within the perihilar right lung as compared to 05/11/2019. Findings are nonspecific but may reflect pneumonia or asymmetric edema. Close radiographic follow-up is recommended. Alternatively, CT may be helpful for further characterization. Electronically Signed   By: Kellie Simmering DO   On: 07/29/2019 12:39   CT Chest W Contrast  Result Date: 07/29/2019 CLINICAL DATA:  Intermittent chest pain and shortness, lower of breath extremity swelling for several days. EXAM: CT CHEST WITH CONTRAST TECHNIQUE: Multidetector CT imaging of the chest was performed during intravenous contrast administration. CONTRAST:  75 mL OMNIPAQUE IOHEXOL 300 MG/ML  SOLN COMPARISON:  CT chest 11/09/2017. PA and lateral chest today and 11/08/2017. FINDINGS: Cardiovascular: Heart size is upper normal. Left atrial enlargement is seen. There is calcific aortic and coronary atherosclerosis. No pericardial effusion. Mediastinum/Nodes: No enlarged mediastinal, hilar, or axillary lymph nodes. Thyroid gland, trachea, and esophagus demonstrate no significant findings. Lungs/Pleura: The patient has small bilateral pleural effusions, greater on the right. Fairly extensive emphysematous disease is present. Ground-glass attenuation is seen throughout the right upper and lower lobes. Patchy, milder ground-glass attenuation is seen in the right middle lobe and left upper and lower lobes. Upper Abdomen: Cysts in the upper pole of the right kidney are noted. Musculoskeletal: No acute or focal bony abnormality. IMPRESSION: Right greater than left ground-glass attenuation is nonspecific but could reflect asymmetric edema, pneumonia or hypersensitivity pneumonitis. Small pleural effusions, larger on the right. Aortic Atherosclerosis (ICD10-I70.0) and Emphysema (ICD10-J43.9). Calcific coronary artery disease also noted. Electronically Signed   By: Inge Rise M.D.   On: 07/29/2019 14:37    Cardiac Studies     Patient Profile      69 y.o. male  With non-obs CAD, NICM w/ EF 45%, OA, S-CHF, A. 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  Acute on chronic combined CHF:   Prelim views of the echo suggest an EF of 25-30%. Which is lower than his previous echo . Will start Entresto 49-51 mb PO BID instead of the Losartan he was on at home. Will reduced lasix to  40 mg daily Will try to add spironolactone tomorrow if BP allows.   2.   CAD:   No angina  3.   Atrial flutter:  Cont xarelto  4.  Hyperlipidemia ;cont pravachol   For questions or updates, please contact [place] Please consult [email or web address] for contact info under        Signed, Mertie Moores, MD  07/30/2019, 9:30 AM

## 2019-07-30 NOTE — Progress Notes (Signed)
  Echocardiogram 2D Echocardiogram has been performed.  Marvin Chandler 07/30/2019, 9:25 AM

## 2019-07-30 NOTE — Progress Notes (Signed)
Dr. Cathie Olden notified about patient "feeling funny" and having cough and slight chest pain. EKG performed and per Dr. Cathie Olden will continue to monitor.

## 2019-07-31 ENCOUNTER — Other Ambulatory Visit: Payer: Self-pay | Admitting: Student

## 2019-07-31 DIAGNOSIS — I248 Other forms of acute ischemic heart disease: Secondary | ICD-10-CM

## 2019-07-31 DIAGNOSIS — I5023 Acute on chronic systolic (congestive) heart failure: Secondary | ICD-10-CM

## 2019-07-31 DIAGNOSIS — Z79899 Other long term (current) drug therapy: Secondary | ICD-10-CM

## 2019-07-31 DIAGNOSIS — I2510 Atherosclerotic heart disease of native coronary artery without angina pectoris: Secondary | ICD-10-CM

## 2019-07-31 DIAGNOSIS — E785 Hyperlipidemia, unspecified: Secondary | ICD-10-CM

## 2019-07-31 LAB — BASIC METABOLIC PANEL
Anion gap: 10 (ref 5–15)
BUN: 13 mg/dL (ref 8–23)
CO2: 26 mmol/L (ref 22–32)
Calcium: 9.2 mg/dL (ref 8.9–10.3)
Chloride: 102 mmol/L (ref 98–111)
Creatinine, Ser: 1.16 mg/dL (ref 0.61–1.24)
GFR calc Af Amer: >60 mL/min (ref >60)
GFR calc non Af Amer: >60 mL/min (ref >60)
Glucose, Bld: 132 mg/dL — ABNORMAL HIGH (ref 70–99)
Potassium: 4.2 mmol/L (ref 3.5–5.1)
Sodium: 138 mmol/L (ref 135–145)

## 2019-07-31 LAB — MAGNESIUM: Magnesium: 2 mg/dL (ref 1.7–2.4)

## 2019-07-31 MED ORDER — SPIRONOLACTONE 12.5 MG HALF TABLET
12.5000 mg | ORAL_TABLET | Freq: Every day | ORAL | Status: DC
  Administered 2019-07-31: 11:00:00 12.5 mg via ORAL
  Filled 2019-07-31: qty 1

## 2019-07-31 MED ORDER — SACUBITRIL-VALSARTAN 49-51 MG PO TABS: 1.0000 | ORAL_TABLET | Freq: Two times a day (BID) | ORAL | 2 refills | Status: DC

## 2019-07-31 MED ORDER — FUROSEMIDE 20 MG PO TABS: 20.0000 mg | ORAL_TABLET | Freq: Every day | ORAL | 2 refills | Status: DC

## 2019-07-31 MED ORDER — FUROSEMIDE 20 MG PO TABS: 20.0000 mg | ORAL_TABLET | Freq: Every day | ORAL | Status: DC

## 2019-07-31 MED ORDER — ISOSORBIDE MONONITRATE ER 30 MG PO TB24: 30.0000 mg | ORAL_TABLET | Freq: Every day | ORAL | 2 refills | Status: AC

## 2019-07-31 MED ORDER — SPIRONOLACTONE 25 MG PO TABS: 12.5000 mg | ORAL_TABLET | Freq: Every day | ORAL | 2 refills | Status: AC

## 2019-07-31 MED FILL — ENTRESTO 49 MG-51 MG TABLET: 49-51 | 30 days supply | Qty: 60 | Fill #0

## 2019-07-31 MED FILL — SPIRONOLACTONE 25 MG TABLET: 25 | 30 days supply | Qty: 15 | Fill #0

## 2019-07-31 MED FILL — ISOSORBIDE MN ER 30 MG TAB: 30 | 30 days supply | Qty: 30 | Fill #0

## 2019-07-31 MED FILL — FUROSEMIDE 20 MG TAB: 20 | 30 days supply | Qty: 30 | Fill #0

## 2019-07-31 NOTE — Discharge Summary (Addendum)
Discharge Summary    Patient ID: Marvin Chandler MRN: [id number]; DOB: November 08, 1950  Admit date: 07/29/2019 Discharge date: 07/31/2019  Primary Care Provider: [facility], [facility]  Primary Cardiologist: Lauree Chandler, MD  Primary Electrophysiologist:  None   Discharge Diagnoses    Principal Problem:   Acute on chronic systolic CHF (congestive heart failure) [facility]) Active Problems:   Chest pain with moderate risk for cardiac etiology   Persistent atrial fibrillation [facility])   Non-Obstructive CAD   Demand ischemia [facility])   Dyslipidemia    Diagnostic Studies/Procedures    Echocardiogram 3/29/202021: Impressions: 1. Left ventricular ejection fraction, by estimation, is 25 to 30%. The  left ventricle has severely decreased function. The left ventricle  demonstrates global hypokinesis. Left ventricular diastolic function could  not be evaluated. There is severe  akinesis of the left ventricular, entire inferior wall and inferoseptal  wall.  2. Right ventricular systolic function was not well visualized. The right  ventricular size is not well visualized.  3. Left atrial size was mildly dilated.  4. The mitral valve is grossly normal. Trivial mitral valve  regurgitation.  5. The aortic valve is tricuspid. Aortic valve regurgitation is trivial.  Mild aortic valve sclerosis is present, with no evidence of aortic valve  stenosis.  6. The inferior vena cava is normal in size with <50% respiratory  variability, suggesting right atrial pressure of 8 mmHg.   Comparison(s): Changes from prior study are noted. 10/31/2017: LVEF 35-40%,  global hypokinesis, worse inferiorly.  _____________   History of Present Illness     Marvin Chandler is a 69 y.o. male with a history of non-obstructive CAD on cardiac cath in [id number], chronic systolic CHF/ nonischemic cardiomyopathy, atrial fibrillation on xarelto, AV block so not on AV nodal blocking agents, and recent COVID infection in  05/2019 who is followed by Dr. Angelena Form. He was last seen by Dr. Angelena Form on 06/14/2019 at which time he was in atrial fibrillation, with rates in the 90's, but was doing well from a cardiac standpoint.   Patient presented to the ED on 07/29/2019 for shortness of breath and chest pain. He reported feeling well until about 1 week ago when he started noticing increasing dyspnea on exertion. Symptoms progressed and he developed orthopnea and PND. For the last few nights prior to presentation, patient had to sit up at night because he could not get any rest if he tried to lay down. He also report increasing lower extremity edema which is something he has not had before. He denied any weight gain and states he has actually lost about 7 lbs in the last month or so. He did admit to dietary indiscretion and reported not watching his salt intake very closely. He also reported intermittent episode of chest pain for the last 3 days prior to presentation. Described pain as a tightness located at the lower edge of the sternum. Area was also a little tender to palpation. Non-exertional. He reports taking a total of 7 Nitro for the pain. Pain would go away with each Nitro. He developed an episode of chest pain that was slightly worse than the usual episodes (6-7/10 on the pain scale) on day of presentation. His shortness of breath was also bothering him more so he decided to come to the ED for further evaluation.  In the ED, he received a dose of IV Lasix. He took a Nitro before coming in. At time of initial cardiac evaluation, he reported a small amount of chest tightness (  2/10 on pain scale). Shortness of breath had improved but was not quite at baseline. Decision was made to admit him for further evaluation of CHF.  Hospital Course     Consultants: None   Acute on Chronic Systolic CHF/Non-Ischemic Cardiomyopathy  Patient presented with worsening dyspnea on exertion, orthopnea, PND, and chest tightness as stated above.  BNP elevated at 362. Chest x-ray showed ill-defined airspace opacities within the right mid to lower lung field that have progressed since imaging in 05/2019. Findings are non-specific but could reflect pneumonia or asymmetric edema. CT showed right > left ground-glass attenuation (again non-specific) and small pleural effusion(right > left). Echo showed LVEF of 25-30% with global hypokinesis and severe akinesis of the entire inferior and inferoseptal wall. EF 35-40% on Echo in 10/2017. Wall motion abnormalities in the inferior wall have been noted before. Patient was started on IV Lasix with good response and improvement in symptoms. Home Losartan was stopped and Entresto was initiated. Also started on Imdur and then Spironolactone on day of discharge. Net negative 3.8 L this admission. Discharge weight 213.6 lbs (222 lbs at last office visit on 06/14/2019).  - Will decrease home Lasix to 20mg  daily on discharge given addition of Entresto.  - Continue Entresto 49-51mg  twice daily (started this admission). - Continue Spironolactone 12.5mg  daily (started this admission). - Continue Imdur 30mg  daily (started this admission). - No beta-blocker due to history of AV block.  - Discussed importance of daily weights and fluid/sodium restrictions.  - Will recheck renal function and potassium in 1 week via CMET.  Atrial Fibrillation / Non-Sustained VT Patient was noted to be in atrial fibrillation with rates in the 90's at last visit with Dr. Angelena Form on 06/14/2019. He has remained in atrial fibrillation throughout admission with rates mostly in the 80's to low 100's. She also had a few shorts runs of non-sustained VT. Electrolytes normal. Asymptomatic with this.  - No beta-blocker due to history of AV block.  - Continue chronic anticoagulation with Xarelto 20mg  daily. - Consider DCCV after CHF as been adequately treated.   Chest Pain with Non-Obstructive CAD / Demand Ischemia Episodes of chest tightness seemed to  improve with diuresis. High-sensitivity troponin minimally elevated but flat at 52 >> 58. Not consistent with ACS. Reassuring that patient had minimal disease (only 30% of LAD) on cardiac cath in 10/2017. Felt to be demand ischemia in setting of acute on chronic CHF and atrial fibrillation.  - Continue statin. No aspirin due to need for Xarelto.   Dyslipidemia Lipid panel from 06/14/2019: Total Cholesterol 75, Triglycerides 52, HDL 30, LDL 32. - Continue Pravastatin 80mg  daily.  Elevated Total Bili Total Bili elevated at 1.7 this admission. Direct Bili 0.3 and Indirect Bili 1.4. WBC 14.2 on admission but no signs of infection. Patient did have mild abdominal tenderness on exam but denies any abdominal pain or GI symptoms.  - Recommend following up with PCP.  Patient seen and examined by Dr. Acie Fredrickson today and determined to be stable for discharge. Outpatient follow-up arranged. Medications as below.    Did the patient have an acute coronary syndrome (MI, NSTEMI, STEMI, etc) this admission?:  No.   The elevated Troponin was due to the acute medical illness (demand ischemia).  _____________  Discharge Vitals Blood pressure 128/79, pulse 76, temperature 98.2 F (36.8 C), temperature source Oral, resp. rate 16, height 5\' 11"  (1.803 m), weight 96.9 kg, SpO2 97 %.  Filed Weights   07/29/19 1205 07/31/19 0955  Weight: 97.5  kg 96.9 kg    Labs & Radiologic Studies    CBC Recent Labs    07/29/19 1211  WBC 14.2*  HGB 14.1  HCT 44.5  MCV 91.4  PLT [id number]   Basic Metabolic Panel Recent Labs    07/30/19 0142 07/31/19 0534  NA 140 138  K 3.8 4.2  CL 105 102  CO2 26 26  GLUCOSE 147* 132*  BUN 11 13  CREATININE 1.27* 1.16  CALCIUM 9.0 9.2  MG  --  2.0   Liver Function Tests Recent Labs    07/29/19 2049  AST 21  ALT 23  ALKPHOS 82  BILITOT 1.7*  PROT 6.8  ALBUMIN 3.5   No results for input(s): LIPASE, AMYLASE in the last 72 hours. High Sensitivity Troponin:   Recent Labs   Lab 07/29/19 1211 07/29/19 1350  TROPONINIHS 52* 58*    BNP Invalid input(s): POCBNP D-Dimer No results for input(s): DDIMER in the last 72 hours. Hemoglobin A1C No results for input(s): HGBA1C in the last 72 hours. Fasting Lipid Panel No results for input(s): CHOL, HDL, LDLCALC, TRIG, CHOLHDL, LDLDIRECT in the last 72 hours. Thyroid Function Tests No results for input(s): TSH, T4TOTAL, T3FREE, THYROIDAB in the last 72 hours.  Invalid input(s): FREET3 _____________  DG Chest 2 View  Result Date: 07/29/2019 CLINICAL DATA:  Chest pain, shortness of breath. Additional history provided: Shortness of breath, patient reports unable to walk a short distance with shortness of breath, treated for "mild heart attack" approximately 1 year ago. EXAM: CHEST - 2 VIEW COMPARISON:  Chest radiograph 05/11/2019 FINDINGS: Heart size within normal limits. Aortic atherosclerosis. Ill-defined airspace opacities within the right mid to lower lung field. These opacities have progressed within the perihilar right lung as compared to chest radiograph 05/11/2019. The left lung is clear. No evidence of pleural effusion or pneumothorax. No acute bony abnormality IMPRESSION: Ill-defined airspace opacities within the right mid to lower lung field. These opacities have progressed within the perihilar right lung as compared to 05/11/2019. Findings are nonspecific but may reflect pneumonia or asymmetric edema. Close radiographic follow-up is recommended. Alternatively, CT may be helpful for further characterization. Electronically Signed   By: Kellie Simmering DO   On: 07/29/2019 12:39   CT Chest W Contrast  Result Date: 07/29/2019 CLINICAL DATA:  Intermittent chest pain and shortness, lower of breath extremity swelling for several days. EXAM: CT CHEST WITH CONTRAST TECHNIQUE: Multidetector CT imaging of the chest was performed during intravenous contrast administration. CONTRAST:  75 mL OMNIPAQUE IOHEXOL 300 MG/ML  SOLN  COMPARISON:  CT chest 11/09/2017. PA and lateral chest today and 11/08/2017. FINDINGS: Cardiovascular: Heart size is upper normal. Left atrial enlargement is seen. There is calcific aortic and coronary atherosclerosis. No pericardial effusion. Mediastinum/Nodes: No enlarged mediastinal, hilar, or axillary lymph nodes. Thyroid gland, trachea, and esophagus demonstrate no significant findings. Lungs/Pleura: The patient has small bilateral pleural effusions, greater on the right. Fairly extensive emphysematous disease is present. Ground-glass attenuation is seen throughout the right upper and lower lobes. Patchy, milder ground-glass attenuation is seen in the right middle lobe and left upper and lower lobes. Upper Abdomen: Cysts in the upper pole of the right kidney are noted. Musculoskeletal: No acute or focal bony abnormality. IMPRESSION: Right greater than left ground-glass attenuation is nonspecific but could reflect asymmetric edema, pneumonia or hypersensitivity pneumonitis. Small pleural effusions, larger on the right. Aortic Atherosclerosis (ICD10-I70.0) and Emphysema (ICD10-J43.9). Calcific coronary artery disease also noted. Electronically Signed   By: Marcello Moores  Dalessio M.D.   On: 07/29/2019 14:37   ECHOCARDIOGRAM COMPLETE  Result Date: 07/30/2019    ECHOCARDIOGRAM REPORT   Patient Name:   JAYANTH TYBURSKI Date of Exam: 07/30/2019 Medical Rec #:  [id number]        Height:       71.0 in Accession #:    [id number]       Weight:       215.0 lb Date of Birth:  25-Dec-1950         BSA:          2.174 m Patient Age:    10 years         BP:           128/78 mmHg Patient Gender: M                HR:           80 bpm. Exam Location:  Inpatient Procedure: 2D Echo Indications:    CHF- Acute Systolic [id number]  History:        Patient has prior history of Echocardiogram examinations, most                 recent 10/30/2017. Non-ischemic Cardiomyopathy and CHF, CAD;                 Arrythmias:Atrial Fibrillation.  Sonographer:     Mikki Santee RDCS (AE) Referring Phys: [id number] [facility] G BARRETT  Sonographer Comments: Technically difficult study due to poor echo windows. IMPRESSIONS  1. Left ventricular ejection fraction, by estimation, is 25 to 30%. The left ventricle has severely decreased function. The left ventricle demonstrates global hypokinesis. Left ventricular diastolic function could not be evaluated. There is severe akinesis of the left ventricular, entire inferior wall and inferoseptal wall.  2. Right ventricular systolic function was not well visualized. The right ventricular size is not well visualized.  3. Left atrial size was mildly dilated.  4. The mitral valve is grossly normal. Trivial mitral valve regurgitation.  5. The aortic valve is tricuspid. Aortic valve regurgitation is trivial. Mild aortic valve sclerosis is present, with no evidence of aortic valve stenosis.  6. The inferior vena cava is normal in size with <50% respiratory variability, suggesting right atrial pressure of 8 mmHg. Comparison(s): Changes from prior study are noted. 10/31/2017: LVEF 35-40%, global hypokinesis, worse inferiorly. FINDINGS  Left Ventricle: Left ventricular ejection fraction, by estimation, is 25 to 30%. The left ventricle has severely decreased function. The left ventricle demonstrates global hypokinesis. Severe akinesis of the left ventricular, entire inferior wall and inferoseptal wall. Definity contrast agent was given IV to delineate the left ventricular endocardial borders. The left ventricular internal cavity size was normal in size. There is no left ventricular hypertrophy. Left ventricular diastolic function could not be evaluated due to atrial fibrillation. Left ventricular diastolic function could not be evaluated. Right Ventricle: The right ventricular size is not well visualized. Right vetricular wall thickness was not assessed. Right ventricular systolic function was not well visualized. Left Atrium: Left atrial size was  mildly dilated. Right Atrium: Right atrial size was normal in size. Pericardium: There is no evidence of pericardial effusion. Mitral Valve: The mitral valve is grossly normal. Trivial mitral valve regurgitation. Tricuspid Valve: The tricuspid valve is grossly normal. Tricuspid valve regurgitation is trivial. Aortic Valve: The aortic valve is tricuspid. Aortic valve regurgitation is trivial. Mild aortic valve sclerosis is present, with no evidence of aortic valve stenosis. Pulmonic Valve: The pulmonic valve was  grossly normal. Pulmonic valve regurgitation is trivial. Aorta: The aortic root and ascending aorta are structurally normal, with no evidence of dilitation. Venous: The inferior vena cava is normal in size with less than 50% respiratory variability, suggesting right atrial pressure of 8 mmHg. IAS/Shunts: The interatrial septum was not well visualized.  LEFT VENTRICLE PLAX 2D LVIDd:         5.90 cm      Diastology LVIDs:         5.20 cm      LV e' lateral:   5.63 cm/s LV PW:         1.00 cm      LV E/e' lateral: 12.0 LV IVS:        0.90 cm      LV e' medial:    7.05 cm/s LVOT diam:     2.60 cm      LV E/e' medial:  9.5 LV SV:         54 LV SV Index:   25 LVOT Area:     5.31 cm  LV Volumes (MOD) LV vol d, MOD A2C: 121.0 ml LV vol d, MOD A4C: 121.0 ml LV vol s, MOD A2C: 96.4 ml LV vol s, MOD A4C: 90.7 ml LV SV MOD A2C:     24.6 ml LV SV MOD A4C:     121.0 ml LV SV MOD BP:      24.8 ml RIGHT VENTRICLE RV S prime:     8.92 cm/s TAPSE (M-mode): 2.2 cm LEFT ATRIUM              Index       RIGHT ATRIUM           Index LA diam:        4.50 cm  2.07 cm/m  RA Area:     21.60 cm LA Vol (A2C):   104.0 ml 47.83 ml/m RA Volume:   68.30 ml  31.41 ml/m LA Vol (A4C):   66.8 ml  30.72 ml/m LA Biplane Vol: 83.4 ml  38.36 ml/m  AORTIC VALVE LVOT Vmax:   57.30 cm/s LVOT Vmean:  40.600 cm/s LVOT VTI:    0.102 m  AORTA Ao Root diam: 3.60 cm MITRAL VALVE MV Area (PHT): 4.71 cm    SHUNTS MV Decel Time: 161 msec    Systemic  VTI:  0.10 m MV E velocity: 67.30 cm/s  Systemic Diam: 2.60 cm MV A velocity: 37.70 cm/s MV E/A ratio:  1.79 Lyman Bishop MD Electronically signed by Lyman Bishop MD Signature Date/Time: 07/30/2019/11:47:28 AM    Final    Disposition   Patient is being discharged home today in good condition.  Follow-up Plans & Appointments    Follow-up Information    Imogene Burn, PA-C Follow up.   Specialty: Cardiology Why: Follow-up visit scheduled for 08/21/2019 at 12:15pm with Ermalinda Barrios, one of Dr. Camillia Herter PAs. Contact information: [address] [place] [address] [phone]        [facility] [employer]. Call.   Specialty: Cardiology Why: Please come by our [facility] on 08/06/2019 for a lab visit so that we can recheck your kidney function. You can come by any time from 7:30am to 4:00pm but the lab is closed from 12:00pm to 1:00pm for lunch. Contact information: [address] [facility] Follow up.   Why:  Please call your primary care provider's office as soon as possible to schedule a follow-up visit within the next 1-2 weeks.  Contact information: [place] [address] [phone]          Discharge Instructions    Diet - low sodium heart healthy   Complete by: As directed    Increase activity slowly   Complete by: As directed       Discharge Medications   Allergies as of 07/31/2019   No Known Allergies     Medication List    STOP taking these medications   guaiFENesin-codeine 100-10 MG/5ML syrup   ibuprofen 200 MG tablet Commonly known as: ADVIL   losartan 50 MG tablet Commonly known as: COZAAR   omeprazole 40 MG capsule Commonly known as: PRILOSEC   sildenafil 50 MG tablet Commonly known as: Viagra     TAKE these medications   albuterol (2.5 MG/3ML) 0.083% nebulizer solution Commonly known  as: PROVENTIL Take 3 mLs (2.5 mg total) by nebulization every 6 (six) hours as needed for wheezing or shortness of breath.   albuterol 108 (90 Base) MCG/ACT inhaler Commonly known as: Ventolin HFA Inhale 2 puffs into the lungs every 4 (four) hours as needed.   budesonide-formoterol 160-4.5 MCG/ACT inhaler Commonly known as: SYMBICORT Inhale 2 puffs into the lungs daily.   cetirizine 10 MG tablet Commonly known as: ZYRTEC Take 10 mg by mouth daily.   EPINEPHrine 0.3 mg/0.3 mL Soaj injection Commonly known as: EPI-PEN Inject 0.3 mLs (0.3 mg total) into the muscle once.   fluticasone 50 MCG/ACT nasal spray Commonly known as: FLONASE Place 2 sprays into both nostrils daily.   furosemide 20 MG tablet Commonly known as: LASIX Take 1 tablet (20 mg total) by mouth daily. Start taking on: August 01, 2019 What changed:   medication strength  how much to take  when to take this   isosorbide mononitrate 30 MG 24 hr tablet Commonly known as: IMDUR Take 1 tablet (30 mg total) by mouth daily. Start taking on: August 01, 2019   montelukast 10 MG tablet Commonly known as: SINGULAIR TAKE 1 TABLET BY MOUTH AT BEDTIME   Nebulizers Misc by Does not apply route 4 (four) times daily. Use with nebulizer four times a day as needed for wheezing or sob   nitroGLYCERIN 0.4 MG SL tablet Commonly known as: NITROSTAT Place 1 tablet (0.4 mg total) under the tongue every 5 (five) minutes as needed. What changed: reasons to take this   pravastatin 80 MG tablet Commonly known as: PRAVACHOL Take 1 tablet (80 mg total) by mouth every evening.   rivaroxaban 20 MG Tabs tablet Commonly known as: Xarelto Take 1 tablet (20 mg total) by mouth daily with supper.   sacubitril-valsartan 49-51 MG Commonly known as: ENTRESTO Take 1 tablet by mouth 2 (two) times daily.   Spiriva HandiHaler 18 MCG inhalation capsule Generic drug: tiotropium INHALE THE CONTENTS OF 1 CAPSULE VIA HANDIHALER BY MOUTH  DAILY What changed: See the new instructions.   spironolactone 25 MG tablet Commonly known as: ALDACTONE Take 0.5 tablets (12.5 mg total) by mouth daily. Start taking on: August 01, 2019          Outstanding Labs/Studies   Repeat CMET in 1 week.   Duration of Discharge Encounter   Greater than 30 minutes including physician time.  Signed, Darreld Mclean, PA-C 07/31/2019, 2:41 PM  Attending Note:   The patient was seen and examined.  Agree with  assessment and plan as noted above.  Changes made to the above note as needed.  Patient seen and independently examined with Sande Rives, PA .   We discussed all aspects of the encounter. I agree with the assessment and plan as stated above.  1.   A/C combined CHF:   Gradually titrating meds up. Adding spiro 12.5 mg a day  Net diuresis is 3.8 liters so far this admission  Will ambulate in the hallway  DC on lasix 20 mg a day  Spironolactone 12.5 a day  entresto 49-51 BID Imdur 30 mg a day   We have discussed low salt diet.  2.  PAF:  Cont xarelto.   We can discuss cardioversion after his CHF is adequately treated.   Will have him follow up with APP in 3 weeks  3.  Hyperlipidemia :  Cont Prava 80.      I have spent a total of 40 minutes with patient reviewing hospital  notes , telemetry, EKGs, labs and examining patient as well as establishing an assessment and plan that was discussed with the patient. > 50% of time was spent in direct patient care.    Marvin Headings, Brooke Bonito., MD, [facility] 07/31/2019, 10:49 AM [address]  [address] Office [phone] Pager [phone]       Revision History

## 2019-07-31 NOTE — Progress Notes (Signed)
RN gave pt discharge instructions and went over medications he stated understanding. IV has been removed, pt medications are going to brought up by [facility] pharmacy. Pt is getting himself dressed.

## 2019-07-31 NOTE — Progress Notes (Addendum)
Progress Note  Patient Name: Marvin Chandler Date of Encounter: 07/31/2019  Primary Cardiologist: Lauree Chandler, MD   Subjective   Patient had one brief episode of chest tightness yesterday afternoon but denies any since then. Breathing has improved but he still notes some shortness of breath with activity. He states he does not feel quite back to baseline but thinks he is closely. No palpitations, lightheadedness, or dizziness.   Inpatient Medications    Scheduled Meds: . fluticasone  2 spray Each Nare Daily  . furosemide  40 mg Intravenous Daily  . isosorbide mononitrate  30 mg Oral Daily  . loratadine  10 mg Oral Daily  . mometasone-formoterol  2 puff Inhalation BID  . montelukast  10 mg Oral QHS  . potassium chloride  20 mEq Oral Daily  . pravastatin  80 mg Oral QPM  . rivaroxaban  20 mg Oral Q supper  . sacubitril-valsartan  1 tablet Oral BID  . sodium chloride flush  3 mL Intravenous Once  . sodium chloride flush  3 mL Intravenous Q12H  . umeclidinium bromide  1 puff Inhalation Daily   Continuous Infusions: . sodium chloride     PRN Meds: sodium chloride, acetaminophen, albuterol, ALPRAZolam, nitroGLYCERIN, ondansetron (ZOFRAN) IV, sodium chloride flush, zolpidem   Vital Signs    Vitals:   07/30/19 1822 07/30/19 2343 07/31/19 0529 07/31/19 0749  BP: 119/78 116/79 128/79   Pulse: 69 96 79 76  Resp: 18 17 16 16   Temp: 98.1 F (36.7 C) 98.3 F (36.8 C) 98.2 F (36.8 C)   TempSrc: Oral Oral Oral   SpO2: 98% 97% 99% 97%  Weight:      Height:        Intake/Output Summary (Last 24 hours) at 07/31/2019 0908 Last data filed at 07/31/2019 0537 Gross per 24 hour  Intake 480 ml  Output 2500 ml  Net -2020 ml   Last 3 Weights 07/29/2019 06/14/2019 11/10/2017  Weight (lbs) 215 lb 222 lb 1.9 oz 227 lb 14.4 oz  Weight (kg) 97.523 kg 100.753 kg 103.375 kg      Telemetry    Atrial fibrillation with baseline rates in the 80's to low 100's but as high as 120's  to 140's at times. Also has had a few short runs of non-sustained VT (longest being 9 beats). - Personally Reviewed  ECG    No new EKG tracing today. - Personally Reviewed  Physical Exam   GEN: No acute distress.   Neck: No JVD Cardiac: Irregularly irregular rhythm with normal rate. No murmurs, rubs, or gallops. Radial pulses 2+ and equal bilaterally. Respiratory: No increased work of breathing. Decreased breath sounds in right base.  GI: Soft and non-distended but tender to palpation of upper abdomen. Bowel sounds present. MS: Trace lower extremity edema. No deformity. Skin: Warm and dry. Neuro:  No focal deficits. Psych: Normal affect. Responds appropriately.   Labs    High Sensitivity Troponin:   Recent Labs  Lab 07/29/19 1211 07/29/19 1350  TROPONINIHS 52* 58*      Chemistry Recent Labs  Lab 07/29/19 1211 07/29/19 2049 07/30/19 0142 07/31/19 0534  NA 141  --  140 138  K 4.3  --  3.8 4.2  CL 106  --  105 102  CO2 26  --  26 26  GLUCOSE 129*  --  147* 132*  BUN 9  --  11 13  CREATININE 1.29*  --  1.27* 1.16  CALCIUM 9.3  --  9.0 9.2  PROT  --  6.8  --   --   ALBUMIN  --  3.5  --   --   AST  --  21  --   --   ALT  --  23  --   --   ALKPHOS  --  82  --   --   BILITOT  --  1.7*  --   --   GFRNONAA 56*  --  57* >60  GFRAA >60  --  >60 >60  ANIONGAP 9  --  9 10     Hematology Recent Labs  Lab 07/29/19 1211  WBC 14.2*  RBC 4.87  HGB 14.1  HCT 44.5  MCV 91.4  MCH 29.0  MCHC 31.7  RDW 14.7  PLT 294    BNP Recent Labs  Lab 07/29/19 1513  BNP 362.2*     DDimer No results for input(s): DDIMER in the last 168 hours.   Radiology    DG Chest 2 View  Result Date: 07/29/2019 CLINICAL DATA:  Chest pain, shortness of breath. Additional history provided: Shortness of breath, patient reports unable to walk a short distance with shortness of breath, treated for "mild heart attack" approximately 1 year ago. EXAM: CHEST - 2 VIEW COMPARISON:  Chest  radiograph 05/11/2019 FINDINGS: Heart size within normal limits. Aortic atherosclerosis. Ill-defined airspace opacities within the right mid to lower lung field. These opacities have progressed within the perihilar right lung as compared to chest radiograph 05/11/2019. The left lung is clear. No evidence of pleural effusion or pneumothorax. No acute bony abnormality IMPRESSION: Ill-defined airspace opacities within the right mid to lower lung field. These opacities have progressed within the perihilar right lung as compared to 05/11/2019. Findings are nonspecific but may reflect pneumonia or asymmetric edema. Close radiographic follow-up is recommended. Alternatively, CT may be helpful for further characterization. Electronically Signed   By: Kellie Simmering DO   On: 07/29/2019 12:39   CT Chest W Contrast  Result Date: 07/29/2019 CLINICAL DATA:  Intermittent chest pain and shortness, lower of breath extremity swelling for several days. EXAM: CT CHEST WITH CONTRAST TECHNIQUE: Multidetector CT imaging of the chest was performed during intravenous contrast administration. CONTRAST:  75 mL OMNIPAQUE IOHEXOL 300 MG/ML  SOLN COMPARISON:  CT chest 11/09/2017. PA and lateral chest today and 11/08/2017. FINDINGS: Cardiovascular: Heart size is upper normal. Left atrial enlargement is seen. There is calcific aortic and coronary atherosclerosis. No pericardial effusion. Mediastinum/Nodes: No enlarged mediastinal, hilar, or axillary lymph nodes. Thyroid gland, trachea, and esophagus demonstrate no significant findings. Lungs/Pleura: The patient has small bilateral pleural effusions, greater on the right. Fairly extensive emphysematous disease is present. Ground-glass attenuation is seen throughout the right upper and lower lobes. Patchy, milder ground-glass attenuation is seen in the right middle lobe and left upper and lower lobes. Upper Abdomen: Cysts in the upper pole of the right kidney are noted. Musculoskeletal: No acute  or focal bony abnormality. IMPRESSION: Right greater than left ground-glass attenuation is nonspecific but could reflect asymmetric edema, pneumonia or hypersensitivity pneumonitis. Small pleural effusions, larger on the right. Aortic Atherosclerosis (ICD10-I70.0) and Emphysema (ICD10-J43.9). Calcific coronary artery disease also noted. Electronically Signed   By: Inge Rise M.D.   On: 07/29/2019 14:37   ECHOCARDIOGRAM COMPLETE  Result Date: 07/30/2019    ECHOCARDIOGRAM REPORT   Patient Name:   XAMIR ROHLIK Date of Exam: 07/30/2019 Medical Rec #:  [id number]        Height:  71.0 in Accession #:    [id number]       Weight:       215.0 lb Date of Birth:  04/14/1951         BSA:          2.174 m Patient Age:    64 years         BP:           128/78 mmHg Patient Gender: M                HR:           80 bpm. Exam Location:  Inpatient Procedure: 2D Echo Indications:    CHF- Acute Systolic [id number]  History:        Patient has prior history of Echocardiogram examinations, most                 recent 10/30/2017. Non-ischemic Cardiomyopathy and CHF, CAD;                 Arrythmias:Atrial Fibrillation.  Sonographer:    Mikki Santee RDCS (AE) Referring Phys: 1993 [facility] G BARRETT  Sonographer Comments: Technically difficult study due to poor echo windows. IMPRESSIONS  1. Left ventricular ejection fraction, by estimation, is 25 to 30%. The left ventricle has severely decreased function. The left ventricle demonstrates global hypokinesis. Left ventricular diastolic function could not be evaluated. There is severe akinesis of the left ventricular, entire inferior wall and inferoseptal wall.  2. Right ventricular systolic function was not well visualized. The right ventricular size is not well visualized.  3. Left atrial size was mildly dilated.  4. The mitral valve is grossly normal. Trivial mitral valve regurgitation.  5. The aortic valve is tricuspid. Aortic valve regurgitation is trivial. Mild aortic valve  sclerosis is present, with no evidence of aortic valve stenosis.  6. The inferior vena cava is normal in size with <50% respiratory variability, suggesting right atrial pressure of 8 mmHg. Comparison(s): Changes from prior study are noted. 10/31/2017: LVEF 35-40%, global hypokinesis, worse inferiorly. FINDINGS  Left Ventricle: Left ventricular ejection fraction, by estimation, is 25 to 30%. The left ventricle has severely decreased function. The left ventricle demonstrates global hypokinesis. Severe akinesis of the left ventricular, entire inferior wall and inferoseptal wall. Definity contrast agent was given IV to delineate the left ventricular endocardial borders. The left ventricular internal cavity size was normal in size. There is no left ventricular hypertrophy. Left ventricular diastolic function could not be evaluated due to atrial fibrillation. Left ventricular diastolic function could not be evaluated. Right Ventricle: The right ventricular size is not well visualized. Right vetricular wall thickness was not assessed. Right ventricular systolic function was not well visualized. Left Atrium: Left atrial size was mildly dilated. Right Atrium: Right atrial size was normal in size. Pericardium: There is no evidence of pericardial effusion. Mitral Valve: The mitral valve is grossly normal. Trivial mitral valve regurgitation. Tricuspid Valve: The tricuspid valve is grossly normal. Tricuspid valve regurgitation is trivial. Aortic Valve: The aortic valve is tricuspid. Aortic valve regurgitation is trivial. Mild aortic valve sclerosis is present, with no evidence of aortic valve stenosis. Pulmonic Valve: The pulmonic valve was grossly normal. Pulmonic valve regurgitation is trivial. Aorta: The aortic root and ascending aorta are structurally normal, with no evidence of dilitation. Venous: The inferior vena cava is normal in size with less than 50% respiratory variability, suggesting right atrial pressure of 8 mmHg.  IAS/Shunts: The interatrial septum was not well visualized.  LEFT VENTRICLE PLAX 2D LVIDd:         5.90 cm      Diastology LVIDs:         5.20 cm      LV e' lateral:   5.63 cm/s LV PW:         1.00 cm      LV E/e' lateral: 12.0 LV IVS:        0.90 cm      LV e' medial:    7.05 cm/s LVOT diam:     2.60 cm      LV E/e' medial:  9.5 LV SV:         54 LV SV Index:   25 LVOT Area:     5.31 cm  LV Volumes (MOD) LV vol d, MOD A2C: 121.0 ml LV vol d, MOD A4C: 121.0 ml LV vol s, MOD A2C: 96.4 ml LV vol s, MOD A4C: 90.7 ml LV SV MOD A2C:     24.6 ml LV SV MOD A4C:     121.0 ml LV SV MOD BP:      24.8 ml RIGHT VENTRICLE RV S prime:     8.92 cm/s TAPSE (M-mode): 2.2 cm LEFT ATRIUM              Index       RIGHT ATRIUM           Index LA diam:        4.50 cm  2.07 cm/m  RA Area:     21.60 cm LA Vol (A2C):   104.0 ml 47.83 ml/m RA Volume:   68.30 ml  31.41 ml/m LA Vol (A4C):   66.8 ml  30.72 ml/m LA Biplane Vol: 83.4 ml  38.36 ml/m  AORTIC VALVE LVOT Vmax:   57.30 cm/s LVOT Vmean:  40.600 cm/s LVOT VTI:    0.102 m  AORTA Ao Root diam: 3.60 cm MITRAL VALVE MV Area (PHT): 4.71 cm    SHUNTS MV Decel Time: 161 msec    Systemic VTI:  0.10 m MV E velocity: 67.30 cm/s  Systemic Diam: 2.60 cm MV A velocity: 37.70 cm/s MV E/A ratio:  1.79 Lyman Bishop MD Electronically signed by Lyman Bishop MD Signature Date/Time: 07/30/2019/11:47:28 AM    Final     Cardiac Studies   Echocardiogram 07/31/2019: Impressions: 1. Left ventricular ejection fraction, by estimation, is 25 to 30%. The  left ventricle has severely decreased function. The left ventricle  demonstrates global hypokinesis. Left ventricular diastolic function could  not be evaluated. There is severe  akinesis of the left ventricular, entire inferior wall and inferoseptal  wall.  2. Right ventricular systolic function was not well visualized. The right  ventricular size is not well visualized.  3. Left atrial size was mildly dilated.  4. The mitral valve is  grossly normal. Trivial mitral valve  regurgitation.  5. The aortic valve is tricuspid. Aortic valve regurgitation is trivial.  Mild aortic valve sclerosis is present, with no evidence of aortic valve  stenosis.  6. The inferior vena cava is normal in size with <50% respiratory  variability, suggesting right atrial pressure of 8 mmHg.   Comparison(s): Changes from prior study are noted. 10/31/2017: LVEF 35-40%,  global hypokinesis, worse inferiorly.   Patient Profile   Mr. Yarter is a 69 y.o. male with a history of non-obstructive CAD on cardiac cath in [id number], chronic systolic CHF/ nonischemic cardiomyopathy, atrial fibrillation on xarelto, AV block so not on  AV nodal blocking agents, and recent COVID infection in 05/2019 who presented with chest pain and shortness of breath.   Assessment & Plan    Acute on Chronic Systolic CHF/Non-Ischemic Cardiomyopathy\ - BNP elevated at 362. - Chest x-ray showed ill-defined airspace opacities within the right mid to lower lung field that have progressed since imaging in 05/2019. Findings are non-specific but could reflect pneumonia or asymmetric edema. CT showed right > left ground-glass attenuation (again non-specific) and small pleural effusion(right > left).  - Echo this admission showed LVEF of 25-30% with global hypokinesis and severe akinesis of the entire inferior and inferoseptal wall. EF 35-40% on Echo in 10/2017. Wall motion abnormalities in the inferior wall have been noted before. - IV Lasix decreased to 40mg  daily yesterday. Documented urinary output of 3.2 L in the past 24 hours. No weights in system since 3/29. Renal function stable.  - Decreased breath sounds noted on right. Will continue current dose of Lasix for now. Delene Loll 49-51mg  twice daily started on 3/30. - BP stable so will add Spironolactone 12.5mg  daily today. - Continue Imdur 30mg  daily.  - Will ask RN to weigh patient. Continue to monitor daily weights and strict I/O's.    Demand Ischemia - Troponin minimally elevated and flat at 52 >>58. Not consistent with ACS. - Reassuring that patient had minimal disease on cardiac cath in 10/2017. - Suspect demand ischemia in setting of acute on chronic CHF and atrial fibrillation.  Non-Obstructive CAD - Minimal CAD noted on cardiac cath in 10/2017.  - Brief episodes of chest tightness yesterday but none since. - Continue statin. No aspirin due to need for Xarelto.   Paroxysmal/Persistent Atrial Fibrillation - Remains in atrial fibrillation with rates mostly in the 80's to low 100's but as high as the 120's to 140's at times. Also had few runs of non-sustained VT.  - Potassium 4.2. - Will check Magnesium.  - Not on any beta-blocker due to history of AV blocker.  - Continue chronic anticoagulation with Xarelto 20mg  daily.  - Consider DCCV as atrial fibrillation may be exacerbating CHF. Also could consider adding Digoxin given reduced EF. Will discuss with MD.  Dyslipidemia - LDL 32 in 06/2019. - Continue Pravastatin 80mg  daily.    For questions or updates, please contact [place] Please consult [email or web address] for contact info under        Signed, Darreld Mclean, PA-C  07/31/2019, 9:08 AM    Attending Note:   The patient was seen and examined.  Agree with assessment and plan as noted above.  Changes made to the above note as needed.  Patient seen and independently examined with Sande Rives, PA .   We discussed all aspects of the encounter. I agree with the assessment and plan as stated above.  1.   A/C combined CHF:   Gradually titrating meds up. Adding spiro 12.5 mg a day  Net diuresis is 3.8 liters so far this admission  Will ambulate in the hallway  DC on lasix 20 mg a day  Spironolactone 12.5 a day  entresto 49-51 BID Imdur 30 mg a day   We have discussed low salt diet.  2.  PAF:  Cont xarelto.   We can discuss cardioversion after his CHF is adequately treated.   Will have him follow  up with APP in 3 weeks  3.  Hyperlipidemia :  Cont Prava 80.      I have spent a total of 40 minutes with patient  reviewing hospital  notes , telemetry, EKGs, labs and examining patient as well as establishing an assessment and plan that was discussed with the patient. > 50% of time was spent in direct patient care.    Thayer Headings, Brooke Bonito., MD, [facility] 07/31/2019, 10:49 AM [address],  [place] Pager [phone]

## 2019-07-31 NOTE — Progress Notes (Signed)
Marvin Chandler, NT ambulated with pt up and down the hallway and pt tolerated well, pt O2 stayed up and stable, he was not SOB or tachy.

## 2019-08-01 ENCOUNTER — Telehealth: Payer: Self-pay | Admitting: Cardiovascular Disease

## 2019-08-01 NOTE — Telephone Encounter (Signed)
Reviewed questions from patient's wife.  The patient did not know his medicines when he went to hospital so some of what he told them he was taking was incorrect.  1.) list says continue zyrtec (patient takes loratidine) - adv to continue loratidine, just nothing with decongestant   2.) list says stop omeprazole (patient takes famotidine) - adv to continue famotidine but discuss with PCP due to abd tenderness and elevated bilirubin according to dc summary  3.) list says continue pravastatin 80 mg (patient takes atorvastatin 80 mg) - adv to continue pravastatin 80 mg  4.) takes gabapentin - wants to know if can continue--adv to ask PCP.  5.) was sent home with Elipta-an inhaler - it is not on his list at all.  Adv to not use that one.  Has Spiriva and Symbicort and will continue those.   Adv to have patient bring medications to the follow up visit with Gerrianne Scale on 08/22/19.  Seeing PCP next week and will have the non cardiac concerns addressed at that time.

## 2019-08-01 NOTE — Telephone Encounter (Signed)
New Message:     Pt was discharged from the hospital yesterday. Wife says she needs a call please. She says his medicine is different on his discharge than the medicine he takes.

## 2019-08-06 ENCOUNTER — Telehealth: Payer: Self-pay | Admitting: Cardiovascular Disease

## 2019-08-06 ENCOUNTER — Other Ambulatory Visit: Payer: Medicare Other

## 2019-08-06 ENCOUNTER — Other Ambulatory Visit: Payer: Self-pay

## 2019-08-06 ENCOUNTER — Other Ambulatory Visit: Payer: Self-pay | Admitting: Student

## 2019-08-06 NOTE — Telephone Encounter (Signed)
Left VM for patient that I did not call him today and do not see in his chart where anyone has called him today. Adv to call back if he has more info as far as who would have been calling him.

## 2019-08-06 NOTE — Telephone Encounter (Signed)
Patient states he is returning a call from today. 

## 2019-08-07 LAB — COMPREHENSIVE METABOLIC PANEL
ALT: 18 IU/L (ref 0–44)
AST: 17 IU/L (ref 0–40)
Albumin/Globulin Ratio: 1.5 (ref 1.2–2.2)
Albumin: 3.9 g/dL (ref 3.8–4.8)
Alkaline Phosphatase: 99 IU/L (ref 39–117)
BUN/Creatinine Ratio: 10 (ref 10–24)
BUN: 13 mg/dL (ref 8–27)
Bilirubin Total: 0.6 mg/dL (ref 0.0–1.2)
CO2: 26 mmol/L (ref 20–29)
Calcium: 9.8 mg/dL (ref 8.6–10.2)
Chloride: 102 mmol/L (ref 96–106)
Creatinine, Ser: 1.24 mg/dL (ref 0.76–1.27)
GFR calc Af Amer: 68 mL/min/{1.73_m2} (ref >59)
GFR calc non Af Amer: 59 mL/min/{1.73_m2} — ABNORMAL LOW (ref >59)
Globulin, Total: 2.6 g/dL (ref 1.5–4.5)
Glucose: 116 mg/dL — ABNORMAL HIGH (ref 65–99)
Potassium: 4.5 mmol/L (ref 3.5–5.2)
Sodium: 140 mmol/L (ref 134–144)
Total Protein: 6.5 g/dL (ref 6.0–8.5)

## 2019-08-20 NOTE — Progress Notes (Signed)
Cardiology Office Note    Date:  08/21/2019   ID:  Marvin Chandler, DOB 1951/04/26, MRN [id number]  PCP:  [facility]  Cardiologist: Lauree Chandler, MD EPS: None  Chief Complaint  Patient presents with  . Hospitalization Follow-up    History of Present Illness:  Marvin Chandler is a 69 y.o. male with a history of non-obstructive CAD on cardiac cath in 0/9628, chronic systolic CHF/ nonischemic cardiomyopathy, atrial fibrillation on xarelto, AV block so not on AV nodal blocking agents, and recent COVID infection in 05/2019 who is followed by Dr. Angelena Form. He was last seen by Dr. Angelena Form on 06/14/2019 at which time he was in atrial fibrillation, with rates in the 90's, but was doing well from a cardiac standpoint.   Patient was discharged from the hospital 07/31/2019 after being treated for acute on chronic systolic CHF.  Echo LVEF 25 to 30% with global hypokinesis and severe akinesis of the entire inferior and inferoseptal wall.  EF 35 to 40% on echo in 10/2017.  Wall motion abnormalities in the inferior wall were noted before.  Patient was treated with IV Lasix and losartan was stopped and he was started on Entresto Imdur and spironolactone.  He diuresed 3.8 L and was discharged at a weight of 213.6 pounds.  Remained in atrial fibrillation rates 80-100 with a few short runs of NSVT asymptomatic.  He was maintained on Xarelto bilirubin was also elevated and he was asked to follow-up with PCP.  Patient comes in for f/u. Weight 215 at home today, 217 here. Denies chest pain, dyspnea, edema. Has some phlegm from asthma and allergies.labs 08/06/19 reviewed. Crt 1.24 on 08/06/19. Weight staying around 213-215 at home and watching salt closely.   Past Medical History:  Diagnosis Date  . Arthritis of hip    bilateral  . Asthma   . CAD (coronary artery disease)    LHC 5/16:  Dx ostial 30%, LAD luminal irregs, EF 40%  . Chronic atrial fibrillation ([place]) 07/29/2019  . Chronic systolic  CHF (congestive heart failure) ([place])    a. Echo 3/16:  inf and inf-lat HK, mild LVH, EF 45%, mild to mod LAE, normal RVF  . Cough   . Hiatal hernia   . NICM (nonischemic cardiomyopathy) ([place])   . Osteoarthritis   . Personal history of colonic adenomas 02/20/2013  . Pneumonia     Past Surgical History:  Procedure Laterality Date  . COLONOSCOPY  02/13/13  . LEFT AND RIGHT HEART CATHETERIZATION WITH CORONARY ANGIOGRAM N/A 08/18/2014   Procedure: LEFT AND RIGHT HEART CATHETERIZATION WITH CORONARY ANGIOGRAM;  Surgeon: [name], MD;  Location: [facility] C[facility] LAB;  Service: Cardiovascular;  Laterality: N/A;  . LEFT HEART CATH AND CORONARY ANGIOGRAPHY N/A 11/09/2017   Procedure: LEFT HEART CATH AND CORONARY ANGIOGRAPHY;  Surgeon: [name], MD;  Location: [facility] CV LAB;  Service: Cardiovascular;  Laterality: N/A;  . TOTAL HIP ARTHROPLASTY  2002   bilateral    Current Medications: Current Meds  Medication Sig  . albuterol (PROVENTIL) (2.5 MG/3ML) 0.083% nebulizer solution Take 3 mLs (2.5 mg total) by nebulization every 6 (six) hours as needed for wheezing or shortness of breath.  Marland Kitchen albuterol (VENTOLIN HFA) 108 (90 Base) MCG/ACT inhaler Inhale 2 puffs into the lungs every 4 (four) hours as needed.  . budesonide-formoterol (SYMBICORT) 160-4.5 MCG/ACT inhaler Inhale 2 puffs into the lungs daily.  . cetirizine (ZYRTEC) 10 MG tablet Take 10 mg by mouth daily.  Marland Kitchen  EPINEPHrine 0.3 mg/0.3 mL IJ SOAJ injection Inject 0.3 mLs (0.3 mg total) into the muscle once.  . fluticasone (FLONASE) 50 MCG/ACT nasal spray Place 2 sprays into both nostrils daily.  . furosemide (LASIX) 20 MG tablet Take 1 tablet (20 mg total) by mouth daily.  . isosorbide mononitrate (IMDUR) 30 MG 24 hr tablet Take 1 tablet (30 mg total) by mouth daily.  . montelukast (SINGULAIR) 10 MG tablet TAKE 1 TABLET BY MOUTH AT BEDTIME  . Nebulizers MISC by Does not apply route 4 (four) times daily. Use with nebulizer four times  a day as needed for wheezing or sob  . nitroGLYCERIN (NITROSTAT) 0.4 MG SL tablet Place 1 tablet (0.4 mg total) under the tongue every 5 (five) minutes as needed. (Patient taking differently: Place 0.4 mg under the tongue every 5 (five) minutes as needed for chest pain. )  . pravastatin (PRAVACHOL) 80 MG tablet Take 1 tablet (80 mg total) by mouth every evening.  . rivaroxaban (XARELTO) 20 MG TABS tablet Take 1 tablet (20 mg total) by mouth daily with supper.  . sacubitril-valsartan (ENTRESTO) 49-51 MG Take 1 tablet by mouth 2 (two) times daily.  Marland Kitchen SPIRIVA HANDIHALER 18 MCG inhalation capsule INHALE THE CONTENTS OF 1 CAPSULE VIA HANDIHALER BY MOUTH DAILY (Patient taking differently: Place 18 mcg into inhaler and inhale daily. )  . spironolactone (ALDACTONE) 25 MG tablet Take 0.5 tablets (12.5 mg total) by mouth daily.     Allergies:   Patient has no known allergies.   Social History   Socioeconomic History  . Marital status: Married    Spouse name: Not on file  . Number of children: 2  . Years of education: Not on file  . Highest education level: Not on file  Occupational History  . Occupation: Scientist, water quality: [employer]  Tobacco Use  . Smoking status: Former Smoker    Packs/day: 1.00    Years: 7.00    Pack years: 7.00    Types: Cigarettes    Quit date: 11/05/1997    Years since quitting: 21.8  . Smokeless tobacco: Never Used  Substance and Sexual Activity  . Alcohol use: No    Alcohol/week: 0.0 standard drinks  . Drug use: No  . Sexual activity: Not on file  Other Topics Concern  . Not on file  Social History Narrative  . Not on file   Social Determinants of Health   Financial Resource Strain:   . Difficulty of Paying Living Expenses:   Food Insecurity:   . Worried About Charity fundraiser in the Last Year:   . Arboriculturist in the Last Year:   Transportation Needs:   . Film/video editor (Medical):   Marland Kitchen Lack of  Transportation (Non-Medical):   Physical Activity:   . Days of Exercise per Week:   . Minutes of Exercise per Session:   Stress:   . Feeling of Stress :   Social Connections:   . Frequency of Communication with Friends and Family:   . Frequency of Social Gatherings with Friends and Family:   . Attends Religious Services:   . Active Member of Clubs or Organizations:   . Attends Archivist Meetings:   Marland Kitchen Marital Status:      Family History:  The patient's   family history includes Cancer in his maternal aunt and another family member; Dementia in his mother; Diabetes in his cousin; Stroke in  his cousin.   ROS:   Please see the history of present illness.    ROS All other systems reviewed and are negative.   PHYSICAL EXAM:   VS:  BP (!) 120/54   Pulse 83   Ht '5\' 11"'$  (1.803 m)   Wt 217 lb (98.4 kg)   SpO2 98%   BMI 30.27 kg/m   Physical Exam  GEN: Well nourished, well developed, in no acute distress  Neck: no JVD, carotid bruits, or masses Cardiac:RRR; no murmurs, rubs, or gallops  Respiratory:  clear to auscultation bilaterally, normal work of breathing GI: soft, nontender, nondistended, + BS Ext: Mild edema bilaterally left greater than right without cyanosis, clubbing,Good distal pulses bilaterally Neuro:  Alert and Oriented x 3, Strength and sensation are intact Psych: euthymic mood, full affect  Wt Readings from Last 3 Encounters:  08/21/19 217 lb (98.4 kg)  07/31/19 213 lb 9.6 oz (96.9 kg)  06/14/19 222 lb 1.9 oz (100.8 kg)      Studies/Labs Reviewed:   EKG:  EKG is not ordered today.   Recent Labs: 07/29/2019: B Natriuretic Peptide 362.2; Hemoglobin 14.1; Platelets 294 07/31/2019: Magnesium 2.0 08/06/2019: ALT 18; BUN 13; Creatinine, Ser 1.24; Potassium 4.5; Sodium 140   Lipid Panel    Component Value Date/Time   CHOL 75 (L) 06/14/2019 0830   TRIG 52 06/14/2019 0830   HDL 30 (L) 06/14/2019 0830   CHOLHDL 2.5 06/14/2019 0830   CHOLHDL 3.8  11/09/2017 0321   VLDL 19 11/09/2017 0321   LDLCALC 32 06/14/2019 0830    Additional studies/ records that were reviewed today include:  Echocardiogram 3/29/202021: Impressions: 1. Left ventricular ejection fraction, by estimation, is 25 to 30%. The  left ventricle has severely decreased function. The left ventricle  demonstrates global hypokinesis. Left ventricular diastolic function could  not be evaluated. There is severe  akinesis of the left ventricular, entire inferior wall and inferoseptal  wall.   2. Right ventricular systolic function was not well visualized. The right  ventricular size is not well visualized.   3. Left atrial size was mildly dilated.   4. The mitral valve is grossly normal. Trivial mitral valve  regurgitation.   5. The aortic valve is tricuspid. Aortic valve regurgitation is trivial.  Mild aortic valve sclerosis is present, with no evidence of aortic valve  stenosis.   6. The inferior vena cava is normal in size with <50% respiratory  variability, suggesting right atrial pressure of 8 mmHg.   Comparison(s): Changes from prior study are noted. 10/31/2017: LVEF 35-40%,  global hypokinesis, worse inferiorly.  _____________     ASSESSMENT:    1. NICM (nonischemic cardiomyopathy) ([place])   2. Chronic systolic CHF (congestive heart failure) ([facility])   3. Paroxysmal atrial fibrillation ([place])   4. COVID-19 virus infection      PLAN:  In order of problems listed above:  Nonischemic cardiomyopathy ejection fraction now 20 to 25% which is down from last echo.  Started on Entresto and spironolactone.  Overall compensated.  Check be met today and if renal functions okay titrate Entresto up to 97/103 mg twice daily.  Creatinine was 1.24 08/06/2019.  Chronic systolic CHF with recent hospitalization discharge weight 213 pounds, weight today 217 with shoes on.  He has not taken his Lasix yet.  Mild edema in his legs but overall pretty well compensated.  Will titrate  Entresto up if labs allow.  PAF on Xarelto has not been on AV nodal blocking agents asymptomatic  COVID-19 infection 05/2019    Medication Adjustments/Labs and Tests Ordered: Current medicines are reviewed at length with the patient today.  Concerns regarding medicines are outlined above.  Medication changes, Labs and Tests ordered today are listed in the Patient Instructions below. Patient Instructions  Medication Instructions:  Your physician recommends that you continue on your current medications as directed. Please refer to the Current Medication list given to you today.  *If you need a refill on your cardiac medications before your next appointment, please call your pharmacy*   Lab Work: TODAY: BMET  If you have labs (blood work) drawn today and your tests are completely normal, you will receive your results only by: Marland Kitchen MyChart Message (if you have MyChart) OR . A paper copy in the mail If you have any lab test that is abnormal or we need to change your treatment, we will call you to review the results.   Testing/Procedures: None ordered   Follow-Up: Follow up with Ermalinda Barrios, PA on 09/25/19 at 12:45 PM  Other Instructions      Signed, Ermalinda Barrios, PA-C  08/21/2019 1:24 PM    [facility] [place], [place], [place]  [address] Phone: [phone]; Fax: [phone]

## 2019-08-21 ENCOUNTER — Encounter: Payer: Self-pay | Admitting: Physician Assistant

## 2019-08-21 ENCOUNTER — Ambulatory Visit (INDEPENDENT_AMBULATORY_CARE_PROVIDER_SITE_OTHER): Payer: Medicare Other | Admitting: Physician Assistant

## 2019-08-21 ENCOUNTER — Other Ambulatory Visit: Payer: Self-pay

## 2019-08-21 VITALS — BP 120/54 | HR 83 | Ht 71.0 in | Wt 217.0 lb

## 2019-08-21 DIAGNOSIS — U071 COVID-19: Secondary | ICD-10-CM | POA: Diagnosis not present

## 2019-08-21 DIAGNOSIS — I428 Other cardiomyopathies: Secondary | ICD-10-CM

## 2019-08-21 DIAGNOSIS — I480 Paroxysmal atrial fibrillation: Secondary | ICD-10-CM

## 2019-08-21 DIAGNOSIS — I2510 Atherosclerotic heart disease of native coronary artery without angina pectoris: Secondary | ICD-10-CM

## 2019-08-21 DIAGNOSIS — I5022 Chronic systolic (congestive) heart failure: Secondary | ICD-10-CM | POA: Diagnosis not present

## 2019-08-21 NOTE — Patient Instructions (Signed)
Medication Instructions:  Your physician recommends that you continue on your current medications as directed. Please refer to the Current Medication list given to you today.  *If you need a refill on your cardiac medications before your next appointment, please call your pharmacy*   Lab Work: TODAY: BMET  If you have labs (blood work) drawn today and your tests are completely normal, you will receive your results only by: Marland Kitchen MyChart Message (if you have MyChart) OR . A paper copy in the mail If you have any lab test that is abnormal or we need to change your treatment, we will call you to review the results.   Testing/Procedures: None ordered   Follow-Up: Follow up with Ermalinda Barrios, PA on 09/25/19 at 12:45 PM  Other Instructions

## 2019-08-22 ENCOUNTER — Other Ambulatory Visit: Payer: Self-pay

## 2019-08-22 DIAGNOSIS — I428 Other cardiomyopathies: Secondary | ICD-10-CM

## 2019-08-22 LAB — BASIC METABOLIC PANEL
BUN/Creatinine Ratio: 9 — ABNORMAL LOW (ref 10–24)
BUN: 11 mg/dL (ref 8–27)
CO2: 28 mmol/L (ref 20–29)
Calcium: 9.5 mg/dL (ref 8.6–10.2)
Chloride: 105 mmol/L (ref 96–106)
Creatinine, Ser: 1.24 mg/dL (ref 0.76–1.27)
GFR calc Af Amer: 68 mL/min/{1.73_m2} (ref >59)
GFR calc non Af Amer: 59 mL/min/{1.73_m2} — ABNORMAL LOW (ref >59)
Glucose: 102 mg/dL — ABNORMAL HIGH (ref 65–99)
Potassium: 4.4 mmol/L (ref 3.5–5.2)
Sodium: 145 mmol/L — ABNORMAL HIGH (ref 134–144)

## 2019-08-22 MED ORDER — ENTRESTO 97-103 MG PO TABS: 1.0000 | ORAL_TABLET | Freq: Two times a day (BID) | ORAL | 11 refills | Status: DC

## 2019-08-27 MED ORDER — ENTRESTO 97-103 MG PO TABS: 1.0000 | ORAL_TABLET | Freq: Two times a day (BID) | ORAL | 11 refills | Status: AC

## 2019-09-05 ENCOUNTER — Other Ambulatory Visit: Payer: Self-pay

## 2019-09-05 ENCOUNTER — Other Ambulatory Visit: Payer: Medicare Other | Admitting: *Deleted

## 2019-09-05 DIAGNOSIS — I428 Other cardiomyopathies: Secondary | ICD-10-CM

## 2019-09-06 LAB — BASIC METABOLIC PANEL
BUN/Creatinine Ratio: 13 (ref 10–24)
BUN: 15 mg/dL (ref 8–27)
CO2: 25 mmol/L (ref 20–29)
Calcium: 9.3 mg/dL (ref 8.6–10.2)
Chloride: 103 mmol/L (ref 96–106)
Creatinine, Ser: 1.12 mg/dL (ref 0.76–1.27)
GFR calc Af Amer: 77 mL/min/{1.73_m2} (ref >59)
GFR calc non Af Amer: 66 mL/min/{1.73_m2} (ref >59)
Glucose: 151 mg/dL — ABNORMAL HIGH (ref 65–99)
Potassium: 4.4 mmol/L (ref 3.5–5.2)
Sodium: 141 mmol/L (ref 134–144)

## 2019-09-18 NOTE — Progress Notes (Signed)
Cardiology Office Note    Date:  09/25/2019   ID:  Marvin Chandler, DOB 12/13/1950, MRN [id number]  PCP:  [facility]  Cardiologist: Lauree Chandler, MD EPS: None  No chief complaint on file.   History of Present Illness:  Marvin Chandler is a 69 y.o. male  with a history of non-obstructive CAD on cardiac cath in [id number], chronic systolic CHF/ nonischemic cardiomyopathy, atrial fibrillation on xarelto, AV block so not on AV nodal blocking agents, and recent COVID infection in 05/2019 who is followed by Dr. Angelena Form. He was last seen by Dr. Angelena Form on 06/14/2019 at which time he was in atrial fibrillation, with rates in the 90's, but was doing well from a cardiac standpoint.    Patient was discharged from the hospital 07/31/2019 after being treated for acute on chronic systolic CHF.  Echo LVEF 25 to 30% with global hypokinesis and severe akinesis of the entire inferior and inferoseptal wall.  EF 35 to 40% on echo in 10/2017.  Wall motion abnormalities in the inferior wall were noted before.  Patient was treated with IV Lasix and losartan was stopped and he was started on Entresto Imdur and spironolactone.  He diuresed 3.8 L and was discharged at a weight of 213.6 pounds.  Remained in atrial fibrillation rates 80-100 with a few short runs of NSVT asymptomatic.  He was maintained on Xarelto bilirubin was also elevated and he was asked to follow-up with PCP.   I saw the patient back 08/21/2019 at which time I titrated his Entresto up to 97/103 mg twice daily.  Weight was 217 that day.  Patient comes in for f/u. Feeling much better. Still has some clear phlegm and rattle in his chest but improving. He can go up steps without getting short of breath or stopping. Weight 221 but has a lot in his pocket. Weight at home 213-215 at home. BP 108/60 yest at [facility] lower today but no dizziness.    Past Medical History:  Diagnosis Date  . Arthritis of hip    bilateral  . Asthma   . CAD  (coronary artery disease)    LHC 5/16:  Dx ostial 30%, LAD luminal irregs, EF 40%  . Chronic atrial fibrillation ([place]) 07/29/2019  . Chronic systolic CHF (congestive heart failure) ([place])    a. Echo 3/16:  inf and inf-lat HK, mild LVH, EF 45%, mild to mod LAE, normal RVF  . Cough   . Hiatal hernia   . NICM (nonischemic cardiomyopathy) ([place])   . Osteoarthritis   . Personal history of colonic adenomas 02/20/2013  . Pneumonia     Past Surgical History:  Procedure Laterality Date  . COLONOSCOPY  02/13/13  . LEFT AND RIGHT HEART CATHETERIZATION WITH CORONARY ANGIOGRAM N/A 08/18/2014   Procedure: LEFT AND RIGHT HEART CATHETERIZATION WITH CORONARY ANGIOGRAM;  Surgeon: [name], MD;  Location: [facility] CATH LAB;  Service: Cardiovascular;  Laterality: N/A;  . LEFT HEART CATH AND CORONARY ANGIOGRAPHY N/A 11/09/2017   Procedure: LEFT HEART CATH AND CORONARY ANGIOGRAPHY;  Surgeon: [name], MD;  Location: [place] CV LAB;  Service: Cardiovascular;  Laterality: N/A;  . TOTAL HIP ARTHROPLASTY  2002   bilateral    Current Medications: Current Meds  Medication Sig  . albuterol (PROVENTIL) (2.5 MG/3ML) 0.083% nebulizer solution Take 3 mLs (2.5 mg total) by nebulization every 6 (six) hours as needed for wheezing or shortness of breath.  Marland Kitchen albuterol (VENTOLIN HFA) 108 (90 Base) MCG/ACT inhaler  Inhale 2 puffs into the lungs every 4 (four) hours as needed.  . budesonide-formoterol (SYMBICORT) 160-4.5 MCG/ACT inhaler Inhale 2 puffs into the lungs daily.  . cetirizine (ZYRTEC) 10 MG tablet Take 10 mg by mouth daily.  Marland Kitchen EPINEPHrine 0.3 mg/0.3 mL IJ SOAJ injection Inject 0.3 mLs (0.3 mg total) into the muscle once.  . fluticasone (FLONASE) 50 MCG/ACT nasal spray Place 2 sprays into both nostrils daily.  . furosemide (LASIX) 20 MG tablet Take 1 tablet (20 mg total) by mouth daily.  . isosorbide mononitrate (IMDUR) 30 MG 24 hr tablet Take 1 tablet (30 mg total) by mouth daily.  . montelukast  (SINGULAIR) 10 MG tablet TAKE 1 TABLET BY MOUTH AT BEDTIME  . Nebulizers MISC by Does not apply route 4 (four) times daily. Use with nebulizer four times a day as needed for wheezing or sob  . nitroGLYCERIN (NITROSTAT) 0.4 MG SL tablet Place 1 tablet (0.4 mg total) under the tongue every 5 (five) minutes as needed.  . pravastatin (PRAVACHOL) 80 MG tablet Take 1 tablet (80 mg total) by mouth every evening.  . rivaroxaban (XARELTO) 20 MG TABS tablet Take 1 tablet (20 mg total) by mouth daily with supper.  . sacubitril-valsartan (ENTRESTO) 97-103 MG Take 1 tablet by mouth 2 (two) times daily.  Marland Kitchen SPIRIVA HANDIHALER 18 MCG inhalation capsule INHALE THE CONTENTS OF 1 CAPSULE VIA HANDIHALER BY MOUTH DAILY  . spironolactone (ALDACTONE) 25 MG tablet Take 0.5 tablets (12.5 mg total) by mouth daily.     Allergies:   Patient has no known allergies.   Social History   Socioeconomic History  . Marital status: Married    Spouse name: Not on file  . Number of children: 2  . Years of education: Not on file  . Highest education level: Not on file  Occupational History  . Occupation: Scientist, water quality: [employer]  Tobacco Use  . Smoking status: Former Smoker    Packs/day: 1.00    Years: 7.00    Pack years: 7.00    Types: Cigarettes    Quit date: 11/05/1997    Years since quitting: 21.9  . Smokeless tobacco: Never Used  Substance and Sexual Activity  . Alcohol use: No    Alcohol/week: 0.0 standard drinks  . Drug use: No  . Sexual activity: Not on file  Other Topics Concern  . Not on file  Social History Narrative  . Not on file   Social Determinants of Health   Financial Resource Strain:   . Difficulty of Paying Living Expenses:   Food Insecurity:   . Worried About Charity fundraiser in the Last Year:   . Arboriculturist in the Last Year:   Transportation Needs:   . Film/video editor (Medical):   Marland Kitchen Lack of Transportation (Non-Medical):   Physical  Activity:   . Days of Exercise per Week:   . Minutes of Exercise per Session:   Stress:   . Feeling of Stress :   Social Connections:   . Frequency of Communication with Friends and Family:   . Frequency of Social Gatherings with Friends and Family:   . Attends Religious Services:   . Active Member of Clubs or Organizations:   . Attends Archivist Meetings:   Marland Kitchen Marital Status:      Family History:  The patient's family history includes Cancer in his maternal aunt and another family member; Dementia in  his mother; Diabetes in his cousin; Stroke in his cousin.   ROS:   Please see the history of present illness.    ROS All other systems reviewed and are negative.   PHYSICAL EXAM:   VS:  BP (!) 90/56   Pulse 83   Ht 5\' 11"  (1.803 m)   Wt 221 lb (100.2 kg)   SpO2 97%   BMI 30.82 kg/m   Physical Exam  GEN: Well nourished, well developed, in no acute distress  Neck: no JVD, carotid bruits, or masses Cardiac:RRR; no murmurs, rubs, or gallops  Respiratory:  clear to auscultation bilaterally, normal work of breathing GI: soft, nontender, nondistended, + BS Ext: without cyanosis, clubbing, or edema, Good distal pulses bilaterally Neuro:  Alert and Oriented x 3 Psych: euthymic mood, full affect  Wt Readings from Last 3 Encounters:  09/25/19 221 lb (100.2 kg)  08/21/19 217 lb (98.4 kg)  07/31/19 213 lb 9.6 oz (96.9 kg)      Studies/Labs Reviewed:   EKG:  EKG is not ordered today.    Recent Labs: 07/29/2019: B Natriuretic Peptide 362.2; Hemoglobin 14.1; Platelets 294 07/31/2019: Magnesium 2.0 08/06/2019: ALT 18 09/05/2019: BUN 15; Creatinine, Ser 1.12; Potassium 4.4; Sodium 141   Lipid Panel    Component Value Date/Time   CHOL 75 (L) 06/14/2019 0830   TRIG 52 06/14/2019 0830   HDL 30 (L) 06/14/2019 0830   CHOLHDL 2.5 06/14/2019 0830   CHOLHDL 3.8 11/09/2017 0321   VLDL 19 11/09/2017 0321   LDLCALC 32 06/14/2019 0830    Additional studies/ records that were  reviewed today include:  Echocardiogram 3/29/202021: Impressions: 1. Left ventricular ejection fraction, by estimation, is 25 to 30%. The  left ventricle has severely decreased function. The left ventricle  demonstrates global hypokinesis. Left ventricular diastolic function could  not be evaluated. There is severe  akinesis of the left ventricular, entire inferior wall and inferoseptal  wall.   2. Right ventricular systolic function was not well visualized. The right  ventricular size is not well visualized.   3. Left atrial size was mildly dilated.   4. The mitral valve is grossly normal. Trivial mitral valve  regurgitation.   5. The aortic valve is tricuspid. Aortic valve regurgitation is trivial.  Mild aortic valve sclerosis is present, with no evidence of aortic valve  stenosis.   6. The inferior vena cava is normal in size with <50% respiratory  variability, suggesting right atrial pressure of 8 mmHg.   Comparison(s): Changes from prior study are noted. 10/31/2017: LVEF 35-40%,  global hypokinesis, worse inferiorly.  _____________           ASSESSMENT:    1. NICM (nonischemic cardiomyopathy) ([place])   2. Chronic systolic CHF (congestive heart failure) ([facility])   3. Paroxysmal atrial fibrillation ([place])   4. COVID-19 virus infection      PLAN:  In order of problems listed above:  Nonischemic cardiomyopathy ejection fraction 20 to 25% started on Entresto and spironolactone.  I increased Entresto to 97/103 mg twice daily 08/21/2019 creatinine 1.12 on 09/05/2019-recheck renal today. Repeat echo in 3 months.  Chronic systolic CHF discharged hospital weight 213 pounds-stable weights at home. Had a lot in his pockets today and weight 221. No CHF on exam  PAF on Xarelto not on AV nodal blocking agents  COVID-19 infection 05/2019  Medication Adjustments/Labs and Tests Ordered: Current medicines are reviewed at length with the patient today.  Concerns regarding medicines are  outlined above.  Medication  changes, Labs and Tests ordered today are listed in the Patient Instructions below. Patient Instructions  Medication Instructions:  Your physician recommends that you continue on your current medications as directed. Please refer to the Current Medication list given to you today.  *If you need a refill on your cardiac medications before your next appointment, please call your pharmacy*   Lab Work: TODAY: BMET  If you have labs (blood work) drawn today and your tests are completely normal, you will receive your results only by: Marland Kitchen MyChart Message (if you have MyChart) OR . A paper copy in the mail If you have any lab test that is abnormal or we need to change your treatment, we will call you to review the results.   Testing/Procedures: Your physician has requested that you have an echocardiogram in 3 months. Echocardiography is a painless test that uses sound waves to create images of your heart. It provides your doctor with information about the size and shape of your heart and how well your heart's chambers and valves are working. This procedure takes approximately one hour. There are no restrictions for this procedure.   Follow-Up: At [facility], you and your health needs are our priority.  As part of our continuing mission to provide you with exceptional heart care, we have created designated Provider Care Teams.  These Care Teams include your primary Cardiologist (physician) and Advanced Practice Providers (APPs -  Physician Assistants and Nurse Practitioners) who all work together to provide you with the care you need, when you need it.  We recommend signing up for the patient portal called "MyChart".  Sign up information is provided on this After Visit Summary.  MyChart is used to connect with patients for Virtual Visits (Telemedicine).  Patients are able to view lab/test results, encounter notes, upcoming appointments, etc.  Non-urgent messages can be  sent to your provider as well.   To learn more about what you can do with MyChart, go to [email or web address].    Your next appointment:   3 month(s)  The format for your next appointment:   In Person  Provider:   You may see Lauree Chandler, MD or one of the following Advanced Practice Providers on your designated Care Team:    Melina Copa, PA-C  Ermalinda Barrios, PA-C    Other Instructions Call if you experience any dizziness     Signed, Ermalinda Barrios, PA-C  09/25/2019 1:12 PM    [address], [place], [place]  [address] Phone: [phone]; Fax: [phone]

## 2019-09-25 ENCOUNTER — Ambulatory Visit (INDEPENDENT_AMBULATORY_CARE_PROVIDER_SITE_OTHER): Payer: Medicare Other | Admitting: Physician Assistant

## 2019-09-25 ENCOUNTER — Encounter: Payer: Self-pay | Admitting: Physician Assistant

## 2019-09-25 ENCOUNTER — Other Ambulatory Visit: Payer: Self-pay

## 2019-09-25 VITALS — BP 90/56 | HR 83 | Ht 71.0 in | Wt 221.0 lb

## 2019-09-25 DIAGNOSIS — I480 Paroxysmal atrial fibrillation: Secondary | ICD-10-CM

## 2019-09-25 DIAGNOSIS — I5022 Chronic systolic (congestive) heart failure: Secondary | ICD-10-CM | POA: Diagnosis not present

## 2019-09-25 DIAGNOSIS — I2510 Atherosclerotic heart disease of native coronary artery without angina pectoris: Secondary | ICD-10-CM

## 2019-09-25 DIAGNOSIS — I428 Other cardiomyopathies: Secondary | ICD-10-CM | POA: Diagnosis not present

## 2019-09-25 DIAGNOSIS — U071 COVID-19: Secondary | ICD-10-CM

## 2019-09-25 NOTE — Patient Instructions (Signed)
Medication Instructions:  Your physician recommends that you continue on your current medications as directed. Please refer to the Current Medication list given to you today.  *If you need a refill on your cardiac medications before your next appointment, please call your pharmacy*   Lab Work: TODAY: BMET  If you have labs (blood work) drawn today and your tests are completely normal, you will receive your results only by: Marland Kitchen MyChart Message (if you have MyChart) OR . A paper copy in the mail If you have any lab test that is abnormal or we need to change your treatment, we will call you to review the results.   Testing/Procedures: Your physician has requested that you have an echocardiogram in 3 months. Echocardiography is a painless test that uses sound waves to create images of your heart. It provides your doctor with information about the size and shape of your heart and how well your heart's chambers and valves are working. This procedure takes approximately one hour. There are no restrictions for this procedure.   Follow-Up: At [facility], you and your health needs are our priority.  As part of our continuing mission to provide you with exceptional heart care, we have created designated Provider Care Teams.  These Care Teams include your primary Cardiologist (physician) and Advanced Practice Providers (APPs -  Physician Assistants and Nurse Practitioners) who all work together to provide you with the care you need, when you need it.  We recommend signing up for the patient portal called "MyChart".  Sign up information is provided on this After Visit Summary.  MyChart is used to connect with patients for Virtual Visits (Telemedicine).  Patients are able to view lab/test results, encounter notes, upcoming appointments, etc.  Non-urgent messages can be sent to your provider as well.   To learn more about what you can do with MyChart, go to [email or web address].    Your next  appointment:   3 month(s)  The format for your next appointment:   In Person  Provider:   You may see Lauree Chandler, MD or one of the following Advanced Practice Providers on your designated Care Team:    Melina Copa, PA-C  Ermalinda Barrios, PA-C    Other Instructions Call if you experience any dizziness

## 2019-09-26 LAB — BASIC METABOLIC PANEL
BUN/Creatinine Ratio: 13 (ref 10–24)
BUN: 16 mg/dL (ref 8–27)
CO2: 25 mmol/L (ref 20–29)
Calcium: 9.7 mg/dL (ref 8.6–10.2)
Chloride: 103 mmol/L (ref 96–106)
Creatinine, Ser: 1.23 mg/dL (ref 0.76–1.27)
GFR calc Af Amer: 68 mL/min/{1.73_m2} (ref >59)
GFR calc non Af Amer: 59 mL/min/{1.73_m2} — ABNORMAL LOW (ref >59)
Glucose: 107 mg/dL — ABNORMAL HIGH (ref 65–99)
Potassium: 4.6 mmol/L (ref 3.5–5.2)
Sodium: 143 mmol/L (ref 134–144)

## 2019-10-29 ENCOUNTER — Ambulatory Visit (INDEPENDENT_AMBULATORY_CARE_PROVIDER_SITE_OTHER): Payer: No Typology Code available for payment source | Admitting: Orthopaedic Surgery

## 2019-10-29 ENCOUNTER — Ambulatory Visit (INDEPENDENT_AMBULATORY_CARE_PROVIDER_SITE_OTHER): Payer: No Typology Code available for payment source

## 2019-10-29 ENCOUNTER — Encounter: Payer: Self-pay | Admitting: Orthopaedic Surgery

## 2019-10-29 ENCOUNTER — Telehealth: Payer: Self-pay | Admitting: Orthopaedic Surgery

## 2019-10-29 VITALS — Ht 71.0 in | Wt 221.0 lb

## 2019-10-29 DIAGNOSIS — M25559 Pain in unspecified hip: Secondary | ICD-10-CM

## 2019-10-29 DIAGNOSIS — M25551 Pain in right hip: Secondary | ICD-10-CM | POA: Diagnosis not present

## 2019-10-29 DIAGNOSIS — M25552 Pain in left hip: Secondary | ICD-10-CM | POA: Diagnosis not present

## 2019-10-29 DIAGNOSIS — I2510 Atherosclerotic heart disease of native coronary artery without angina pectoris: Secondary | ICD-10-CM

## 2019-10-29 MED ORDER — PREDNISONE 10 MG (21) PO TBPK: ORAL_TABLET | ORAL | 0 refills | Status: DC

## 2019-10-29 NOTE — Telephone Encounter (Signed)
done

## 2019-10-29 NOTE — Telephone Encounter (Signed)
[facility] tell [facility] not [facility] so he needs to just get it from regular pharmacy. Thanks.

## 2019-10-29 NOTE — Telephone Encounter (Signed)
Patient notified

## 2019-10-29 NOTE — Telephone Encounter (Signed)
Pt called stating he would like to have his rx sent to the [facility] clinic in [place].   [phone]

## 2019-10-29 NOTE — Telephone Encounter (Signed)
Please advise.  Looks like prednisone was sent in today to [facility] instead.

## 2019-10-29 NOTE — Telephone Encounter (Signed)
[facility] She advised do not have Prednisone (Sterapred Uni Pak 21 tab) Olean Ree advised Rx can be faxed back to her or escribe for plain separate Prednisone tabs. The number to contact Olean Ree is [phone] Ext: [phone]    The fax# is [phone]

## 2019-10-29 NOTE — Progress Notes (Signed)
Office Visit Note   Patient: Marvin Chandler           Date of Birth: August 08, 1950           MRN: [id number] Visit Date: 10/29/2019              Requested by: [facility] [place],  [place] [address] PCP: [facility], [place]: Visit Diagnoses:  1. Hip pain     Plan: Overall impression is bilateral hip pain that is likely due to abductor tendinopathy.  The history and exams are not consistent with loosening or problems with the hip replacements.  I have recommended physical therapy as well as a course of oral steroids.  Patient instructed to follow-up if he does not notice any improvement.  Follow-up as needed.  Follow-Up Instructions: Return if symptoms worsen or fail to improve.   Orders:  Orders Placed This Encounter  Procedures  . XR HIPS BILAT W OR W/O PELVIS 3-4 VIEWS  . Ambulatory referral to Physical Therapy   Meds ordered this encounter  Medications  . predniSONE (STERAPRED UNI-PAK 21 TAB) 10 MG (21) TBPK tablet    Sig: Take as directed    Dispense:  21 tablet    Refill:  0      Procedures: No procedures performed   Clinical Data: No additional findings.   Subjective: Chief Complaint  Patient presents with  . Left Hip - Pain, New Patient (Initial Visit)  . Right Hip - Pain    Marvin Chandler is a 69 year old gentleman comes in for bilateral hip pain on the lateral side that is worse on the right hip.  He denies any groin pain.  He has constant hip pain even at rest.  He states that he is getting woken up at night due to this and he is limping and hopping as a result.  Denies any numbness and tingling.  Rare thigh pain.   Review of Systems  Constitutional: Negative.   All other systems reviewed and are negative.    Objective: Vital Signs: Ht 5\' 11"  (1.803 m)   Wt 221 lb (100.2 kg)   BMI 30.82 kg/m   Physical Exam Vitals and nursing note reviewed.  Constitutional:      Appearance: He is  well-developed.  HENT:     Head: Normocephalic and atraumatic.  Eyes:     Pupils: Pupils are equal, round, and reactive to light.  Pulmonary:     Effort: Pulmonary effort is normal.  Abdominal:     Palpations: Abdomen is soft.  Musculoskeletal:        General: Normal range of motion.     Cervical back: Neck supple.  Skin:    General: Skin is warm.  Neurological:     Mental Status: He is alert and oriented to person, place, and time.  Psychiatric:        Behavior: Behavior normal.        Thought Content: Thought content normal.        Judgment: Judgment normal.     Ortho Exam Bilateral hips show significant pain with any attempted range of motion.  There is tenderness throughout the hip.  No significant lumbar spine tenderness.  Neurovascular intact distally. Specialty Comments:  No specialty comments available.  Imaging: XR HIPS BILAT W OR W/O PELVIS 3-4 VIEWS  Result Date: 10/29/2019 Status post bilateral hip total replacement.  Lucency above the left acetabular component.  The femoral stems  appear to be well fixed with pedestal effect    PMFS History: Patient Active Problem List   Diagnosis Date Noted  . Non-Obstructive CAD 07/31/2019  . Demand ischemia ([place]) 07/31/2019  . Dyslipidemia 07/31/2019  . Acute on chronic systolic CHF (congestive heart failure) ([place]) 07/29/2019  . Persistent atrial fibrillation ([place]) 07/29/2019  . Acute systolic CHF (congestive heart failure) ([place]) 07/29/2019  . Non-cardiac chest pain 11/08/2017  . Asthma with acute exacerbation 10/06/2014  . History of asthma 08/15/2014  . Hemoptysis 08/15/2014  . Chronic cough 08/15/2014  . Cardiomyopathy ([place]) 08/13/2014  . Asthma exacerbation 07/16/2014  . Pleuritic chest pain 07/16/2014  . Asthma, chronic 02/19/2014  . Personal history of colonic adenomas 02/20/2013  . Dyspnea 12/16/2011  . Chest pain with moderate risk for cardiac etiology 12/16/2011  . HIATAL HERNIA 07/11/2006  .  OSTEOARTHRITIS 07/11/2006  . ARTHRITIS, HIPS, BILATERAL 07/11/2006   Past Medical History:  Diagnosis Date  . Arthritis of hip    bilateral  . Asthma   . CAD (coronary artery disease)    LHC 5/16:  Dx ostial 30%, LAD luminal irregs, EF 40%  . Chronic atrial fibrillation ([place]) 07/29/2019  . Chronic systolic CHF (congestive heart failure) ([place])    a. Echo 3/16:  inf and inf-lat HK, mild LVH, EF 45%, mild to mod LAE, normal RVF  . Cough   . Hiatal hernia   . NICM (nonischemic cardiomyopathy) ([place])   . Osteoarthritis   . Personal history of colonic adenomas 02/20/2013  . Pneumonia     Family History  Problem Relation Age of Onset  . Dementia Mother   . Cancer Other   . Stroke Cousin   . Cancer Maternal Aunt   . Diabetes Cousin   . Colon cancer Neg Hx   . Esophageal cancer Neg Hx   . Heart attack Neg Hx   . Hypertension Neg Hx     Past Surgical History:  Procedure Laterality Date  . COLONOSCOPY  02/13/13  . LEFT AND RIGHT HEART CATHETERIZATION WITH CORONARY ANGIOGRAM N/A 08/18/2014   Procedure: LEFT AND RIGHT HEART CATHETERIZATION WITH CORONARY ANGIOGRAM;  Surgeon: [name], MD;  Location: [facility] CATH LAB;  Service: Cardiovascular;  Laterality: N/A;  . LEFT HEART CATH AND CORONARY ANGIOGRAPHY N/A 11/09/2017   Procedure: LEFT HEART CATH AND CORONARY ANGIOGRAPHY;  Surgeon: [name], MD;  Location: [facility] CV LAB;  Service: Cardiovascular;  Laterality: N/A;  . TOTAL HIP ARTHROPLASTY  2002   bilateral   Social History   Occupational History  . Occupation: Scientist, water quality: [employer]  Tobacco Use  . Smoking status: Former Smoker    Packs/day: 1.00    Years: 7.00    Pack years: 7.00    Types: Cigarettes    Quit date: 11/05/1997    Years since quitting: 21.9  . Smokeless tobacco: Never Used  Vaping Use  . Vaping Use: Never used  Substance and Sexual Activity  . Alcohol use: No    Alcohol/week: 0.0 standard drinks  .  Drug use: No  . Sexual activity: Not on file

## 2019-10-30 NOTE — Telephone Encounter (Signed)
Patient notified

## 2019-11-12 ENCOUNTER — Encounter: Payer: Self-pay | Admitting: Rehabilitative and Restorative Service Providers"

## 2019-11-12 ENCOUNTER — Ambulatory Visit (INDEPENDENT_AMBULATORY_CARE_PROVIDER_SITE_OTHER): Payer: No Typology Code available for payment source | Admitting: Rehabilitative and Restorative Service Providers"

## 2019-11-12 ENCOUNTER — Other Ambulatory Visit: Payer: Self-pay

## 2019-11-12 DIAGNOSIS — M25651 Stiffness of right hip, not elsewhere classified: Secondary | ICD-10-CM | POA: Diagnosis not present

## 2019-11-12 DIAGNOSIS — M25652 Stiffness of left hip, not elsewhere classified: Secondary | ICD-10-CM

## 2019-11-12 DIAGNOSIS — R262 Difficulty in walking, not elsewhere classified: Secondary | ICD-10-CM

## 2019-11-12 DIAGNOSIS — M25551 Pain in right hip: Secondary | ICD-10-CM

## 2019-11-12 DIAGNOSIS — M25552 Pain in left hip: Secondary | ICD-10-CM

## 2019-11-12 NOTE — Therapy (Signed)
[place], [place], [address] Phone: [phone]   Fax:  [phone]  Physical Therapy Evaluation  Patient Details  Name: Marvin Chandler MRN: [id number] Date of Birth: 1951-03-08 Referring Provider (PT): Leandrew Koyanagi MD   Encounter Date: 11/12/2019   PT End of Session - 11/12/19 1212    Visit Number 1    Number of Visits 15    Date for PT Re-Evaluation 02/07/20    Authorization Time Period Through 03/19/20    Authorization - Visit Number 1    Authorization - Number of Visits 15    Progress Note Due on Visit 15    PT Start Time 1100    PT Stop Time 1145    PT Time Calculation (min) 45 min    Equipment Utilized During Treatment Gait belt    Activity Tolerance Patient tolerated treatment well    Behavior During Therapy [facility] for tasks assessed/performed           Past Medical History:  Diagnosis Date  . Arthritis of hip    bilateral  . Asthma   . CAD (coronary artery disease)    LHC 5/16:  Dx ostial 30%, LAD luminal irregs, EF 40%  . Chronic atrial fibrillation ([place]) 07/29/2019  . Chronic systolic CHF (congestive heart failure) ([place])    a. Echo 3/16:  inf and inf-lat HK, mild LVH, EF 45%, mild to mod LAE, normal RVF  . Cough   . Hiatal hernia   . NICM (nonischemic cardiomyopathy) ([place])   . Osteoarthritis   . Personal history of colonic adenomas 02/20/2013  . Pneumonia     Past Surgical History:  Procedure Laterality Date  . COLONOSCOPY  02/13/13  . LEFT AND RIGHT HEART CATHETERIZATION WITH CORONARY ANGIOGRAM N/A 08/18/2014   Procedure: LEFT AND RIGHT HEART CATHETERIZATION WITH CORONARY ANGIOGRAM;  Surgeon: [name], MD;  [facility] C[facility] LAB;  Service: Cardiovascular;  Laterality: N/A;  . LEFT HEART CATH AND CORONARY ANGIOGRAPHY N/A 11/09/2017   Procedure: LEFT HEART CATH AND CORONARY ANGIOGRAPHY;  Surgeon: [name], MD;  Location: [place] CV LAB;  Service: Cardiovascular;   Laterality: N/A;  . TOTAL HIP ARTHROPLASTY  2002   bilateral    There were no vitals filed for this visit.    Subjective Assessment - 11/12/19 1135    Subjective Marvin Chandler reports L > R gluteal/hip pain of several months duration.  Symptoms have been particularly limiting over the past 3 months and limit walking to a few yards at a time.  He has a history of B hip THAs in 2002.    Pertinent History Previous B THAs in 2002    Limitations Walking;Other (comment)   Sleeping   How long can you sit comfortably? 20-25 minutes    How long can you stand comfortably? OK    How long can you walk comfortably? 10-15 yards    Patient Stated Goals Walk better    Currently in Pain? Yes    Pain Score 7     Pain Location Other (Comment)   B gluteals L > R and starting in the groin.   Pain Orientation Left    Pain Descriptors / Indicators Sore;Tender    Pain Type Chronic pain    Pain Radiating Towards Groin    Pain Onset More than a month ago    Pain Frequency Other (Comment)   With walking   Aggravating Factors  Walking    Pain Relieving  Factors Rest    Effect of Pain on Daily Activities Severely limited walking endurance and quality              [facility] PT Assessment - 11/12/19 0001      Assessment   Medical Diagnosis Gluteal tendinopathy    Referring Provider (PT) Leandrew Koyanagi MD    Onset Date/Surgical Date 06/15/19      Balance Screen   Has the patient fallen in the past 6 months No    Has the patient had a decrease in activity level because of a fear of falling?  No    Is the patient reluctant to leave their home because of a fear of falling?  No      Cognition   Overall Cognitive Status Within Functional Limits for tasks assessed      ROM / Strength   AROM / PROM / Strength AROM;Strength      AROM   Overall AROM  Deficits    AROM Assessment Site Lumbar;Hip    Right/Left Hip Right;Left    Right Hip Flexion 80    Left Hip Flexion 50    Lumbar Extension < 5 degrees       Strength   Overall Strength Deficits    Overall Strength Comments Very stiff and weak globally    Strength Assessment Site Hip    Right/Left Hip Left;Right    Right Hip ABduction 2/5    Left Hip ABduction 2/5      Flexibility   Soft Tissue Assessment /Muscle Length yes    Hamstrings L side 25 degrees/R side 30 degrees      Ambulation/Gait   Ambulation/Gait Yes    Ambulation/Gait Assistance 7: Independent    Ambulation Distance (Feet) 20 Feet    Assistive device None    Gait Pattern --   Significant hip abductors weakness noted   Gait Comments Limited to 10-15 yards due to hip/gluteal pain                      Objective measurements completed on examination: See above findings.       [place] Adult PT Treatment/Exercise - 11/12/19 0001      Neuro Re-ed    Neuro Re-ed Details  Wider heel to toe balance for hip abductors challenge 4X each foot in front 20 seconds      Exercises   Exercises Knee/Hip      Knee/Hip Exercises: Stretches   Other Knee/Hip Stretches Pelvic rotation (knees bent and rotate side to side) 10X 10 seconds      Knee/Hip Exercises: Standing   Other Standing Knee Exercises Standing trunk extension AROM 10X 3 seconds      Knee/Hip Exercises: Supine   Bridges Strengthening;5 reps   3 seconds   Other Supine Knee/Hip Exercises Belt push/Pillow squeeze (Hip aBduction/aDduction isometrics) 10X each 5 seconds                  PT Education - 11/12/19 1211    Education Details Prescribed and reviewed an appropriate HEP.    Person(s) Educated Patient    Methods Explanation;Demonstration;Verbal cues;Handout    Comprehension Verbal cues required;Need further instruction;Returned demonstration;Verbalized understanding               PT Long Term Goals - 11/12/19 1218      PT LONG TERM GOAL #1   Title Marvin Chandler will report B hip pain consistently 0-2/10 on the Numeric Pain Rating Scale.  Baseline 7/10    Time 12    Period Weeks     Status New    Target Date 02/07/20      PT LONG TERM GOAL #2   Title Marvin Chandler will be able to walk a mile or more without increasing hip pain at DC.    Baseline 10-15 yards    Time 12    Period Weeks    Status New    Target Date 02/07/20      PT LONG TERM GOAL #3   Title Improve B hip flexors flexibility to 100 degrees and hamstrings to 45 degrees.    Baseline 50 and 80    Time 12    Period Weeks    Status New    Target Date 02/07/20      PT LONG TERM GOAL #4   Title Marvin Chandler will improve global B hip strength to 4+/5 MMT or better by DC.    Baseline 2/5    Time 12    Period Weeks    Status New    Target Date 02/07/20      PT LONG TERM GOAL #5   Title Marvin Chandler will be independent and compliant with his final HEP at DC.    Time 12    Period Weeks    Status New    Target Date 02/07/20                  Plan - 11/12/19 1213    Clinical Impression Statement Marvin Chandler reports L > R gluteal/hip pain of several months duration.  Symptoms have been particularly limiting over the past 3 months and limit walking to a few yards at a time.  He has a history of B hip THAs in 2002.  He will need general LE strength and flexibility work with emphasis on improving hip abductors strength work.    Personal Factors and Comorbidities Comorbidity 1    Comorbidities B THAs in 2002    Examination-Activity Limitations Stairs;Bed Mobility;Dressing;Sleep;Locomotion Level    Stability/Clinical Decision Making Stable/Uncomplicated    Clinical Decision Making Low    Rehab Potential Good    PT Frequency Other (comment)   1-2X/week   PT Duration 12 weeks    PT Treatment/Interventions ADLs/Self Care Home Management;Stair training;Gait training;Functional mobility training;Therapeutic activities;Therapeutic exercise;Neuromuscular re-education;Patient/family education;Balance training    PT Next Visit Plan Progress LE strength and flexibility work with emphasis on hip abductors strengthening.    PT Home  Exercise Plan Access Code: [email or web address]    Consulted and Agree with Plan of Care Patient           Patient will benefit from skilled therapeutic intervention in order to improve the following deficits and impairments:  Abnormal gait, Decreased activity tolerance, Decreased endurance, Decreased range of motion, Decreased mobility, Decreased strength, Difficulty walking, Hypomobility, Impaired flexibility, Pain  Visit Diagnosis: Difficulty in walking, not elsewhere classified  Stiffness of left hip, not elsewhere classified  Stiffness of right hip, not elsewhere classified  Pain in left hip  Pain in right hip     Problem List Patient Active Problem List   Diagnosis Date Noted  . Non-Obstructive CAD 07/31/2019  . Demand ischemia ([place]) 07/31/2019  . Dyslipidemia 07/31/2019  . Acute on chronic systolic CHF (congestive heart failure) ([place]) 07/29/2019  . Persistent atrial fibrillation ([place]) 07/29/2019  . Acute systolic CHF (congestive heart failure) ([place]) 07/29/2019  . Non-cardiac chest pain 11/08/2017  . Asthma with acute exacerbation 10/06/2014  . History of  asthma 08/15/2014  . Hemoptysis 08/15/2014  . Chronic cough 08/15/2014  . Cardiomyopathy ([place]) 08/13/2014  . Asthma exacerbation 07/16/2014  . Pleuritic chest pain 07/16/2014  . Asthma, chronic 02/19/2014  . Personal history of colonic adenomas 02/20/2013  . Dyspnea 12/16/2011  . Chest pain with moderate risk for cardiac etiology 12/16/2011  . HIATAL HERNIA 07/11/2006  . OSTEOARTHRITIS 07/11/2006  . ARTHRITIS, HIPS, BILATERAL 07/11/2006    Farley Ly PT, MPT 11/12/2019, 12:24 PM  [facility] [address] [place], [place], [address] Phone: [phone]   Fax:  [phone]  Name: Marvin Chandler MRN: [id number] Date of Birth: May 16, 1950

## 2019-11-12 NOTE — Patient Instructions (Signed)
Access Code: [email or web address] URL: [email or web address] Date: 11/12/2019 Prepared by: Vista Mink  Exercises Supine Bridge - 2 x daily - 7 x weekly - 2 sets - 5 reps - 3 second hold Standing Lumbar Extension at [place]is - 5 x daily - 7 x weekly - 1 sets - 5 reps - 3 hold Standing Tandem Balance with Counter Support - 2 x daily - 7 x weekly - 1 sets - 4-5 reps - 20 seconds hold Pelvic rotation (knees bent/side to side) 20X 10 seconds 2-3X/day Belt push (hip abduction isometrics)/Pillow squeeze (hip adduction isometrics) 10X each 5 seconds to comfort 50/day

## 2019-11-20 ENCOUNTER — Ambulatory Visit (INDEPENDENT_AMBULATORY_CARE_PROVIDER_SITE_OTHER): Payer: No Typology Code available for payment source | Admitting: Rehabilitative and Restorative Service Providers"

## 2019-11-20 ENCOUNTER — Other Ambulatory Visit: Payer: Self-pay

## 2019-11-20 DIAGNOSIS — M25552 Pain in left hip: Secondary | ICD-10-CM | POA: Diagnosis not present

## 2019-11-20 DIAGNOSIS — M25652 Stiffness of left hip, not elsewhere classified: Secondary | ICD-10-CM | POA: Diagnosis not present

## 2019-11-20 DIAGNOSIS — R262 Difficulty in walking, not elsewhere classified: Secondary | ICD-10-CM

## 2019-11-20 DIAGNOSIS — M25551 Pain in right hip: Secondary | ICD-10-CM

## 2019-11-20 DIAGNOSIS — M25651 Stiffness of right hip, not elsewhere classified: Secondary | ICD-10-CM | POA: Diagnosis not present

## 2019-11-20 NOTE — Therapy (Signed)
[place], [place], [address] Phone: [phone]   Fax:  [phone]  Physical Therapy Treatment  Patient Details  Name: Marvin Chandler MRN: [id number] Date of Birth: 1950-10-12 Referring Provider (PT): Leandrew Koyanagi MD   Encounter Date: 11/20/2019   PT End of Session - 11/20/19 1436    Visit Number 2    Number of Visits 15    Date for PT Re-Evaluation 02/07/20    Authorization Time Period Through 03/19/20    Authorization - Visit Number 1    Authorization - Number of Visits 15    Progress Note Due on Visit 10    PT Start Time 7169    PT Stop Time 1515    PT Time Calculation (min) 39 min    Activity Tolerance Patient tolerated treatment well    Behavior During Therapy [facility] for tasks assessed/performed           Past Medical History:  Diagnosis Date  . Arthritis of hip    bilateral  . Asthma   . CAD (coronary artery disease)    LHC 5/16:  Dx ostial 30%, LAD luminal irregs, EF 40%  . Chronic atrial fibrillation ([place]) 07/29/2019  . Chronic systolic CHF (congestive heart failure) ([place])    a. Echo 3/16:  inf and inf-lat HK, mild LVH, EF 45%, mild to mod LAE, normal RVF  . Cough   . Hiatal hernia   . NICM (nonischemic cardiomyopathy) ([place])   . Osteoarthritis   . Personal history of colonic adenomas 02/20/2013  . Pneumonia     Past Surgical History:  Procedure Laterality Date  . COLONOSCOPY  02/13/13  . LEFT AND RIGHT HEART CATHETERIZATION WITH CORONARY ANGIOGRAM N/A 08/18/2014   Procedure: LEFT AND RIGHT HEART CATHETERIZATION WITH CORONARY ANGIOGRAM;  Surgeon: [name], MD;  Location: [facility] CATH LAB;  Service: Cardiovascular;  Laterality: N/A;  . LEFT HEART CATH AND CORONARY ANGIOGRAPHY N/A 11/09/2017   Procedure: LEFT HEART CATH AND CORONARY ANGIOGRAPHY;  Surgeon: [name], MD;  Location: [place] CV LAB;  Service: Cardiovascular;  Laterality: N/A;  . TOTAL HIP ARTHROPLASTY  2002    bilateral    There were no vitals filed for this visit.   Subjective Assessment - 11/20/19 1438    Subjective Pt. indicated feeling improvement in symptoms and movement since last visit.  Pt. stated still having some complaints of pain.    Pertinent History Previous B THAs in 2002    Limitations Walking;Other (comment)   Sleeping   How long can you sit comfortably? 20-25 minutes    How long can you stand comfortably? OK    How long can you walk comfortably? 10-15 yards    Patient Stated Goals Walk better    Currently in Pain? Yes    Pain Score 7     Pain Location Hip    Pain Orientation Left;Right    Pain Type Chronic pain    Pain Onset More than a month ago    Aggravating Factors  walking/standing    Pain Relieving Factors rest    Effect of Pain on Daily Activities walking limitations                             [facility] Adult PT Treatment/Exercise - 11/20/19 0001      Neuro Re-ed    Neuro Re-ed Details  retro step 20 x bilateral, modified tandem 30  sec x 4 bilateral      Knee/Hip Exercises: Stretches   Other Knee/Hip Stretches supine trunk rotation 15 sec x 5 bilateral      Knee/Hip Exercises: Aerobic   Nustep Lvl 5 10 mins UE/LE      Knee/Hip Exercises: Seated   Sit to Sand without UE support;10 reps      Knee/Hip Exercises: Supine   Writer;Both   green band hip abd hold 2 x 10   Other Supine Knee/Hip Exercises supine clam shell green 2 x 10 bilateral                       PT Long Term Goals - 11/12/19 1218      PT LONG TERM GOAL #1   Title [name] will report B hip pain consistently 0-2/10 on the Numeric Pain Rating Scale.    Baseline 7/10    Time 12    Period Weeks    Status New    Target Date 02/07/20      PT LONG TERM GOAL #2   Title [name] will be able to walk a mile or more without increasing hip pain at DC.    Baseline 10-15 yards    Time 12    Period Weeks    Status New    Target Date 02/07/20      PT  LONG TERM GOAL #3   Title Improve B hip flexors flexibility to 100 degrees and hamstrings to 45 degrees.    Baseline 50 and 80    Time 12    Period Weeks    Status New    Target Date 02/07/20      PT LONG TERM GOAL #4   Title [name] will improve global B hip strength to 4+/5 MMT or better by DC.    Baseline 2/5    Time 12    Period Weeks    Status New    Target Date 02/07/20      PT LONG TERM GOAL #5   Title [name] will be independent and compliant with his final HEP at DC.    Time 12    Period Weeks    Status New    Target Date 02/07/20                 Plan - 11/20/19 1512    Clinical Impression Statement Per Pt. report as well as visual observance, Pt. demonstrated marked improvement in ambulation c distance increased.  Independent ambulation today household distances c mild to moderate reduction of stance on Lt LE.   Good tolerance to intervention today.  Continued skilled PT services indicated at this time.    Personal Factors and Comorbidities Comorbidity 1    Comorbidities B THAs in 2002    Examination-Activity Limitations Stairs;Bed Mobility;Dressing;Sleep;Locomotion Level    Stability/Clinical Decision Making Stable/Uncomplicated    Rehab Potential Good    PT Frequency Other (comment)   1-2X/week   PT Duration 12 weeks    PT Treatment/Interventions ADLs/Self Care Home Management;Stair training;Gait training;Functional mobility training;Therapeutic activities;Therapeutic exercise;Neuromuscular re-education;Patient/family education;Balance training    PT Next Visit Plan Continue to improve strength, mobility and movement coordination.    PT Home Exercise Plan Access Code: [email or web address]    [email or web address] and Agree with Plan of Care Patient           Patient will benefit from skilled therapeutic intervention in order to improve the following deficits and impairments:  Abnormal gait, Decreased activity  tolerance, Decreased endurance, Decreased range of motion, Decreased  mobility, Decreased strength, Difficulty walking, Hypomobility, Impaired flexibility, Pain  Visit Diagnosis: Pain in left hip  Pain in right hip  Stiffness of left hip, not elsewhere classified  Stiffness of right hip, not elsewhere classified  Difficulty in walking, not elsewhere classified     Problem List Patient Active Problem List   Diagnosis Date Noted  . Non-Obstructive CAD 07/31/2019  . Demand ischemia ([place]) 07/31/2019  . Dyslipidemia 07/31/2019  . Acute on chronic systolic CHF (congestive heart failure) ([place]) 07/29/2019  . Persistent atrial fibrillation ([place]) 07/29/2019  . Acute systolic CHF (congestive heart failure) ([place]) 07/29/2019  . Non-cardiac chest pain 11/08/2017  . Asthma with acute exacerbation 10/06/2014  . History of asthma 08/15/2014  . Hemoptysis 08/15/2014  . Chronic cough 08/15/2014  . Cardiomyopathy ([place]) 08/13/2014  . Asthma exacerbation 07/16/2014  . Pleuritic chest pain 07/16/2014  . Asthma, chronic 02/19/2014  . Personal history of colonic adenomas 02/20/2013  . Dyspnea 12/16/2011  . Chest pain with moderate risk for cardiac etiology 12/16/2011  . HIATAL HERNIA 07/11/2006  . OSTEOARTHRITIS 07/11/2006  . ARTHRITIS, HIPS, BILATERAL 07/11/2006   Scot Jun, PT, DPT, OCS, ATC 11/20/19  3:15 PM    [facility] [address] [place], [place], [address] Phone: [phone]   Fax:  [phone]  Name: Marvin Chandler MRN: [id number] Date of Birth: 06-23-1950

## 2019-11-29 ENCOUNTER — Ambulatory Visit (INDEPENDENT_AMBULATORY_CARE_PROVIDER_SITE_OTHER): Payer: No Typology Code available for payment source | Admitting: Physical Therapy

## 2019-11-29 ENCOUNTER — Encounter: Payer: Self-pay | Admitting: Physical Therapy

## 2019-11-29 ENCOUNTER — Other Ambulatory Visit: Payer: Self-pay

## 2019-11-29 DIAGNOSIS — M25652 Stiffness of left hip, not elsewhere classified: Secondary | ICD-10-CM

## 2019-11-29 DIAGNOSIS — M25552 Pain in left hip: Secondary | ICD-10-CM

## 2019-11-29 DIAGNOSIS — M25651 Stiffness of right hip, not elsewhere classified: Secondary | ICD-10-CM | POA: Diagnosis not present

## 2019-11-29 DIAGNOSIS — M25551 Pain in right hip: Secondary | ICD-10-CM

## 2019-11-29 DIAGNOSIS — R262 Difficulty in walking, not elsewhere classified: Secondary | ICD-10-CM

## 2019-11-29 NOTE — Therapy (Signed)
[place], [place], [address] Phone: [phone]   Fax:  [phone]  Physical Therapy Treatment  Patient Details  Name: Marvin Chandler MRN: [id number] Date of Birth: 06-06-1950 Referring Provider (PT): Marvin Koyanagi MD   Encounter Date: 11/29/2019   PT End of Session - 11/29/19 0939    Visit Number 3    Number of Visits 15    Date for PT Re-Evaluation 02/07/20    Authorization Time Period Through 03/19/20    Authorization - Visit Number 2    Authorization - Number of Visits 15    Progress Note Due on Visit 10    PT Start Time 0900    PT Stop Time 0943    PT Time Calculation (min) 43 min    Equipment Utilized During Treatment Gait belt    Activity Tolerance Patient tolerated treatment well    Behavior During Therapy [facility] for tasks assessed/performed           Past Medical History:  Diagnosis Date  . Arthritis of hip    bilateral  . Asthma   . CAD (coronary artery disease)    LHC 5/16:  Dx ostial 30%, LAD luminal irregs, EF 40%  . Chronic atrial fibrillation ([place]) 07/29/2019  . Chronic systolic CHF (congestive heart failure) ([place])    a. Echo 3/16:  inf and inf-lat HK, mild LVH, EF 45%, mild to mod LAE, normal RVF  . Cough   . Hiatal hernia   . NICM (nonischemic cardiomyopathy) ([place])   . Osteoarthritis   . Personal history of colonic adenomas 02/20/2013  . Pneumonia     Past Surgical History:  Procedure Laterality Date  . COLONOSCOPY  02/13/13  . LEFT AND RIGHT HEART CATHETERIZATION WITH CORONARY ANGIOGRAM N/A 08/18/2014   Procedure: LEFT AND RIGHT HEART CATHETERIZATION WITH CORONARY ANGIOGRAM;  Surgeon: Marvin Booze, MD;  Location: [facility] CATH LAB;  Service: Cardiovascular;  Laterality: N/A;  . LEFT HEART CATH AND CORONARY ANGIOGRAPHY N/A 11/09/2017   Procedure: LEFT HEART CATH AND CORONARY ANGIOGRAPHY;  Surgeon: Marvin Harp, MD;  Location: [place] CV LAB;  Service: Cardiovascular;   Laterality: N/A;  . TOTAL HIP ARTHROPLASTY  2002   bilateral    There were no vitals filed for this visit.   Subjective Assessment - 11/29/19 0937    Subjective Pt arriving today reporting 6/10 pain. Pt stating, "I have improved since strating therapy, before I couldn't even walk up the stairs".    Pertinent History Previous B THAs in 2002    Limitations Walking;Other (comment)    How long can you sit comfortably? 20-25 minutes    How long can you stand comfortably? OK    How long can you walk comfortably? 10-15 yards    Patient Stated Goals Walk better    Currently in Pain? Yes    Pain Score 6     Pain Location Hip    Pain Orientation Left    Pain Descriptors / Indicators Aching;Tightness;Sore    Pain Type Chronic pain    Pain Onset More than a month ago    Pain Frequency Constant                             [facility] Adult PT Treatment/Exercise - 11/29/19 0001      Exercises   Exercises Knee/Hip      Knee/Hip Exercises: Stretches   Piriformis Stretch Both;2 reps;30  seconds;Limitations    Piriformis Stretch Limitations modified position    Gastroc Stretch Limitations slant board x 2 holding 30 seconds    Other Knee/Hip Stretches supine trunk rotation 15 sec x 5 bilateral      Knee/Hip Exercises: Aerobic   Recumbent Bike L3 x 8 minutes      Knee/Hip Exercises: Seated   Other Seated Knee/Hip Exercises controlled hip flexion with cues for abdominal activation x 30     Other Seated Knee/Hip Exercises trunk flexion and extension using green physioball pushing foward and back x 10    Sit to Sand 10 reps;without UE support      Knee/Hip Exercises: Supine   Heel Slides AROM;Left;2 sets;5 reps    [employer];Both   green band hip abd hold 2 x 10   Straight Leg Raises Limitations attempted but pt unable to perform due to pain    Other Supine Knee/Hip Exercises supine clam shell green 2 x 10 bilateral    Other Supine Knee/Hip Exercises hamstring sets x  10 holding 5 seconds      Knee/Hip Exercises: Sidelying   Clams 2 x 10, increased pain noted in L hip    Other Sidelying Knee/Hip Exercises reverse clams 2 x10 each LE, no pain reported                  PT Education - 11/29/19 0938    Education Details sit to stand technique to keep from posterior leaning    Person(s) Educated Patient    Methods Explanation;Demonstration    Comprehension Verbalized understanding;Returned demonstration               PT Long Term Goals - 11/12/19 1218      PT LONG TERM GOAL #1   Title Marvin Chandler will report B hip pain consistently 0-2/10 on the Numeric Pain Rating Scale.    Baseline 7/10    Time 12    Period Weeks    Status New    Target Date 02/07/20      PT LONG TERM GOAL #2   Title Marvin Chandler will be able to walk a mile or more without increasing hip pain at DC.    Baseline 10-15 yards    Time 12    Period Weeks    Status New    Target Date 02/07/20      PT LONG TERM GOAL #3   Title Improve B hip flexors flexibility to 100 degrees and hamstrings to 45 degrees.    Baseline 50 and 80    Time 12    Period Weeks    Status New    Target Date 02/07/20      PT LONG TERM GOAL #4   Title Marvin Chandler will improve global B hip strength to 4+/5 MMT or better by DC.    Baseline 2/5    Time 12    Period Weeks    Status New    Target Date 02/07/20      PT LONG TERM GOAL #5   Title Marvin Chandler will be independent and compliant with his final HEP at DC.    Time 12    Period Weeks    Status New    Target Date 02/07/20                 Plan - 11/29/19 0942    Clinical Impression Statement Pt reporting improvements in L hip. Reporting 6/10 pain. Pt tolerating exericses with reports of increased pain during hip  abduction. Progressing with sit to stand techniques to provent posterior lean. Continue skilled PT progressing toward goals set.    Personal Factors and Comorbidities Comorbidity 1    Comorbidities B THAs in 2002     Examination-Activity Limitations Stairs;Bed Mobility;Dressing;Sleep;Locomotion Level    Stability/Clinical Decision Making Stable/Uncomplicated    Rehab Potential Good    PT Frequency Other (comment)    PT Duration 12 weeks    PT Treatment/Interventions ADLs/Self Care Home Management;Stair training;Gait training;Functional mobility training;Therapeutic activities;Therapeutic exercise;Neuromuscular re-education;Patient/family education;Balance training    PT Next Visit Plan Continue to improve strength, mobility and movement coordination.    PT Home Exercise Plan Access Code: [email or web address]    Consulted and Agree with Plan of Care Patient           Patient will benefit from skilled therapeutic intervention in order to improve the following deficits and impairments:  Abnormal gait, Decreased activity tolerance, Decreased endurance, Decreased range of motion, Decreased mobility, Decreased strength, Difficulty walking, Hypomobility, Impaired flexibility, Pain  Visit Diagnosis: Pain in left hip  Pain in right hip  Stiffness of left hip, not elsewhere classified  Stiffness of right hip, not elsewhere classified  Difficulty in walking, not elsewhere classified     Problem List Patient Active Problem List   Diagnosis Date Noted  . Non-Obstructive CAD 07/31/2019  . Demand ischemia ([place]) 07/31/2019  . Dyslipidemia 07/31/2019  . Acute on chronic systolic CHF (congestive heart failure) ([place]) 07/29/2019  . Persistent atrial fibrillation ([place]) 07/29/2019  . Acute systolic CHF (congestive heart failure) ([place]) 07/29/2019  . Non-cardiac chest pain 11/08/2017  . Asthma with acute exacerbation 10/06/2014  . History of asthma 08/15/2014  . Hemoptysis 08/15/2014  . Chronic cough 08/15/2014  . Cardiomyopathy ([place]) 08/13/2014  . Asthma exacerbation 07/16/2014  . Pleuritic chest pain 07/16/2014  . Asthma, chronic 02/19/2014  . Personal history of colonic adenomas 02/20/2013  . Dyspnea 12/16/2011   . Chest pain with moderate risk for cardiac etiology 12/16/2011  . HIATAL HERNIA 07/11/2006  . OSTEOARTHRITIS 07/11/2006  . ARTHRITIS, HIPS, BILATERAL 07/11/2006    Oretha Caprice, PT, MPT 11/29/2019, 9:44 AM  [facility] [address] [place], [place], [address] Phone: [phone]   Fax:  [phone]  Name: Marvin Chandler MRN: [id number] Date of Birth: Oct 25, 1950

## 2019-12-04 ENCOUNTER — Other Ambulatory Visit: Payer: Self-pay

## 2019-12-04 ENCOUNTER — Ambulatory Visit (INDEPENDENT_AMBULATORY_CARE_PROVIDER_SITE_OTHER): Payer: No Typology Code available for payment source | Admitting: Rehabilitative and Restorative Service Providers"

## 2019-12-04 ENCOUNTER — Encounter: Payer: Self-pay | Admitting: Rehabilitative and Restorative Service Providers"

## 2019-12-04 DIAGNOSIS — R262 Difficulty in walking, not elsewhere classified: Secondary | ICD-10-CM

## 2019-12-04 DIAGNOSIS — M25551 Pain in right hip: Secondary | ICD-10-CM | POA: Diagnosis not present

## 2019-12-04 DIAGNOSIS — M25552 Pain in left hip: Secondary | ICD-10-CM

## 2019-12-04 DIAGNOSIS — M25652 Stiffness of left hip, not elsewhere classified: Secondary | ICD-10-CM | POA: Diagnosis not present

## 2019-12-04 DIAGNOSIS — M25651 Stiffness of right hip, not elsewhere classified: Secondary | ICD-10-CM

## 2019-12-04 NOTE — Therapy (Signed)
[place], [place], [address] Phone: [phone]   Fax:  [phone]  Physical Therapy Treatment  Patient Details  Name: Marvin Chandler MRN: [id number] Date of Birth: 12-19-1950 Referring Provider (PT): Marvin Koyanagi MD   Encounter Date: 12/04/2019   PT End of Session - 12/04/19 1112    Visit Number 4    Number of Visits 15    Date for PT Re-Evaluation 02/07/20    Authorization Time Period Through 03/19/20    Authorization - Visit Number 4    Authorization - Number of Visits 15    Progress Note Due on Visit 10    PT Start Time 1108    PT Stop Time 1146    PT Time Calculation (min) 38 min    Activity Tolerance Patient tolerated treatment well    Behavior During Therapy Marvin Chandler for tasks assessed/performed           Past Medical History:  Diagnosis Date  . Arthritis of hip    bilateral  . Asthma   . CAD (coronary artery disease)    LHC 5/16:  Dx ostial 30%, LAD luminal irregs, EF 40%  . Chronic atrial fibrillation ([place]) 07/29/2019  . Chronic systolic CHF (congestive heart failure) ([place])    a. Echo 3/16:  inf and inf-lat HK, mild LVH, EF 45%, mild to mod LAE, normal RVF  . Cough   . Hiatal hernia   . NICM (nonischemic cardiomyopathy) ([place])   . Osteoarthritis   . Personal history of colonic adenomas 02/20/2013  . Pneumonia     Past Surgical History:  Procedure Laterality Date  . COLONOSCOPY  02/13/13  . LEFT AND RIGHT HEART CATHETERIZATION WITH CORONARY ANGIOGRAM N/A 08/18/2014   Procedure: LEFT AND RIGHT HEART CATHETERIZATION WITH CORONARY ANGIOGRAM;  Surgeon: Marvin Booze, MD;  [facility] CATH LAB;  Service: Cardiovascular;  Laterality: N/A;  . LEFT HEART CATH AND CORONARY ANGIOGRAPHY N/A 11/09/2017   Procedure: LEFT HEART CATH AND CORONARY ANGIOGRAPHY;  Surgeon: Marvin Harp, MD;  Location: [place] CV LAB;  Service: Cardiovascular;  Laterality: N/A;  . TOTAL HIP ARTHROPLASTY  2002    bilateral    There were no vitals filed for this visit.   Subjective Assessment - 12/04/19 1111    Subjective Pt. indicated a bit better since last visit.  Pt. stated pain up to 5/10 at times but overall doing better.    Pertinent History Previous B THAs in 2002    Limitations Walking;Other (comment)    How long can you sit comfortably? 20-25 minutes    How long can you stand comfortably? OK    How long can you walk comfortably? 10-15 yards    Patient Stated Goals Walk better    Currently in Pain? Yes    Pain Score 5    at worst   Pain Location Hip    Pain Orientation Left    Pain Descriptors / Indicators Aching;Tightness    Pain Type Chronic pain    Pain Onset More than a month ago    Pain Frequency Intermittent    Aggravating Factors  walking/standing, sometimes insidious    Pain Relieving Factors rest, physical therapy    Effect of Pain on Daily Activities walking limits                             [facility] Adult PT Treatment/Exercise - 12/04/19 0001  Neuro Re-ed    Neuro Re-ed Details  tandem ambulation in // bars on foam 8 ft x 10 fwd, 8 ft x 10 side to side on foam      Knee/Hip Exercises: Aerobic   Nustep Lvl 6 10 mins      Knee/Hip Exercises: Standing   Lateral Step Up 2 sets;10 reps;Step Height: 4";Both    Other Standing Knee Exercises standing isometric hip abd hold 5 sec x 10 bilateral      Knee/Hip Exercises: Seated   Other Seated Knee/Hip Exercises seated slr 2 x 10 bilateral    Other Seated Knee/Hip Exercises seated c    Sit to Sand 20 reps;without UE support                       PT Long Term Goals - 11/12/19 1218      PT LONG TERM GOAL #1   Title Marvin Chandler will report B hip pain consistently 0-2/10 on the Numeric Pain Rating Scale.    Baseline 7/10    Time 12    Period Weeks    Status New    Target Date 02/07/20      PT LONG TERM GOAL #2   Title Marvin Chandler will be able to walk a mile or more without increasing hip pain  at DC.    Baseline 10-15 yards    Time 12    Period Weeks    Status New    Target Date 02/07/20      PT LONG TERM GOAL #3   Title Improve B hip flexors flexibility to 100 degrees and hamstrings to 45 degrees.    Baseline 50 and 80    Time 12    Period Weeks    Status New    Target Date 02/07/20      PT LONG TERM GOAL #4   Title Marvin Chandler will improve global B hip strength to 4+/5 MMT or better by DC.    Baseline 2/5    Time 12    Period Weeks    Status New    Target Date 02/07/20      PT LONG TERM GOAL #5   Title Marvin Chandler will be independent and compliant with his final HEP at DC.    Time 12    Period Weeks    Status New    Target Date 02/07/20                 Plan - 12/04/19 1135    Clinical Impression Statement Antalgic gait deviations still noted on Lt LE at this time but improving in gait speed and WB acceptance. Continued strengthening and balance indicated c skilled PT    Personal Factors and Comorbidities Comorbidity 1    Comorbidities B THAs in 2002    Examination-Activity Limitations Stairs;Bed Mobility;Dressing;Sleep;Locomotion Level    Stability/Clinical Decision Making Stable/Uncomplicated    Rehab Potential Good    PT Frequency Other (comment)    PT Duration 12 weeks    PT Treatment/Interventions ADLs/Self Care Home Management;Stair training;Gait training;Functional mobility training;Therapeutic activities;Therapeutic exercise;Neuromuscular re-education;Patient/family education;Balance training    PT Next Visit Plan Complaint surface balance intervention, hip strengthening.    PT Home Exercise Plan Access Code: [email or web address]    [email or web address] and Agree with Plan of Care Patient           Patient will benefit from skilled therapeutic intervention in order to improve the following deficits and impairments:  Abnormal gait, Decreased activity tolerance,  Decreased endurance, Decreased range of motion, Decreased mobility, Decreased strength, Difficulty walking,  Hypomobility, Impaired flexibility, Pain  Visit Diagnosis: Pain in left hip  Pain in right hip  Stiffness of left hip, not elsewhere classified  Stiffness of right hip, not elsewhere classified  Difficulty in walking, not elsewhere classified     Problem List Patient Active Problem List   Diagnosis Date Noted  . Non-Obstructive CAD 07/31/2019  . Demand ischemia ([place]) 07/31/2019  . Dyslipidemia 07/31/2019  . Acute on chronic systolic CHF (congestive heart failure) ([place]) 07/29/2019  . Persistent atrial fibrillation ([place]) 07/29/2019  . Acute systolic CHF (congestive heart failure) ([place]) 07/29/2019  . Non-cardiac chest pain 11/08/2017  . Asthma with acute exacerbation 10/06/2014  . History of asthma 08/15/2014  . Hemoptysis 08/15/2014  . Chronic cough 08/15/2014  . Cardiomyopathy ([place]) 08/13/2014  . Asthma exacerbation 07/16/2014  . Pleuritic chest pain 07/16/2014  . Asthma, chronic 02/19/2014  . Personal history of colonic adenomas 02/20/2013  . Dyspnea 12/16/2011  . Chest pain with moderate risk for cardiac etiology 12/16/2011  . HIATAL HERNIA 07/11/2006  . OSTEOARTHRITIS 07/11/2006  . ARTHRITIS, HIPS, BILATERAL 07/11/2006    Scot Jun, PT, DPT, OCS, ATC 12/04/19  11:38 AM    [facility] [address] [place], [place], [address] Phone: [phone]   Fax:  [phone]  Name: Marvin Chandler MRN: [id number] Date of Birth: Nov 20, 1950

## 2019-12-11 ENCOUNTER — Encounter: Payer: No Typology Code available for payment source | Admitting: Rehabilitative and Restorative Service Providers"

## 2019-12-12 ENCOUNTER — Ambulatory Visit (INDEPENDENT_AMBULATORY_CARE_PROVIDER_SITE_OTHER): Payer: No Typology Code available for payment source | Admitting: Physical Therapy

## 2019-12-12 ENCOUNTER — Other Ambulatory Visit: Payer: Self-pay

## 2019-12-12 DIAGNOSIS — R262 Difficulty in walking, not elsewhere classified: Secondary | ICD-10-CM

## 2019-12-12 DIAGNOSIS — M25552 Pain in left hip: Secondary | ICD-10-CM | POA: Diagnosis not present

## 2019-12-12 DIAGNOSIS — M25652 Stiffness of left hip, not elsewhere classified: Secondary | ICD-10-CM

## 2019-12-12 DIAGNOSIS — M25651 Stiffness of right hip, not elsewhere classified: Secondary | ICD-10-CM | POA: Diagnosis not present

## 2019-12-12 DIAGNOSIS — M25551 Pain in right hip: Secondary | ICD-10-CM

## 2019-12-12 NOTE — Therapy (Signed)
[place], [place], [address] Phone: [phone]   Fax:  [phone]  Physical Therapy Treatment  Patient Details  Name: Marvin Chandler MRN: [id number] Date of Birth: [birth year] Referring Provider (PT): Leandrew Koyanagi MD   Encounter Date: 12/12/2019   PT End of Session - 12/12/19 1530    Visit Number 5    Number of Visits 15    Date for PT Re-Evaluation 02/07/20    Authorization Time Period Through 03/19/20    Authorization - Visit Number 5    Authorization - Number of Visits 15    Progress Note Due on Visit 10    PT Start Time 9622   he arrives 10 min late   PT Stop Time 1600    PT Time Calculation (min) 35 min    Activity Tolerance Patient tolerated treatment well    Behavior During Therapy [facility] for tasks assessed/performed           Past Medical History:  Diagnosis Date  . Arthritis of hip    bilateral  . Asthma   . CAD (coronary artery disease)    LHC 5/16:  Dx ostial 30%, LAD luminal irregs, EF 40%  . Chronic atrial fibrillation ([place]) 07/29/2019  . Chronic systolic CHF (congestive heart failure) ([place])    a. Echo 3/16:  inf and inf-lat HK, mild LVH, EF 45%, mild to mod LAE, normal RVF  . Cough   . Hiatal hernia   . NICM (nonischemic cardiomyopathy) ([place])   . Osteoarthritis   . Personal history of colonic adenomas 02/20/2013  . Pneumonia     Past Surgical History:  Procedure Laterality Date  . COLONOSCOPY  02/13/13  . LEFT AND RIGHT HEART CATHETERIZATION WITH CORONARY ANGIOGRAM N/A 08/18/2014   Procedure: LEFT AND RIGHT HEART CATHETERIZATION WITH CORONARY ANGIOGRAM;  Surgeon: [name], MD;  Location: [facility] CATH LAB;  Service: Cardiovascular;  Laterality: N/A;  . LEFT HEART CATH AND CORONARY ANGIOGRAPHY N/A 11/09/2017   Procedure: LEFT HEART CATH AND CORONARY ANGIOGRAPHY;  Surgeon: [name], MD;  Location: [place] CV LAB;  Service: Cardiovascular;  Laterality: N/A;  . TOTAL HIP  ARTHROPLASTY  2002   bilateral    There were no vitals filed for this visit.   Subjective Assessment - 12/12/19 1529    Subjective He relays 7/10 pain in his left hip but only about 3/10 in Rt hip    Pertinent History Previous B THAs in 2002    Limitations Walking;Other (comment)    How long can you sit comfortably? 20-25 minutes    How long can you stand comfortably? OK    How long can you walk comfortably? 10-15 yards    Patient Stated Goals Walk better    Pain Onset More than a month ago             [facility] Adult PT Treatment/Exercise - 12/12/19 0001      Neuro Re-ed    Neuro Re-ed Details  tandem ambulation in // bars on foam 8 ft x 10 fwd, 8 ft x 10 side to side on foam      Knee/Hip Exercises: Stretches   Piriformis Stretch Both;2 reps;30 seconds;Limitations    Other Knee/Hip Stretches single KTC stretch 30 sec x 2 bilat      Knee/Hip Exercises: Aerobic   Nustep L6 X 8 min      Knee/Hip Exercises: Supine   Bridges Limitations 2 sets of 10  Other Supine Knee/Hip Exercises supine clam shell green 2 x 10 bilateral      Manual Therapy   Manual therapy comments manual stretching for bilat H.S, hip rotators, and long axis distraction                       PT Long Term Goals - 11/12/19 1218      PT LONG TERM GOAL #1   Title [name] will report B hip pain consistently 0-2/10 on the Numeric Pain Rating Scale.    Baseline 7/10    Time 12    Period Weeks    Status New    Target Date 02/07/20      PT LONG TERM GOAL #2   Title [name] will be able to walk a mile or more without increasing hip pain at DC.    Baseline 10-15 yards    Time 12    Period Weeks    Status New    Target Date 02/07/20      PT LONG TERM GOAL #3   Title Improve B hip flexors flexibility to 100 degrees and hamstrings to 45 degrees.    Baseline 50 and 80    Time 12    Period Weeks    Status New    Target Date 02/07/20      PT LONG TERM GOAL #4   Title [name] will improve  global B hip strength to 4+/5 MMT or better by DC.    Baseline 2/5    Time 12    Period Weeks    Status New    Target Date 02/07/20      PT LONG TERM GOAL #5   Title [name] will be independent and compliant with his final HEP at DC.    Time 12    Period Weeks    Status New    Target Date 02/07/20                 Plan - 12/12/19 1556    Clinical Impression Statement Continued with therex for hip strength and ROM. Added manual therapy in efforts to improve ROM and decrease overal pain. PT will assess his response to this next visit.    Personal Factors and Comorbidities Comorbidity 1    Comorbidities B THAs in 2002    Examination-Activity Limitations Stairs;Bed Mobility;Dressing;Sleep;Locomotion Level    Stability/Clinical Decision Making Stable/Uncomplicated    Rehab Potential Good    PT Frequency Other (comment)    PT Duration 12 weeks    PT Treatment/Interventions ADLs/Self Care Home Management;Stair training;Gait training;Functional mobility training;Therapeutic activities;Therapeutic exercise;Neuromuscular re-education;Patient/family education;Balance training    PT Next Visit Plan Complaint surface balance intervention, hip strengthening.    PT Home Exercise Plan Access Code: [email or web address]    Consulted and Agree with Plan of Care Patient           Patient will benefit from skilled therapeutic intervention in order to improve the following deficits and impairments:  Abnormal gait, Decreased activity tolerance, Decreased endurance, Decreased range of motion, Decreased mobility, Decreased strength, Difficulty walking, Hypomobility, Impaired flexibility, Pain  Visit Diagnosis: Pain in left hip  Pain in right hip  Stiffness of left hip, not elsewhere classified  Stiffness of right hip, not elsewhere classified  Difficulty in walking, not elsewhere classified     Problem List Patient Active Problem List   Diagnosis Date Noted  . Non-Obstructive CAD 07/31/2019  .  Demand ischemia ([place]) 07/31/2019  . Dyslipidemia 07/31/2019  .  Acute on chronic systolic CHF (congestive heart failure) ([place]) 07/29/2019  . Persistent atrial fibrillation ([place]) 07/29/2019  . Acute systolic CHF (congestive heart failure) ([place]) 07/29/2019  . Non-cardiac chest pain 11/08/2017  . Asthma with acute exacerbation 10/06/2014  . History of asthma 08/15/2014  . Hemoptysis 08/15/2014  . Chronic cough 08/15/2014  . Cardiomyopathy ([place]) 08/13/2014  . Asthma exacerbation 07/16/2014  . Pleuritic chest pain 07/16/2014  . Asthma, chronic 02/19/2014  . Personal history of colonic adenomas 02/20/2013  . Dyspnea 12/16/2011  . Chest pain with moderate risk for cardiac etiology 12/16/2011  . HIATAL HERNIA 07/11/2006  . OSTEOARTHRITIS 07/11/2006  . ARTHRITIS, HIPS, BILATERAL 07/11/2006    Silvestre Mesi 12/12/2019, 4:00 PM  [facility] [address] [place], [place], [address] Phone: [phone]   Fax:  [phone]  Name: Marvin Chandler MRN: [id number] Date of Birth: [birth year]

## 2019-12-18 ENCOUNTER — Encounter: Payer: Self-pay | Admitting: Rehabilitative and Restorative Service Providers"

## 2019-12-18 ENCOUNTER — Other Ambulatory Visit: Payer: Self-pay

## 2019-12-18 ENCOUNTER — Ambulatory Visit (INDEPENDENT_AMBULATORY_CARE_PROVIDER_SITE_OTHER): Payer: No Typology Code available for payment source | Admitting: Rehabilitative and Restorative Service Providers"

## 2019-12-18 DIAGNOSIS — M25551 Pain in right hip: Secondary | ICD-10-CM | POA: Diagnosis not present

## 2019-12-18 DIAGNOSIS — R262 Difficulty in walking, not elsewhere classified: Secondary | ICD-10-CM

## 2019-12-18 DIAGNOSIS — M25652 Stiffness of left hip, not elsewhere classified: Secondary | ICD-10-CM

## 2019-12-18 DIAGNOSIS — M25651 Stiffness of right hip, not elsewhere classified: Secondary | ICD-10-CM | POA: Diagnosis not present

## 2019-12-18 DIAGNOSIS — M25552 Pain in left hip: Secondary | ICD-10-CM

## 2019-12-18 NOTE — Therapy (Signed)
[place], [place], [address] Phone: [phone]   Fax:  [phone]  Physical Therapy Treatment  Patient Details  Name: Marvin Chandler MRN: [id number] Date of Birth: May 17, 1950 Referring Provider (PT): Marvin Koyanagi MD   Encounter Date: 12/18/2019   PT End of Session - 12/18/19 1056    Visit Number 6    Number of Visits 15    Date for PT Re-Evaluation 02/07/20    Authorization Time Period Through 03/19/20    Authorization - Visit Number 6    Authorization - Number of Visits 15    Progress Note Due on Visit 10    PT Start Time 1057    PT Stop Time 1136    PT Time Calculation (min) 39 min    Activity Tolerance Patient tolerated treatment well    Behavior During Therapy [facility] for tasks assessed/performed           Past Medical History:  Diagnosis Date   Arthritis of hip    bilateral   Asthma    CAD (coronary artery disease)    LHC 5/16:  Dx ostial 30%, LAD luminal irregs, EF 40%   Chronic atrial fibrillation ([place]) 9/37/3428   Chronic systolic CHF (congestive heart failure) ([place])    a. Echo 3/16:  inf and inf-lat HK, mild LVH, EF 45%, mild to mod LAE, normal RVF   Cough    Hiatal hernia    NICM (nonischemic cardiomyopathy) ([place])    Osteoarthritis    Personal history of colonic adenomas 02/20/2013   Pneumonia     Past Surgical History:  Procedure Laterality Date   COLONOSCOPY  02/13/13   LEFT AND RIGHT HEART CATHETERIZATION WITH CORONARY ANGIOGRAM N/A 08/18/2014   Procedure: LEFT AND RIGHT HEART CATHETERIZATION WITH CORONARY ANGIOGRAM;  Surgeon: Marvin Booze, MD;  Location: [facility] CATH LAB;  Service: Cardiovascular;  Laterality: N/A;   LEFT HEART CATH AND CORONARY ANGIOGRAPHY N/A 11/09/2017   Procedure: LEFT HEART CATH AND CORONARY ANGIOGRAPHY;  Surgeon: Marvin Harp, MD;  Location: [place] CV LAB;  Service: Cardiovascular;  Laterality: N/A;   TOTAL HIP ARTHROPLASTY  2002    bilateral    There were no vitals filed for this visit.   Subjective Assessment - 12/18/19 1107    Subjective Pt. stated pain in Lt hip moderate to severe at times today.  Feeling more trouble today on lateral hip.    Pertinent History Previous B THAs in 2002    Limitations Walking;Other (comment)    How long can you sit comfortably? 20-25 minutes    How long can you stand comfortably? OK    How long can you walk comfortably? 10-15 yards    Patient Stated Goals Walk better    Currently in Pain? Yes    Pain Score 7     Pain Location Hip    Pain Orientation Left    Pain Descriptors / Indicators Aching;Sharp    Pain Type Chronic pain    Pain Onset More than a month ago    Pain Frequency Intermittent    Aggravating Factors  standing/walking, transfers, lying on Lt side    Pain Relieving Factors physical therapy helps at times    Effect of Pain on Daily Activities Walking/ standing limits                             [facility] Adult PT Treatment/Exercise - 12/18/19 0001  Neuro Re-ed    Neuro Re-ed Details  tandem ambulation in // bars on foam 8 ft x 8 fwd, 8 ft x 8 side to side on foam, lateral stepping 3 cones x 8 bilateral      Knee/Hip Exercises: Aerobic   Nustep Lvl 6 10 mins      Knee/Hip Exercises: Standing   Lateral Step Up 2 sets;10 reps;Step Height: 6";Both      Knee/Hip Exercises: Sidelying   Clams 3 x 10 Lt LE      Manual Therapy   Manual therapy comments compression to Lt glute med/min            Trigger Point Dry Needling - 12/18/19 0001    Consent Given? Yes    Education Handout Provided Yes    Muscles Treated Back/Hip Gluteus medius;Gluteus minimus   Lt   Gluteus Minimus Response Twitch response elicited    Gluteus Medius Response Twitch response elicited                     PT Long Term Goals - 11/12/19 1218      PT LONG TERM GOAL #1   Title Marvin Chandler will report B hip pain consistently 0-2/10 on the Numeric Pain Rating  Scale.    Baseline 7/10    Time 12    Period Weeks    Status New    Target Date 02/07/20      PT LONG TERM GOAL #2   Title Marvin Chandler will be able to walk a mile or more without increasing hip pain at DC.    Baseline 10-15 yards    Time 12    Period Weeks    Status New    Target Date 02/07/20      PT LONG TERM GOAL #3   Title Improve B hip flexors flexibility to 100 degrees and hamstrings to 45 degrees.    Baseline 50 and 80    Time 12    Period Weeks    Status New    Target Date 02/07/20      PT LONG TERM GOAL #4   Title Marvin Chandler will improve global B hip strength to 4+/5 MMT or better by DC.    Baseline 2/5    Time 12    Period Weeks    Status New    Target Date 02/07/20      PT LONG TERM GOAL #5   Title Marvin Chandler will be independent and compliant with his final HEP at DC.    Time 12    Period Weeks    Status New    Target Date 02/07/20                 Plan - 12/18/19 1108    Clinical Impression Statement Assessment today indicated concordant symptoms c palpation and activation of Lt superior glute med/min region.  Pt. indicated reduced pain in standing/walking post manual treatment.  Pt. may continue to benefit from skilled PT services c review of TrP release techniques for concordant symptoms.    Personal Factors and Comorbidities Comorbidity 1    Comorbidities B THAs in 2002    Examination-Activity Limitations Stairs;Bed Mobility;Dressing;Sleep;Locomotion Level    Stability/Clinical Decision Making Stable/Uncomplicated    Rehab Potential Good    PT Frequency Other (comment)    PT Duration 12 weeks    PT Treatment/Interventions ADLs/Self Care Home Management;Stair training;Gait training;Functional mobility training;Therapeutic activities;Therapeutic exercise;Neuromuscular re-education;Patient/family education;Balance training    PT Next Visit Plan  DN/myofascial trigger point release next visit, continued strengthnning.    PT Home Exercise Plan Access Code:  [email or web address]    NVBTYOMAY and Agree with Plan of Care Patient           Patient will benefit from skilled therapeutic intervention in order to improve the following deficits and impairments:  Abnormal gait, Decreased activity tolerance, Decreased endurance, Decreased range of motion, Decreased mobility, Decreased strength, Difficulty walking, Hypomobility, Impaired flexibility, Pain  Visit Diagnosis: Pain in left hip  Pain in right hip  Stiffness of left hip, not elsewhere classified  Stiffness of right hip, not elsewhere classified  Difficulty in walking, not elsewhere classified     Problem List Patient Active Problem List   Diagnosis Date Noted   Non-Obstructive CAD 07/31/2019   Demand ischemia ([place]) 07/31/2019   Dyslipidemia 07/31/2019   Acute on chronic systolic CHF (congestive heart failure) ([place]) 07/29/2019   Persistent atrial fibrillation ([place]) 04/59/9774   Acute systolic CHF (congestive heart failure) ([place]) 07/29/2019   Non-cardiac chest pain 11/08/2017   Asthma with acute exacerbation 10/06/2014   History of asthma 08/15/2014   Hemoptysis 08/15/2014   Chronic cough 08/15/2014   Cardiomyopathy ([place]) 08/13/2014   Asthma exacerbation 07/16/2014   Pleuritic chest pain 07/16/2014   Asthma, chronic 02/19/2014   Personal history of colonic adenomas 02/20/2013   Dyspnea 12/16/2011   Chest pain with moderate risk for cardiac etiology 12/16/2011   HIATAL HERNIA 07/11/2006   OSTEOARTHRITIS 07/11/2006   ARTHRITIS, HIPS, BILATERAL 07/11/2006   Scot Jun, PT, DPT, OCS, ATC 12/18/19  11:27 AM    [facility] [address] [place], [place], [address] Phone: [phone]   Fax:  [phone]  Name: Marvin Chandler MRN: [id number] Date of Birth: 03/31/1951

## 2019-12-25 ENCOUNTER — Other Ambulatory Visit: Payer: Self-pay

## 2019-12-25 ENCOUNTER — Ambulatory Visit (INDEPENDENT_AMBULATORY_CARE_PROVIDER_SITE_OTHER): Payer: No Typology Code available for payment source | Admitting: Rehabilitative and Restorative Service Providers"

## 2019-12-25 ENCOUNTER — Encounter: Payer: Self-pay | Admitting: Rehabilitative and Restorative Service Providers"

## 2019-12-25 DIAGNOSIS — M25652 Stiffness of left hip, not elsewhere classified: Secondary | ICD-10-CM | POA: Diagnosis not present

## 2019-12-25 DIAGNOSIS — R262 Difficulty in walking, not elsewhere classified: Secondary | ICD-10-CM

## 2019-12-25 DIAGNOSIS — M25552 Pain in left hip: Secondary | ICD-10-CM

## 2019-12-25 DIAGNOSIS — M25651 Stiffness of right hip, not elsewhere classified: Secondary | ICD-10-CM | POA: Diagnosis not present

## 2019-12-25 DIAGNOSIS — M25551 Pain in right hip: Secondary | ICD-10-CM | POA: Diagnosis not present

## 2019-12-25 NOTE — Therapy (Signed)
[facility] [address] [place], [place], [address] Phone: [phone]   Fax:  [phone]  Physical Therapy Treatment  Patient Details  Name: Marvin Chandler MRN: [id number] Date of Birth: 07-24-1950 Referring Provider (PT): Leandrew Koyanagi MD   Encounter Date: 12/25/2019   PT End of Session - 12/25/19 1114    Visit Number 7    Number of Visits 15    Date for PT Re-Evaluation 02/07/20    Authorization Time Period Through 03/19/20    Authorization - Visit Number 7    Authorization - Number of Visits 15    Progress Note Due on Visit 10    PT Start Time 1104    PT Stop Time 1144    PT Time Calculation (min) 40 min    Activity Tolerance Patient tolerated treatment well    Behavior During Therapy [facility] for tasks assessed/performed           Past Medical History:  Diagnosis Date  . Arthritis of hip    bilateral  . Asthma   . CAD (coronary artery disease)    LHC 5/16:  Dx ostial 30%, LAD luminal irregs, EF 40%  . Chronic atrial fibrillation ([place]) 07/29/2019  . Chronic systolic CHF (congestive heart failure) ([place])    a. Echo 3/16:  inf and inf-lat HK, mild LVH, EF 45%, mild to mod LAE, normal RVF  . Cough   . Hiatal hernia   . NICM (nonischemic cardiomyopathy) ([place])   . Osteoarthritis   . Personal history of colonic adenomas 02/20/2013  . Pneumonia     Past Surgical History:  Procedure Laterality Date  . COLONOSCOPY  02/13/13  . LEFT AND RIGHT HEART CATHETERIZATION WITH CORONARY ANGIOGRAM N/A 08/18/2014   Procedure: LEFT AND RIGHT HEART CATHETERIZATION WITH CORONARY ANGIOGRAM;  Surgeon: [name], MD;  Location: [facility] CATH LAB;  Service: Cardiovascular;  Laterality: N/A;  . LEFT HEART CATH AND CORONARY ANGIOGRAPHY N/A 11/09/2017   Procedure: LEFT HEART CATH AND CORONARY ANGIOGRAPHY;  Surgeon: [name], MD;  Location: [place] CV LAB;  Service: Cardiovascular;  Laterality: N/A;  . TOTAL HIP ARTHROPLASTY  2002    bilateral    There were no vitals filed for this visit.   Subjective Assessment - 12/25/19 1113    Subjective Pt. indicated feeling improvement in lateral hip pain since last visit.  Primary complaint noted in anterior hip today, sometimes in walking, sometimes at night.    Pertinent History Previous B THAs in 2002    Limitations Walking;Other (comment)    How long can you sit comfortably? 20-25 minutes    How long can you stand comfortably? OK    How long can you walk comfortably? 10-15 yards    Patient Stated Goals Walk better    Pain Score 6    at worst anterior hip   Pain Location Hip    Pain Orientation Left;Anterior    Pain Descriptors / Indicators Aching;Sharp    Pain Type Chronic pain    Pain Onset More than a month ago    Pain Frequency Intermittent    Aggravating Factors  nighttime, walking    Pain Relieving Factors DN to lateral hip helped                             [facility] Adult PT Treatment/Exercise - 12/25/19 0001      Knee/Hip Exercises: Stretches   Other Knee/Hip Stretches  supine thomas 15 sec x 3    Other Knee/Hip Stretches runner stretch at wall 30 sec x 3 bilateral c trunk stability focus for hip flexor stretch      Knee/Hip Exercises: Aerobic   Nustep Lvl 6 10 mins      Knee/Hip Exercises: Supine   Bridges Limitations 3 x 10      Knee/Hip Exercises: Sidelying   Clams 3 x 10 Lt LE      Manual Therapy   Manual therapy comments compression to Lt glute med/min, percussive device to same as well as rectus femoris, proximal            Trigger Point Dry Needling - 12/25/19 0001    Consent Given? Yes    Education Handout Provided Previously provided    Muscles Treated Back/Hip Gluteus medius;Gluteus minimus    Gluteus Minimus Response Twitch response elicited    Gluteus Medius Response Twitch response elicited                     PT Long Term Goals - 11/12/19 1218      PT LONG TERM GOAL #1   Title [name] will report B  hip pain consistently 0-2/10 on the Numeric Pain Rating Scale.    Baseline 7/10    Time 12    Period Weeks    Status New    Target Date 02/07/20      PT LONG TERM GOAL #2   Title [name] will be able to walk a mile or more without increasing hip pain at DC.    Baseline 10-15 yards    Time 12    Period Weeks    Status New    Target Date 02/07/20      PT LONG TERM GOAL #3   Title Improve B hip flexors flexibility to 100 degrees and hamstrings to 45 degrees.    Baseline 50 and 80    Time 12    Period Weeks    Status New    Target Date 02/07/20      PT LONG TERM GOAL #4   Title [name] will improve global B hip strength to 4+/5 MMT or better by DC.    Baseline 2/5    Time 12    Period Weeks    Status New    Target Date 02/07/20      PT LONG TERM GOAL #5   Title [name] will be independent and compliant with his final HEP at DC.    Time 12    Period Weeks    Status New    Target Date 02/07/20                 Plan - 12/25/19 1115    Clinical Impression Statement Pt. inidcated feeling improvement in last visit from lateral hip treatment.  Evidence of continued presence of antalgic gait but improved stance noted.  Hip extension limitation observed.  Addressed c quad/hip flexor mobility intervention to promote improved motion to facilitate terminal stance.    Personal Factors and Comorbidities Comorbidity 1    Comorbidities B THAs in 2002    Examination-Activity Limitations Stairs;Bed Mobility;Dressing;Sleep;Locomotion Level    Stability/Clinical Decision Making Stable/Uncomplicated    Rehab Potential Good    PT Frequency Other (comment)    PT Duration 12 weeks    PT Treatment/Interventions ADLs/Self Care Home Management;Stair training;Gait training;Functional mobility training;Therapeutic activities;Therapeutic exercise;Neuromuscular re-education;Patient/family education;Balance training    PT Next Visit Plan DN prn, hip  strengthening/mobility    PT Home Exercise Plan  Access Code: [email or web address]    Consulted and Agree with Plan of Care Patient           Patient will benefit from skilled therapeutic intervention in order to improve the following deficits and impairments:  Abnormal gait, Decreased activity tolerance, Decreased endurance, Decreased range of motion, Decreased mobility, Decreased strength, Difficulty walking, Hypomobility, Impaired flexibility, Pain  Visit Diagnosis: Pain in left hip  Pain in right hip  Stiffness of left hip, not elsewhere classified  Stiffness of right hip, not elsewhere classified  Difficulty in walking, not elsewhere classified     Problem List Patient Active Problem List   Diagnosis Date Noted  . Non-Obstructive CAD 07/31/2019  . Demand ischemia ([place]) 07/31/2019  . Dyslipidemia 07/31/2019  . Acute on chronic systolic CHF (congestive heart failure) ([place]) 07/29/2019  . Persistent atrial fibrillation ([place]) 07/29/2019  . Acute systolic CHF (congestive heart failure) ([place]) 07/29/2019  . Non-cardiac chest pain 11/08/2017  . Asthma with acute exacerbation 10/06/2014  . History of asthma 08/15/2014  . Hemoptysis 08/15/2014  . Chronic cough 08/15/2014  . Cardiomyopathy ([place]) 08/13/2014  . Asthma exacerbation 07/16/2014  . Pleuritic chest pain 07/16/2014  . Asthma, chronic 02/19/2014  . Personal history of colonic adenomas 02/20/2013  . Dyspnea 12/16/2011  . Chest pain with moderate risk for cardiac etiology 12/16/2011  . HIATAL HERNIA 07/11/2006  . OSTEOARTHRITIS 07/11/2006  . ARTHRITIS, HIPS, BILATERAL 07/11/2006   Scot Jun, PT, DPT, OCS, ATC 12/25/19  11:38 AM    [facility] [address] [place], [place], [address] Phone: [phone]   Fax:  [phone]  Name: Marvin Chandler MRN: [id number] Date of Birth: October 02, 1950

## 2020-01-08 ENCOUNTER — Other Ambulatory Visit (HOSPITAL_COMMUNITY): Payer: Medicare Other

## 2020-01-14 ENCOUNTER — Ambulatory Visit (INDEPENDENT_AMBULATORY_CARE_PROVIDER_SITE_OTHER): Payer: No Typology Code available for payment source | Admitting: Rehabilitative and Restorative Service Providers"

## 2020-01-14 ENCOUNTER — Encounter: Payer: Self-pay | Admitting: Rehabilitative and Restorative Service Providers"

## 2020-01-14 ENCOUNTER — Other Ambulatory Visit: Payer: Self-pay

## 2020-01-14 DIAGNOSIS — M25651 Stiffness of right hip, not elsewhere classified: Secondary | ICD-10-CM | POA: Diagnosis not present

## 2020-01-14 DIAGNOSIS — M25551 Pain in right hip: Secondary | ICD-10-CM

## 2020-01-14 DIAGNOSIS — M25552 Pain in left hip: Secondary | ICD-10-CM | POA: Diagnosis not present

## 2020-01-14 DIAGNOSIS — M25652 Stiffness of left hip, not elsewhere classified: Secondary | ICD-10-CM | POA: Diagnosis not present

## 2020-01-14 DIAGNOSIS — R262 Difficulty in walking, not elsewhere classified: Secondary | ICD-10-CM

## 2020-01-14 NOTE — Therapy (Signed)
[place], [place], [address] Phone: [phone]   Fax:  [phone]  Physical Therapy Treatment  Patient Details  Name: Marvin Chandler MRN: [id number] Date of Birth: 04-27-1951 Referring Provider (PT): Leandrew Koyanagi MD   Encounter Date: 01/14/2020   PT End of Session - 01/14/20 1108    Visit Number 8    Number of Visits 15    Date for PT Re-Evaluation 02/07/20    Authorization Time Period Through 03/19/20    Authorization - Visit Number 8    Authorization - Number of Visits 15    Progress Note Due on Visit 10    PT Start Time 1108    PT Stop Time 1148    PT Time Calculation (min) 40 min    Activity Tolerance Patient tolerated treatment well    Behavior During Therapy [facility] for tasks assessed/performed           Past Medical History:  Diagnosis Date  . Arthritis of hip    bilateral  . Asthma   . CAD (coronary artery disease)    LHC 5/16:  Dx ostial 30%, LAD luminal irregs, EF 40%  . Chronic atrial fibrillation ([place]) 07/29/2019  . Chronic systolic CHF (congestive heart failure) ([place])    a. Echo 3/16:  inf and inf-lat HK, mild LVH, EF 45%, mild to mod LAE, normal RVF  . Cough   . Hiatal hernia   . NICM (nonischemic cardiomyopathy) ([place])   . Osteoarthritis   . Personal history of colonic adenomas 02/20/2013  . Pneumonia     Past Surgical History:  Procedure Laterality Date  . COLONOSCOPY  02/13/13  . LEFT AND RIGHT HEART CATHETERIZATION WITH CORONARY ANGIOGRAM N/A 08/18/2014   Procedure: LEFT AND RIGHT HEART CATHETERIZATION WITH CORONARY ANGIOGRAM;  Surgeon: [name], MD;  Location: [facility] CATH LAB;  Service: Cardiovascular;  Laterality: N/A;  . LEFT HEART CATH AND CORONARY ANGIOGRAPHY N/A 11/09/2017   Procedure: LEFT HEART CATH AND CORONARY ANGIOGRAPHY;  Surgeon: [name], MD;  Location: [place] CV LAB;  Service: Cardiovascular;  Laterality: N/A;  . TOTAL HIP ARTHROPLASTY  2002    bilateral    There were no vitals filed for this visit.   Subjective Assessment - 01/14/20 1127    Subjective Pt. stated continued overall improvement in walking with reduced complaints overall.  No specific pain reported upon arrival.    Pertinent History Previous B THAs in 2002    Limitations Walking;Other (comment)    How long can you sit comfortably? 20-25 minutes    How long can you stand comfortably? OK    How long can you walk comfortably? 10-15 yards    Patient Stated Goals Walk better    Currently in Pain? No/denies    Pain Onset More than a month ago                             [facility] Adult PT Treatment/Exercise - 01/14/20 0001      Neuro Re-ed    Neuro Re-ed Details  in // bars:  tandem ambulaton on foam 8 ft x 6 fwd/back, tandem stance on foam 1 min each, lateral stepping c green band 10 ft x 5 each way,       Knee/Hip Exercises: Stretches   Other Knee/Hip Stretches runner stretch at wall 30 sec x 3 bilateral c trunk stability focus for hip flexor stretch  Knee/Hip Exercises: Aerobic   Nustep lvl 6 10 mins      Knee/Hip Exercises: Machines for Strengthening   Total Gym Leg Press 50 lbs SL x 15 each LE      Knee/Hip Exercises: Standing   Lateral Step Up 3 sets;10 reps;Step Height: 6";Both                       PT Long Term Goals - 01/14/20 1125      PT LONG TERM GOAL #1   Title [name] will report B hip pain consistently 0-2/10 on the Numeric Pain Rating Scale.    Baseline 7/10    Time 12    Period Weeks    Status On-going    Target Date 02/07/20      PT LONG TERM GOAL #2   Title [name] will be able to walk a mile or more without increasing hip pain at DC.    Baseline 10-15 yards    Time 12    Period Weeks    Status Partially Met    Target Date 02/07/20      PT LONG TERM GOAL #3   Title Improve B hip flexors flexibility to 100 degrees and hamstrings to 45 degrees.    Baseline 50 and 80    Time 12    Period Weeks     Status On-going    Target Date 02/07/20      PT LONG TERM GOAL #4   Title [name] will improve global B hip strength to 4+/5 MMT or better by DC.    Baseline 2/5    Time 12    Period Weeks    Status On-going    Target Date 02/07/20      PT LONG TERM GOAL #5   Title [name] will be independent and compliant with his final HEP at DC.    Time 12    Period Weeks    Status On-going    Target Date 02/07/20                 Plan - 01/14/20 1145    Clinical Impression Statement Increased hip flexion movement created complaints at end range, as noted during leg press set up.  Adapted movement amount to perform in available painfree range.  Overall continued improvement in gait due to steady progression in strength and balance control evident but deficits still noted.    Personal Factors and Comorbidities Comorbidity 1    Comorbidities B THAs in 2002    Examination-Activity Limitations Stairs;Bed Mobility;Dressing;Sleep;Locomotion Level    Stability/Clinical Decision Making Stable/Uncomplicated    Rehab Potential Good    PT Frequency Other (comment)    PT Duration 12 weeks    PT Treatment/Interventions ADLs/Self Care Home Management;Stair training;Gait training;Functional mobility training;Therapeutic activities;Therapeutic exercise;Neuromuscular re-education;Patient/family education;Balance training    PT Next Visit Plan DN prn, hip strengthening/mobility    PT Home Exercise Plan Access Code: [email or web address]    Consulted and Agree with Plan of Care Patient           Patient will benefit from skilled therapeutic intervention in order to improve the following deficits and impairments:  Abnormal gait, Decreased activity tolerance, Decreased endurance, Decreased range of motion, Decreased mobility, Decreased strength, Difficulty walking, Hypomobility, Impaired flexibility, Pain  Visit Diagnosis: Pain in left hip  Pain in right hip  Stiffness of left hip, not elsewhere  classified  Stiffness of right hip, not elsewhere classified  Difficulty in walking, not  elsewhere classified     Problem List Patient Active Problem List   Diagnosis Date Noted  . Non-Obstructive CAD 07/31/2019  . Demand ischemia ([place]) 07/31/2019  . Dyslipidemia 07/31/2019  . Acute on chronic systolic CHF (congestive heart failure) ([place]) 07/29/2019  . Persistent atrial fibrillation ([place]) 07/29/2019  . Acute systolic CHF (congestive heart failure) ([place]) 07/29/2019  . Non-cardiac chest pain 11/08/2017  . Asthma with acute exacerbation 10/06/2014  . History of asthma 08/15/2014  . Hemoptysis 08/15/2014  . Chronic cough 08/15/2014  . Cardiomyopathy ([place]) 08/13/2014  . Asthma exacerbation 07/16/2014  . Pleuritic chest pain 07/16/2014  . Asthma, chronic 02/19/2014  . Personal history of colonic adenomas 02/20/2013  . Dyspnea 12/16/2011  . Chest pain with moderate risk for cardiac etiology 12/16/2011  . HIATAL HERNIA 07/11/2006  . OSTEOARTHRITIS 07/11/2006  . ARTHRITIS, HIPS, BILATERAL 07/11/2006    Scot Jun, PT, DPT, OCS, ATC 01/14/20  11:48 AM    [facility] [address] [place], [place], [address] Phone: [phone]   Fax:  [phone]  Name: ANTHONY TAMBURO MRN: [id number] Date of Birth: [birth year]

## 2020-01-21 ENCOUNTER — Ambulatory Visit (INDEPENDENT_AMBULATORY_CARE_PROVIDER_SITE_OTHER): Payer: No Typology Code available for payment source | Admitting: Rehabilitative and Restorative Service Providers"

## 2020-01-21 ENCOUNTER — Other Ambulatory Visit: Payer: Self-pay

## 2020-01-21 ENCOUNTER — Encounter: Payer: Self-pay | Admitting: Rehabilitative and Restorative Service Providers"

## 2020-01-21 DIAGNOSIS — M25652 Stiffness of left hip, not elsewhere classified: Secondary | ICD-10-CM

## 2020-01-21 DIAGNOSIS — R262 Difficulty in walking, not elsewhere classified: Secondary | ICD-10-CM

## 2020-01-21 DIAGNOSIS — M25651 Stiffness of right hip, not elsewhere classified: Secondary | ICD-10-CM

## 2020-01-21 DIAGNOSIS — M25551 Pain in right hip: Secondary | ICD-10-CM | POA: Diagnosis not present

## 2020-01-21 DIAGNOSIS — M25552 Pain in left hip: Secondary | ICD-10-CM | POA: Diagnosis not present

## 2020-01-21 NOTE — Therapy (Signed)
[facility] Physical Therapy [address] [place], [place], [address] Phone: [phone]   Fax:  [phone]  Physical Therapy Treatment  Patient Details  Name: Marvin Chandler MRN: [id number] Date of Birth: February 10, 1951 Referring Provider (PT): Leandrew Koyanagi MD   Encounter Date: 01/21/2020   PT End of Session - 01/21/20 1157    Visit Number 9    Number of Visits 15    Date for PT Re-Evaluation 02/07/20    Authorization Time Period Through 03/19/20    Authorization - Visit Number 9    Authorization - Number of Visits 15    Progress Note Due on Visit 10    PT Start Time 1150    PT Stop Time 1229    PT Time Calculation (min) 39 min    Activity Tolerance Other (comment)   Calf complaints noted and adjusted activity accordingly   Behavior During Therapy [facility] for tasks assessed/performed           Past Medical History:  Diagnosis Date  . Arthritis of hip    bilateral  . Asthma   . CAD (coronary artery disease)    LHC 5/16:  Dx ostial 30%, LAD luminal irregs, EF 40%  . Chronic atrial fibrillation ([place]) 07/29/2019  . Chronic systolic CHF (congestive heart failure) ([place])    a. Echo 3/16:  inf and inf-lat HK, mild LVH, EF 45%, mild to mod LAE, normal RVF  . Cough   . Hiatal hernia   . NICM (nonischemic cardiomyopathy) ([place])   . Osteoarthritis   . Personal history of colonic adenomas 02/20/2013  . Pneumonia     Past Surgical History:  Procedure Laterality Date  . COLONOSCOPY  02/13/13  . LEFT AND RIGHT HEART CATHETERIZATION WITH CORONARY ANGIOGRAM N/A 08/18/2014   Procedure: LEFT AND RIGHT HEART CATHETERIZATION WITH CORONARY ANGIOGRAM;  Surgeon: [name], MD;  Location: [facility] CATH LAB;  Service: Cardiovascular;  Laterality: N/A;  . LEFT HEART CATH AND CORONARY ANGIOGRAPHY N/A 11/09/2017   Procedure: LEFT HEART CATH AND CORONARY ANGIOGRAPHY;  Surgeon: [name], MD;  Location: [facility] CV LAB;  Service: Cardiovascular;  Laterality: N/A;  .  TOTAL HIP ARTHROPLASTY  2002   bilateral    There were no vitals filed for this visit.   Subjective Assessment - 01/21/20 1153    Subjective Pt. stated having soreness more noted in last few days from Lt hip.  Pt. stated 5/10 or so.    Pertinent History Previous B THAs in 2002    Limitations Walking;Other (comment)    How long can you sit comfortably? 20-25 minutes    How long can you stand comfortably? OK    How long can you walk comfortably? 10-15 yards    Patient Stated Goals Walk better    Currently in Pain? Yes    Pain Score 5     Pain Location Hip    Pain Orientation Left    Pain Descriptors / Indicators Aching;Sore    Pain Onset More than a month ago    Aggravating Factors  unsure    Pain Relieving Factors resting                             [facility] Adult PT Treatment/Exercise - 01/21/20 0001      Knee/Hip Exercises: Stretches   Passive Hamstring Stretch Right;30 seconds;5 reps    Other Knee/Hip Stretches modified thomas stretch c floor support 30  sec x 5 bilateral    Other Knee/Hip Stretches incline DL 30 sec x 5 calf stretch      Knee/Hip Exercises: Aerobic   Other Aerobic LE use on UBE combo machine 4 mins lvl 4.5      Knee/Hip Exercises: Standing   Other Standing Knee Exercises sit to stand 20 inch table 20 x      Knee/Hip Exercises: Supine   Bridges Both;3 sets;10 reps    Other Supine Knee/Hip Exercises supine clam shell 3 x 10 bilateral, blue band                       PT Long Term Goals - 01/14/20 1125      PT LONG TERM GOAL #1   Title [name] will report B hip pain consistently 0-2/10 on the Numeric Pain Rating Scale.    Baseline 7/10    Time 12    Period Weeks    Status On-going    Target Date 02/07/20      PT LONG TERM GOAL #2   Title [name] will be able to walk a mile or more without increasing hip pain at DC.    Baseline 10-15 yards    Time 12    Period Weeks    Status Partially Met    Target Date 02/07/20       PT LONG TERM GOAL #3   Title Improve B hip flexors flexibility to 100 degrees and hamstrings to 45 degrees.    Baseline 50 and 80    Time 12    Period Weeks    Status On-going    Target Date 02/07/20      PT LONG TERM GOAL #4   Title [name] will improve global B hip strength to 4+/5 MMT or better by DC.    Baseline 2/5    Time 12    Period Weeks    Status On-going    Target Date 02/07/20      PT LONG TERM GOAL #5   Title [name] will be independent and compliant with his final HEP at DC.    Time 12    Period Weeks    Status On-going    Target Date 02/07/20                 Plan - 01/21/20 1158    Clinical Impression Statement Complaints of Rt calf tightness evident in bike attempt and resulted in cessation of activity due to symptoms.  Addressed c incline calf stretch to affected area c improvement reported.  Continued presentation of limitations in hip extension (hip flexor tightness) as well as hip weakness present and limiting for daily mobility.    Personal Factors and Comorbidities Comorbidity 1    Comorbidities B THAs in 2002    Examination-Activity Limitations Stairs;Bed Mobility;Dressing;Sleep;Locomotion Level    Stability/Clinical Decision Making Stable/Uncomplicated    Rehab Potential Good    PT Frequency Other (comment)    PT Duration 12 weeks    PT Treatment/Interventions ADLs/Self Care Home Management;Stair training;Gait training;Functional mobility training;Therapeutic activities;Therapeutic exercise;Neuromuscular re-education;Patient/family education;Balance training    PT Next Visit Plan Improve hip flexor length, hip strength/control throughout.   Progress Note on 10th visit    PT Home Exercise Plan Access Code: [email or web address]    Consulted and Agree with Plan of Care Patient           Patient will benefit from skilled therapeutic intervention in order to improve the following deficits and  impairments:  Abnormal gait, Decreased activity tolerance,  Decreased endurance, Decreased range of motion, Decreased mobility, Decreased strength, Difficulty walking, Hypomobility, Impaired flexibility, Pain  Visit Diagnosis: Pain in left hip  Pain in right hip  Stiffness of left hip, not elsewhere classified  Stiffness of right hip, not elsewhere classified  Difficulty in walking, not elsewhere classified     Problem List Patient Active Problem List   Diagnosis Date Noted  . Non-Obstructive CAD 07/31/2019  . Demand ischemia ([place]) 07/31/2019  . Dyslipidemia 07/31/2019  . Acute on chronic systolic CHF (congestive heart failure) ([place]) 07/29/2019  . Persistent atrial fibrillation ([place]) 07/29/2019  . Acute systolic CHF (congestive heart failure) ([place]) 07/29/2019  . Non-cardiac chest pain 11/08/2017  . Asthma with acute exacerbation 10/06/2014  . History of asthma 08/15/2014  . Hemoptysis 08/15/2014  . Chronic cough 08/15/2014  . Cardiomyopathy ([place]) 08/13/2014  . Asthma exacerbation 07/16/2014  . Pleuritic chest pain 07/16/2014  . Asthma, chronic 02/19/2014  . Personal history of colonic adenomas 02/20/2013  . Dyspnea 12/16/2011  . Chest pain with moderate risk for cardiac etiology 12/16/2011  . HIATAL HERNIA 07/11/2006  . OSTEOARTHRITIS 07/11/2006  . ARTHRITIS, HIPS, BILATERAL 07/11/2006    Scot Jun, PT, DPT, OCS, ATC 01/21/20  12:24 PM     [facility] [address] [place], [place], [address] Phone: [phone]   Fax:  [phone]  Name: Marvin Chandler MRN: [id number] Date of Birth: 12/17/1950

## 2020-01-27 ENCOUNTER — Ambulatory Visit: Payer: Medicare Other | Admitting: Cardiovascular Disease

## 2020-01-28 ENCOUNTER — Encounter: Payer: Self-pay | Admitting: Rehabilitative and Restorative Service Providers"

## 2020-01-28 ENCOUNTER — Ambulatory Visit (INDEPENDENT_AMBULATORY_CARE_PROVIDER_SITE_OTHER): Payer: No Typology Code available for payment source | Admitting: Rehabilitative and Restorative Service Providers"

## 2020-01-28 ENCOUNTER — Other Ambulatory Visit: Payer: Self-pay

## 2020-01-28 DIAGNOSIS — M25652 Stiffness of left hip, not elsewhere classified: Secondary | ICD-10-CM | POA: Diagnosis not present

## 2020-01-28 DIAGNOSIS — M25651 Stiffness of right hip, not elsewhere classified: Secondary | ICD-10-CM | POA: Diagnosis not present

## 2020-01-28 DIAGNOSIS — M25551 Pain in right hip: Secondary | ICD-10-CM | POA: Diagnosis not present

## 2020-01-28 DIAGNOSIS — R262 Difficulty in walking, not elsewhere classified: Secondary | ICD-10-CM

## 2020-01-28 DIAGNOSIS — M25552 Pain in left hip: Secondary | ICD-10-CM | POA: Diagnosis not present

## 2020-01-28 NOTE — Therapy (Signed)
[place], [place], [address] Phone: [phone]   Fax:  [phone]  Physical Therapy Treatment/Progress Note/Recert  Patient Details  Name: Marvin Chandler MRN: [id number] Date of Birth: 1950-08-03 Referring Provider (PT): Leandrew Koyanagi MD   Encounter Date: 01/28/2020 Progress Note Reporting Period 11/12/2019 to 01/28/2020  See note below for Objective Data and Assessment of Progress/Goals.        PT End of Session - 01/28/20 1028    Visit Number 10    Number of Visits 15    Date for PT Re-Evaluation 02/25/20    Authorization Time Period Through 03/19/20    Authorization - Visit Number 10    Authorization - Number of Visits 15    Progress Note Due on Visit 15    PT Start Time 1017    PT Stop Time 1056    PT Time Calculation (min) 39 min    Activity Tolerance Patient tolerated treatment well   Calf complaints noted and adjusted activity accordingly   Behavior During Therapy [facility] for tasks assessed/performed           Past Medical History:  Diagnosis Date  . Arthritis of hip    bilateral  . Asthma   . CAD (coronary artery disease)    LHC 5/16:  Dx ostial 30%, LAD luminal irregs, EF 40%  . Chronic atrial fibrillation ([place]) 07/29/2019  . Chronic systolic CHF (congestive heart failure) ([place])    a. Echo 3/16:  inf and inf-lat HK, mild LVH, EF 45%, mild to mod LAE, normal RVF  . Cough   . Hiatal hernia   . NICM (nonischemic cardiomyopathy) ([place])   . Osteoarthritis   . Personal history of colonic adenomas 02/20/2013  . Pneumonia     Past Surgical History:  Procedure Laterality Date  . COLONOSCOPY  02/13/13  . LEFT AND RIGHT HEART CATHETERIZATION WITH CORONARY ANGIOGRAM N/A 08/18/2014   Procedure: LEFT AND RIGHT HEART CATHETERIZATION WITH CORONARY ANGIOGRAM;  Surgeon: [name], MD;  Location: [facility] CATH LAB;  Service: Cardiovascular;  Laterality: N/A;  . LEFT HEART CATH AND CORONARY ANGIOGRAPHY N/A  11/09/2017   Procedure: LEFT HEART CATH AND CORONARY ANGIOGRAPHY;  Surgeon: [name], MD;  Location: [facility] CV LAB;  Service: Cardiovascular;  Laterality: N/A;  . TOTAL HIP ARTHROPLASTY  2002   bilateral    There were no vitals filed for this visit.   Subjective Assessment - 01/28/20 1027    Subjective Pt. indicated feeling like new stretch helped a lot in movement and walking ability.  Some mild symptoms indicated upon arrival today. GROC +3 somewhat better at this time    Pertinent History Previous B THAs in 2002    Limitations Walking;Other (comment)    Patient Stated Goals Walk better    Currently in Pain? Yes   rated mild   Pain Location Hip    Pain Orientation Left    Pain Descriptors / Indicators Aching;Sore    Pain Type Chronic pain    Pain Onset More than a month ago    Aggravating Factors  walking sometimes    Pain Relieving Factors rest, stretching helped    Effect of Pain on Daily Activities Walking/standing limits due to pain              [facility] PT Assessment - 01/28/20 0001      Assessment   Medical Diagnosis Gluteal tendinopathy    Referring Provider (PT) Erlinda Hong, Georga Kaufmann M  MD    Onset Date/Surgical Date 06/15/19      AROM   Overall AROM Comments measured in supine    Right Hip Flexion 96    Left Hip Flexion 90    Left Hip External Rotation  5    Left Hip Internal Rotation  40      Flexibility   Soft Tissue Assessment /Muscle Length yes    Quadriceps (+) Thomas test for hip flexors and rectus femoris tightness on Lt      Ambulation/Gait   Ambulation/Gait Assistance 7: Independent    Gait Comments Unrestricted distances in clinic level surfaces. Presence of trendelenburg on Lt stance, increased Lt lateral trunk lean on Lt stance, reduced stance on Lt compared to Rt                         [facility] Adult PT Treatment/Exercise - 01/28/20 0001      Neuro Re-ed    Neuro Re-ed Details  // bars: lateral hurdle stepping 10 ft x 5 each  way, SLS cones anterior, anterior/medial, anterior/lateral x 6 bilateral, tandem stance 1 min x 2 bilateral      Knee/Hip Exercises: Stretches   Other Knee/Hip Stretches incline DL 30 sec x 5 calf stretch      Knee/Hip Exercises: Supine   Bridges Both;3 sets;10 reps      Knee/Hip Exercises: Sidelying   Hip ADduction Strengthening;Left;3 sets;10 reps;Right      Manual Therapy   Manual therapy comments percussive device assist STM to Lt glute med/max, Lt QL                       PT Long Term Goals - 01/28/20 1038      PT LONG TERM GOAL #1   Title [name] will report B hip pain consistently 0-2/10 on the Numeric Pain Rating Scale.    Baseline 7/10    Time 4    Period Weeks    Status Revised    Target Date 02/25/20      PT LONG TERM GOAL #2   Title [name] will be able to walk a mile or more without increasing hip pain at DC.    Baseline 10-15 yards    Time 4    Period Weeks    Status Revised    Target Date 02/25/20      PT LONG TERM GOAL #3   Title Improve B hip flexors flexibility to 100 degrees and hamstrings to 45 degrees.    Baseline 50 and 80    Time 4    Period Weeks    Status Partially Met    Target Date 02/25/20      PT LONG TERM GOAL #4   Title [name] will improve global B hip strength to 4+/5 MMT or better by DC.    Baseline 2/5    Time 4    Period Weeks    Status On-going    Target Date 02/25/20      PT LONG TERM GOAL #5   Title [name] will be independent and compliant with his final HEP at DC.    Time 4    Period Weeks    Status Revised    Target Date 02/25/20                 Plan - 01/28/20 1029    Clinical Impression Statement Pt. has attended 10 visits overall during course of treatment  to this point.  See objective data for updated information regarding current presentation.  Pt. has demonstrated improvements in measured data as well as functional gait analysis/symptom reduction to this point but may continue to benefit from  skilled PT services to continue to gain towards LTGs.    Personal Factors and Comorbidities Comorbidity 1    Comorbidities B THAs in 2002    Examination-Activity Limitations Stairs;Bed Mobility;Dressing;Sleep;Locomotion Level    Stability/Clinical Decision Making Stable/Uncomplicated    Rehab Potential Good    PT Frequency Other (comment)   1-2x/week   PT Duration 4 weeks    PT Treatment/Interventions ADLs/Self Care Home Management;Stair training;Gait training;Functional mobility training;Therapeutic activities;Therapeutic exercise;Neuromuscular re-education;Patient/family education;Balance training    PT Next Visit Plan Improve hip flexor length, hip strength/control throughout. 5 more [facility] visits remain, poc extended for 4 weeks to accomdate    PT Home Exercise Plan Access Code: [email or web address]    Consulted and Agree with Plan of Care Patient           Patient will benefit from skilled therapeutic intervention in order to improve the following deficits and impairments:  Abnormal gait, Decreased activity tolerance, Decreased endurance, Decreased range of motion, Decreased mobility, Decreased strength, Difficulty walking, Hypomobility, Impaired flexibility, Pain  Visit Diagnosis: Pain in left hip  Pain in right hip  Stiffness of left hip, not elsewhere classified  Stiffness of right hip, not elsewhere classified  Difficulty in walking, not elsewhere classified     Problem List Patient Active Problem List   Diagnosis Date Noted  . Non-Obstructive CAD 07/31/2019  . Demand ischemia ([place]) 07/31/2019  . Dyslipidemia 07/31/2019  . Acute on chronic systolic CHF (congestive heart failure) ([place]) 07/29/2019  . Persistent atrial fibrillation ([place]) 07/29/2019  . Acute systolic CHF (congestive heart failure) ([place]) 07/29/2019  . Non-cardiac chest pain 11/08/2017  . Asthma with acute exacerbation 10/06/2014  . History of asthma 08/15/2014  . Hemoptysis 08/15/2014  . Chronic cough 08/15/2014  .  Cardiomyopathy ([place]) 08/13/2014  . Asthma exacerbation 07/16/2014  . Pleuritic chest pain 07/16/2014  . Asthma, chronic 02/19/2014  . Personal history of colonic adenomas 02/20/2013  . Dyspnea 12/16/2011  . Chest pain with moderate risk for cardiac etiology 12/16/2011  . HIATAL HERNIA 07/11/2006  . OSTEOARTHRITIS 07/11/2006  . ARTHRITIS, HIPS, BILATERAL 07/11/2006    Scot Jun, PT, DPT, OCS, ATC 01/28/20  10:48 AM    [facility] Physical Therapy [address] [place], [place], [address] Phone: [phone]   Fax:  [phone]  Name: TAVIOUS GRIESINGER MRN: [id number] Date of Birth: February 28, 1951

## 2020-02-04 ENCOUNTER — Encounter: Payer: No Typology Code available for payment source | Admitting: Rehabilitative and Restorative Service Providers"

## 2020-02-11 ENCOUNTER — Other Ambulatory Visit: Payer: Self-pay

## 2020-02-11 ENCOUNTER — Encounter: Payer: Self-pay | Admitting: Rehabilitative and Restorative Service Providers"

## 2020-02-11 ENCOUNTER — Ambulatory Visit (INDEPENDENT_AMBULATORY_CARE_PROVIDER_SITE_OTHER): Payer: No Typology Code available for payment source | Admitting: Rehabilitative and Restorative Service Providers"

## 2020-02-11 DIAGNOSIS — M25551 Pain in right hip: Secondary | ICD-10-CM

## 2020-02-11 DIAGNOSIS — M25652 Stiffness of left hip, not elsewhere classified: Secondary | ICD-10-CM | POA: Diagnosis not present

## 2020-02-11 DIAGNOSIS — M25552 Pain in left hip: Secondary | ICD-10-CM

## 2020-02-11 DIAGNOSIS — M25651 Stiffness of right hip, not elsewhere classified: Secondary | ICD-10-CM | POA: Diagnosis not present

## 2020-02-11 DIAGNOSIS — R262 Difficulty in walking, not elsewhere classified: Secondary | ICD-10-CM

## 2020-02-11 NOTE — Therapy (Signed)
[facility] Physical Therapy [address] [place], [place], [address] Phone: [phone]   Fax:  [phone]  Physical Therapy Treatment  Patient Details  Name: Marvin Chandler MRN: [id number] Date of Birth: 1951/04/09 Referring Provider (PT): [place] Koyanagi MD   Encounter Date: 02/11/2020   PT End of Session - 02/11/20 1023    Visit Number 11    Number of Visits 15    Date for PT Re-Evaluation 02/25/20    Authorization Time Period Through 03/19/20    Authorization - Visit Number 11    Authorization - Number of Visits 15    Progress Note Due on Visit 15    PT Start Time 1017    PT Stop Time 1056    PT Time Calculation (min) 39 min    Activity Tolerance Patient tolerated treatment well   Calf complaints noted and adjusted activity accordingly   Behavior During Therapy [facility] for tasks assessed/performed           Past Medical History:  Diagnosis Date  . Arthritis of hip    bilateral  . Asthma   . CAD (coronary artery disease)    LHC 5/16:  Dx ostial 30%, LAD luminal irregs, EF 40%  . Chronic atrial fibrillation ([place]) 07/29/2019  . Chronic systolic CHF (congestive heart failure) ([place])    a. Echo 3/16:  inf and inf-lat HK, mild LVH, EF 45%, mild to mod LAE, normal RVF  . Cough   . Hiatal hernia   . NICM (nonischemic cardiomyopathy) ([place])   . Osteoarthritis   . Personal history of colonic adenomas 02/20/2013  . Pneumonia     Past Surgical History:  Procedure Laterality Date  . COLONOSCOPY  02/13/13  . LEFT AND RIGHT HEART CATHETERIZATION WITH CORONARY ANGIOGRAM N/A 08/18/2014   Procedure: LEFT AND RIGHT HEART CATHETERIZATION WITH CORONARY ANGIOGRAM;  Surgeon: Marvin Booze, MD;  Location: [facility] CATH LAB;  Service: Cardiovascular;  Laterality: N/A;  . LEFT HEART CATH AND CORONARY ANGIOGRAPHY N/A 11/09/2017   Procedure: LEFT HEART CATH AND CORONARY ANGIOGRAPHY;  Surgeon: Marvin Harp, MD;  Location: [place] CV LAB;  Service: Cardiovascular;   Laterality: N/A;  . TOTAL HIP ARTHROPLASTY  2002   bilateral    There were no vitals filed for this visit.   Subjective Assessment - 02/11/20 1024    Subjective Pt. indicated feeling better, mild Lt lateral hip noted upon arrival.    Pertinent History Previous B THAs in 2002    Limitations Walking;Other (comment)    Patient Stated Goals Walk better    Currently in Pain? Yes   mild   Pain Location Hip    Pain Orientation Left    Pain Descriptors / Indicators Sore    Pain Onset More than a month ago    Aggravating Factors  insidious                             [facility] Adult PT Treatment/Exercise - 02/11/20 0001      Knee/Hip Exercises: Stretches   Other Knee/Hip Stretches incline DL 30 sec x 3 calf stretch      Knee/Hip Exercises: Aerobic   Nustep Lvl 6 10 mins      Knee/Hip Exercises: Machines for Strengthening   Total Gym Leg Press 50 lbs 2 x 15 SL, performed bilateral      Knee/Hip Exercises: Standing   Other Standing Knee Exercises hip abd doorway press 5  sec hold x 10 bilateral      Knee/Hip Exercises: Seated   Sit to Sand 2 sets;10 reps;without UE support   18 inch 10 lbs     Knee/Hip Exercises: Supine   Bridges Both;3 sets;10 reps   blue tband hip abd hold                      PT Long Term Goals - 01/28/20 1038      PT LONG TERM GOAL #1   Title Marvin Chandler will report B hip pain consistently 0-2/10 on the Numeric Pain Rating Scale.    Baseline 7/10    Time 4    Period Weeks    Status Revised    Target Date 02/25/20      PT LONG TERM GOAL #2   Title Marvin Chandler will be able to walk a mile or more without increasing hip pain at Chandler.    Baseline 10-15 yards    Time 4    Period Weeks    Status Revised    Target Date 02/25/20      PT LONG TERM GOAL #3   Title Improve B hip flexors flexibility to 100 degrees and hamstrings to 45 degrees.    Baseline 50 and 80    Time 4    Period Weeks    Status Partially Met    Target Date 02/25/20       PT LONG TERM GOAL #4   Title Marvin Chandler will improve global B hip strength to 4+/5 MMT or better by Chandler.    Baseline 2/5    Time 4    Period Weeks    Status On-going    Target Date 02/25/20      PT LONG TERM GOAL #5   Title Marvin Chandler will be independent and compliant with his final HEP at [place].    Time 4    Period Weeks    Status Revised    Target Date 02/25/20                 Plan - 02/11/20 1047    Clinical Impression Statement Pt. has continued to indicate improved symptoms overall, thought still present at times insidiously.  Pt. did continue to present c hip weakness that is improving but still requires continued skilled PT and HEP intervention.    Personal Factors and Comorbidities Comorbidity 1    Comorbidities B THAs in 2002    Examination-Activity Limitations Stairs;Bed Mobility;Dressing;Sleep;Locomotion Level    Stability/Clinical Decision Making Stable/Uncomplicated    Rehab Potential Good    PT Frequency Other (comment)   1-2x/week   PT Duration 4 weeks    PT Treatment/Interventions ADLs/Self Care Home Management;Stair training;Gait training;Functional mobility training;Therapeutic activities;Therapeutic exercise;Neuromuscular re-education;Patient/family education;Balance training    PT Next Visit Plan Continue strength/balance intervention.    PT Home Exercise Plan Access Code: [email or web address]    Consulted and Agree with Plan of Care Patient           Patient will benefit from skilled therapeutic intervention in order to improve the following deficits and impairments:  Abnormal gait, Decreased activity tolerance, Decreased endurance, Decreased range of motion, Decreased mobility, Decreased strength, Difficulty walking, Hypomobility, Impaired flexibility, Pain  Visit Diagnosis: Pain in left hip  Pain in right hip  Stiffness of left hip, not elsewhere classified  Stiffness of right hip, not elsewhere classified  Difficulty in walking, not elsewhere  classified     Problem List Patient Active Problem List  Diagnosis Date Noted  . Non-Obstructive CAD 07/31/2019  . Demand ischemia ([place]) 07/31/2019  . Dyslipidemia 07/31/2019  . Acute on chronic systolic CHF (congestive heart failure) ([place]) 07/29/2019  . Persistent atrial fibrillation ([place]) 07/29/2019  . Acute systolic CHF (congestive heart failure) ([place]) 07/29/2019  . Non-cardiac chest pain 11/08/2017  . Asthma with acute exacerbation 10/06/2014  . History of asthma 08/15/2014  . Hemoptysis 08/15/2014  . Chronic cough 08/15/2014  . Cardiomyopathy ([place]) 08/13/2014  . Asthma exacerbation 07/16/2014  . Pleuritic chest pain 07/16/2014  . Asthma, chronic 02/19/2014  . Personal history of colonic adenomas 02/20/2013  . Dyspnea 12/16/2011  . Chest pain with moderate risk for cardiac etiology 12/16/2011  . HIATAL HERNIA 07/11/2006  . OSTEOARTHRITIS 07/11/2006  . ARTHRITIS, HIPS, BILATERAL 07/11/2006    Scot Jun, PT, DPT, OCS, ATC 02/11/20  10:49 AM    [facility] [address] [place] [address] [place], [place], [address] Phone: [phone]   Fax:  [phone]  Name: Marvin [place] MRN: [id number] Date of Birth: 04-May-1950

## 2020-02-18 ENCOUNTER — Encounter: Payer: Medicare Other | Admitting: Rehabilitative and Restorative Service Providers"

## 2020-02-18 ENCOUNTER — Other Ambulatory Visit: Payer: Self-pay

## 2020-02-25 ENCOUNTER — Other Ambulatory Visit: Payer: Self-pay

## 2020-02-25 ENCOUNTER — Encounter: Payer: No Typology Code available for payment source | Admitting: Rehabilitative and Restorative Service Providers"

## 2020-02-25 ENCOUNTER — Ambulatory Visit (INDEPENDENT_AMBULATORY_CARE_PROVIDER_SITE_OTHER): Payer: No Typology Code available for payment source | Admitting: Physical Therapy

## 2020-02-25 DIAGNOSIS — M25552 Pain in left hip: Secondary | ICD-10-CM

## 2020-02-25 DIAGNOSIS — M25551 Pain in right hip: Secondary | ICD-10-CM | POA: Diagnosis not present

## 2020-02-25 DIAGNOSIS — R262 Difficulty in walking, not elsewhere classified: Secondary | ICD-10-CM

## 2020-02-25 DIAGNOSIS — M25652 Stiffness of left hip, not elsewhere classified: Secondary | ICD-10-CM

## 2020-02-25 DIAGNOSIS — M25651 Stiffness of right hip, not elsewhere classified: Secondary | ICD-10-CM | POA: Diagnosis not present

## 2020-02-25 NOTE — Therapy (Signed)
[facility] [address] [place], [place], [address] Phone: [phone]   Fax:  [phone]  Physical Therapy Treatment Re-cert   Patient Details  Name: Marvin Chandler MRN: [id number] Date of Birth: [birth year] Referring Provider (PT): Tarry Kos MD   Encounter Date: 02/25/2020   PT End of Session - 02/25/20 1034    Visit Number 12    Number of Visits 15    Date for PT Re-Evaluation 04/10/20    Authorization - Visit Number 12    Authorization - Number of Visits 15    PT Start Time 1017    PT Stop Time 1056    PT Time Calculation (min) 39 min    Equipment Utilized During Treatment Gait belt    Activity Tolerance Patient tolerated treatment well    Behavior During Therapy WFL for tasks assessed/performed           Past Medical History:  Diagnosis Date  . Arthritis of hip    bilateral  . Asthma   . CAD (coronary artery disease)    LHC 5/16:  Dx ostial 30%, LAD luminal irregs, EF 40%  . Chronic atrial fibrillation (HCC) 07/29/2019  . Chronic systolic CHF (congestive heart failure) (HCC)    a. Echo 3/16:  inf and inf-lat HK, mild LVH, EF 45%, mild to mod LAE, normal RVF  . Cough   . Hiatal hernia   . NICM (nonischemic cardiomyopathy) (HCC)   . Osteoarthritis   . Personal history of colonic adenomas 02/20/2013  . Pneumonia     Past Surgical History:  Procedure Laterality Date  . COLONOSCOPY  02/13/13  . LEFT AND RIGHT HEART CATHETERIZATION WITH CORONARY ANGIOGRAM N/A 08/18/2014   Procedure: LEFT AND RIGHT HEART CATHETERIZATION WITH CORONARY ANGIOGRAM;  Surgeon: Corky Crafts, MD;  Location: [facility] CATH LAB;  Service: Cardiovascular;  Laterality: N/A;  . LEFT HEART CATH AND CORONARY ANGIOGRAPHY N/A 11/09/2017   Procedure: LEFT HEART CATH AND CORONARY ANGIOGRAPHY;  Surgeon: Runell Gess, MD;  Location: [facility] INVASIVE CV LAB;  Service: Cardiovascular;  Laterality: N/A;  . TOTAL HIP ARTHROPLASTY  2002   bilateral    There  were no vitals filed for this visit.   Subjective Assessment - 02/25/20 1030    Subjective Pt reporting 4/10 pain in left lateral hip.    Pertinent History Previous B THAs in 2002    Limitations Walking;Other (comment)    How long can you sit comfortably? 20-25 minutes    How long can you stand comfortably? OK    How long can you walk comfortably? 10-15 yards    Patient Stated Goals Walk better    Currently in Pain? Yes    Pain Score 4     Pain Location Hip    Pain Orientation Left    Pain Descriptors / Indicators Sore    Pain Type Chronic pain    Pain Onset More than a month ago              [facility] PT Assessment - 02/25/20 0001      Assessment   Medical Diagnosis Gluteal tendinopathy    Referring Provider (PT) Tarry Kos MD    Onset Date/Surgical Date 06/15/19      AROM   Overall AROM Comments measured in supine    Right Hip Flexion 118    Left Hip Flexion 102    Left Hip External Rotation  30    Left Hip Internal Rotation  40  Ambulation/Gait   Gait Comments Pt reporting he is walking 3/4 mile with pain at 5-6/10 pain in bilateral hips worsening on left side.                          [facility] PT Treatment/Exercise - 02/25/20 0001      Knee/Hip Exercises: Aerobic   Recumbent Bike L3 x 8 minutes      Knee/Hip Exercises: Machines for Strengthening   Total Gym Leg Press 50 lbs 2 x 15 SL, performed bilateral      Knee/Hip Exercises: Standing   Other Standing Knee Exercises Side Stepping: 20 feet x 4 each dirction      Knee/Hip Exercises: Seated   Sit to Sand 2 sets;10 reps;without UE support   18 inch 10 lbs     Knee/Hip Exercises: Supine   Bridges Both;3 sets;10 reps   blue tband hip abd hold                      PT Long Term Goals - 02/25/20 1044      PT LONG TERM GOAL #1   Title [name] will report B hip pain consistently 0-2/10 on the Numeric Pain Rating Scale.    Status On-going      PT LONG TERM GOAL #2   Title  [name] will be able to walk a mile or more without increasing hip pain at DC.    Status On-going      PT LONG TERM GOAL #3   Title Improve B hip flexors flexibility to 100 degrees and hamstrings to 45 degrees.    Status Partially Met      PT LONG TERM GOAL #4   Title [name] will improve global B hip strength to 4+/5 MMT or better by DC.    Status On-going      PT LONG TERM GOAL #5   Title [name] will be independent and compliant with his final HEP at DC.    Status On-going                 Plan - 02/25/20 1041    Clinical Impression Statement Pt is continuing to make progress with skilled PT. Pt arriving today reporting 4/10 pain in left lateral hip and hip weakness. Pt has improved in his AROM in bilateral hips (see flow sheets).  I am requesting 6 additional visits 1x/week for 6 weeks to continue to progress toward pt's LTG's set at initial evaluation.    Personal Factors and Comorbidities Comorbidity 1    Comorbidities B THAs in 2002    Examination-Activity Limitations Stairs;Bed Mobility;Dressing;Sleep;Locomotion Level    Stability/Clinical Decision Making Stable/Uncomplicated    Rehab Potential Good    PT Frequency Other (comment)    PT Duration 4 weeks    PT Treatment/Interventions ADLs/Self Care Home Management;Stair training;Gait training;Functional mobility training;Therapeutic activities;Therapeutic exercise;Neuromuscular re-education;Patient/family education;Balance training    PT Next Visit Plan Continue strength/balance intervention.    PT Home Exercise Plan Access Code: [email or web address]    Consulted and Agree with Plan of Care Patient           Patient will benefit from skilled therapeutic intervention in order to improve the following deficits and impairments:  Abnormal gait, Decreased activity tolerance, Decreased endurance, Decreased range of motion, Decreased mobility, Decreased strength, Difficulty walking, Hypomobility, Impaired flexibility, Pain  Visit  Diagnosis: Pain in left hip  Pain in right hip  Stiffness of left hip,  not elsewhere classified  Stiffness of right hip, not elsewhere classified  Difficulty in walking, not elsewhere classified     Problem List Patient Active Problem List   Diagnosis Date Noted  . Non-Obstructive CAD 07/31/2019  . Demand ischemia ([place]) 07/31/2019  . Dyslipidemia 07/31/2019  . Acute on chronic systolic CHF (congestive heart failure) ([place]) 07/29/2019  . Persistent atrial fibrillation ([place]) 07/29/2019  . Acute systolic CHF (congestive heart failure) ([place]) 07/29/2019  . Non-cardiac chest pain 11/08/2017  . Asthma with acute exacerbation 10/06/2014  . History of asthma 08/15/2014  . Hemoptysis 08/15/2014  . Chronic cough 08/15/2014  . Cardiomyopathy ([place]) 08/13/2014  . Asthma exacerbation 07/16/2014  . Pleuritic chest pain 07/16/2014  . Asthma, chronic 02/19/2014  . Personal history of colonic adenomas 02/20/2013  . Dyspnea 12/16/2011  . Chest pain with moderate risk for cardiac etiology 12/16/2011  . HIATAL HERNIA 07/11/2006  . OSTEOARTHRITIS 07/11/2006  . ARTHRITIS, HIPS, BILATERAL 07/11/2006    Oretha Caprice, PT, MPT 02/25/2020, 10:57 AM  [facility] [address] [place], [place], [address] Phone: [phone]   Fax:  [phone]  Name: Marvin Chandler MRN: [id number] Date of Birth: 01/13/1951

## 2020-03-03 ENCOUNTER — Other Ambulatory Visit: Payer: Self-pay

## 2020-03-03 ENCOUNTER — Ambulatory Visit (INDEPENDENT_AMBULATORY_CARE_PROVIDER_SITE_OTHER): Payer: No Typology Code available for payment source | Admitting: Rehabilitative and Restorative Service Providers"

## 2020-03-03 ENCOUNTER — Encounter: Payer: Self-pay | Admitting: Rehabilitative and Restorative Service Providers"

## 2020-03-03 DIAGNOSIS — R262 Difficulty in walking, not elsewhere classified: Secondary | ICD-10-CM

## 2020-03-03 DIAGNOSIS — M25652 Stiffness of left hip, not elsewhere classified: Secondary | ICD-10-CM | POA: Diagnosis not present

## 2020-03-03 DIAGNOSIS — M25651 Stiffness of right hip, not elsewhere classified: Secondary | ICD-10-CM | POA: Diagnosis not present

## 2020-03-03 DIAGNOSIS — M25551 Pain in right hip: Secondary | ICD-10-CM | POA: Diagnosis not present

## 2020-03-03 DIAGNOSIS — M25552 Pain in left hip: Secondary | ICD-10-CM | POA: Diagnosis not present

## 2020-03-03 NOTE — Therapy (Addendum)
[facility] [address] [place], [place], [address] Phone: [phone]   Fax:  [phone]  Physical Therapy Treatment/Discharge Patient Details  Name: Marvin Chandler MRN: [id number] Date of Birth: Oct 02, 1950 Referring Provider (PT): Marvin Koyanagi MD   Encounter Date: 03/03/2020   PT End of Session - 03/03/20 1014    Visit Number 13    Number of Visits 15    Date for PT Re-Evaluation 04/10/20    Authorization Type [facility]    Authorization Time Period Through 03/29/20 per clinical file update    Authorization - Visit Number 13    Authorization - Number of Visits 15    Progress Note Due on Visit 15    PT Start Time 1015    PT Stop Time 1054    PT Time Calculation (min) 39 min    Equipment Utilized During Treatment --    Activity Tolerance Patient tolerated treatment well    Behavior During Therapy Marvin Chandler for tasks assessed/performed           Past Medical History:  Diagnosis Date  . Arthritis of hip    bilateral  . Asthma   . CAD (coronary artery disease)    LHC 5/16:  Dx ostial 30%, LAD luminal irregs, EF 40%  . Chronic atrial fibrillation ([place]) 07/29/2019  . Chronic systolic CHF (congestive heart failure) ([place])    a. Echo 3/16:  inf and inf-lat HK, Marvin Chandler, EF 45%, Marvin to mod LAE, normal RVF  . Cough   . Hiatal hernia   . NICM (nonischemic cardiomyopathy) ([place])   . Osteoarthritis   . Personal history of colonic adenomas 02/20/2013  . Pneumonia     Past Surgical History:  Procedure Laterality Date  . COLONOSCOPY  02/13/13  . LEFT AND RIGHT HEART CATHETERIZATION WITH CORONARY ANGIOGRAM N/A 08/18/2014   Procedure: LEFT AND RIGHT HEART CATHETERIZATION WITH CORONARY ANGIOGRAM;  Surgeon: Marvin Booze, MD;  Location: [facility] CATH LAB;  Service: Cardiovascular;  Laterality: N/A;  . LEFT HEART CATH AND CORONARY ANGIOGRAPHY N/A 11/09/2017   Procedure: LEFT HEART CATH AND CORONARY ANGIOGRAPHY;  Surgeon: Marvin Harp, MD;  Location: [facility] CV LAB;  Service: Cardiovascular;  Laterality: N/A;  . TOTAL HIP ARTHROPLASTY  2002   bilateral    There were no vitals filed for this visit.   Subjective Assessment - 03/03/20 1017    Subjective Pt. indicated slight pain today c some movement in Lt hip.  Pt. stated feeling a few days between visits that his pain was up some in lateral Lt hip, maybe due to "twisting the wrong way."  Improved insidiously over a few days.    Pertinent History Previous B THAs in 2002    Limitations Walking;Other (comment)    How long can you sit comfortably? 20-25 minutes    How long can you stand comfortably? OK    How long can you walk comfortably? 10-15 yards    Patient Stated Goals Walk better    Currently in Pain? Yes    Pain Score --   Marvin slight pain reported   Pain Location Hip    Pain Orientation Left    Pain Descriptors / Indicators Sore    Pain Onset More than a month ago    Pain Frequency Intermittent    Aggravating Factors  twisting the wrong way one time since visit    Pain Relieving Factors stretching  [facility] Adult PT Treatment/Exercise - 03/03/20 0001      Knee/Hip Exercises: Aerobic   Nustep Lvl 6 10 mins      Knee/Hip Exercises: Machines for Strengthening   Total Gym Leg Press 50 lbs 3x 10 Sl performed bilateral      Knee/Hip Exercises: Standing   Lateral Step Up 2 sets;15 reps;Both;Step Height: 6"      Knee/Hip Exercises: Supine   Bridges 15 reps;Strengthening   5 sec hold     Knee/Hip Exercises: Sidelying   Hip ADduction Strengthening;Both;10 reps;2 sets                  PT Education - 03/03/20 1024    Education Details HEP review, cues for upcoming d/c planning, education of symptom management.    Person(s) Educated Patient    Methods Explanation;Verbal cues    Comprehension Verbalized understanding;Returned demonstration               PT Long Term Goals - 02/25/20 1044      PT LONG TERM GOAL  #1   Title Marvin Chandler will report B hip pain consistently 0-2/10 on the Numeric Pain Rating Scale.    Status On-going      PT LONG TERM GOAL #2   Title Marvin Chandler will be able to walk a mile or more without increasing hip pain at DC.    Status On-going      PT LONG TERM GOAL #3   Title Improve B hip flexors flexibility to 100 degrees and hamstrings to 45 degrees.    Status Partially Met      PT LONG TERM GOAL #4   Title Marvin Chandler will improve global B hip strength to 4+/5 MMT or better by DC.    Status On-going      PT LONG TERM GOAL #5   Title Marvin Chandler will be independent and compliant with his final HEP at DC.    Status On-going                 Plan - 03/03/20 1040    Clinical Impression Statement Pt. is approaching d/c to HEP as symptoms management has improved and good knowledge of HEP has been demonstrated c occasional cues required.  Pt. to benefit from continued skilled PT services c 2 visits remaining on [place] approval by 03/29/2020.    Personal Factors and Comorbidities Comorbidity 1    Comorbidities B THAs in 2002    Examination-Activity Limitations Stairs;Bed Mobility;Dressing;Sleep;Locomotion Level    Stability/Clinical Decision Making Stable/Uncomplicated    Rehab Potential Good    PT Frequency Other (comment)    PT Duration 4 weeks    PT Treatment/Interventions ADLs/Self Care Home Management;Stair training;Gait training;Functional mobility training;Therapeutic activities;Therapeutic exercise;Neuromuscular re-education;Patient/family education;Balance training    PT Next Visit Plan Continue strength/balance intervention, prepare for d/c to HEP    PT Home Exercise Plan Access Code: [email or web address]    Consulted and Agree with Plan of Care Patient           Patient will benefit from skilled therapeutic intervention in order to improve the following deficits and impairments:  Abnormal gait, Decreased activity tolerance, Decreased endurance, Decreased range of motion, Decreased  mobility, Decreased strength, Difficulty walking, Hypomobility, Impaired flexibility, Pain  Visit Diagnosis: Pain in left hip  Pain in right hip  Stiffness of left hip, not elsewhere classified  Stiffness of right hip, not elsewhere classified  Difficulty in walking, not elsewhere classified     Problem List Patient Active Problem  List   Diagnosis Date Noted  . Non-Obstructive CAD 07/31/2019  . Demand ischemia ([place]) 07/31/2019  . Dyslipidemia 07/31/2019  . Acute on chronic systolic CHF (congestive heart failure) ([place]) 07/29/2019  . Persistent atrial fibrillation ([place]) 07/29/2019  . Acute systolic CHF (congestive heart failure) ([place]) 07/29/2019  . Non-cardiac chest pain 11/08/2017  . Asthma with acute exacerbation 10/06/2014  . History of asthma 08/15/2014  . Hemoptysis 08/15/2014  . Chronic cough 08/15/2014  . Cardiomyopathy ([place]) 08/13/2014  . Asthma exacerbation 07/16/2014  . Pleuritic chest pain 07/16/2014  . Asthma, chronic 02/19/2014  . Personal history of colonic adenomas 02/20/2013  . Dyspnea 12/16/2011  . Chest pain with moderate risk for cardiac etiology 12/16/2011  . HIATAL HERNIA 07/11/2006  . OSTEOARTHRITIS 07/11/2006  . ARTHRITIS, HIPS, BILATERAL 07/11/2006    Scot Jun, PT, DPT, OCS, ATC 03/03/20  10:44 AM   PHYSICAL THERAPY DISCHARGE SUMMARY  Visits from Start of Care: 13  Current functional level related to goals / functional outcomes: See note   Remaining deficits: See note   Education / Equipment: HEP Plan: Patient agrees to discharge.  Patient goals were partially met. Patient is being discharged due to being pleased with the current functional level.  ?????    Scot Jun, PT, DPT, OCS, ATC 04/20/20  2:10 PM      [facility] [address] [place], [place], [address] Phone: [phone]   Fax:  [phone]  Name: Marvin Chandler MRN: [id number] Date of Birth: [birth year]

## 2020-06-04 ENCOUNTER — Other Ambulatory Visit (HOSPITAL_BASED_OUTPATIENT_CLINIC_OR_DEPARTMENT_OTHER): Payer: Self-pay

## 2020-06-04 DIAGNOSIS — G4733 Obstructive sleep apnea (adult) (pediatric): Secondary | ICD-10-CM

## 2020-07-16 ENCOUNTER — Other Ambulatory Visit: Payer: Self-pay

## 2020-07-16 ENCOUNTER — Ambulatory Visit (HOSPITAL_BASED_OUTPATIENT_CLINIC_OR_DEPARTMENT_OTHER): Payer: No Typology Code available for payment source | Attending: Internal Medicine | Admitting: Internal Medicine

## 2020-07-16 DIAGNOSIS — G4733 Obstructive sleep apnea (adult) (pediatric): Secondary | ICD-10-CM | POA: Diagnosis present

## 2020-07-18 DIAGNOSIS — G4733 Obstructive sleep apnea (adult) (pediatric): Secondary | ICD-10-CM | POA: Diagnosis not present

## 2020-07-18 NOTE — Procedures (Signed)
    Patient Name: Marvin Chandler, Marvin Chandler Date: 07/16/2020 Gender: Male D.O.B: October 16, 1950 Age (years): 22 Referring Provider: Lorenso Courier Humphrey Height (inches): 71 Interpreting Physician: Baird Lyons MD, ABSM Weight (lbs): 218 RPSGT: Laren Everts BMI: 30 MRN: [id number] Neck Size: 17.50  CLINICAL INFORMATION Sleep Study Type: NPSG  Indication for sleep study: Congestive Heart Failure, Diabetes, Excessive Daytime Sleepiness, Fatigue, Hypertension, Obesity, Sleep walking/talking/parasomnias, Snoring Epworth Sleepiness Score: 8  SLEEP STUDY TECHNIQUE As per the AASM Manual for the Scoring of Sleep and Associated Events v2.3 (April 2016) with a hypopnea requiring 4% desaturations.  The channels recorded and monitored were frontal, central and occipital EEG, electrooculogram (EOG), submentalis EMG (chin), nasal and oral airflow, thoracic and abdominal wall motion, anterior tibialis EMG, snore microphone, electrocardiogram, and pulse oximetry.  MEDICATIONS Medications self-administered by patient taken the night of the study : FUROSEMIDE, Sacubitril/ Valsartan, MONTELUKAST, ATORVASTATIN  SLEEP ARCHITECTURE The study was initiated at 10:17:16 PM and ended at 5:11:28 AM.  Sleep onset time was 16.4 minutes and the sleep efficiency was 72.8%%. The total sleep time was 301.5 minutes.  Stage REM latency was 77.0 minutes.  The patient spent 10.9%% of the night in stage N1 sleep, 57.2%% in stage N2 sleep, 0.0%% in stage N3 and 31.8% in REM.  Alpha intrusion was absent.  Supine sleep was 13.10%.  RESPIRATORY PARAMETERS The overall apnea/hypopnea index (AHI) was 10.1 per hour. There were 0 total apneas, including 0 obstructive, 0 central and 0 mixed apneas. There were 51 hypopneas and 44 RERAs.  The AHI during Stage REM sleep was 24.4 per hour.  AHI while supine was 7.6 per hour.  The mean oxygen saturation was 94.5%. The minimum SpO2 during sleep was 84.0%.  moderate snoring was  noted during this study.  CARDIAC DATA The 2 lead EKG demonstrated sinus rhythm. The mean heart rate was 58.6 beats per minute. Other EKG findings include: PVCs.  LEG MOVEMENT DATA The total PLMS were 0 with a resulting PLMS index of 0.0. Associated arousal with leg movement index was 0.0 .  IMPRESSIONS - Mild obstructive sleep apnea occurred during this study (AHI = 10.1/h). - Mild oxygen desaturation was noted during this study (Min O2 = 84.0%). Mean O2 saturation 94.5%. - The patient snored with moderate snoring volume. - EKG findings include PVCs. - Clinically significant periodic limb movements did not occur during sleep.  DIAGNOSIS - Obstructive Sleep Apnea (G47.33)  RECOMMENDATIONS - Treatment for mild OSA is directed by symptoms and co-morbidities. Conservative measures include observation, weight loss and sleep positioon off back. Other options,including return for CPAP titration, autopap, a fitted oral appliance or ENT evaluation, would be based on clinical judgment. - Be careful with alcohol, sedatives and other CNS depressants that may worsen sleep apnea and disrupt normal sleep architecture. - Sleep hygiene should be reviewed to assess factors that may improve sleep quality. - Weight management and regular exercise should be initiated or continued if appropriate.  [Electronically signed] 07/18/2020 10:52 AM  Baird Lyons MD, ABSM Diplomate, American Board of Sleep Medicine   NPI: [id number]                         [facility]  ELECTRONICALLY SIGNED ON:  07/18/2020, 10:47 AM [place] PH: [phone]   FX: [phone] [place]

## 2020-08-09 ENCOUNTER — Emergency Department (HOSPITAL_BASED_OUTPATIENT_CLINIC_OR_DEPARTMENT_OTHER)
Admission: EM | Admit: 2020-08-09 | Discharge: 2020-08-09 | Disposition: A | Discharge status: Home / Self Care | Payer: No Typology Code available for payment source | Attending: Emergency Medicine | Admitting: Emergency Medicine

## 2020-08-09 ENCOUNTER — Encounter (HOSPITAL_BASED_OUTPATIENT_CLINIC_OR_DEPARTMENT_OTHER): Payer: Self-pay | Admitting: Emergency Medicine

## 2020-08-09 ENCOUNTER — Emergency Department (HOSPITAL_BASED_OUTPATIENT_CLINIC_OR_DEPARTMENT_OTHER): Payer: No Typology Code available for payment source

## 2020-08-09 ENCOUNTER — Other Ambulatory Visit: Payer: Self-pay

## 2020-08-09 DIAGNOSIS — Z96643 Presence of artificial hip joint, bilateral: Secondary | ICD-10-CM | POA: Insufficient documentation

## 2020-08-09 DIAGNOSIS — M25512 Pain in left shoulder: Secondary | ICD-10-CM | POA: Insufficient documentation

## 2020-08-09 DIAGNOSIS — M542 Cervicalgia: Secondary | ICD-10-CM | POA: Diagnosis not present

## 2020-08-09 DIAGNOSIS — Y92410 Unspecified street and highway as the place of occurrence of the external cause: Secondary | ICD-10-CM | POA: Diagnosis not present

## 2020-08-09 DIAGNOSIS — S0990XA Unspecified injury of head, initial encounter: Secondary | ICD-10-CM | POA: Diagnosis not present

## 2020-08-09 DIAGNOSIS — I5023 Acute on chronic systolic (congestive) heart failure: Secondary | ICD-10-CM | POA: Insufficient documentation

## 2020-08-09 DIAGNOSIS — I2510 Atherosclerotic heart disease of native coronary artery without angina pectoris: Secondary | ICD-10-CM | POA: Insufficient documentation

## 2020-08-09 DIAGNOSIS — M79602 Pain in left arm: Secondary | ICD-10-CM | POA: Insufficient documentation

## 2020-08-09 DIAGNOSIS — J45901 Unspecified asthma with (acute) exacerbation: Secondary | ICD-10-CM | POA: Insufficient documentation

## 2020-08-09 DIAGNOSIS — Z951 Presence of aortocoronary bypass graft: Secondary | ICD-10-CM | POA: Insufficient documentation

## 2020-08-09 DIAGNOSIS — Z87891 Personal history of nicotine dependence: Secondary | ICD-10-CM | POA: Diagnosis not present

## 2020-08-09 DIAGNOSIS — Z7952 Long term (current) use of systemic steroids: Secondary | ICD-10-CM | POA: Insufficient documentation

## 2020-08-09 DIAGNOSIS — M546 Pain in thoracic spine: Secondary | ICD-10-CM | POA: Diagnosis not present

## 2020-08-09 MED ORDER — HYDROCODONE-ACETAMINOPHEN 5-325 MG PO TABS
1.0000 | ORAL_TABLET | Freq: Once | ORAL | Status: AC
Start: 2020-08-09 — End: 2020-08-09
  Administered 2020-08-09: 1 via ORAL
  Filled 2020-08-09: qty 1

## 2020-08-09 MED ORDER — CYCLOBENZAPRINE HCL 10 MG PO TABS: 5.0000 mg | ORAL_TABLET | Freq: Two times a day (BID) | ORAL | 0 refills | Status: DC | PRN

## 2020-08-09 MED ORDER — ACETAMINOPHEN-CODEINE #3 300-30 MG PO TABS: 1.0000 | ORAL_TABLET | Freq: Four times a day (QID) | ORAL | 0 refills | Status: DC | PRN
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
## 2020-08-09 NOTE — Discharge Instructions (Addendum)
Your images showed no abnormalities.  Return to the emergency department immediately if you develop any of the following symptoms: You have numbness, tingling, or weakness in the arms or legs. You develop severe headaches not relieved with medicine. You have severe neck pain, especially tenderness in the middle of the back of your neck. You have changes in bowel or bladder control. There is increasing pain in any area of the body. You have shortness of breath, light-headedness, dizziness, or fainting. You have chest pain. You feel sick to your stomach (nauseous), throw up (vomit), or sweat. You have increasing abdominal discomfort. There is blood in your urine, stool, or vomit. You have pain in your shoulder (shoulder strap areas). You feel your symptoms are getting worse.
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
## 2020-09-22 NOTE — Progress Notes (Signed)
Cardiology Office Note   Date:  09/25/2020   ID:  DRESHAWN HENDERSHOTT, DOB July 03, 1950, MRN [id number]  PCP:  [facility]  Cardiologist:  Dr. Angelena Form     Chief Complaint  Patient presents with  . Coronary Artery Disease  . Cardiomyopathy      History of Present Illness: Marvin Chandler is a 70 y.o. male who presents for CAD.  Last seen 09/25/19.   He has a history ofnon-obstructive CAD on cardiac cath in 12/3816, chronic systolic CHF/ nonischemic cardiomyopathy, atrial fibrillation on xarelto, AV block so not on AV nodal blocking agents, and recent COVID infection in 05/2019 whois followed by Dr. Angelena Form.He was last seen by Dr. Angelena Form on 06/14/2019 at which time he was in atrial fibrillation, with rates in the 90's, but was doing well from a cardiac standpoint.  Patient was discharged from the hospital 07/31/2019 after being treated for acute on chronic systolic CHF. Echo LVEF 25 to 30% with global hypokinesis and severe akinesis of the entire inferior and inferoseptal wall. Down from EF 35 to 40% on echo in 10/2017. Wall motion abnormalities in the inferior wall were noted before. He was started on Entresto Imdur and spironolactone. He diuresed 3.8 L and was discharged at a weight of 213.6 pounds. Remained in atrial fibrillation rates 80-100 with a few short runs of NSVT asymptomatic. He was maintained on Xarelto bilirubin was also elevated and he was asked to follow-up with PCP.    Review of EP note from [facility] with recent zio and 2 pauses/[place], placed on amiodarone and plan for DCCV 01/2020 when he came in was in [place].  Continues on Eliquis and now amiodarone 200 daily.  NICM wit h EF 20-25%, 12/2019 EF was 35-40% on entresto 97/103  -  Was to have follow up echo will need   Pt is noted to have PVI on last EP visit.    Today no chest pain and no SOB unless he walks up a flight of steps then SOB at top.  Marland Kitchen  He is I a fib today rate controlled.  Our med  list was very different from [place] list - we reviewed and adjusted our list.     Past Medical History:  Diagnosis Date  . Arthritis of hip    bilateral  . Asthma   . CAD (coronary artery disease)    LHC 5/16:  Dx ostial 30%, LAD luminal irregs, EF 40%  . Chronic atrial fibrillation ([place]) 07/29/2019  . Chronic systolic CHF (congestive heart failure) ([place])    a. Echo 3/16:  inf and inf-lat HK, mild LVH, EF 45%, mild to mod LAE, normal RVF  . Cough   . Hiatal hernia   . NICM (nonischemic cardiomyopathy) ([place])   . Osteoarthritis   . Personal history of colonic adenomas 02/20/2013  . Pneumonia     Past Surgical History:  Procedure Laterality Date  . COLONOSCOPY  02/13/13  . LEFT AND RIGHT HEART CATHETERIZATION WITH CORONARY ANGIOGRAM N/A 08/18/2014   Procedure: LEFT AND RIGHT HEART CATHETERIZATION WITH CORONARY ANGIOGRAM;  Surgeon: [name], MD;  Location: [facility] CATH LAB;  Service: Cardiovascular;  Laterality: N/A;  . LEFT HEART CATH AND CORONARY ANGIOGRAPHY N/A 11/09/2017   Procedure: LEFT HEART CATH AND CORONARY ANGIOGRAPHY;  Surgeon: [name], MD;  Location: [facility] CV LAB;  Service: Cardiovascular;  Laterality: N/A;  . TOTAL HIP ARTHROPLASTY  2002   bilateral     Current Outpatient Medications  Medication Sig Dispense Refill  . albuterol (PROVENTIL) (2.5 MG/3ML) 0.083% nebulizer solution Take 3 mLs (2.5 mg total) by nebulization every 6 (six) hours as needed for wheezing or shortness of breath. 300 mL 5  . albuterol (VENTOLIN HFA) 108 (90 Base) MCG/ACT inhaler Inhale 2 puffs into the lungs every 4 (four) hours as needed. 18 g 6  . amiodarone (PACERONE) 200 MG tablet Take 200 mg by mouth daily.    Marland Kitchen atorvastatin (LIPITOR) 80 MG tablet Take 80 mg by mouth daily.    . cetirizine (ZYRTEC) 10 MG tablet Take 10 mg by mouth daily.    . cyclobenzaprine (FLEXERIL) 10 MG tablet Take 0.5-1 tablets (5-10 mg total) by mouth 2 (two) times daily as needed for muscle spasms. 20  tablet 0  . empagliflozin (JARDIANCE) 25 MG TABS tablet Take 0.5 tablets by mouth daily.    Marland Kitchen EPINEPHrine 0.3 mg/0.3 mL IJ SOAJ injection Inject 0.3 mLs (0.3 mg total) into the muscle once. 1 Device 11  . fluticasone (FLONASE) 50 MCG/ACT nasal spray Place 2 sprays into both nostrils daily. 16 g 5  . furosemide (LASIX) 40 MG tablet Take 40 mg by mouth 2 (two) times daily.    Marland Kitchen gabapentin (NEURONTIN) 300 MG capsule Take 1 capsule by mouth at bedtime.    . isosorbide mononitrate (IMDUR) 30 MG 24 hr tablet Take 1 tablet (30 mg total) by mouth daily. 30 tablet 2  . metFORMIN (GLUCOPHAGE) 500 MG tablet Take 500 mg by mouth 2 (two) times daily with a meal.    . montelukast (SINGULAIR) 10 MG tablet TAKE 1 TABLET BY MOUTH AT BEDTIME 30 tablet 30  . Nebulizers MISC by Does not apply route 4 (four) times daily. Use with nebulizer four times a day as needed for wheezing or sob    . nitroGLYCERIN (NITROSTAT) 0.4 MG SL tablet Place 1 tablet (0.4 mg total) under the tongue every 5 (five) minutes as needed. 25 tablet 3  . rivaroxaban (XARELTO) 20 MG TABS tablet Take 1 tablet (20 mg total) by mouth daily with supper. 30 tablet 6  . sacubitril-valsartan (ENTRESTO) 97-103 MG Take 1 tablet by mouth 2 (two) times daily. 60 tablet 11  . SPIRIVA HANDIHALER 18 MCG inhalation capsule INHALE THE CONTENTS OF 1 CAPSULE VIA HANDIHALER BY MOUTH DAILY 30 capsule 3  . spironolactone (ALDACTONE) 25 MG tablet Take 0.5 tablets (12.5 mg total) by mouth daily. 15 tablet 2  . terbinafine (LAMISIL) 1 % cream Apply 1 application topically as needed.     No current facility-administered medications for this visit.  also on gabapentin, and inhalers.   Allergies:   Patient has no known allergies.    Social History:  The patient  reports that he quit smoking about 22 years ago. His smoking use included cigarettes. He has a 7.00 pack-year smoking history. He has never used smokeless tobacco. He reports that he does not drink alcohol and  does not use drugs.   Family History:  The patient's family history includes Cancer in his maternal aunt and another family member; Dementia in his mother; Diabetes in his cousin; Stroke in his cousin.    ROS:  General:no colds or fevers, no weight changes Skin:no rashes or ulcers HEENT:no blurred vision, no congestion CV:see HPI PUL:see HPI  Hx of asthma GI:no diarrhea constipation or melena, hx GERD GU:no hematuria, no dysuria MS:no joint pain, no claudication Neuro:no syncope, no lightheadedness Endo:+ diabetes, no thyroid disease  Wt Readings from Last 3  Encounters:  09/25/20 239 lb (108.4 kg)  08/09/20 220 lb (99.8 kg)  07/16/20 218 lb (98.9 kg)     PHYSICAL EXAM: VS:  BP 132/72   Pulse 64   Ht 5\' 11"  (1.803 m)   Wt 239 lb (108.4 kg)   BMI 33.33 kg/m  , BMI Body mass index is 33.33 kg/m. General:Pleasant affect, NAD Skin:Warm and dry, brisk capillary refill HEENT:normocephalic, sclera clear, mucus membranes moist Neck:supple, no JVD, no bruits  Heart:irreg irreg without murmur, gallup, rub or click Lungs:clear without rales, rhonchi, or wheezes IWL:NLGX, non tender, + BS, do not palpate liver spleen or masses Ext:no lower ext edema, 2+ pedal pulses, 2+ radial pulses Neuro:alert and oriented X 3, MAE, follows commands, + facial symmetry    EKG:  EKG is ordered today. The ekg ordered today demonstrates   atrial fib at 64 no acute ST changes In April per ep note was in SR with 1st degree AV block   Recent Labs: No results found for requested labs within last 8760 hours.    Lipid Panel    Component Value Date/Time   CHOL 75 (L) 06/14/2019 0830   TRIG 52 06/14/2019 0830   HDL 30 (L) 06/14/2019 0830   CHOLHDL 2.5 06/14/2019 0830   CHOLHDL 3.8 11/09/2017 0321   VLDL 19 11/09/2017 0321   LDLCALC 32 06/14/2019 0830       Other studies Reviewed: Additional studies/ records that were reviewed today include: . Echocardiogram 3/29/202021: Impressions: 1.  Left ventricular ejection fraction, by estimation, is 25 to 30%. The  left ventricle has severely decreased function. The left ventricle  demonstrates global hypokinesis. Left ventricular diastolic function could  not be evaluated. There is severe  akinesis of the left ventricular, entire inferior wall and inferoseptal  wall.  2. Right ventricular systolic function was not well visualized. The right  ventricular size is not well visualized.  3. Left atrial size was mildly dilated.  4. The mitral valve is grossly normal. Trivial mitral valve  regurgitation.  5. The aortic valve is tricuspid. Aortic valve regurgitation is trivial.  Mild aortic valve sclerosis is present, with no evidence of aortic valve  stenosis.  6. The inferior vena cava is normal in size with <50% respiratory  variability, suggesting right atrial pressure of 8 mmHg.   Comparison(s): Changes from prior study are noted. 10/31/2017: LVEF 35-40%,  global hypokinesis, worse inferiorly.  _____________  Echo 12/2019 with improved EF 35-40%  ASSESSMENT AND PLAN:  1.  PAF now on amiodarone per EP at [facility] with plan for PVI.  On xarelto, he takes daily and has not missed any doses.  No bleeding.  Will check echo, today he is in a fib with rate controlled.  If EF dropped again will send to [facility] for [facility]VI, I do not see scheduled.  -hx of AV block and no BB [facility].   2.  Cardiomyopathy -NICM with improved Ef in August 12/2019, will recheck echo  On entresto 97/103 and spironolactone along with imdur and lasix.    3.  Chronic systolic CHF euvolemic today.   4.  HLD on statin will check CMP, and lipids today pt is fasting.   5.  OSA sleep study with mild sleep apnea, no CPAP.      Current medicines are reviewed with the patient today.  The patient Has no concerns regarding medicines.  The following changes have been made:  See above Labs/ tests ordered today include:see above  Disposition:   FU:  see  above  Signed, Cecilie Kicks, NP  09/25/2020 3:52 PM    [facility] [place], [place], [place] Phone: [phone]; Fax: [phone]

## 2020-09-25 ENCOUNTER — Other Ambulatory Visit: Payer: Self-pay

## 2020-09-25 ENCOUNTER — Encounter: Payer: Self-pay | Admitting: Cardiology

## 2020-09-25 ENCOUNTER — Ambulatory Visit (INDEPENDENT_AMBULATORY_CARE_PROVIDER_SITE_OTHER): Payer: No Typology Code available for payment source | Admitting: Cardiology

## 2020-09-25 VITALS — BP 132/72 | HR 64 | Ht 71.0 in | Wt 239.0 lb

## 2020-09-25 DIAGNOSIS — I5022 Chronic systolic (congestive) heart failure: Secondary | ICD-10-CM

## 2020-09-25 DIAGNOSIS — Z79899 Other long term (current) drug therapy: Secondary | ICD-10-CM | POA: Diagnosis not present

## 2020-09-25 DIAGNOSIS — I4819 Other persistent atrial fibrillation: Secondary | ICD-10-CM

## 2020-09-25 DIAGNOSIS — I429 Cardiomyopathy, unspecified: Secondary | ICD-10-CM | POA: Diagnosis not present

## 2020-09-25 LAB — COMPREHENSIVE METABOLIC PANEL
ALT: 17 IU/L (ref 0–44)
AST: 19 IU/L (ref 0–40)
Albumin/Globulin Ratio: 2 (ref 1.2–2.2)
Albumin: 4.3 g/dL (ref 3.7–4.7)
Alkaline Phosphatase: 96 IU/L (ref 44–121)
BUN/Creatinine Ratio: 11 (ref 10–24)
BUN: 17 mg/dL (ref 8–27)
Bilirubin Total: 0.5 mg/dL (ref 0.0–1.2)
CO2: 29 mmol/L (ref 20–29)
Calcium: 9.6 mg/dL (ref 8.6–10.2)
Chloride: 100 mmol/L (ref 96–106)
Creatinine, Ser: 1.54 mg/dL — ABNORMAL HIGH (ref 0.76–1.27)
Globulin, Total: 2.2 g/dL (ref 1.5–4.5)
Glucose: 112 mg/dL — ABNORMAL HIGH (ref 65–99)
Potassium: 4.3 mmol/L (ref 3.5–5.2)
Sodium: 142 mmol/L (ref 134–144)
Total Protein: 6.5 g/dL (ref 6.0–8.5)
eGFR: 48 mL/min/{1.73_m2} — ABNORMAL LOW (ref >59)

## 2020-09-25 LAB — CBC
Hematocrit: 47.6 % (ref 37.5–51.0)
Hemoglobin: 15.5 g/dL (ref 13.0–17.7)
MCH: 29.1 pg (ref 26.6–33.0)
MCHC: 32.6 g/dL (ref 31.5–35.7)
MCV: 90 fL (ref 79–97)
Platelets: 225 10*3/uL (ref 150–450)
RBC: 5.32 x10E6/uL (ref 4.14–5.80)
RDW: 13.5 % (ref 11.6–15.4)
WBC: 7.1 10*3/uL (ref 3.4–10.8)

## 2020-09-25 LAB — LIPID PANEL
Chol/HDL Ratio: 2.9 ratio (ref 0.0–5.0)
Cholesterol, Total: 112 mg/dL (ref 100–199)
HDL: 39 mg/dL — ABNORMAL LOW (ref >39)
LDL Chol Calc (NIH): 59 mg/dL (ref 0–99)
Triglycerides: 63 mg/dL (ref 0–149)
VLDL Cholesterol Cal: 14 mg/dL (ref 5–40)

## 2020-09-25 NOTE — Patient Instructions (Addendum)
Medication Instructions:  Your physician recommends that you continue on your current medications as directed. Please refer to the Current Medication list given to you today.  *If you need a refill on your cardiac medications before your next appointment, please call your pharmacy*   Lab Work: Lipids, Cbc, Cmp- Today  If you have labs (blood work) drawn today and your tests are completely normal, you will receive your results only by: Marland Kitchen MyChart Message (if you have MyChart) OR . A paper copy in the mail If you have any lab test that is abnormal or we need to change your treatment, we will call you to review the results.   Testing/Procedures: Your physician has requested that you have an echocardiogram. Echocardiography is a painless test that uses sound waves to create images of your heart. It provides your doctor with information about the size and shape of your heart and how well your heart's chambers and valves are working. This procedure takes approximately one hour. There are no restrictions for this procedure.     Follow-Up: At [facility], you and your health needs are our priority.  As part of our continuing mission to provide you with exceptional heart care, we have created designated Provider Care Teams.  These Care Teams include your primary Cardiologist (physician) and Advanced Practice Providers (APPs -  Physician Assistants and Nurse Practitioners) who all work together to provide you with the care you need, when you need it.  We recommend signing up for the patient portal called "MyChart".  Sign up information is provided on this After Visit Summary.  MyChart is used to connect with patients for Virtual Visits (Telemedicine).  Patients are able to view lab/test results, encounter notes, upcoming appointments, etc.  Non-urgent messages can be sent to your provider as well.   To learn more about what you can do with MyChart, go to [email or web address].    Your next  appointment:   6 month(s)  The format for your next appointment:   In Person  Provider:   You may see Lauree Chandler, MD or one of the following Advanced Practice Providers on your designated Care Team:    Melina Copa, PA-C  Ermalinda Barrios, PA-C    Other Instructions None

## 2020-09-30 ENCOUNTER — Telehealth: Payer: Self-pay | Admitting: Cardiology

## 2020-09-30 NOTE — Telephone Encounter (Signed)
Called pt to clarify PVI but he is not sure when that will be done.  He will call us back to let us know.

## 2020-10-21 ENCOUNTER — Telehealth: Payer: Self-pay | Admitting: Cardiology

## 2020-10-21 NOTE — Telephone Encounter (Signed)
[place] Would like records from the patient's OV 09/25/20  Fax: [phone]

## 2020-10-22 ENCOUNTER — Ambulatory Visit (HOSPITAL_COMMUNITY): Payer: Medicare Other | Attending: Cardiology

## 2020-10-22 ENCOUNTER — Other Ambulatory Visit: Payer: Self-pay

## 2020-10-22 DIAGNOSIS — I358 Other nonrheumatic aortic valve disorders: Secondary | ICD-10-CM | POA: Diagnosis not present

## 2020-10-22 DIAGNOSIS — I361 Nonrheumatic tricuspid (valve) insufficiency: Secondary | ICD-10-CM

## 2020-10-22 DIAGNOSIS — I351 Nonrheumatic aortic (valve) insufficiency: Secondary | ICD-10-CM | POA: Diagnosis not present

## 2020-10-22 DIAGNOSIS — I2510 Atherosclerotic heart disease of native coronary artery without angina pectoris: Secondary | ICD-10-CM | POA: Insufficient documentation

## 2020-10-22 DIAGNOSIS — I340 Nonrheumatic mitral (valve) insufficiency: Secondary | ICD-10-CM

## 2020-10-22 DIAGNOSIS — I428 Other cardiomyopathies: Secondary | ICD-10-CM | POA: Insufficient documentation

## 2020-10-22 DIAGNOSIS — I5022 Chronic systolic (congestive) heart failure: Secondary | ICD-10-CM | POA: Insufficient documentation

## 2020-10-22 DIAGNOSIS — I429 Cardiomyopathy, unspecified: Secondary | ICD-10-CM

## 2020-10-22 DIAGNOSIS — I4891 Unspecified atrial fibrillation: Secondary | ICD-10-CM | POA: Insufficient documentation

## 2020-10-23 LAB — ECHOCARDIOGRAM COMPLETE
AR max vel: 2.12 cm2
AV Area VTI: 2.23 cm2
AV Area mean vel: 2.06 cm2
AV Mean grad: 4 mmHg
AV Peak grad: 8.4 mmHg
Ao pk vel: 1.45 m/s
Area-P 1/2: 3.85 cm2
S' Lateral: 4.2 cm

## 2020-10-26 ENCOUNTER — Other Ambulatory Visit (HOSPITAL_COMMUNITY): Payer: No Typology Code available for payment source

## 2020-12-01 ENCOUNTER — Encounter: Payer: Self-pay | Admitting: Cardiology

## 2020-12-01 ENCOUNTER — Other Ambulatory Visit: Payer: Self-pay

## 2020-12-01 ENCOUNTER — Ambulatory Visit (INDEPENDENT_AMBULATORY_CARE_PROVIDER_SITE_OTHER): Payer: No Typology Code available for payment source | Admitting: Cardiology

## 2020-12-01 VITALS — BP 124/74 | HR 59 | Ht 70.5 in | Wt 232.0 lb

## 2020-12-01 DIAGNOSIS — I4819 Other persistent atrial fibrillation: Secondary | ICD-10-CM

## 2020-12-01 NOTE — Patient Instructions (Addendum)
Medication Instructions:  Your physician recommends that you continue on your current medications as directed. Please refer to the Current Medication list given to you today.  *If you need a refill on your cardiac medications before your next appointment, please call your pharmacy*   Lab Work: Pre procedure labs _______________:  BMP & CBC.   You do NOT need to be fasting.  You can stop by the [employer] office anytime between 7:30 am - 4:30 pm  If you have labs (blood work) drawn today and your tests are completely normal, you will receive your results only by: [place] (if you have MyChart) OR A paper copy in the mail If you have any lab test that is abnormal or we need to change your treatment, we will call you to review the results.   Testing/Procedures: Your physician has requested that you have cardiac CT within 7 days PRIOR to your ablation. Cardiac computed tomography (CT) is a painless test that uses an x-ray machine to take clear, detailed pictures of your heart.  Please follow instruction below located under "other instructions". You will get a call from our office to schedule the date for this test.  Your physician has recommended that you have an ablation. Catheter ablation is a medical procedure used to treat some cardiac arrhythmias (irregular heartbeats). During catheter ablation, a long, thin, flexible tube is put into a blood vessel in your groin (upper thigh), or neck. This tube is called an ablation catheter. It is then guided to your heart through the blood vessel. Radio frequency waves destroy small areas of heart tissue where abnormal heartbeats may cause an arrhythmia to start. Please follow instruction below located under "other instructions".  The following are available dates (these are subject to change): 9/16, 9/21, 9/22, 9/28, 9/29, 9/30, 10/5, 10/7, 10/12, 10/14  Follow-Up: At [facility], you and your health needs are our priority.  As part of our  continuing mission to provide you with exceptional heart care, we have created designated Provider Care Teams.  These Care Teams include your primary Cardiologist (physician) and Advanced Practice Providers (APPs -  Physician Assistants and Nurse Practitioners) who all work together to provide you with the care you need, when you need it.  We recommend signing up for the patient portal called "MyChart".  Sign up information is provided on this After Visit Summary.  MyChart is used to connect with patients for Virtual Visits (Telemedicine).  Patients are able to view lab/test results, encounter notes, upcoming appointments, etc.  Non-urgent messages can be sent to your provider as well.   To learn more about what you can do with MyChart, go to [email or web address].    Your next appointment:   1 month(s) after your ablation  The format for your next appointment:   In Person  Provider:   AFib clinic   Thank you for choosing [facility]!!   Trinidad Curet, RN [phone]    Other Instructions   CT INSTRUCTIONS Your cardiac CT will be scheduled at:  [facility] [address] [place], [place] [address] [phone]  Please arrive at the [facility] main entrance (entrance A) of [facility] 30 minutes prior to test start time. Proceed to the [facility] Radiology Department (first floor) to check-in and test prep.   Please follow these instructions carefully (unless otherwise directed):  Hold all erectile dysfunction medications at least 3 days (72 hrs) prior to test.  On the Night Before the  Test: Be sure to Drink plenty of water. Do not consume any caffeinated/decaffeinated beverages or chocolate 12 hours prior to your test. Do not take any antihistamines 12 hours prior to your test.  On the Day of the Test: Drink plenty of water until 1 hour prior to the test. Do not eat any food 4 hours prior to the test. You may take your regular medications  prior to the test.  HOLD Furosemide/Hydrochlorothiazide morning of the test.      After the Test: Drink plenty of water. After receiving IV contrast, you may experience a mild flushed feeling. This is normal. On occasion, you may experience a mild rash up to 24 hours after the test. This is not dangerous. If this occurs, you can take Benadryl 25 mg and increase your fluid intake. If you experience trouble breathing, this can be serious. If it is severe call 911 IMMEDIATELY. If it is mild, please call our office. If you take any of these medications: Glipizide/Metformin, Avandament, Glucavance, please do not take 48 hours after completing test unless otherwise instructed.   Once we have confirmed authorization from your insurance company, we will call you to set up a date and time for your test. Based on how quickly your insurance processes prior authorizations requests, please allow up to 4 weeks to be contacted for scheduling your Cardiac CT appointment. Be advised that routine Cardiac CT appointments could be scheduled as many as 8 weeks after your provider has ordered it.  For non-scheduling related questions, please contact the cardiac imaging nurse navigator should you have any questions/concerns: Marchia Bond, Cardiac Imaging Nurse Navigator Gordy Clement, Cardiac Imaging Nurse Navigator [facility] Direct Office Dial: [phone]   For scheduling needs, including cancellations and rescheduling, please call [place], [phone].     Electrophysiology/Ablation Procedure Instructions   You are scheduled for a(n)  ablation on ____________ with Dr. Allegra Lai.   1.   Pre procedure testing-             A.  LAB WORK --- On ____________ for your pre procedure blood work.     On the day of your procedure _____________ you will go to [facility] hospital [phone] [employer]) at ________________.  You will go to the main entrance A [employer]) and enter  where the [employer] parking staff are.  Your driver will drop you off and you will head down the hallway to ADMITTING.  You may have one support person come in to the hospital with you.  They will be asked to wait in the waiting room. It is OK to have someone drop you off and come back when you are ready to be discharged.   3.   Do not eat or drink after midnight prior to your procedure.   4.   On the morning of your procedure do NOT take any medication. Do not miss any doses of your blood thinner prior to the morning of your procedure or your procedure will need to be rescheduled.   5.  Plan for an overnight stay but you may be discharged after your procedure, if you use your phone frequently bring your phone charger. If you are discharged after your procedure you will need someone to drive you home and be with you for 24 hours after your procedure.   6. You will follow up with the AFIB clinic 4 weeks after your procedure.  You will follow up with Dr. Curt Bears  3 months after your procedure.  These appointments will be made for you.   7. FYI: For your safety, and to allow [place] to monitor your vital signs accurately during the surgery/procedure we request that if you have artificial nails, gel coating, SNS etc. Please have those removed prior to your surgery/procedure. Not having the nail coverings /polish removed may result in cancellation or delay of your surgery/procedure.  * If you have ANY questions please call the office [phone] and ask for Loyce Flaming RN or send me a MyChart message   * Occasionally, EP Studies and ablations can become lengthy.  Please make your family aware of this before your procedure starts.  Average time ranges from 2-8 hours for EP studies/ablations.  Your physician will call your family after the procedure with the results.                                    Cardiac Ablation Cardiac ablation is a procedure to destroy (ablate) some heart tissue that is sending bad  signals. These bad signals causeproblems in heart rhythm. The heart has many areas that make these signals. If there are problems in these areas, they can make the heart beat in a way that is not normal.Destroying some tissues can help make the heart rhythm normal. Tell your doctor about: Any allergies you have. All medicines you are taking. These include vitamins, herbs, eye drops, creams, and over-the-counter medicines. Any problems you or family members have had with medicines that make you fall asleep (anesthetics). Any blood disorders you have. Any surgeries you have had. Any medical conditions you have, such as kidney failure. Whether you are pregnant or may be pregnant. What are the risks? This is a safe procedure. But problems may occur, including: Infection. Bruising and bleeding. Bleeding into the chest. Stroke or blood clots. Damage to nearby areas of your body. Allergies to medicines or dyes. The need for a pacemaker if the normal system is damaged. Failure of the procedure to treat the problem. What happens before the procedure? Medicines Ask your doctor about: Changing or stopping your normal medicines. This is important. Taking aspirin and ibuprofen. Do not take these medicines unless your doctor tells you to take them. Taking other medicines, vitamins, herbs, and supplements. General instructions Follow instructions from your doctor about what you cannot eat or drink. Plan to have someone take you home from the hospital or clinic. If you will be going home right after the procedure, plan to have someone with you for 24 hours. Ask your doctor what steps will be taken to prevent infection. What happens during the procedure?  An IV tube will be put into one of your veins. You will be given a medicine to help you relax. The skin on your neck or groin will be numbed. A cut (incision) will be made in your neck or groin. A needle will be put through your cut and into a  large vein. A tube (catheter) will be put into the needle. The tube will be moved to your heart. Dye may be put through the tube. This helps your doctor see your heart. Small devices (electrodes) on the tube will send out signals. A type of energy will be used to destroy some heart tissue. The tube will be taken out. Pressure will be held on your cut. This helps stop bleeding. A bandage will be put over your cut.  The exact procedure may vary among doctors and hospitals. What happens after the procedure? You will be watched until you leave the hospital or clinic. This includes checking your heart rate, breathing rate, oxygen, and blood pressure. Your cut will be watched for bleeding. You will need to lie still for a few hours. Do not drive for 24 hours or as long as your doctor tells you. Summary Cardiac ablation is a procedure to destroy some heart tissue. This is done to treat heart rhythm problems. Tell your doctor about any medical conditions you may have. Tell him or her about all medicines you are taking to treat them. This is a safe procedure. But problems may occur. These include infection, bruising, bleeding, and damage to nearby areas of your body. Follow what your doctor tells you about food and drink. You may also be told to change or stop some of your medicines. After the procedure, do not drive for 24 hours or as long as your doctor tells you. This information is not intended to replace advice given to you by your health care provider. Make sure you discuss any questions you have with your healthcare provider. Document Revised: 03/21/2019 Document Reviewed: 03/21/2019 Elsevier Patient Education  2022 [employer].

## 2020-12-01 NOTE — Progress Notes (Signed)
Electrophysiology Office Note   Date:  12/01/2020   ID:  SHERRY LUPI, DOB Aug 13, 1950, MRN [id number]  PCP:  [facility]  Cardiologist:  Angelena Form Primary Electrophysiologist:  Latiqua Daloia Meredith Leeds, MD    Chief Complaint: AF   History of Present Illness: MURIL ANDRZEJEWSKI is a 70 y.o. male who is being seen today for the evaluation of AF at the request of [facility]. Presenting today for electrophysiology evaluation.  He has a history significant for nonobstructive coronary artery disease, nonischemic cardiomyopathy, atrial fibrillation, AV block.  He also has a history of atrial fibrillation.  He was discharged from the hospital 07/31/2019 after being treated for acute on chronic systolic heart failure.  His ejection fraction was found to be 25 to 30% with global hypokinesis and severe akinesis of the entire inferior into her septal wall.  He has a history of winky block.  He is currently on Eliquis and amiodarone.  Today, he denies symptoms of palpitations, chest pain, shortness of breath, orthopnea, PND, lower extremity edema, claudication, dizziness, presyncope, syncope, bleeding, or neurologic sequela. The patient is tolerating medications without difficulties.    Past Medical History:  Diagnosis Date   Arthritis of hip    bilateral   Asthma    CAD (coronary artery disease)    LHC 5/16:  Dx ostial 30%, LAD luminal irregs, EF 40%   Chronic atrial fibrillation (HCC) [id number]   Chronic systolic CHF (congestive heart failure) ([place])    a. Echo 3/16:  inf and inf-lat HK, mild LVH, EF 45%, mild to mod LAE, normal RVF   Cough    Hiatal hernia    NICM (nonischemic cardiomyopathy) ([place])    Osteoarthritis    Personal history of colonic adenomas 02/20/2013   Pneumonia    Past Surgical History:  Procedure Laterality Date   COLONOSCOPY  02/13/13   LEFT AND RIGHT HEART CATHETERIZATION WITH CORONARY ANGIOGRAM N/A 08/18/2014   Procedure: LEFT AND RIGHT HEART  CATHETERIZATION WITH CORONARY ANGIOGRAM;  Surgeon: [name], MD;  Location: [facility] CATH LAB;  Service: Cardiovascular;  Laterality: N/A;   LEFT HEART CATH AND CORONARY ANGIOGRAPHY N/A 11/09/2017   Procedure: LEFT HEART CATH AND CORONARY ANGIOGRAPHY;  Surgeon: [name], MD;  Location: [place] CV LAB;  Service: Cardiovascular;  Laterality: N/A;   TOTAL HIP ARTHROPLASTY  2002   bilateral     Current Outpatient Medications  Medication Sig Dispense Refill   albuterol (PROVENTIL) (2.5 MG/3ML) 0.083% nebulizer solution Take 3 mLs (2.5 mg total) by nebulization every 6 (six) hours as needed for wheezing or shortness of breath. 300 mL 5   albuterol (VENTOLIN HFA) 108 (90 Base) MCG/ACT inhaler Inhale 2 puffs into the lungs every 4 (four) hours as needed. 18 g 6   amiodarone (PACERONE) 200 MG tablet Take 200 mg by mouth daily.     atorvastatin (LIPITOR) 80 MG tablet Take 80 mg by mouth daily.     cetirizine (ZYRTEC) 10 MG tablet Take 10 mg by mouth daily.     cyclobenzaprine (FLEXERIL) 10 MG tablet Take 0.5-1 tablets (5-10 mg total) by mouth 2 (two) times daily as needed for muscle spasms. 20 tablet 0   empagliflozin (JARDIANCE) 25 MG TABS tablet Take 0.5 tablets by mouth daily.     EPINEPHrine 0.3 mg/0.3 mL IJ SOAJ injection Inject 0.3 mLs (0.3 mg total) into the muscle once. 1 Device 11   fluticasone (FLONASE) 50 MCG/ACT nasal spray Place 2 sprays into  both nostrils daily. 16 g 5   furosemide (LASIX) 40 MG tablet Take 40 mg by mouth 2 (two) times daily.     gabapentin (NEURONTIN) 300 MG capsule Take 1 capsule by mouth at bedtime.     isosorbide mononitrate (IMDUR) 30 MG 24 hr tablet Take 1 tablet (30 mg total) by mouth daily. 30 tablet 2   metFORMIN (GLUCOPHAGE) 500 MG tablet Take 500 mg by mouth 2 (two) times daily with a meal.     montelukast (SINGULAIR) 10 MG tablet TAKE 1 TABLET BY MOUTH AT BEDTIME 30 tablet 30   Nebulizers MISC by Does not apply route 4 (four) times daily. Use  with nebulizer four times a day as needed for wheezing or sob     nitroGLYCERIN (NITROSTAT) 0.4 MG SL tablet Place 1 tablet (0.4 mg total) under the tongue every 5 (five) minutes as needed. 25 tablet 3   rivaroxaban (XARELTO) 20 MG TABS tablet Take 1 tablet (20 mg total) by mouth daily with supper. 30 tablet 6   sacubitril-valsartan (ENTRESTO) 97-103 MG Take 1 tablet by mouth 2 (two) times daily. 60 tablet 11   SPIRIVA HANDIHALER 18 MCG inhalation capsule INHALE THE CONTENTS OF 1 CAPSULE VIA HANDIHALER BY MOUTH DAILY 30 capsule 3   spironolactone (ALDACTONE) 25 MG tablet Take 0.5 tablets (12.5 mg total) by mouth daily. 15 tablet 2   terbinafine (LAMISIL) 1 % cream Apply 1 application topically as needed.     No current facility-administered medications for this visit.    Allergies:   Patient has no known allergies.   Social History:  The patient  reports that he quit smoking about 23 years ago. His smoking use included cigarettes. He has a 7.00 pack-year smoking history. He has never used smokeless tobacco. He reports that he does not drink alcohol and does not use drugs.   Family History:  The patient's family history includes Cancer in his maternal aunt and another family member; Dementia in his mother; Diabetes in his cousin; Stroke in his cousin.    ROS:  Please see the history of present illness.   Otherwise, review of systems is positive for none.   All other systems are reviewed and negative.    PHYSICAL EXAM: VS:  BP 124/74   Pulse (!) 59   Ht 5' 10.5" (1.791 m)   Wt 232 lb (105.2 kg)   BMI 32.82 kg/m  , BMI Body mass index is 32.82 kg/m. GEN: Well nourished, well developed, in no acute distress  HEENT: normal  Neck: no JVD, carotid bruits, or masses Cardiac: RRR; no murmurs, rubs, or gallops,no edema  Respiratory:  clear to auscultation bilaterally, normal work of breathing GI: soft, nontender, nondistended, + BS MS: no deformity or atrophy  Skin: warm and dry Neuro:   Strength and sensation are intact Psych: euthymic mood, full affect  EKG:  EKG is ordered today. Personal review of the ekg ordered shows sinus rhythm, rate 59   Recent Labs: 09/25/2020: ALT 17; BUN 17; Creatinine, Ser 1.54; Hemoglobin 15.5; Platelets 225; Potassium 4.3; Sodium 142    Lipid Panel     Component Value Date/Time   CHOL 112 09/25/2020 0921   TRIG 63 09/25/2020 0921   HDL 39 (L) 09/25/2020 0921   CHOLHDL 2.9 09/25/2020 0921   CHOLHDL 3.8 11/09/2017 0321   VLDL 19 11/09/2017 0321   LDLCALC 59 09/25/2020 0921     Wt Readings from Last 3 Encounters:  12/01/20 232 lb (105.2 kg)  09/25/20 239 lb (108.4 kg)  08/09/20 220 lb (99.8 kg)      Other studies Reviewed: Additional studies/ records that were reviewed today include: TTE 10/23/20  Review of the above records today demonstrates:   1. Left ventricular ejection fraction, by estimation, is 50 to 55%. Left  ventricular ejection fraction by 3D volume is 53 %. The left ventricle has  low normal function. The left ventricle demonstrates regional wall motion  abnormalities (see scoring  diagram/findings for description). Left ventricular diastolic parameters  are indeterminate. There is hypokinesis of the left ventricular, basal-mid  inferoseptal wall. The average left ventricular global longitudinal strain  is -17.4 %. The global  longitudinal strain is normal.   2. Right ventricular systolic function is normal. The right ventricular  size is normal.   3. The mitral valve is normal in structure. Trivial mitral valve  regurgitation. No evidence of mitral stenosis.   4. The aortic valve is tricuspid. There is moderate calcification of the  aortic valve. Aortic valve regurgitation is trivial. Mild to moderate  aortic valve sclerosis/calcification is present, without any evidence of  aortic stenosis.   5. The inferior vena cava is normal in size with greater than 50%  respiratory variability, suggesting right atrial  pressure of 3 mmHg.    ASSESSMENT AND PLAN:  1.  Persistent atrial fibrillation: Currently on amiodarone and Xarelto.  CHA2DS2-VASc at least 2.  High risk medication monitoring.  He is currently feeling well and is in normal rhythm today.  He would prefer to get off of the amiodarone.  Due to that, we Susano Cleckler plan for ablation.  Risks and benefits were discussed.  He understands these risks and has agreed to the procedure.  Risk, benefits, and alternatives to EP study and radiofrequency ablation for afib were also discussed in detail today. These risks include but are not limited to stroke, bleeding, vascular damage, tamponade, perforation, damage to the esophagus, lungs, and other structures, pulmonary vein stenosis, worsening renal function, and death. The patient understands these risk and wishes to proceed.  We Keelyn Monjaras therefore proceed with catheter ablation at the next available time.  Carto, ICE, anesthesia are requested for the procedure.  Read Bonelli also obtain CT PV protocol prior to the procedure to exclude LAA thrombus and further evaluate atrial anatomy.   2.  Nonischemic cardiomyopathy: Fortunately ejection fraction has improved.  Continue Entresto, Aldactone, Imdur, Lasix.  3.  Hyperlipidemia: Continue statin per primary cardiology.   Current medicines are reviewed at length with the patient today.   The patient does not have concerns regarding his medicines.  The following changes were made today:  none  Labs/ tests ordered today include:  Orders Placed This Encounter  Procedures   EKG 12-Lead      Disposition:   FU with Keelyn Monjaras 3 months  Signed, Emyah Roznowski Meredith Leeds, MD  12/01/2020 12:09 PM     [address] [place] [address] [phone] (office) [phone] (fax)

## 2020-12-25 ENCOUNTER — Telehealth: Payer: Self-pay | Admitting: *Deleted

## 2020-12-25 NOTE — Telephone Encounter (Signed)
Attempted to call the pts daughter Anderson Malta back x2 and received a Spanish recording without ability to leave a message.

## 2020-12-25 NOTE — Telephone Encounter (Signed)
Pt wife called in and stated we need to call pt on his number [phone]

## 2020-12-25 NOTE — Telephone Encounter (Signed)
Spoke with the pt [phone] and he is ready to schedule his Afib Ablation.. he is hoping for 01/20/21... I advised him that I will forward to Dr. Curt Bears nurse for review and she will call him with his date, time, and instructions sometime next week and he agreed.

## 2020-12-25 NOTE — Telephone Encounter (Signed)
Pts wife is calling back in to sch procedure... please advise.

## 2021-01-05 NOTE — Telephone Encounter (Signed)
Patient calling back to schedule procedure.

## 2021-01-05 NOTE — Telephone Encounter (Signed)
Pt made aware that there are no more slots avaiable for September. Date for ablation held 10/18.  Pt aware I will be in touch.

## 2021-02-08 ENCOUNTER — Telehealth: Payer: Self-pay | Admitting: *Deleted

## 2021-02-08 DIAGNOSIS — Z01812 Encounter for preprocedural laboratory examination: Secondary | ICD-10-CM

## 2021-02-08 DIAGNOSIS — I480 Paroxysmal atrial fibrillation: Secondary | ICD-10-CM

## 2021-02-08 NOTE — Telephone Encounter (Signed)
pt on the line wanting an update on when they would be scheduled for the ablation.Marland Kitchen is there any additional info available?

## 2021-02-09 NOTE — Telephone Encounter (Signed)
Left message to call back  

## 2021-02-10 ENCOUNTER — Other Ambulatory Visit: Payer: Self-pay

## 2021-02-10 ENCOUNTER — Other Ambulatory Visit: Payer: Medicare Other | Admitting: *Deleted

## 2021-02-10 DIAGNOSIS — Z01812 Encounter for preprocedural laboratory examination: Secondary | ICD-10-CM

## 2021-02-10 DIAGNOSIS — I480 Paroxysmal atrial fibrillation: Secondary | ICD-10-CM

## 2021-02-10 NOTE — Telephone Encounter (Signed)
CT & Ablation instructions reviewed with pt and wife, sent via mychart. Pt will stop by office this afternoon for blood work. CT scheduled for Friday. Aware office will call to arrange post procedure follow up. Patient verbalized understanding and agreeable to plan.

## 2021-02-11 LAB — BASIC METABOLIC PANEL
BUN/Creatinine Ratio: 10 (ref 10–24)
BUN: 13 mg/dL (ref 8–27)
CO2: 29 mmol/L (ref 20–29)
Calcium: 9.5 mg/dL (ref 8.6–10.2)
Chloride: 102 mmol/L (ref 96–106)
Creatinine, Ser: 1.35 mg/dL — ABNORMAL HIGH (ref 0.76–1.27)
Glucose: 89 mg/dL (ref 70–99)
Potassium: 4 mmol/L (ref 3.5–5.2)
Sodium: 143 mmol/L (ref 134–144)
eGFR: 56 mL/min/{1.73_m2} — ABNORMAL LOW (ref >59)

## 2021-02-11 LAB — CBC
Hematocrit: 44.5 % (ref 37.5–51.0)
Hemoglobin: 14.7 g/dL (ref 13.0–17.7)
MCH: 29.5 pg (ref 26.6–33.0)
MCHC: 33 g/dL (ref 31.5–35.7)
MCV: 89 fL (ref 79–97)
Platelets: 209 10*3/uL (ref 150–450)
RBC: 4.99 x10E6/uL (ref 4.14–5.80)
RDW: 13.2 % (ref 11.6–15.4)
WBC: 6.8 10*3/uL (ref 3.4–10.8)

## 2021-02-12 ENCOUNTER — Ambulatory Visit (HOSPITAL_COMMUNITY)
Admission: RE | Admit: 2021-02-12 | Discharge: 2021-02-12 | Disposition: A | Discharge status: Home / Self Care | Payer: No Typology Code available for payment source | Source: Ambulatory Visit | Attending: Cardiology | Admitting: Cardiology

## 2021-02-12 ENCOUNTER — Encounter: Payer: Self-pay | Admitting: Neurology

## 2021-02-12 ENCOUNTER — Other Ambulatory Visit: Payer: Self-pay

## 2021-02-12 DIAGNOSIS — I480 Paroxysmal atrial fibrillation: Secondary | ICD-10-CM

## 2021-02-12 MED ORDER — IOHEXOL 350 MG/ML SOLN
80.0000 mL | Freq: Once | INTRAVENOUS | Status: AC | PRN
  Administered 2021-02-12: 80 mL via INTRAVENOUS

## 2021-02-15 NOTE — Pre-Procedure Instructions (Signed)
Instructed patient on the following items: Arrival time 0530 Nothing to eat or drink after midnight No meds AM of procedure Responsible person to drive you home and stay with you for 24 hrs  Have you missed any doses of anti-coagulant Xarelto- hasn't missed any doses    

## 2021-02-16 ENCOUNTER — Other Ambulatory Visit: Payer: Self-pay

## 2021-02-16 ENCOUNTER — Ambulatory Visit (HOSPITAL_COMMUNITY): Payer: No Typology Code available for payment source | Admitting: Certified Registered Nurse Anesthetist

## 2021-02-16 ENCOUNTER — Encounter (HOSPITAL_COMMUNITY): Payer: Self-pay | Admitting: Cardiology

## 2021-02-16 ENCOUNTER — Ambulatory Visit (HOSPITAL_COMMUNITY)
Admission: RE | Admit: 2021-02-16 | Discharge: 2021-02-16 | Disposition: A | Discharge status: Home / Self Care | Payer: No Typology Code available for payment source | Attending: Cardiology | Admitting: Cardiology

## 2021-02-16 ENCOUNTER — Encounter (HOSPITAL_COMMUNITY)
Admission: RE | Disposition: A | Discharge status: Home / Self Care | Payer: Self-pay | Source: Home / Self Care | Attending: Cardiology

## 2021-02-16 DIAGNOSIS — I4819 Other persistent atrial fibrillation: Secondary | ICD-10-CM | POA: Insufficient documentation

## 2021-02-16 DIAGNOSIS — I5082 Biventricular heart failure: Secondary | ICD-10-CM | POA: Diagnosis not present

## 2021-02-16 DIAGNOSIS — I5022 Chronic systolic (congestive) heart failure: Secondary | ICD-10-CM | POA: Insufficient documentation

## 2021-02-16 DIAGNOSIS — I428 Other cardiomyopathies: Secondary | ICD-10-CM | POA: Insufficient documentation

## 2021-02-16 DIAGNOSIS — I2510 Atherosclerotic heart disease of native coronary artery without angina pectoris: Secondary | ICD-10-CM | POA: Insufficient documentation

## 2021-02-16 DIAGNOSIS — Z7901 Long term (current) use of anticoagulants: Secondary | ICD-10-CM | POA: Diagnosis not present

## 2021-02-16 DIAGNOSIS — Z87891 Personal history of nicotine dependence: Secondary | ICD-10-CM | POA: Insufficient documentation

## 2021-02-16 HISTORY — PX: ATRIAL FIBRILLATION ABLATION: EP1191

## 2021-02-16 LAB — GLUCOSE, CAPILLARY
Glucose-Capillary: 121 mg/dL — ABNORMAL HIGH (ref 70–99)
Glucose-Capillary: 147 mg/dL — ABNORMAL HIGH (ref 70–99)

## 2021-02-16 LAB — POCT ACTIVATED CLOTTING TIME
Activated Clotting Time: 341 seconds
Activated Clotting Time: 376 seconds

## 2021-02-16 SURGERY — ATRIAL FIBRILLATION ABLATION
Anesthesia: General

## 2021-02-16 MED ORDER — SODIUM CHLORIDE 0.9% FLUSH: 3.0000 mL | Freq: Two times a day (BID) | INTRAVENOUS | Status: DC

## 2021-02-16 MED ORDER — PHENYLEPHRINE HCL (PRESSORS) 10 MG/ML IV SOLN
INTRAVENOUS | Status: DC | PRN
  Administered 2021-02-16: 88 ug via INTRAVENOUS
  Administered 2021-02-16: 80 ug via INTRAVENOUS

## 2021-02-16 MED ORDER — HEPARIN SODIUM (PORCINE) 1000 UNIT/ML IJ SOLN
INTRAMUSCULAR | Status: AC
  Filled 2021-02-16: qty 1

## 2021-02-16 MED ORDER — SODIUM CHLORIDE 0.9 % IV SOLN: 250.0000 mL | INTRAVENOUS | Status: DC | PRN

## 2021-02-16 MED ORDER — DOBUTAMINE INFUSION FOR EP/ECHO/NUC (1000 MCG/ML)
INTRAVENOUS | Status: AC
  Filled 2021-02-16: qty 250

## 2021-02-16 MED ORDER — LIDOCAINE 2% (20 MG/ML) 5 ML SYRINGE
INTRAMUSCULAR | Status: DC | PRN
  Administered 2021-02-16: 40 mg via INTRAVENOUS

## 2021-02-16 MED ORDER — ONDANSETRON HCL 4 MG/2ML IJ SOLN: 4.0000 mg | Freq: Four times a day (QID) | INTRAMUSCULAR | Status: DC | PRN

## 2021-02-16 MED ORDER — SODIUM CHLORIDE 0.9% FLUSH: 3.0000 mL | INTRAVENOUS | Status: DC | PRN

## 2021-02-16 MED ORDER — SUGAMMADEX SODIUM 200 MG/2ML IV SOLN
INTRAVENOUS | Status: DC | PRN
  Administered 2021-02-16: 200 mg via INTRAVENOUS

## 2021-02-16 MED ORDER — ONDANSETRON HCL 4 MG/2ML IJ SOLN
INTRAMUSCULAR | Status: DC | PRN
  Administered 2021-02-16: 4 mg via INTRAVENOUS

## 2021-02-16 MED ORDER — PROPOFOL 10 MG/ML IV BOLUS
INTRAVENOUS | Status: DC | PRN
  Administered 2021-02-16: 40 mg via INTRAVENOUS
  Administered 2021-02-16: 160 mg via INTRAVENOUS

## 2021-02-16 MED ORDER — SODIUM CHLORIDE 0.9 % IV SOLN: INTRAVENOUS | Status: DC | PRN

## 2021-02-16 MED ORDER — ROCURONIUM BROMIDE 10 MG/ML (PF) SYRINGE
PREFILLED_SYRINGE | INTRAVENOUS | Status: DC | PRN
  Administered 2021-02-16: 100 mg via INTRAVENOUS

## 2021-02-16 MED ORDER — PROTAMINE SULFATE 10 MG/ML IV SOLN
INTRAVENOUS | Status: DC | PRN
  Administered 2021-02-16: 30 mg via INTRAVENOUS
  Administered 2021-02-16: 10 mg via INTRAVENOUS

## 2021-02-16 MED ORDER — DOBUTAMINE INFUSION FOR EP/ECHO/NUC (1000 MCG/ML)
INTRAVENOUS | Status: DC | PRN
Start: 2021-02-16 — End: 2021-02-16
  Administered 2021-02-16: 20 ug/kg/min via INTRAVENOUS

## 2021-02-16 MED ORDER — FENTANYL CITRATE (PF) 250 MCG/5ML IJ SOLN
INTRAMUSCULAR | Status: DC | PRN
  Administered 2021-02-16: 100 ug via INTRAVENOUS

## 2021-02-16 MED ORDER — DEXAMETHASONE SODIUM PHOSPHATE 10 MG/ML IJ SOLN
INTRAMUSCULAR | Status: DC | PRN
  Administered 2021-02-16: 5 mg via INTRAVENOUS

## 2021-02-16 MED ORDER — PHENYLEPHRINE HCL-NACL 20-0.9 MG/250ML-% IV SOLN
INTRAVENOUS | Status: DC | PRN
  Administered 2021-02-16: 25 ug/min via INTRAVENOUS

## 2021-02-16 MED ORDER — DOBUTAMINE INFUSION FOR EP/ECHO/NUC (1000 MCG/ML): INTRAVENOUS | Status: DC | PRN

## 2021-02-16 MED ORDER — HEPARIN (PORCINE) IN NACL 1000-0.9 UT/500ML-% IV SOLN
INTRAVENOUS | Status: DC | PRN
  Administered 2021-02-16 (×4): 500 mL

## 2021-02-16 MED ORDER — HEPARIN SODIUM (PORCINE) 1000 UNIT/ML IJ SOLN
INTRAMUSCULAR | Status: DC | PRN
  Administered 2021-02-16: 15000 [IU] via INTRAVENOUS
  Administered 2021-02-16: 1000 [IU] via INTRAVENOUS

## 2021-02-16 MED ORDER — SODIUM CHLORIDE 0.9 % IV SOLN: INTRAVENOUS | Status: DC

## 2021-02-16 MED ORDER — ACETAMINOPHEN 325 MG PO TABS: 650.0000 mg | ORAL_TABLET | ORAL | Status: DC | PRN

## 2021-02-16 MED ORDER — HEPARIN SODIUM (PORCINE) 1000 UNIT/ML IJ SOLN
INTRAMUSCULAR | Status: DC | PRN
  Administered 2021-02-16: 1000 [IU] via INTRAVENOUS

## 2021-02-16 SURGICAL SUPPLY — 20 items
BAG SNAP BAND KOVER 36X36 (MISCELLANEOUS) ×1 IMPLANT
CATH OCTARAY 2.0 F 3-3-3-3-3 (CATHETERS) ×1 IMPLANT
CATH S CIRCA THERM PROBE 10F (CATHETERS) ×1 IMPLANT
CATH SMTCH THERMOCOOL SF DF (CATHETERS) ×1 IMPLANT
CATH SOUNDSTAR ECO 8FR (CATHETERS) ×1 IMPLANT
CATH WEB BI DIR CSDF CRV REPRO (CATHETERS) ×1 IMPLANT
CLOSURE PERCLOSE PROSTYLE (VASCULAR PRODUCTS) ×4 IMPLANT
COVER SWIFTLINK CONNECTOR (BAG) ×2 IMPLANT
KIT CATH VERSACROSS STEERABLE (CATHETERS) ×1 IMPLANT
MAT PREVALON FULL STRYKER (MISCELLANEOUS) ×1 IMPLANT
PACK EP LATEX FREE (CUSTOM PROCEDURE TRAY) ×2
PACK EP LF (CUSTOM PROCEDURE TRAY) ×1 IMPLANT
PAD PRO RADIOLUCENT 2001M-C (PAD) ×2 IMPLANT
PATCH CARTO3 (PAD) ×1 IMPLANT
SHEATH CARTO VIZIGO SM CVD (SHEATH) ×1 IMPLANT
SHEATH PINNACLE 7F 10CM (SHEATH) ×1 IMPLANT
SHEATH PINNACLE 8F 10CM (SHEATH) ×2 IMPLANT
SHEATH PINNACLE 9F 10CM (SHEATH) ×1 IMPLANT
SHEATH PROBE COVER 6X72 (BAG) ×1 IMPLANT
TUBING SMART ABLATE COOLFLOW (TUBING) ×1 IMPLANT

## 2021-02-16 NOTE — Anesthesia Procedure Notes (Signed)
Procedure Name: Intubation Date/Time: 02/16/2021 7:54 AM Performed by: Glynda Jaeger, CRNA Pre-anesthesia Checklist: Patient identified, Patient being monitored, Timeout performed, Emergency Drugs available and Suction available Patient Re-evaluated:Patient Re-evaluated prior to induction Oxygen Delivery Method: Circle System Utilized Preoxygenation: Pre-oxygenation with 100% oxygen Induction Type: IV induction Ventilation: Mask ventilation without difficulty Laryngoscope Size: Mac and 4 Grade View: Grade I Tube type: Oral Tube size: 7.5 mm Number of attempts: 1 Airway Equipment and Method: Stylet Placement Confirmation: ETT inserted through vocal cords under direct vision, positive ETCO2 and breath sounds checked- equal and bilateral Secured at: 21 cm Tube secured with: Tape Dental Injury: Teeth and Oropharynx as per pre-operative assessment

## 2021-02-16 NOTE — Anesthesia Preprocedure Evaluation (Signed)
Anesthesia Evaluation  Patient identified by MRN, date of birth, ID band Patient awake    Reviewed: Allergy & Precautions, NPO status , Patient's Chart, lab work & pertinent test results  History of Anesthesia Complications Negative for: history of anesthetic complications  Airway Mallampati: III  TM Distance: >3 FB Neck ROM: Full    Dental  (+) Dental Advisory Given, Poor Dentition, Missing, Loose,    Pulmonary shortness of breath, asthma , sleep apnea , former smoker,    breath sounds clear to auscultation       Cardiovascular + CAD and +CHF  + dysrhythmias Atrial Fibrillation  Rhythm:Regular  1. Left ventricular ejection fraction, by estimation, is 50 to 55%. Left  ventricular ejection fraction by 3D volume is 53 %. The left ventricle has  low normal function. The left ventricle demonstrates regional wall motion  abnormalities (see scoring  diagram/findings for description). Left ventricular diastolic parameters  are indeterminate. There is hypokinesis of the left ventricular, basal-mid  inferoseptal wall. The average left ventricular global longitudinal strain  is -17.4 %. The global  longitudinal strain is normal.  2. Right ventricular systolic function is normal. The right ventricular  size is normal.  3. The mitral valve is normal in structure. Trivial mitral valve  regurgitation. No evidence of mitral stenosis.  4. The aortic valve is tricuspid. There is moderate calcification of the  aortic valve. Aortic valve regurgitation is trivial. Mild to moderate  aortic valve sclerosis/calcification is present, without any evidence of  aortic stenosis.  5. The inferior vena cava is normal in size with greater than 50%  respiratory variability, suggesting right atrial pressure of 3 mmHg   IMPRESSION: Marvin Chandler has noncritical CAD with minimal disease in his LAD.  He is filling pressures only mildly elevated.  These are  similar to the findings demonstrated by right and left heart cath performed by Dr. Angelena Form 08/18/2014.  I believe his pain is noncardiac.  Medical therapy will be recommended.  The sheath was removed and a TR band was placed on the right wrist to achieve patent hemostasis.  The patient left the lab in stable condition.  I did relay the results of his calf to his daughter Lynelle Smoke on [address]   Neuro/Psych neg Seizures negative psych ROS   GI/Hepatic Neg liver ROS, hiatal hernia,   Endo/Other  diabetes, Type 2  Renal/GU negative Renal ROS     Musculoskeletal  (+) Arthritis ,   Abdominal   Peds  Hematology  (+) Blood dyscrasia, , Lab Results      Component                Value               Date                      WBC                      6.8                 02/10/2021                HGB                      14.7                02/10/2021  HCT                      44.5                02/10/2021                MCV                      89                  02/10/2021                PLT                      209                 02/10/2021            xarelto   Anesthesia Other Findings   Reproductive/Obstetrics                             Anesthesia Physical Anesthesia Plan  ASA: 2  Anesthesia Plan: General   Post-op Pain Management:    Induction: Intravenous  PONV Risk Score and Plan: 2 and Ondansetron and Dexamethasone  Airway Management Planned: Oral ETT  Additional Equipment: None  Intra-op Plan:   Post-operative Plan: Extubation in OR  Informed Consent: I have reviewed the patients History and Physical, chart, labs and discussed the procedure including the risks, benefits and alternatives for the proposed anesthesia with the patient or authorized representative who has indicated his/her understanding and acceptance.       Plan Discussed with: CRNA and Anesthesiologist  Anesthesia Plan Comments:         Anesthesia Quick  Evaluation

## 2021-02-16 NOTE — Transfer of Care (Signed)
Immediate Anesthesia Transfer of Care Note  Patient: Marvin Chandler  Procedure(s) Performed: ATRIAL FIBRILLATION ABLATION  Patient Location: Cath Lab  Anesthesia Type:General  Level of Consciousness: awake, alert , patient cooperative and responds to stimulation  Airway & Oxygen Therapy: Patient Spontanous Breathing and Patient connected to face mask oxygen  Post-op Assessment: Report given to RN and Post -op Vital signs reviewed and stable  Post vital signs: Reviewed and stable  Last Vitals:  Vitals Value Taken Time  BP 105/70 02/16/21 1005  Temp    Pulse 72 02/16/21 1006  Resp 21 02/16/21 1006  SpO2 97 % 02/16/21 1006  Vitals shown include unvalidated device data.  Last Pain:  Vitals:   02/16/21 0558  TempSrc:   PainSc: 0-No pain         Complications: There were no known notable events for this encounter.

## 2021-02-16 NOTE — H&P (Signed)
Electrophysiology Office Note   Date:  02/16/2021   ID:  Marvin Chandler, DOB 08-10-1950, MRN [id number]  PCP:  [facility]  Cardiologist:  Angelena Form Primary Electrophysiologist:  Philemon Riedesel Meredith Leeds, MD    Chief Complaint: AF   History of Present Illness: Marvin Chandler is a 70 y.o. male who is being seen today for the evaluation of AF at the request of No ref. provider found. Presenting today for electrophysiology evaluation.  He has a history significant for nonobstructive coronary artery disease, nonischemic cardiomyopathy, atrial fibrillation, AV block.  He also has a history of atrial fibrillation.  He was discharged from the hospital 07/31/2019 after being treated for acute on chronic systolic heart failure.  His ejection fraction was found to be 25 to 30% with global hypokinesis and severe akinesis of the entire inferior into her septal wall.  He has a history of winky block.  He is currently on Eliquis and amiodarone.  Today, denies symptoms of palpitations, chest pain, shortness of breath, orthopnea, PND, lower extremity edema, claudication, dizziness, presyncope, syncope, bleeding, or neurologic sequela. The patient is tolerating medications without difficulties. Plan for AF ablation today.    Past Medical History:  Diagnosis Date   Arthritis of hip    bilateral   Asthma    CAD (coronary artery disease)    LHC 5/16:  Dx ostial 30%, LAD luminal irregs, EF 40%   Chronic atrial fibrillation (HCC) 7/68/1157   Chronic systolic CHF (congestive heart failure) ([place])    a. Echo 3/16:  inf and inf-lat HK, mild LVH, EF 45%, mild to mod LAE, normal RVF   Cough    Hiatal hernia    NICM (nonischemic cardiomyopathy) ([place])    Osteoarthritis    Personal history of colonic adenomas 02/20/2013   Pneumonia    Past Surgical History:  Procedure Laterality Date   COLONOSCOPY  02/13/13   LEFT AND RIGHT HEART CATHETERIZATION WITH CORONARY ANGIOGRAM N/A 08/18/2014    Procedure: LEFT AND RIGHT HEART CATHETERIZATION WITH CORONARY ANGIOGRAM;  Surgeon: [name], MD;  Location: [facility] CATH LAB;  Service: Cardiovascular;  Laterality: N/A;   LEFT HEART CATH AND CORONARY ANGIOGRAPHY N/A 11/09/2017   Procedure: LEFT HEART CATH AND CORONARY ANGIOGRAPHY;  Surgeon: [name], MD;  Location: [place] CV LAB;  Service: Cardiovascular;  Laterality: N/A;   TOTAL HIP ARTHROPLASTY  2002   bilateral     Current Facility-Administered Medications  Medication Dose Route Frequency Provider Last Rate Last Admin   0.9 %  sodium chloride infusion   Intravenous Continuous Tracey Hermance, Ocie Doyne, MD        Allergies:   Patient has no known allergies.   Social History:  The patient  reports that he quit smoking about 23 years ago. His smoking use included cigarettes. He has a 7.00 pack-year smoking history. He has never used smokeless tobacco. He reports that he does not drink alcohol and does not use drugs.   Family History:  The patient's family history includes Cancer in his maternal aunt and another family member; Dementia in his mother; Diabetes in his cousin; Stroke in his cousin.   ROS:  Please see the history of present illness.   Otherwise, review of systems is positive for none.   All other systems are reviewed and negative.   PHYSICAL EXAM: VS:  BP 122/62   Pulse (!) 57   Temp (!) 97.5 F (36.4 C) (Oral)   Ht 5\' 11"  (1.803 m)  Wt 102.1 kg   SpO2 97%   BMI 31.38 kg/m  , BMI Body mass index is 31.38 kg/m. GEN: Well nourished, well developed, in no acute distress  HEENT: normal  Neck: no JVD, carotid bruits, or masses Cardiac: RRR; no murmurs, rubs, or gallops,no edema  Respiratory:  clear to auscultation bilaterally, normal work of breathing GI: soft, nontender, nondistended, + BS MS: no deformity or atrophy  Skin: warm and dry Neuro:  Strength and sensation are intact Psych: euthymic mood, full affect  Recent Labs: 09/25/2020: ALT  17 02/10/2021: BUN 13; Creatinine, Ser 1.35; Hemoglobin 14.7; Platelets 209; Potassium 4.0; Sodium 143    Lipid Panel     Component Value Date/Time   CHOL 112 09/25/2020 0921   TRIG 63 09/25/2020 0921   HDL 39 (L) 09/25/2020 0921   CHOLHDL 2.9 09/25/2020 0921   CHOLHDL 3.8 11/09/2017 0321   VLDL 19 11/09/2017 0321   LDLCALC 59 09/25/2020 0921     Wt Readings from Last 3 Encounters:  02/16/21 102.1 kg  12/01/20 105.2 kg  09/25/20 108.4 kg      Other studies Reviewed: Additional studies/ records that were reviewed today include: TTE 10/23/20  Review of the above records today demonstrates:   1. Left ventricular ejection fraction, by estimation, is 50 to 55%. Left  ventricular ejection fraction by 3D volume is 53 %. The left ventricle has  low normal function. The left ventricle demonstrates regional wall motion  abnormalities (see scoring  diagram/findings for description). Left ventricular diastolic parameters  are indeterminate. There is hypokinesis of the left ventricular, basal-mid  inferoseptal wall. The average left ventricular global longitudinal strain  is -17.4 %. The global  longitudinal strain is normal.   2. Right ventricular systolic function is normal. The right ventricular  size is normal.   3. The mitral valve is normal in structure. Trivial mitral valve  regurgitation. No evidence of mitral stenosis.   4. The aortic valve is tricuspid. There is moderate calcification of the  aortic valve. Aortic valve regurgitation is trivial. Mild to moderate  aortic valve sclerosis/calcification is present, without any evidence of  aortic stenosis.   5. The inferior vena cava is normal in size with greater than 50%  respiratory variability, suggesting right atrial pressure of 3 mmHg.    ASSESSMENT AND PLAN:  1.  Persistent atrial fibrillation: Marvin Chandler has presented today for surgery, with the diagnosis of atrial fibrillation.  The various methods of treatment  have been discussed with the patient and family. After consideration of risks, benefits and other options for treatment, the patient has consented to  Procedure(s): Catheter ablation as a surgical intervention .  Risks include but not limited to complete heart block, stroke, esophageal damage, nerve damage, bleeding, vascular damage, tamponade, perforation, MI, and death. The patient's history has been reviewed, patient examined, no change in status, stable for surgery.  I have reviewed the patient's chart and labs.  Questions were answered to the patient's satisfaction.    Marvin Delatorre Curt Bears, MD 02/16/2021 7:10 AM

## 2021-02-18 NOTE — Anesthesia Postprocedure Evaluation (Signed)
Anesthesia Post Note  Patient: Marvin Chandler  Procedure(s) Performed: ATRIAL FIBRILLATION ABLATION     Patient location during evaluation: Cath Lab Anesthesia Type: General Level of consciousness: awake and alert Pain management: pain level controlled Vital Signs Assessment: post-procedure vital signs reviewed and stable Respiratory status: spontaneous breathing, nonlabored ventilation, respiratory function stable and patient connected to nasal cannula oxygen Cardiovascular status: blood pressure returned to baseline and stable Postop Assessment: no apparent nausea or vomiting Anesthetic complications: no   There were no known notable events for this encounter.  Last Vitals:  Vitals:   02/16/21 1200 02/16/21 1300  BP: 129/62 135/70  Pulse: 73 81  Resp: 18 16  Temp:    SpO2: 98% 100%    Last Pain:  Vitals:   02/17/21 1029  TempSrc:   PainSc: 0-No pain                 Shekira Drummer

## 2021-02-23 ENCOUNTER — Telehealth: Payer: Self-pay | Admitting: Physician Assistant

## 2021-02-23 NOTE — Telephone Encounter (Signed)
Pt called stating he has a swelling/knot in his groin near his surgical site that is not present on the other side. The knot is the size of a small fingernail. No discharge, site is closed. No fevers or chills. His right testicle has started aching. Testicle is not swollen or warm to the touch. Pain is intermittent and started today. He is urinating well, no dysuria. He thinks it may have been the tight pants he was wearing.   I advised to watch this for now. We discussed ER precautions (fever, urinary retention, worsening pain). He expressed understanding of the plan.   I will send my note to Dr. Curt Bears.

## 2021-03-16 ENCOUNTER — Ambulatory Visit (HOSPITAL_COMMUNITY): Payer: No Typology Code available for payment source | Admitting: Physician Assistant

## 2021-04-09 ENCOUNTER — Emergency Department (HOSPITAL_COMMUNITY)
Admission: EM | Admit: 2021-04-09 | Discharge: 2021-04-10 | Disposition: A | Discharge status: Home / Self Care | Payer: No Typology Code available for payment source | Attending: Emergency Medicine | Admitting: Emergency Medicine

## 2021-04-09 ENCOUNTER — Encounter (HOSPITAL_COMMUNITY): Payer: Self-pay

## 2021-04-09 DIAGNOSIS — I2510 Atherosclerotic heart disease of native coronary artery without angina pectoris: Secondary | ICD-10-CM | POA: Insufficient documentation

## 2021-04-09 DIAGNOSIS — R42 Dizziness and giddiness: Secondary | ICD-10-CM | POA: Diagnosis present

## 2021-04-09 DIAGNOSIS — J45909 Unspecified asthma, uncomplicated: Secondary | ICD-10-CM | POA: Insufficient documentation

## 2021-04-09 DIAGNOSIS — Z7984 Long term (current) use of oral hypoglycemic drugs: Secondary | ICD-10-CM | POA: Insufficient documentation

## 2021-04-09 DIAGNOSIS — Z87891 Personal history of nicotine dependence: Secondary | ICD-10-CM | POA: Diagnosis not present

## 2021-04-09 DIAGNOSIS — Z79899 Other long term (current) drug therapy: Secondary | ICD-10-CM | POA: Diagnosis not present

## 2021-04-09 DIAGNOSIS — I5023 Acute on chronic systolic (congestive) heart failure: Secondary | ICD-10-CM | POA: Insufficient documentation

## 2021-04-09 DIAGNOSIS — Z7901 Long term (current) use of anticoagulants: Secondary | ICD-10-CM | POA: Insufficient documentation

## 2021-04-09 DIAGNOSIS — I110 Hypertensive heart disease with heart failure: Secondary | ICD-10-CM | POA: Insufficient documentation

## 2021-04-09 LAB — CBC
HCT: 45.4 % (ref 39.0–52.0)
Hemoglobin: 14.5 g/dL (ref 13.0–17.0)
MCH: 29.6 pg (ref 26.0–34.0)
MCHC: 31.9 g/dL (ref 30.0–36.0)
MCV: 92.7 fL (ref 80.0–100.0)
Platelets: 224 10*3/uL (ref 150–400)
RBC: 4.9 MIL/uL (ref 4.22–5.81)
RDW: 13.7 % (ref 11.5–15.5)
WBC: 7.1 10*3/uL (ref 4.0–10.5)
nRBC: 0 % (ref 0.0–0.2)

## 2021-04-09 LAB — BASIC METABOLIC PANEL
Anion gap: 6 (ref 5–15)
BUN: 11 mg/dL (ref 8–23)
CO2: 29 mmol/L (ref 22–32)
Calcium: 8.9 mg/dL (ref 8.9–10.3)
Chloride: 104 mmol/L (ref 98–111)
Creatinine, Ser: 1.41 mg/dL — ABNORMAL HIGH (ref 0.61–1.24)
GFR, Estimated: 53 mL/min — ABNORMAL LOW (ref >60)
Glucose, Bld: 127 mg/dL — ABNORMAL HIGH (ref 70–99)
Potassium: 3.6 mmol/L (ref 3.5–5.1)
Sodium: 139 mmol/L (ref 135–145)

## 2021-04-09 NOTE — ED Provider Notes (Signed)
Emergency Medicine Provider Triage Evaluation Note  Marvin Chandler , a 70 y.o. male  was evaluated in triage.  Pt complains of weakness and substernal chest pain yesterday. Denies any shortness of breath or nausea.  Patient reported he just eaten a meal and laid down when he started to feel the chest pain, when he sat he started to feel better.  He went to stand up after this and felt lightheaded, no syncope.  Patient reported the chest pain lasted less than a minute and was substernal.  He denies any palpitations.  He denies any unilateral weakness.  Patient reports he was seen yesterday at the [facility] and was told to come back today for a "checkup".  Denies any additional weakness, chest pain, or any other symptoms today.  Unable to see notes.  Review of Systems  Positive: Lightheaded, chest pain  Negative: SOB, nausea, vomiting, syncope  Physical Exam  BP 112/85 (BP Location: Right Arm)   Pulse 81   Temp 98.1 F (36.7 C)   Resp 18   Ht 5\' 11"  (1.803 m)   Wt 102.1 kg   SpO2 99%   BMI 31.38 kg/m  Gen:   Awake, no distress   Resp:  Normal effort  MSK:   Moves extremities without difficulty , no pronator drift Other:  Pulses equal, no pronator drift.  Medical Decision Making  Medically screening exam initiated at 2:09 PM.  Appropriate orders placed.  Donevan A Bradham was informed that the remainder of the evaluation will be completed by another provider, this initial triage assessment does not replace that evaluation, and the importance of remaining in the ED until their evaluation is complete.  Weakness order set placed.   Sherrell Puller, PA-C 04/09/21 1412    Wyvonnia Dusky, MD 04/09/21 [phone]

## 2021-04-09 NOTE — ED Triage Notes (Signed)
Pt states he was referred to the ED from the [place] where he was seen yesterday for dizziness. Pt states that prior to going to the [place] he had slight upper abdominal pain that had subsided by the time he was seen. Denies CP, SOB, N/V currently. Pt states he has no symptoms currently, but was referred to ED for follow-up.

## 2021-04-10 NOTE — ED Notes (Signed)
Pt verbalized understanding of d/c instructions, meds, and followup care. Denies questions. VSS, no distress noted. Steady gait to exit with all belongings.  ?

## 2021-04-10 NOTE — Discharge Instructions (Signed)

## 2021-04-10 NOTE — ED Provider Notes (Signed)
[place] EMERGENCY DEPARTMENT Provider Note   CSN: [id number] Arrival date & time: 04/09/21  1318     History Chief Complaint  Patient presents with   Dizziness    Marvin Chandler is a 70 y.o. male.  The history is provided by the patient.  Dizziness Quality:  Lightheadedness Severity:  Mild Onset quality:  Gradual Progression:  Resolved Chronicity:  New Relieved by:  Nothing Worsened by:  Nothing Associated symptoms: no blood in stool, no diarrhea, no headaches, no nausea, no shortness of breath, no syncope, no vision changes, no vomiting and no weakness   Patient with history of CAD, atrial fibrillation, CHF presents for evaluation.  Patient reports over 24 hours ago he ate some food and had some substernal and epigastric chest pain.  This resolved fairly quickly.  He also reports he had transient episode of dizziness.  The following morning he went and was evaluated at his local [facility].  They instructed him to come to this hospital for further evaluation.  He reports he has not had any further dizziness.  No focal weakness.  No headache.  He reports while he was in the waiting room for several hours he ate a sandwich and had mild abdominal discomfort that has improved. He does not think this is medication related     Past Medical History:  Diagnosis Date   Arthritis of hip    bilateral   Asthma    CAD (coronary artery disease)    LHC 5/16:  Dx ostial 30%, LAD luminal irregs, EF 40%   Chronic atrial fibrillation (HCC) 0/96/2836   Chronic systolic CHF (congestive heart failure) ([place])    a. Echo 3/16:  inf and inf-lat HK, mild LVH, EF 45%, mild to mod LAE, normal RVF   Cough    Hiatal hernia    NICM (nonischemic cardiomyopathy) ([place])    Osteoarthritis    Personal history of colonic adenomas 02/20/2013   Pneumonia     Patient Active Problem List   Diagnosis Date Noted   OSA (obstructive sleep apnea) 07/16/2020   Non-Obstructive CAD  07/31/2019   Demand ischemia ([place]) 07/31/2019   Dyslipidemia 07/31/2019   Acute on chronic systolic CHF (congestive heart failure) ([place]) 07/29/2019   Persistent atrial fibrillation ([place]) 62/94/7654   Acute systolic CHF (congestive heart failure) ([place]) 07/29/2019   Non-cardiac chest pain 11/08/2017   Asthma with acute exacerbation 10/06/2014   History of asthma 08/15/2014   Hemoptysis 08/15/2014   Chronic cough 08/15/2014   Cardiomyopathy ([place]) 08/13/2014   Asthma exacerbation 07/16/2014   Pleuritic chest pain 07/16/2014   Asthma, chronic 02/19/2014   Personal history of colonic adenomas 02/20/2013   Dyspnea 12/16/2011   Chest pain with moderate risk for cardiac etiology 12/16/2011   HIATAL HERNIA 07/11/2006   OSTEOARTHRITIS 07/11/2006   ARTHRITIS, HIPS, BILATERAL 07/11/2006    Past Surgical History:  Procedure Laterality Date   ATRIAL FIBRILLATION ABLATION N/A 02/16/2021   Procedure: ATRIAL FIBRILLATION ABLATION;  Surgeon: Constance Haw, MD;  Location: [place] CV LAB;  Service: Cardiovascular;  Laterality: N/A;   COLONOSCOPY  02/13/13   LEFT AND RIGHT HEART CATHETERIZATION WITH CORONARY ANGIOGRAM N/A 08/18/2014   Procedure: LEFT AND RIGHT HEART CATHETERIZATION WITH CORONARY ANGIOGRAM;  Surgeon: [name], MD;  Location: [facility] CATH LAB;  Service: Cardiovascular;  Laterality: N/A;   LEFT HEART CATH AND CORONARY ANGIOGRAPHY N/A 11/09/2017   Procedure: LEFT HEART CATH AND CORONARY ANGIOGRAPHY;  Surgeon: [name],  MD;  Location: [place] CV LAB;  Service: Cardiovascular;  Laterality: N/A;   TOTAL HIP ARTHROPLASTY  2002   bilateral       Family History  Problem Relation Age of Onset   Dementia Mother    Cancer Other    Stroke Cousin    Cancer Maternal Aunt    Diabetes Cousin    Colon cancer Neg Hx    Esophageal cancer Neg Hx    Heart attack Neg Hx    Hypertension Neg Hx     Social History   Tobacco Use   Smoking status: Former    Packs/day:  1.00    Years: 7.00    Pack years: 7.00    Types: Cigarettes    Quit date: 11/05/1997    Years since quitting: 23.4   Smokeless tobacco: Never  Vaping Use   Vaping Use: Never used  Substance Use Topics   Alcohol use: No    Alcohol/week: 0.0 standard drinks   Drug use: No    Home Medications Prior to Admission medications   Medication Sig Start Date End Date Taking? Authorizing Provider  albuterol (PROVENTIL) (2.5 MG/3ML) 0.083% nebulizer solution Take 3 mLs (2.5 mg total) by nebulization every 6 (six) hours as needed for wheezing or shortness of breath. 07/22/14   Theodis Blaze, MD  albuterol (VENTOLIN HFA) 108 (90 Base) MCG/ACT inhaler Inhale 2 puffs into the lungs every 4 (four) hours as needed. 06/13/16   Brand Males, MD  amiodarone (PACERONE) 200 MG tablet Take 200 mg by mouth at bedtime.    [provider]  atorvastatin (LIPITOR) 80 MG tablet Take 80 mg by mouth at bedtime.    [provider]  empagliflozin (JARDIANCE) 25 MG TABS tablet Take 12.5 mg by mouth daily. Take one half tablet daily 05/28/20   [provider]  EPINEPHrine 0.3 mg/0.3 mL IJ SOAJ injection Inject 0.3 mLs (0.3 mg total) into the muscle once. 08/05/15   Deneise Lever, MD  fluticasone (FLONASE) 50 MCG/ACT nasal spray Place 2 sprays into both nostrils daily. 07/11/16   Brand Males, MD  fluticasone-salmeterol (ADVAIR) 250-50 MCG/ACT AEPB Inhale 1 puff into the lungs daily.    [provider]  furosemide (LASIX) 40 MG tablet Take 40 mg by mouth 2 (two) times daily with a meal.    [provider]  gabapentin (NEURONTIN) 300 MG capsule Take 300 mg by mouth at bedtime. 08/24/20   [provider]  isosorbide mononitrate (IMDUR) 30 MG 24 hr tablet Take 1 tablet (30 mg total) by mouth daily. 08/01/19   Sande Rives E, PA-C  loratadine (CLARITIN) 10 MG tablet Take 10 mg by mouth daily.    [provider]  metFORMIN (GLUCOPHAGE) 500 MG tablet Take 500  mg by mouth daily with breakfast.    [provider]  montelukast (SINGULAIR) 10 MG tablet TAKE 1 TABLET BY MOUTH AT BEDTIME 08/02/16   Brand Males, MD  nitroGLYCERIN (NITROSTAT) 0.4 MG SL tablet Place 1 tablet (0.4 mg total) under the tongue every 5 (five) minutes as needed. 11/10/17   Lyda Jester M, PA-C  omeprazole (PRILOSEC) 20 MG capsule Take 40 mg by mouth daily.    [provider]  polyvinyl alcohol (LIQUIFILM TEARS) 1.4 % ophthalmic solution Place 1 drop into both eyes 4 (four) times daily as needed for dry eyes.    [provider]  rivaroxaban (XARELTO) 20 MG TABS tablet Take 1 tablet (20 mg total) by mouth  daily with supper. 06/22/16   Burnell Blanks, MD  sacubitril-valsartan (ENTRESTO) 97-103 MG Take 1 tablet by mouth 2 (two) times daily. 08/27/19   Imogene Burn, PA-C  spironolactone (ALDACTONE) 25 MG tablet Take 0.5 tablets (12.5 mg total) by mouth daily. 08/01/19   Sande Rives E, PA-C  terbinafine (LAMISIL) 1 % cream Apply 1 application topically daily as needed (athelete's foot and nail fungus). 09/07/20   [provider]  terbinafine (LAMISIL) 250 MG tablet Take 250 mg by mouth daily.    [provider]    Allergies    Patient has no known allergies.  Review of Systems   Review of Systems  Constitutional:  Negative for fever.  Eyes:  Negative for visual disturbance.  Respiratory:  Negative for shortness of breath.   Cardiovascular:  Negative for syncope.  Gastrointestinal:  Negative for blood in stool, diarrhea, nausea and vomiting.  Genitourinary:  Negative for dysuria.  Neurological:  Positive for dizziness. Negative for syncope, weakness and headaches.  All other systems reviewed and are negative.  Physical Exam Updated Vital Signs BP 132/81   Pulse 62   Temp 98.8 F (37.1 C) (Oral)   Resp (!) 24   Ht 1.803 m (5\' 11" )   Wt 102.1 kg   SpO2 99%   BMI 31.38 kg/m   Physical Exam CONSTITUTIONAL:  Well developed/well nourished HEAD: Normocephalic/atraumatic EYES: EOMI/PERRL, no nystagmus, no ptosis ENMT: Mucous membranes moist NECK: supple no meningeal signs, no bruits CV: S1/S2 noted, no murmurs/rubs/gallops noted LUNGS: Lungs are clear to auscultation bilaterally, no apparent distress ABDOMEN: soft, nontender, no rebound or guarding GU:no cva tenderness NEURO:Awake/alert, face symmetric, no arm or leg drift is noted Equal 5/5 strength with shoulder abduction, elbow flex/extension, wrist flex/extension in upper extremities and equal hand grips bilaterally Equal 5/5 strength with hip flexion,knee flex/extension, foot dorsi/plantar flexion Cranial nerves 3/4/5/6/11/07/08/11/12 tested and intact Gait normal without ataxia No past pointing Sensation to light touch intact in all extremities EXTREMITIES: pulses normal, full ROM SKIN: warm, color normal PSYCH: no abnormalities of mood noted  ED Results / Procedures / Treatments   Labs (all labs ordered are listed, but only abnormal results are displayed) Labs Reviewed  BASIC METABOLIC PANEL - Abnormal; Notable for the following components:      Result Value   Glucose, Bld 127 (*)    Creatinine, Ser 1.41 (*)    GFR, Estimated 53 (*)    All other components within normal limits  CBC    EKG EKG Interpretation  Date/Time:  Friday April 09 2021 13:31:31 EST Ventricular Rate:  74 PR Interval:  252 QRS Duration: 86 QT Interval:  380 QTC Calculation: 421 R Axis:   19 Text Interpretation: Sinus rhythm with 1st degree A-V block Low voltage QRS Cannot rule out Anterior infarct , age undetermined Abnormal ECG Confirmed by Ripley Fraise [phone]) on 04/10/2021 12:37:44 AM  Radiology No results found.  Procedures Procedures   Medications Ordered in ED Medications - No data to display  ED Course  I have reviewed the triage vital signs and the nursing notes.  Pertinent labs results that were available during my care of the  patient were reviewed by me and considered in my medical decision making (see chart for details).    MDM Rules/Calculators/A&P                           Patient is overall very well-appearing on my exam.  He has been in the ER for several hours without any significant symptoms He is ambulatory.  No focal weakness.  EKG is unchanged. No signs of any acute neurologic or cardiac emergency at this time.  He can follow-up as needed.  We discussed strict return precautions Final Clinical Impression(s) / ED Diagnoses Final diagnoses:  Dizziness    Rx / DC Orders ED Discharge Orders     None        Ripley Fraise, MD 04/10/21 [phone]

## 2021-04-10 NOTE — ED Notes (Addendum)
No complaints at this time. No dizzines, No CP, No SOB.  Oct 2022 Ablation for afib. Takes thinners at night.

## 2021-05-18 ENCOUNTER — Ambulatory Visit: Payer: Medicare Other | Admitting: Cardiology

## 2021-05-18 NOTE — Progress Notes (Deleted)
Electrophysiology Office Note   Date:  05/18/2021   ID:  Marvin Chandler, DOB 10-Aug-1950, MRN [id number]  PCP:  [facility]  Cardiologist:  Angelena Form Primary Electrophysiologist:  Marvin Guyett Meredith Leeds, MD    Chief Complaint: AF   History of Present Illness: Marvin Chandler is a 71 y.o. male who is being seen today for the evaluation of AF at the request of [facility]. Presenting today for electrophysiology evaluation.  He has a history significant for nonobstructive coronary artery disease, nonischemic cardiomyopathy, atrial fibrillation, AV block.  He also has a history of atrial fibrillation.  He was discharged from the hospital 07/31/2019 after being treated for acute on chronic systolic heart failure.  His ejection fraction was 25 to 30% at that time.  He was put on goal-directed medical therapy and his ejection fraction improved to 50 to 55%.  He is now status post atrial fibrillation ablation on 02/16/2021.  Today, denies symptoms of palpitations, chest pain, shortness of breath, orthopnea, PND, lower extremity edema, claudication, dizziness, presyncope, syncope, bleeding, or neurologic sequela. The patient is tolerating medications without difficulties. ***    Past Medical History:  Diagnosis Date   Arthritis of hip    bilateral   Asthma    CAD (coronary artery disease)    LHC 5/16:  Dx ostial 30%, LAD luminal irregs, EF 40%   Chronic atrial fibrillation (HCC) 1/54/0086   Chronic systolic CHF (congestive heart failure) ([place])    a. Echo 3/16:  inf and inf-lat HK, mild LVH, EF 45%, mild to mod LAE, normal RVF   Cough    Hiatal hernia    NICM (nonischemic cardiomyopathy) ([place])    Osteoarthritis    Personal history of colonic adenomas 02/20/2013   Pneumonia    Past Surgical History:  Procedure Laterality Date   ATRIAL FIBRILLATION ABLATION N/A 02/16/2021   Procedure: ATRIAL FIBRILLATION ABLATION;  Surgeon: Constance Haw, MD;  Location: [place] CV LAB;  Service: Cardiovascular;  Laterality: N/A;   COLONOSCOPY  02/13/13   LEFT AND RIGHT HEART CATHETERIZATION WITH CORONARY ANGIOGRAM N/A 08/18/2014   Procedure: LEFT AND RIGHT HEART CATHETERIZATION WITH CORONARY ANGIOGRAM;  Surgeon: [name], MD;  Location: [facility] CATH LAB;  Service: Cardiovascular;  Laterality: N/A;   LEFT HEART CATH AND CORONARY ANGIOGRAPHY N/A 11/09/2017   Procedure: LEFT HEART CATH AND CORONARY ANGIOGRAPHY;  Surgeon: [name], MD;  Location: [place] CV LAB;  Service: Cardiovascular;  Laterality: N/A;   TOTAL HIP ARTHROPLASTY  2002   bilateral     Current Outpatient Medications  Medication Sig Dispense Refill   albuterol (PROVENTIL) (2.5 MG/3ML) 0.083% nebulizer solution Take 3 mLs (2.5 mg total) by nebulization every 6 (six) hours as needed for wheezing or shortness of breath. 300 mL 5   albuterol (VENTOLIN HFA) 108 (90 Base) MCG/ACT inhaler Inhale 2 puffs into the lungs every 4 (four) hours as needed. 18 g 6   amiodarone (PACERONE) 200 MG tablet Take 200 mg by mouth at bedtime.     atorvastatin (LIPITOR) 80 MG tablet Take 80 mg by mouth at bedtime.     empagliflozin (JARDIANCE) 25 MG TABS tablet Take 12.5 mg by mouth daily. Take one half tablet daily     EPINEPHrine 0.3 mg/0.3 mL IJ SOAJ injection Inject 0.3 mLs (0.3 mg total) into the muscle once. 1 Device 11   fluticasone (FLONASE) 50 MCG/ACT nasal spray Place 2 sprays into both nostrils daily. 16 g 5  fluticasone-salmeterol (ADVAIR) 250-50 MCG/ACT AEPB Inhale 1 puff into the lungs daily.     furosemide (LASIX) 40 MG tablet Take 40 mg by mouth 2 (two) times daily with a meal.     gabapentin (NEURONTIN) 300 MG capsule Take 300 mg by mouth at bedtime.     isosorbide mononitrate (IMDUR) 30 MG 24 hr tablet Take 1 tablet (30 mg total) by mouth daily. 30 tablet 2   loratadine (CLARITIN) 10 MG tablet Take 10 mg by mouth daily.     metFORMIN (GLUCOPHAGE) 500 MG tablet Take 500 mg by mouth  daily with breakfast.     montelukast (SINGULAIR) 10 MG tablet TAKE 1 TABLET BY MOUTH AT BEDTIME 30 tablet 30   nitroGLYCERIN (NITROSTAT) 0.4 MG SL tablet Place 1 tablet (0.4 mg total) under the tongue every 5 (five) minutes as needed. 25 tablet 3   omeprazole (PRILOSEC) 20 MG capsule Take 40 mg by mouth daily.     polyvinyl alcohol (LIQUIFILM TEARS) 1.4 % ophthalmic solution Place 1 drop into both eyes 4 (four) times daily as needed for dry eyes.     rivaroxaban (XARELTO) 20 MG TABS tablet Take 1 tablet (20 mg total) by mouth daily with supper. 30 tablet 6   sacubitril-valsartan (ENTRESTO) 97-103 MG Take 1 tablet by mouth 2 (two) times daily. 60 tablet 11   spironolactone (ALDACTONE) 25 MG tablet Take 0.5 tablets (12.5 mg total) by mouth daily. 15 tablet 2   terbinafine (LAMISIL) 1 % cream Apply 1 application topically daily as needed (athelete's foot and nail fungus).     terbinafine (LAMISIL) 250 MG tablet Take 250 mg by mouth daily.     No current facility-administered medications for this visit.    Allergies:   Patient has no known allergies.   Social History:  The patient  reports that he quit smoking about 23 years ago. His smoking use included cigarettes. He has a 7.00 pack-year smoking history. He has never used smokeless tobacco. He reports that he does not drink alcohol and does not use drugs.   Family History:  The patient's family history includes Cancer in his maternal aunt and another family member; Dementia in his mother; Diabetes in his cousin; Stroke in his cousin.   ROS:  Please see the history of present illness.   Otherwise, review of systems is positive for none.   All other systems are reviewed and negative.   PHYSICAL EXAM: VS:  There were no vitals taken for this visit. , BMI There is no height or weight on file to calculate BMI. GEN: Well nourished, well developed, in no acute distress  HEENT: normal  Neck: no JVD, carotid bruits, or masses Cardiac: ***RRR; no  murmurs, rubs, or gallops,no edema  Respiratory:  clear to auscultation bilaterally, normal work of breathing GI: soft, nontender, nondistended, + BS MS: no deformity or atrophy  Skin: warm and dry Neuro:  Strength and sensation are intact Psych: euthymic mood, full affect  EKG:  EKG {ACTION; IS/IS [id number]} ordered today. Personal review of the ekg ordered *** shows ***   Recent Labs: 09/25/2020: ALT 17 04/09/2021: BUN 11; Creatinine, Ser 1.41; Hemoglobin 14.5; Platelets 224; Potassium 3.6; Sodium 139    Lipid Panel     Component Value Date/Time   CHOL 112 09/25/2020 0921   TRIG 63 09/25/2020 0921   HDL 39 (L) 09/25/2020 0921   CHOLHDL 2.9 09/25/2020 0921   CHOLHDL 3.8 11/09/2017 0321   VLDL 19 11/09/2017 0321  LDLCALC 59 09/25/2020 0921     Wt Readings from Last 3 Encounters:  04/09/21 225 lb (102.1 kg)  02/16/21 225 lb (102.1 kg)  12/01/20 232 lb (105.2 kg)      Other studies Reviewed: Additional studies/ records that were reviewed today include: TTE 10/23/20  Review of the above records today demonstrates:   1. Left ventricular ejection fraction, by estimation, is 50 to 55%. Left  ventricular ejection fraction by 3D volume is 53 %. The left ventricle has  low normal function. The left ventricle demonstrates regional wall motion  abnormalities (see scoring  diagram/findings for description). Left ventricular diastolic parameters  are indeterminate. There is hypokinesis of the left ventricular, basal-mid  inferoseptal wall. The average left ventricular global longitudinal strain  is -17.4 %. The global  longitudinal strain is normal.   2. Right ventricular systolic function is normal. The right ventricular  size is normal.   3. The mitral valve is normal in structure. Trivial mitral valve  regurgitation. No evidence of mitral stenosis.   4. The aortic valve is tricuspid. There is moderate calcification of the  aortic valve. Aortic valve regurgitation is  trivial. Mild to moderate  aortic valve sclerosis/calcification is present, without any evidence of  aortic stenosis.   5. The inferior vena cava is normal in size with greater than 50%  respiratory variability, suggesting right atrial pressure of 3 mmHg.    ASSESSMENT AND PLAN:  1.  Persistent atrial fibrillation: Currently on amiodarone 200 mg daily, Xarelto 20 mg daily.  CHA2DS2-VASc of at least 2.  High risk medication monitoring for amiodarone via ECG and labs.  Is status post atrial fibrillation ablation 02/16/2021.***  3.  Nonischemic cardiomyopathy: Ejection fraction has improved.  Continue Entresto, Aldactone, Imdur, Lasix per primary cardiology.  3.  Hyperlipidemia: Continue statin per primary cardiology.    Current medicines are reviewed at length with the patient today.   The patient does not have concerns regarding his medicines.  The following changes were made today:  ***  Labs/ tests ordered today include:  No orders of the defined types were placed in this encounter.     Disposition:   FU with Shonette Rhames *** months  Signed, Tyteanna Ost Meredith Leeds, MD  05/18/2021 2:30 PM     [place] [address] [phone] (office) [phone] (fax)

## 2021-05-30 NOTE — Progress Notes (Signed)
Chief Complaint  Patient presents with   Follow-up    Chronic systolic CHF/non-ischemic cardiomyopathy   History of Present Illness:  71 yo male with history of mild CAD, non-ischemic cardiomyopathy, chronic systolic CHF and atrial fibrillation who is here today for follow up. I saw him August 2013 as a new patient for evaluation of chest pain and an abnormal EKG. Stress test in 2013 with no ischemia. Echo 2013 with with normal LVEF 50-55%, mild LVH.  Echo 2015 in setting of pneumonia demonstrated worsening LVEF with EF 45%. He had sharp chest pains as well. Cardiac cath April 2015 demonstrated very mild non-obstructive CAD and mild elevated filling pressures. He was felt to have a non-ischemic cardiomyopathy. Lasix was adjusted and he was placed on Losartan. I saw him in the office 03/07/16 and he was in atrial fibrillation. He was started on Xarelto. His heart rate was not elevated so no rate control agent was started. He was admitted to [facility] in July 2019 with chest pain. Cardiac cath July 2019 with mild proximal LAD stenosis. Echo July 2019 with LVEF=35-40% with diffuse hypokinesis. No significant valve disease. He was admitted to [facility] March 2021 with acute systolic CHF. Echo 2021 with LVEF=25-30% with global hypokinesis, severe akinesis of the entire inferior and inferoseptal wall. He was discharged on Entresto, Imdur, aldactone after being diuresed 3.8 liters. He has been on Eliquis and amiodarone. Noted to have second degree AV block Echo June 2022 with LVEF=50-55%. Atrial fibrillation ablation October 2022.   He is here today for follow up. The patient denies any chest pain, dyspnea, palpitations, lower extremity edema, orthopnea, PND, dizziness, near syncope or syncope. He is feeling well.   Primary Care Physician: [facility]  Past Medical History:  Diagnosis Date   Arthritis of hip    bilateral   Asthma    CAD (coronary artery disease)    LHC 5/16:  Dx ostial 30%, LAD  luminal irregs, EF 40%   Chronic atrial fibrillation (HCC) 01/02/4096   Chronic systolic CHF (congestive heart failure) ([place])    a. Echo 3/16:  inf and inf-lat HK, mild LVH, EF 45%, mild to mod LAE, normal RVF   Cough    Hiatal hernia    NICM (nonischemic cardiomyopathy) ([place])    Osteoarthritis    Personal history of colonic adenomas 02/20/2013   Pneumonia     Past Surgical History:  Procedure Laterality Date   ATRIAL FIBRILLATION ABLATION N/A 02/16/2021   Procedure: ATRIAL FIBRILLATION ABLATION;  Surgeon: Constance Haw, MD;  Location: [place] [facility];  Service: Cardiovascular;  Laterality: N/A;   COLONOSCOPY  02/13/13   LEFT AND RIGHT HEART CATHETERIZATION WITH CORONARY ANGIOGRAM N/A 08/18/2014   Procedure: LEFT AND RIGHT HEART CATHETERIZATION WITH CORONARY ANGIOGRAM;  Surgeon: [name], MD;  Location: [facility] CATH LAB;  Service: Cardiovascular;  Laterality: N/A;   LEFT HEART CATH AND CORONARY ANGIOGRAPHY N/A 11/09/2017   Procedure: LEFT HEART CATH AND CORONARY ANGIOGRAPHY;  Surgeon: [name], MD;  Location: [place] [facility];  Service: Cardiovascular;  Laterality: N/A;   TOTAL HIP ARTHROPLASTY  2002   bilateral    Current Outpatient Medications  Medication Sig Dispense Refill   albuterol (PROVENTIL) (2.5 MG/3ML) 0.083% nebulizer solution Take 3 mLs (2.5 mg total) by nebulization every 6 (six) hours as needed for wheezing or shortness of breath. 300 mL 5   albuterol (VENTOLIN HFA) 108 (90 Base) MCG/ACT inhaler Inhale 2 puffs into the lungs  every 4 (four) hours as needed. 18 g 6   amiodarone (PACERONE) 200 MG tablet Take 200 mg by mouth at bedtime.     atorvastatin (LIPITOR) 80 MG tablet Take 80 mg by mouth at bedtime.     empagliflozin (JARDIANCE) 25 MG TABS tablet Take 12.5 mg by mouth daily. Take one half tablet daily     EPINEPHrine 0.3 mg/0.3 mL IJ SOAJ injection Inject 0.3 mLs (0.3 mg total) into the muscle once. 1 Device 11   fluticasone (FLONASE) 50  MCG/ACT nasal spray Place 2 sprays into both nostrils daily. 16 g 5   fluticasone-salmeterol (ADVAIR) 250-50 MCG/ACT AEPB Inhale 1 puff into the lungs daily.     furosemide (LASIX) 40 MG tablet Take 40 mg by mouth 2 (two) times daily with a meal.     gabapentin (NEURONTIN) 300 MG capsule Take 300 mg by mouth at bedtime.     isosorbide mononitrate (IMDUR) 30 MG 24 hr tablet Take 1 tablet (30 mg total) by mouth daily. 30 tablet 2   loratadine (CLARITIN) 10 MG tablet Take 10 mg by mouth daily.     metFORMIN (GLUCOPHAGE) 500 MG tablet Take 500 mg by mouth daily with breakfast.     montelukast (SINGULAIR) 10 MG tablet TAKE 1 TABLET BY MOUTH AT BEDTIME 30 tablet 30   nitroGLYCERIN (NITROSTAT) 0.4 MG SL tablet Place 1 tablet (0.4 mg total) under the tongue every 5 (five) minutes as needed. 25 tablet 3   omeprazole (PRILOSEC) 20 MG capsule Take 40 mg by mouth daily.     polyvinyl alcohol (LIQUIFILM TEARS) 1.4 % ophthalmic solution Place 1 drop into both eyes 4 (four) times daily as needed for dry eyes.     rivaroxaban (XARELTO) 20 MG TABS tablet Take 1 tablet (20 mg total) by mouth daily with supper. 30 tablet 6   sacubitril-valsartan (ENTRESTO) 97-103 MG Take 1 tablet by mouth 2 (two) times daily. 60 tablet 11   spironolactone (ALDACTONE) 25 MG tablet Take 0.5 tablets (12.5 mg total) by mouth daily. 15 tablet 2   terbinafine (LAMISIL) 1 % cream Apply 1 application topically daily as needed (athelete's foot and nail fungus).     terbinafine (LAMISIL) 250 MG tablet Take 250 mg by mouth daily.     No current facility-administered medications for this visit.    No Known Allergies  Social History   Socioeconomic History   Marital status: Married    Spouse name: Not on file   Number of children: 2   Years of education: Not on file   Highest education level: Not on file  Occupational History   Occupation: Softball coach Advertising account executive: [employer]  Tobacco Use   Smoking  status: Former    Packs/day: 1.00    Years: 7.00    Pack years: 7.00    Types: Cigarettes    Quit date: 11/05/1997    Years since quitting: 23.5   Smokeless tobacco: Never  Vaping Use   Vaping Use: Never used  Substance and Sexual Activity   Alcohol use: No    Alcohol/week: 0.0 standard drinks   Drug use: No   Sexual activity: Not on file  Other Topics Concern   Not on file  Social History Narrative   Not on file   Social Determinants of Health   Financial Resource Strain: Not on file  Food Insecurity: Not on file  Transportation Needs: Not on file  Physical Activity: Not on  file  Stress: Not on file  Social Connections: Not on file  Intimate Partner Violence: Not on file    Family History  Problem Relation Age of Onset   Dementia Mother    Cancer Other    Stroke Cousin    Cancer Maternal Aunt    Diabetes Cousin    Colon cancer Neg Hx    Esophageal cancer Neg Hx    Heart attack Neg Hx    Hypertension Neg Hx     Review of Systems:  As stated in the HPI and otherwise negative.   BP 130/72    Pulse 75    Ht 5\' 11"  (1.803 m)    Wt 229 lb 6.4 oz (104.1 kg)    SpO2 98%    BMI 31.99 kg/m   Physical Examination:  General: Well developed, well nourished, NAD  HEENT: OP clear, mucus membranes moist  SKIN: warm, dry. No rashes. Neuro: No focal deficits  Musculoskeletal: Muscle strength 5/5 all ext  Psychiatric: Mood and affect normal  Neck: No JVD, no carotid bruits, no thyromegaly, no lymphadenopathy.  Lungs:Clear bilaterally, no wheezes, rhonci, crackles Cardiovascular: Regular rate and rhythm. No murmurs, gallops or rubs. Abdomen:Soft. Bowel sounds present. Non-tender.  Extremities: No lower extremity edema. Pulses are 2 + in the bilateral DP/PT.  Echo June 2021:  1. Left ventricular ejection fraction, by estimation, is 50 to 55%. Left  ventricular ejection fraction by 3D volume is 53 %. The left ventricle has  low normal function. The left ventricle  demonstrates regional wall motion  abnormalities (see scoring  diagram/findings for description). Left ventricular diastolic parameters  are indeterminate. There is hypokinesis of the left ventricular, basal-mid  inferoseptal wall. The average left ventricular global longitudinal strain  is -17.4 %. The global  longitudinal strain is normal.   2. Right ventricular systolic function is normal. The right ventricular  size is normal.   3. The mitral valve is normal in structure. Trivial mitral valve  regurgitation. No evidence of mitral stenosis.   4. The aortic valve is tricuspid. There is moderate calcification of the  aortic valve. Aortic valve regurgitation is trivial. Mild to moderate  aortic valve sclerosis/calcification is present, without any evidence of  aortic stenosis.   5. The inferior vena cava is normal in size with greater than 50%  respiratory variability, suggesting right atrial pressure of 3 mmHg.   EKG:  EKG is not ordered today. The ekg ordered today demonstrates    Recent Labs: 09/25/2020: ALT 17 04/09/2021: BUN 11; Creatinine, Ser 1.41; Hemoglobin 14.5; Platelets 224; Potassium 3.6; Sodium 139   Lipid Panel    Component Value Date/Time   CHOL 112 09/25/2020 0921   TRIG 63 09/25/2020 0921   HDL 39 (L) 09/25/2020 0921   CHOLHDL 2.9 09/25/2020 0921   CHOLHDL 3.8 11/09/2017 0321   VLDL 19 11/09/2017 0321   LDLCALC 59 09/25/2020 0921     Wt Readings from Last 3 Encounters:  05/31/21 229 lb 6.4 oz (104.1 kg)  04/09/21 225 lb (102.1 kg)  02/16/21 225 lb (102.1 kg)     Other studies Reviewed: Additional studies/ records that were reviewed today include: . Review of the above records demonstrates:   Assessment and Plan:   1. CAD without angina: He has mild CAD by cath in July 2019. No chest pain. Continue statin. No ASA since he is on Xarelto.    2. Non-ischemic Cardiomyopathy: LVEF=55% by echo 2022. No beta blocker due to AV block/conduction  disease.  Continue Entresto and aldactone.  3. Chronic systolic CHF: Weight is stable. Continue Lasix.   4. Atrial fibrillation, paroxysmal: Followed in EP clinic. Sinus today on exam.  Continue Xarelto. Question if he should stay on amiodarone. He had his f/u with Dr. Lennie Odor cancelled by the [facility]. He wishes to follow up in the EP clinic. Will set that up today.   5. Hyperlipidemia: LDL at goal in May 2022. Continue statin.   Current medicines are reviewed at length with the patient today.  The patient does not have concerns regarding medicines.  The following changes have been made:  no change  Labs/ tests ordered today include:   No orders of the defined types were placed in this encounter.   Disposition:   F/U with me in 12 months  Signed, Lauree Chandler, MD 05/31/2021 1:34 PM    [facility] [place], [place], [place]  [address] Phone: [phone]; Fax: [phone]

## 2021-05-31 ENCOUNTER — Ambulatory Visit (INDEPENDENT_AMBULATORY_CARE_PROVIDER_SITE_OTHER): Payer: Medicare Other | Admitting: Cardiovascular Disease

## 2021-05-31 ENCOUNTER — Other Ambulatory Visit: Payer: Self-pay

## 2021-05-31 ENCOUNTER — Encounter: Payer: Self-pay | Admitting: Cardiovascular Disease

## 2021-05-31 VITALS — BP 130/72 | HR 75 | Ht 71.0 in | Wt 229.4 lb

## 2021-05-31 DIAGNOSIS — I2510 Atherosclerotic heart disease of native coronary artery without angina pectoris: Secondary | ICD-10-CM | POA: Diagnosis not present

## 2021-05-31 DIAGNOSIS — E7800 Pure hypercholesterolemia, unspecified: Secondary | ICD-10-CM

## 2021-05-31 DIAGNOSIS — I5022 Chronic systolic (congestive) heart failure: Secondary | ICD-10-CM

## 2021-05-31 DIAGNOSIS — I480 Paroxysmal atrial fibrillation: Secondary | ICD-10-CM

## 2021-05-31 DIAGNOSIS — I428 Other cardiomyopathies: Secondary | ICD-10-CM

## 2021-05-31 NOTE — Patient Instructions (Signed)
Medication Instructions:  Your physician recommends that you continue on your current medications as directed. Please refer to the Current Medication list given to you today.  *If you need a refill on your cardiac medications before your next appointment, please call your pharmacy*   Lab Work: None ordered   If you have labs (blood work) drawn today and your tests are completely normal, you will receive your results only by: [place] (if you have MyChart) OR A paper copy in the mail If you have any lab test that is abnormal or we need to change your treatment, we will call you to review the results.   Testing/Procedures: None ordered    Follow-Up: At [facility], you and your health needs are our priority.  As part of our continuing mission to provide you with exceptional heart care, we have created designated Provider Care Teams.  These Care Teams include your primary Cardiologist (physician) and Advanced Practice Providers (APPs -  Physician Assistants and Nurse Practitioners) who all work together to provide you with the care you need, when you need it.  We recommend signing up for the patient portal called "MyChart".  Sign up information is provided on this After Visit Summary.  MyChart is used to connect with patients for Virtual Visits (Telemedicine).  Patients are able to view lab/test results, encounter notes, upcoming appointments, etc.  Non-urgent messages can be sent to your provider as well.   To learn more about what you can do with MyChart, go to [email or web address].    Your next appointment:   12 month(s)  The format for your next appointment:   In Person  Provider:   Lauree Chandler, MD     Follow up with Dr. Allegra Lai at his next available appointment    Other Instructions None ordered

## 2021-08-10 ENCOUNTER — Encounter: Payer: Self-pay | Admitting: Cardiology

## 2021-08-10 ENCOUNTER — Ambulatory Visit (INDEPENDENT_AMBULATORY_CARE_PROVIDER_SITE_OTHER): Payer: Medicare Other | Admitting: Cardiology

## 2021-08-10 VITALS — BP 126/72 | HR 75 | Ht 71.0 in | Wt 220.2 lb

## 2021-08-10 DIAGNOSIS — I4819 Other persistent atrial fibrillation: Secondary | ICD-10-CM

## 2021-08-10 DIAGNOSIS — I428 Other cardiomyopathies: Secondary | ICD-10-CM

## 2021-08-10 DIAGNOSIS — I2510 Atherosclerotic heart disease of native coronary artery without angina pectoris: Secondary | ICD-10-CM | POA: Diagnosis not present

## 2021-08-10 DIAGNOSIS — E7800 Pure hypercholesterolemia, unspecified: Secondary | ICD-10-CM | POA: Diagnosis not present

## 2021-08-10 NOTE — Patient Instructions (Signed)
Medication Instructions:  ?Stop Amiodarone ?Your physician recommends that you continue on your current medications as directed. Please refer to the Current Medication list given to you today. ?*If you need a refill on your cardiac medications before your next appointment, please call your pharmacy* ? ?Lab Work: ?None. ?If you have labs (blood work) drawn today and your tests are completely normal, you will receive your results only by: ?MyChart Message (if you have MyChart) OR ?A paper copy in the mail ?If you have any lab test that is abnormal or we need to change your treatment, we will call you to review the results. ? ?Testing/Procedures: ?None. ? ?Follow-Up: ?At [facility], you and your health needs are our priority.  As part of our continuing mission to provide you with exceptional heart care, we have created designated Provider Care Teams.  These Care Teams include your primary Cardiologist (physician) and Advanced Practice Providers (APPs -  Physician Assistants and Nurse Practitioners) who all work together to provide you with the care you need, when you need it. ? ?Your physician wants you to follow-up in: 6 months with one of the following Advanced Practice Providers on your designated Care Team:   ? ?Tommye Standard, PA-C ?Legrand Como "Jonni Sanger" [place], PA-C ?  You will receive a reminder letter in the mail two months in advance. If you don't receive a letter, please call our office to schedule the follow-up appointment. ? ?We recommend signing up for the patient portal called "MyChart".  Sign up information is provided on this After Visit Summary.  MyChart is used to connect with patients for Virtual Visits (Telemedicine).  Patients are able to view lab/test results, encounter notes, upcoming appointments, etc.  Non-urgent messages can be sent to your provider as well.   ?To learn more about what you can do with MyChart, go to [email or web address].   ? ?Any Other Special Instructions Will Be Listed Below  (If Applicable). ? ? ? ? ?  ? ? ?

## 2021-08-10 NOTE — Progress Notes (Signed)
? ?Electrophysiology Office Note ? ? ?Date:  08/10/2021  ? ?ID:  Marvin Chandler, DOB 07-Mar-1951, MRN [id number] ? ?PCP:  [facility]  ?Cardiologist:  Angelena Form ?Primary Electrophysiologist:  Pinki Rottman Meredith Leeds, MD   ? ?Chief Complaint: AF ?  ?History of Present Illness: ?Marvin Chandler is a 71 y.o. male who is being seen today for the evaluation of AF at the request of [facility]. Presenting today for electrophysiology evaluation. ? ?He has a history significant for nonobstructive coronary artery disease, nonischemic cardiomyopathy, atrial fibrillation, [place] 1 AV block.  He was discharged from the hospital 07/31/2019 after being treated for acute heart failure and systolic heart failure.  His ejection fraction was found to be 25 to 30%.  With maintenance of sinus rhythm his ejection fraction is improved.  He is status post atrial fibrillation ablation 05/19/2020. ? ?Today, denies symptoms of palpitations, chest pain, shortness of breath, orthopnea, PND, lower extremity edema, claudication, dizziness, presyncope, syncope, bleeding, or neurologic sequela. The patient is tolerating medications without difficulties.  His ablation he has done well.  He has had no chest pain or shortness of breath.  He is able do all of his daily activities.  He has noted no further episodes of atrial fibrillation and is overall happy with his control. ? ? ?Past Medical History:  ?Diagnosis Date  ? Arthritis of hip   ? bilateral  ? Asthma   ? CAD (coronary artery disease)   ? LHC 5/16:  Dx ostial 30%, LAD luminal irregs, EF 40%  ? Chronic atrial fibrillation ([place]) 07/29/2019  ? Chronic systolic CHF (congestive heart failure) ([place])   ? a. Echo 3/16:  inf and inf-lat HK, mild LVH, EF 45%, mild to mod LAE, normal RVF  ? Cough   ? Hiatal hernia   ? NICM (nonischemic cardiomyopathy) ([place])   ? Osteoarthritis   ? Personal history of colonic adenomas 02/20/2013  ? Pneumonia   ? ?Past Surgical History:  ?Procedure  Laterality Date  ? ATRIAL FIBRILLATION ABLATION N/A 02/16/2021  ? Procedure: ATRIAL FIBRILLATION ABLATION;  Surgeon: Constance Haw, MD;  Location: [place] [facility];  Service: Cardiovascular;  Laterality: N/A;  ? COLONOSCOPY  02/13/13  ? LEFT AND RIGHT HEART CATHETERIZATION WITH CORONARY ANGIOGRAM N/A 08/18/2014  ? Procedure: LEFT AND RIGHT HEART CATHETERIZATION WITH CORONARY ANGIOGRAM;  Surgeon: [name], MD;  Location: [facility] CATH LAB;  Service: Cardiovascular;  Laterality: N/A;  ? LEFT HEART CATH AND CORONARY ANGIOGRAPHY N/A 11/09/2017  ? Procedure: LEFT HEART CATH AND CORONARY ANGIOGRAPHY;  Surgeon: [name], MD;  Location: [place] [facility];  Service: Cardiovascular;  Laterality: N/A;  ? TOTAL HIP ARTHROPLASTY  2002  ? bilateral  ? ? ? ?Current Outpatient Medications  ?Medication Sig Dispense Refill  ? albuterol (PROVENTIL) (2.5 MG/3ML) 0.083% nebulizer solution Take 3 mLs (2.5 mg total) by nebulization every 6 (six) hours as needed for wheezing or shortness of breath. 300 mL 5  ? albuterol (VENTOLIN HFA) 108 (90 Base) MCG/ACT inhaler Inhale 2 puffs into the lungs every 4 (four) hours as needed. 18 g 6  ? atorvastatin (LIPITOR) 80 MG tablet Take 80 mg by mouth at bedtime.    ? empagliflozin (JARDIANCE) 25 MG TABS tablet Take 12.5 mg by mouth daily. Take one half tablet daily    ? EPINEPHrine 0.3 mg/0.3 mL IJ SOAJ injection Inject 0.3 mLs (0.3 mg total) into the muscle once. 1 Device 11  ? fluticasone (FLONASE) 50  MCG/ACT nasal spray Place 2 sprays into both nostrils daily. 16 g 5  ? fluticasone-salmeterol (ADVAIR) 250-50 MCG/ACT AEPB Inhale 1 puff into the lungs daily.    ? furosemide (LASIX) 40 MG tablet Take 40 mg by mouth 2 (two) times daily with a meal.    ? gabapentin (NEURONTIN) 300 MG capsule Take 300 mg by mouth at bedtime.    ? isosorbide mononitrate (IMDUR) 30 MG 24 hr tablet Take 1 tablet (30 mg total) by mouth daily. 30 tablet 2  ? loratadine (CLARITIN) 10 MG tablet Take 10 mg  by mouth daily.    ? metFORMIN (GLUCOPHAGE) 500 MG tablet Take 500 mg by mouth daily with breakfast.    ? montelukast (SINGULAIR) 10 MG tablet TAKE 1 TABLET BY MOUTH AT BEDTIME 30 tablet 30  ? nitroGLYCERIN (NITROSTAT) 0.4 MG SL tablet Place 1 tablet (0.4 mg total) under the tongue every 5 (five) minutes as needed. 25 tablet 3  ? omeprazole (PRILOSEC) 20 MG capsule Take 40 mg by mouth daily.    ? polyvinyl alcohol (LIQUIFILM TEARS) 1.4 % ophthalmic solution Place 1 drop into both eyes 4 (four) times daily as needed for dry eyes.    ? rivaroxaban (XARELTO) 20 MG TABS tablet Take 1 tablet (20 mg total) by mouth daily with supper. 30 tablet 6  ? sacubitril-valsartan (ENTRESTO) 97-103 MG Take 1 tablet by mouth 2 (two) times daily. 60 tablet 11  ? spironolactone (ALDACTONE) 25 MG tablet Take 0.5 tablets (12.5 mg total) by mouth daily. 15 tablet 2  ? terbinafine (LAMISIL) 1 % cream Apply 1 application topically daily as needed (athelete's foot and nail fungus).    ? terbinafine (LAMISIL) 250 MG tablet Take 250 mg by mouth daily.    ? ?No current facility-administered medications for this visit.  ? ? ?Allergies:   Patient has no known allergies.  ? ?Social History:  The patient  reports that he quit smoking about 23 years ago. His smoking use included cigarettes. He has a 7.00 pack-year smoking history. He has never used smokeless tobacco. He reports that he does not drink alcohol and does not use drugs.  ? ?Family History:  The patient's family history includes Cancer in his maternal aunt and another family member; Dementia in his mother; Diabetes in his cousin; Stroke in his cousin.  ? ? ?ROS:  Please see the history of present illness.   Otherwise, review of systems is positive for none.   All other systems are reviewed and negative.  ? ?PHYSICAL EXAM: ?VS:  BP 126/72   Pulse 75   Ht '5\' 11"'$  (1.803 m)   Wt 220 lb 3.2 oz (99.9 kg)   SpO2 95%   BMI 30.71 kg/m?  , BMI Body mass index is 30.71 kg/m?. ?GEN: Well  nourished, well developed, in no acute distress  ?HEENT: normal  ?Neck: no JVD, carotid bruits, or masses ?Cardiac: RRR; no murmurs, rubs, or gallops,no edema  ?Respiratory:  clear to auscultation bilaterally, normal work of breathing ?GI: soft, nontender, nondistended, + BS ?MS: no deformity or atrophy  ?Skin: warm and dry ?Neuro:  Strength and sensation are intact ?Psych: euthymic mood, full affect ? ?EKG:  EKG is ordered today. ?Personal review of the ekg ordered shows sinus rhythm, first-degree AV block, PVC ? ? ?Recent Labs: ?09/25/2020: ALT 17 ?04/09/2021: BUN 11; Creatinine, Ser 1.41; Hemoglobin 14.5; Platelets 224; Potassium 3.6; Sodium 139  ? ? ?Lipid Panel  ?   ?Component Value Date/Time  ?  CHOL 112 09/25/2020 0921  ? TRIG 63 09/25/2020 0921  ? HDL 39 (L) 09/25/2020 0921  ? CHOLHDL 2.9 09/25/2020 0921  ? CHOLHDL 3.8 11/09/2017 0321  ? VLDL 19 11/09/2017 0321  ? [place] 59 09/25/2020 0921  ? ? ? ?Wt Readings from Last 3 Encounters:  ?08/10/21 220 lb 3.2 oz (99.9 kg)  ?05/31/21 229 lb 6.4 oz (104.1 kg)  ?04/09/21 225 lb (102.1 kg)  ?  ? ? ?Other studies Reviewed: ?Additional studies/ records that were reviewed today include: TTE 10/23/20  ?Review of the above records today demonstrates:  ? 1. Left ventricular ejection fraction, by estimation, is 50 to 55%. Left  ?ventricular ejection fraction by 3D volume is 53 %. The left ventricle has  ?low normal function. The left ventricle demonstrates regional wall motion  ?abnormalities (see scoring  ?diagram/findings for description). Left ventricular diastolic parameters  ?are indeterminate. There is hypokinesis of the left ventricular, basal-mid  ?inferoseptal wall. The average left ventricular global longitudinal strain  ?is -17.4 %. The global  ?longitudinal strain is normal.  ? 2. Right ventricular systolic function is normal. The right ventricular  ?size is normal.  ? 3. The mitral valve is normal in structure. Trivial mitral valve  ?regurgitation. No evidence of  mitral stenosis.  ? 4. The aortic valve is tricuspid. There is moderate calcification of the  ?aortic valve. Aortic valve regurgitation is trivial. Mild to moderate  ?aortic valve sclerosis/calcification is present, wit

## 2021-09-14 ENCOUNTER — Ambulatory Visit: Payer: Medicare Other | Admitting: Cardiovascular Disease

## 2022-03-03 ENCOUNTER — Other Ambulatory Visit (HOSPITAL_COMMUNITY): Payer: Self-pay | Admitting: Internal Medicine

## 2022-03-03 DIAGNOSIS — D7389 Other diseases of spleen: Secondary | ICD-10-CM

## 2022-03-17 HISTORY — PX: FEMORAL ARTERY STENT: SHX1583

## 2022-03-30 ENCOUNTER — Encounter (HOSPITAL_COMMUNITY): Payer: Self-pay | Admitting: Orthopedic Surgery

## 2022-04-07 HISTORY — PX: POPLITEAL ARTERY STENT: SHX2243

## 2022-04-18 ENCOUNTER — Encounter (HOSPITAL_COMMUNITY): Payer: Self-pay

## 2022-04-27 ENCOUNTER — Other Ambulatory Visit (HOSPITAL_COMMUNITY): Payer: Self-pay | Admitting: Internal Medicine

## 2022-04-27 DIAGNOSIS — D7389 Other diseases of spleen: Secondary | ICD-10-CM

## 2022-04-27 DIAGNOSIS — M999 Biomechanical lesion, unspecified: Secondary | ICD-10-CM

## 2022-05-07 ENCOUNTER — Ambulatory Visit (HOSPITAL_COMMUNITY)
Admission: RE | Admit: 2022-05-07 | Discharge: 2022-05-07 | Disposition: A | Discharge status: Home / Self Care | Payer: No Typology Code available for payment source | Source: Ambulatory Visit | Attending: Internal Medicine | Admitting: Internal Medicine

## 2022-05-07 ENCOUNTER — Encounter (HOSPITAL_COMMUNITY): Payer: Self-pay

## 2022-05-07 DIAGNOSIS — M999 Biomechanical lesion, unspecified: Secondary | ICD-10-CM

## 2022-05-07 DIAGNOSIS — D7389 Other diseases of spleen: Secondary | ICD-10-CM

## 2022-05-07 MED ORDER — GADOBUTROL 1 MMOL/ML IV SOLN: 10.0000 mL | Freq: Once | INTRAVENOUS | Status: DC | PRN

## 2022-05-13 ENCOUNTER — Ambulatory Visit (HOSPITAL_COMMUNITY)
Admission: RE | Admit: 2022-05-13 | Discharge: 2022-05-13 | Disposition: A | Discharge status: Home / Self Care | Payer: No Typology Code available for payment source | Source: Ambulatory Visit | Attending: Internal Medicine | Admitting: Internal Medicine

## 2022-05-13 DIAGNOSIS — M999 Biomechanical lesion, unspecified: Secondary | ICD-10-CM | POA: Insufficient documentation

## 2022-05-13 DIAGNOSIS — D7389 Other diseases of spleen: Secondary | ICD-10-CM | POA: Insufficient documentation

## 2022-05-13 MED ORDER — GADOBUTROL 1 MMOL/ML IV SOLN
9.7000 mL | Freq: Once | INTRAVENOUS | Status: AC | PRN
  Administered 2022-05-13: 9.7 mL via INTRAVENOUS

## 2022-06-10 ENCOUNTER — Ambulatory Visit (INDEPENDENT_AMBULATORY_CARE_PROVIDER_SITE_OTHER): Payer: No Typology Code available for payment source | Admitting: Orthopaedic Surgery

## 2022-06-10 DIAGNOSIS — M25552 Pain in left hip: Secondary | ICD-10-CM

## 2022-06-10 NOTE — Progress Notes (Signed)
Office Visit Note   Patient: Marvin Chandler           Date of Birth: [birth year]           MRN: [id number] Visit Date: 06/10/2022              Requested by: [facility] [address] [place],  [place] [address] PCP: [facility]: Visit Diagnoses:  1. Pain of left hip     Plan: Given these findings will need to rule out infection.  Will obtain inflammatory markers today.  We will also set up hip aspiration using Synovasure.  We will coordinate this with the patient and Dr. Rolena Infante.  Follow-Up Instructions: No follow-ups on file.   Orders:  Orders Placed This Encounter  Procedures   C-reactive protein   CBC with Differential   Sed Rate (ESR)   No orders of the defined types were placed in this encounter.     Procedures: No procedures performed   Clinical Data: No additional findings.   Subjective: Chief Complaint  Patient presents with   Left Hip - Pain    HPI Marvin Chandler is a 72 year old gentleman here for evaluation of chronic left hip pain for about 3 years.  He underwent hip replacement in [place] about 20 years ago and recently felt worsening pain.  He has done physical therapy without any relief.  He has groin pain and upper thigh pain.  Denies any constitutional symptoms.  Had an MRI recently which showed a large fluid collection around the left hip prosthesis.  Review of Systems  Constitutional: Negative.   HENT: Negative.    Eyes: Negative.   Respiratory: Negative.    Cardiovascular: Negative.   Gastrointestinal: Negative.   Endocrine: Negative.   Genitourinary: Negative.   Skin: Negative.   Allergic/Immunologic: Negative.   Neurological: Negative.   Hematological: Negative.   Psychiatric/Behavioral: Negative.    All other systems reviewed and are negative.    Objective: Vital Signs: There were no vitals taken for this visit.  Physical Exam Vitals and nursing note reviewed.   Constitutional:      Appearance: He is well-developed.  HENT:     Head: Normocephalic and atraumatic.  Eyes:     Pupils: Pupils are equal, round, and reactive to light.  Pulmonary:     Effort: Pulmonary effort is normal.  Abdominal:     Palpations: Abdomen is soft.  Musculoskeletal:        General: Normal range of motion.     Cervical back: Neck supple.  Skin:    General: Skin is warm.  Neurological:     Mental Status: He is alert and oriented to person, place, and time.  Psychiatric:        Behavior: Behavior normal.        Thought Content: Thought content normal.        Judgment: Judgment normal.     Ortho Exam Examination left hip shows a fully healed surgical scar.  He has pain with hip range of motion.  There is no warmth or redness to the thigh or the hip. Specialty Comments:  No specialty comments available.  Imaging: No results found.   PMFS History: Patient Active Problem List   Diagnosis Date Noted   OSA (obstructive sleep apnea) 07/16/2020   Non-Obstructive CAD 07/31/2019   Demand ischemia 07/31/2019   Dyslipidemia 07/31/2019   Acute on chronic systolic CHF (congestive heart failure) ([place]) 07/29/2019  Persistent atrial fibrillation ([facility]) 99991111   Acute systolic CHF (congestive heart failure) ([place]) 07/29/2019   Non-cardiac chest pain 11/08/2017   Asthma with acute exacerbation 10/06/2014   History of asthma 08/15/2014   Hemoptysis 08/15/2014   Chronic cough 08/15/2014   Cardiomyopathy ([place]) 08/13/2014   Asthma exacerbation 07/16/2014   Pleuritic chest pain 07/16/2014   Asthma, chronic 02/19/2014   Personal history of colonic adenomas 02/20/2013   Dyspnea 12/16/2011   Chest pain with moderate risk for cardiac etiology 12/16/2011   HIATAL HERNIA 07/11/2006   OSTEOARTHRITIS 07/11/2006   ARTHRITIS, HIPS, BILATERAL 07/11/2006   Past Medical History:  Diagnosis Date   Arthritis of hip    bilateral   Asthma    CAD (coronary artery disease)     LHC 5/16:  Dx ostial 30%, LAD luminal irregs, EF 40%   Chronic atrial fibrillation ([facility]) A999333   Chronic systolic CHF (congestive heart failure) ([place])    a. Echo 3/16:  inf and inf-lat HK, mild LVH, EF 45%, mild to mod LAE, normal RVF   Cough    Hiatal hernia    NICM (nonischemic cardiomyopathy) ([place])    Osteoarthritis    Personal history of colonic adenomas 02/20/2013   Pneumonia     Family History  Problem Relation Age of Onset   Dementia Mother    Cancer Other    Stroke Cousin    Cancer Maternal Aunt    Diabetes Cousin    Colon cancer Neg Hx    Esophageal cancer Neg Hx    Heart attack Neg Hx    Hypertension Neg Hx     Past Surgical History:  Procedure Laterality Date   ATRIAL FIBRILLATION ABLATION N/A 02/16/2021   Procedure: ATRIAL FIBRILLATION ABLATION;  Surgeon: Constance Haw, MD;  Location: [facility];  Service: Cardiovascular;  Laterality: N/A;   COLONOSCOPY  02/13/2013   FEMORAL ARTERY STENT Left 03/17/2022   LEFT AND RIGHT HEART CATHETERIZATION WITH CORONARY ANGIOGRAM N/A 08/18/2014   Procedure: LEFT AND RIGHT HEART CATHETERIZATION WITH CORONARY ANGIOGRAM;  Surgeon: [name], MD;  Location: [facility] CATH LAB;  Service: Cardiovascular;  Laterality: N/A;   LEFT HEART CATH AND CORONARY ANGIOGRAPHY N/A 11/09/2017   Procedure: LEFT HEART CATH AND CORONARY ANGIOGRAPHY;  Surgeon: [name], MD;  Location: [facility];  Service: Cardiovascular;  Laterality: N/A;   POPLITEAL ARTERY STENT Right 04/07/2022   TOTAL HIP ARTHROPLASTY  05/02/2000   bilateral   Social History   Occupational History   Occupation: Scientist, water quality: [employer]  Tobacco Use   Smoking status: Former    Packs/day: 1.00    Years: 7.00    Total pack years: 7.00    Types: Cigarettes    Quit date: 11/05/1997    Years since quitting: 24.6   Smokeless tobacco: Never  Vaping Use   Vaping Use: Never used  Substance and Sexual  Activity   Alcohol use: No    Alcohol/week: 0.0 standard drinks of alcohol   Drug use: No   Sexual activity: Not on file

## 2022-06-11 LAB — CBC WITH DIFFERENTIAL/PLATELET
Absolute Monocytes: 550 cells/uL (ref 200–950)
Basophils Absolute: 38 cells/uL (ref 0–200)
Basophils Relative: 0.6 %
Eosinophils Absolute: 102 cells/uL (ref 15–500)
Eosinophils Relative: 1.6 %
HCT: 44.4 % (ref 38.5–50.0)
Hemoglobin: 14.6 g/dL (ref 13.2–17.1)
Lymphs Abs: 1952 cells/uL (ref 850–3900)
MCH: 28.6 pg (ref 27.0–33.0)
MCHC: 32.9 g/dL (ref 32.0–36.0)
MCV: 87.1 fL (ref 80.0–100.0)
MPV: 11.1 fL (ref 7.5–12.5)
Monocytes Relative: 8.6 %
Neutro Abs: 3757 cells/uL (ref 1500–7800)
Neutrophils Relative %: 58.7 %
Platelets: 283 10*3/uL (ref 140–400)
RBC: 5.1 10*6/uL (ref 4.20–5.80)
RDW: 13.6 % (ref 11.0–15.0)
Total Lymphocyte: 30.5 %
WBC: 6.4 10*3/uL (ref 3.8–10.8)

## 2022-06-11 LAB — C-REACTIVE PROTEIN: CRP: 2.7 mg/L (ref <8.0)

## 2022-06-11 LAB — SEDIMENTATION RATE: Sed Rate: 6 mm/h (ref 0–20)

## 2022-07-07 ENCOUNTER — Other Ambulatory Visit: Payer: Self-pay

## 2022-07-07 DIAGNOSIS — M25552 Pain in left hip: Secondary | ICD-10-CM

## 2022-07-14 ENCOUNTER — Ambulatory Visit (INDEPENDENT_AMBULATORY_CARE_PROVIDER_SITE_OTHER): Payer: No Typology Code available for payment source | Admitting: Sports Medicine

## 2022-07-14 ENCOUNTER — Other Ambulatory Visit: Payer: Self-pay

## 2022-07-14 DIAGNOSIS — M25552 Pain in left hip: Secondary | ICD-10-CM | POA: Diagnosis not present

## 2022-07-14 DIAGNOSIS — Z96642 Presence of left artificial hip joint: Secondary | ICD-10-CM

## 2022-07-14 NOTE — Progress Notes (Signed)
   Procedure Note  Patient: Marvin Chandler             Date of Birth: 05-24-1950           MRN: [id number]             Visit Date: 07/14/2022  HPI: Marvin Chandler presents today for ultrasound-guided hip aspiration.  Does have a total hip arthroplasty of the left hip, having pain. Sent by my partner, Dr. Erlinda Hong  Procedures: Visit Diagnoses:  1. Pain of left hip   2. Status post total replacement of left hip    Large Joint Inj: L hip joint on 07/15/2022 8:22 AM Indications: pain and diagnostic evaluation Details: 18 G 3.5 in needle, ultrasound-guided anterior approach Medications: 6 mL lidocaine 1 % Aspirate: 5 mL serous and bloody; sent for lab analysis Outcome: tolerated well, no immediate complications  Procedure: US-guided intra-articular hip aspiration, left After discussion on risks/benefits/indications and informed verbal consent was obtained, a timeout was performed. Patient was lying supine on exam table. The hip was cleaned with chloraprep and alcohol swabs. Then utilizing ultrasound guidance, the patient's femoral head and neck junction was identified. The overlying soft tissue was anesthetized with 6 cc of lidocaine 1%.  Then using ultrasound guidance via an in-plane approach, an 18-gauge 3.5" needle was inserted into the femoral head and neck junction.  A few attempts at aspiration were performed without significant fluid yield, then we did flush the syringe with 2cc of normal saline and then returned about 5 cc of a serous, bloody fluid from the hip. The needle was removed and area covered with gauze and pressure applied. No bleeding after this, band-aid applied.  Patient tolerated procedure well without immediate complications.   IMPRESSION: US-guided hip aspiration yielding about 5 cc of serous, bloody fluid  Procedure, treatment alternatives, risks and benefits explained, specific risks discussed. Consent was given by the patient. Immediately prior to procedure a time out was called to  verify the correct patient, procedure, equipment, support staff and site/side marked as required. Patient was prepped and draped in the usual sterile fashion.     - US-guided hip aspiration today in [facility] patient -> 5cc of fluid yielded. There was not enough volume to send for Synovasure, but this fluid was sent for complete fluid analysis and culture - will notify Dr. Erlinda Hong once results return and patient will follow-up with him per his discretion  Elba Barman, DO [place]  This note was dictated using Dragon naturally speaking software and may contain errors in syntax, spelling, or content which have not been identified prior to signing this note.

## 2022-07-15 ENCOUNTER — Encounter: Payer: Self-pay | Admitting: Sports Medicine

## 2022-07-15 DIAGNOSIS — Z96642 Presence of left artificial hip joint: Secondary | ICD-10-CM

## 2022-07-15 DIAGNOSIS — M25552 Pain in left hip: Secondary | ICD-10-CM

## 2022-07-15 MED ORDER — LIDOCAINE HCL 1 % IJ SOLN
6.0000 mL | INTRAMUSCULAR | Status: AC | PRN
  Administered 2022-07-15: 6 mL

## 2022-07-20 LAB — ANAEROBIC AND AEROBIC CULTURE
AER RESULT:: NO GROWTH
MICRO NUMBER:: 14693362
MICRO NUMBER:: 14693363
SPECIMEN QUALITY:: ADEQUATE
SPECIMEN QUALITY:: ADEQUATE

## 2022-07-20 LAB — SYNOVIAL FLUID ANALYSIS, COMPLETE
Basophils, %: 0 %
Eosinophils-Synovial: 0 % (ref 0–2)
Lymphocytes-Synovial Fld: 41 % (ref 0–74)
Monocyte/Macrophage: 3 % (ref 0–69)
Neutrophil, Synovial: 54 % — ABNORMAL HIGH (ref 0–24)
Synoviocytes, %: 2 % (ref 0–15)
WBC, Synovial: 12740 cells/uL — ABNORMAL HIGH (ref <150)

## 2022-07-21 NOTE — Progress Notes (Signed)
Thanks for doing it.  I'll have him come back to the office.  Lauren, please schedule appt.  Thanks.

## 2022-08-05 ENCOUNTER — Ambulatory Visit (INDEPENDENT_AMBULATORY_CARE_PROVIDER_SITE_OTHER): Payer: Medicare Other | Admitting: Orthopaedic Surgery

## 2022-08-05 DIAGNOSIS — Z96642 Presence of left artificial hip joint: Secondary | ICD-10-CM

## 2022-08-05 NOTE — Progress Notes (Signed)
Office Visit Note   Patient: Marvin Chandler           Date of Birth: [birth year]           MRN: [id number] Visit Date: 08/05/2022              Requested by: [facility] [address] [facility] [place],  [place] [address] PCP: [facility]   Assessment & Plan: Visit Diagnoses:  1. Status post total replacement of left hip     Plan: Impression is a 72 year old gentleman with chronic left hip pain status post hip replacement over 20 years ago by a Careers adviser in [place].  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  We will make a referral to [facility] for him to see their total joint experts on prosthetic joint infections.  Follow-up with me as needed.  Follow-Up Instructions: No follow-ups on file.   Orders:  No orders of the defined types were placed in this encounter.  No orders of the defined types were placed in this encounter.     Procedures: No procedures performed   Clinical Data: No additional findings.   Subjective: Chief Complaint  Patient presents with   Left Hip - Follow-up    HPI  Returns today to follow-up for left hip pain.  He had an aspiration couple weeks ago by Dr. Shon Baton.  Review of Systems   Objective: Vital Signs: There were no vitals taken for this visit.  Physical Exam  Ortho Exam  Examination left hip is unchanged.  Specialty Comments:  No specialty comments available.  Imaging: No results found.   PMFS History: Patient Active Problem List   Diagnosis Date Noted   OSA (obstructive sleep apnea) 07/16/2020   Non-Obstructive CAD 07/31/2019   Demand ischemia 07/31/2019   Dyslipidemia 07/31/2019   Acute on chronic systolic CHF (congestive heart failure) 07/29/2019   Persistent atrial fibrillation 07/29/2019   Acute systolic CHF (congestive heart failure)  07/29/2019   Non-cardiac chest pain 11/08/2017   Asthma with acute exacerbation 10/06/2014   History of asthma 08/15/2014   Hemoptysis 08/15/2014   Chronic cough 08/15/2014   Cardiomyopathy 08/13/2014   Asthma exacerbation 07/16/2014   Pleuritic chest pain 07/16/2014   Asthma, chronic 02/19/2014   Personal history of colonic adenomas 02/20/2013   Dyspnea 12/16/2011   Chest pain with moderate risk for cardiac etiology 12/16/2011   HIATAL HERNIA 07/11/2006   OSTEOARTHRITIS 07/11/2006   ARTHRITIS, HIPS, BILATERAL 07/11/2006   Past Medical History:  Diagnosis Date   Arthritis of hip    bilateral   Asthma    CAD (coronary artery disease)    LHC 5/16:  Dx ostial 30%, LAD luminal irregs, EF 40%   Chronic atrial fibrillation ([facility]) 07/29/2019   Chronic systolic CHF (congestive heart failure) ([facility])    a. Echo 3/16:  inf and inf-lat HK, mild LVH, EF 45%, mild to mod LAE, normal RVF   Cough    Hiatal hernia    NICM (nonischemic cardiomyopathy) ([facility])    Osteoarthritis    Personal history of colonic adenomas 02/20/2013   Pneumonia     Family History  Problem Relation Age of Onset   Dementia Mother    Cancer Other    Stroke Cousin    Cancer Maternal Aunt    Diabetes Cousin    Colon cancer Neg Hx  Esophageal cancer Neg Hx    Heart attack Neg Hx    Hypertension Neg Hx     Past Surgical History:  Procedure Laterality Date   ATRIAL FIBRILLATION ABLATION N/A 02/16/2021   Procedure: ATRIAL FIBRILLATION ABLATION;  Surgeon: Regan Lemming, MD;  Location: [facility];  Service: Cardiovascular;  Laterality: N/A;   COLONOSCOPY  02/13/2013   FEMORAL ARTERY STENT Left 03/17/2022   LEFT AND RIGHT HEART CATHETERIZATION WITH CORONARY ANGIOGRAM N/A 08/18/2014   Procedure: LEFT AND RIGHT HEART CATHETERIZATION WITH CORONARY ANGIOGRAM;  Surgeon: Corky Crafts, MD;  Location: [facility] CATH LAB;  Service: Cardiovascular;  Laterality: N/A;   LEFT HEART CATH AND CORONARY ANGIOGRAPHY N/A  11/09/2017   Procedure: LEFT HEART CATH AND CORONARY ANGIOGRAPHY;  Surgeon: Runell Gess, MD;  Location: [facility];  Service: Cardiovascular;  Laterality: N/A;   POPLITEAL ARTERY STENT Right 04/07/2022   TOTAL HIP ARTHROPLASTY  05/02/2000   bilateral   Social History   Occupational History   Occupation: Lobbyist: [employer]  Tobacco Use   Smoking status: Former    Packs/day: 1.00    Years: 7.00    Additional pack years: 0.00    Total pack years: 7.00    Types: Cigarettes    Quit date: 11/05/1997    Years since quitting: 24.7   Smokeless tobacco: Never  Vaping Use   Vaping Use: Never used  Substance and Sexual Activity   Alcohol use: No    Alcohol/week: 0.0 standard drinks of alcohol   Drug use: No   Sexual activity: Not on file

## 2022-09-29 ENCOUNTER — Telehealth: Payer: Self-pay | Admitting: Orthopaedic Surgery

## 2022-09-29 NOTE — Telephone Encounter (Signed)
Patient came in stating Dr. Barrie Lyme does not have the referral and Auth for visit, auth needs to be sent to the [facility] in [place] Phone number is [phone]

## 2023-02-13 ENCOUNTER — Ambulatory Visit (INDEPENDENT_AMBULATORY_CARE_PROVIDER_SITE_OTHER): Payer: Medicare Other | Admitting: Sports Medicine

## 2023-02-13 ENCOUNTER — Telehealth: Payer: Self-pay | Admitting: Orthopaedic Surgery

## 2023-02-13 ENCOUNTER — Encounter: Payer: Self-pay | Admitting: Sports Medicine

## 2023-02-13 ENCOUNTER — Other Ambulatory Visit: Payer: Self-pay

## 2023-02-13 DIAGNOSIS — Z96642 Presence of left artificial hip joint: Secondary | ICD-10-CM | POA: Diagnosis not present

## 2023-02-13 DIAGNOSIS — S8012XA Contusion of left lower leg, initial encounter: Secondary | ICD-10-CM

## 2023-02-13 DIAGNOSIS — G8929 Other chronic pain: Secondary | ICD-10-CM | POA: Diagnosis not present

## 2023-02-13 DIAGNOSIS — M25552 Pain in left hip: Secondary | ICD-10-CM | POA: Diagnosis not present

## 2023-02-13 MED ORDER — LIDOCAINE HCL 1 % IJ SOLN
5.0000 mL | INTRAMUSCULAR | Status: AC | PRN
Start: 2023-02-13 — End: 2023-02-13
  Administered 2023-02-13: 5 mL

## 2023-02-13 NOTE — Telephone Encounter (Signed)
Pt came in office and signed Synovasure Comprehensive Infection Panel test Requisition. Put on [facility].

## 2023-02-13 NOTE — Progress Notes (Signed)
Marvin Chandler - 72 y.o. male MRN [id number]  Date of birth: April 07, 1951  Office Visit Note: Visit Date: 02/13/2023 PCP: [facility] Referred by: [facility]  Subjective: Chief Complaint  Patient presents with   Left Hip - Pain   HPI: Marvin Chandler is a pleasant 72 y.o. male who presents today for acute on chronic left hip pain, in setting of hip THA.  "Marvin Chandler" has been dealing with chronic left hip pain in the setting of a previous hip replacement.  Back in March of this year we did perform an ultrasound-guided hip aspiration which did not have any evidence of flexion.  His pain has continued but over the last few months he has had some swelling in the hip and femur.  Recently was evaluated in [place] at an orthopedic office and had an ultrasound which ruled out DVT but did show a fluid collection over the anterior hip and femur.  Was given some pain medication to take as needed.  Note reviewed from [facility] from 02/06/2023 in care everywhere.  There was concern about significant lysis about his Biomet metal THA. Per their notes, had a previous MRI that he came with the and he has a large adverse tissue reaction the cell count on her previous aspirate was only a 1000 cultures were negative.  At that visit he did have an ultrasound which ruled out DVT, negative.  They did place him on hydrocodone-acetaminophen 5-325 mg every 6 hours as needed.  Pertinent ROS were reviewed with the patient and found to be negative unless otherwise specified above in HPI.   Assessment & Plan: Visit Diagnoses:  1. Chronic left hip pain   2. Status post total replacement of left hip   3. Hematoma of left lower extremity, initial encounter    Plan: Marvin Chandler has a complex situation regarding his left hip pain and fluid collection.  He did have this hip replaced years ago and pain has bothered him for a few years, he has seen Dr. Roda Shutters here at the office as well as followed recently  by [facility] in [place].  He is contemplating hardware removal.  He did have swelling about the hip and proximal femur.  Previously had a negative duplex ultrasound of the lower extremity, ruling out DVT.  Through shared decision making, we did proceed with ultrasound-guided hip aspiration which yielded about 39cc of bloody aspirate.  This did not look infectious based on visualization, but we did send this off for Synovasure analysis.  I did let Marvin Chandler know, Dr. Roda Shutters will call him with these results to review and discuss next steps. He may continue his hydrocodone-acetaminophen 5-325 mg every 6 hours as needed.  I did want him to ice the hip for 15-20 minutes twice daily for the next 2 days for any postinjection pain.   Follow-up: Return for Dr. Roda Shutters will call with [facility] results.   Meds & Orders: No orders of the defined types were placed in this encounter.   Orders Placed This Encounter  Procedures   US Guided Needle Placement - No Linked Charges     Procedures: Large Joint Inj: L hip joint on 02/13/2023 11:18 AM Indications: pain, joint swelling and diagnostic evaluation Details: 18 G 3.5 in needle, ultrasound-guided anterior approach Medications: 5 mL lidocaine 1 % Aspirate: 39 mL bloody; sent for lab analysis Outcome: tolerated well, no immediate complications  Procedure: US-guided intra-articular hip aspiration, Left After discussion on risks/benefits/indications and informed verbal consent  was obtained, a timeout was performed. Patient was lying supine on exam table. The hip was cleaned with chloraprep and alcohol swabs. Then utilizing ultrasound guidance, the patient's hip arthroplasty and proximal femur was identified.  The area was scanned medially and laterally and a large hypoechoic fluid collection was identified. The overlying soft tissue was anesthetized with 5 cc of lidocaine 1%.  Then using ultrasound guidance via an in-plane approach, an 18-gauge 3.5" needle was inserted  into joint space and hypoechoic fluid collection, and approximately 39 cc of bloody fluid was aspirated. The needle was removed and area covered with gauze and pressure applied. No further bleeding after this, band-aid applied.  Patient tolerated procedure well without immediate complications.   IMPRESSION: Large bloody aspirate from hip aspiration  Procedure, treatment alternatives, risks and benefits explained, specific risks discussed. Consent was given by the patient. Immediately prior to procedure a time out was called to verify the correct patient, procedure, equipment, support staff and site/side marked as required. Patient was prepped and draped in the usual sterile fashion.            Clinical History: No specialty comments available.  He reports that he quit smoking about 25 years ago. His smoking use included cigarettes. He started smoking about 32 years ago. He has a 7 pack-year smoking history. He has never used smokeless tobacco. No results for input(s): "HGBA1C", "LABURIC" in the last 8760 hours.  Objective:    Physical Exam  Gen: Well-appearing, in no acute distress; non-toxic CV:  Well-perfused. Warm.  Resp: Breathing unlabored on room air; no wheezing. Psych: Fluid speech in conversation; appropriate affect; normal thought process Neuro: Sensation intact throughout. No gross coordination deficits.   Ortho Exam - Left hip: There is no bony TTP.  The soft tissues are tender to the touch with deep palpation over the anterior and lateral aspect of the hip and proximal femur.  There does appear to be an effusion and some soft tissue swelling over the anterior hip.  No redness or cellulitis of the skin.  There is pain with internal and external logroll and hyperflexion of the hip today.  Imaging:  BL Hip XR 10/29/19: Status post bilateral hip total replacement.  Lucency above the left  acetabular component.  The femoral stems appear to be well fixed with  pedestal effect    02/06/2023 6:05 PM EDT  AP pelvis and lateral of the hip show significant lysis totally different than the opposite side which is normal has a same implant in has lysis that goes below his lesser trochanter and some on the acetabular side as well so very concerned about either trend stenosis or the metal metal reaction with relatively normal cobalt and chromium levels or infection   Narrative & Impression  CLINICAL DATA:  Left hip pain   EXAM: MRI OF THE LEFT HIP WITHOUT AND WITH CONTRAST   TECHNIQUE: Multiplanar, multisequence MR imaging was performed both before and after administration of intravenous contrast.   CONTRAST:  9.38mL GADAVIST GADOBUTROL 1 MMOL/ML IV SOLN   COMPARISON:  Radiograph 10/29/2019   FINDINGS: Bones: Left total hip prosthesis in place with expected metal artifact. Erosive findings with underlying marrow edema along the anterior acetabular wall on images 21 through 29 of series 9 related to the underlying erosive arthropathy and synovitis.   The patient has a right total hip prosthesis as well.   Articular cartilage and labrum   Articular cartilage:  N/A   Labrum:  N/A  Joint or bursal effusion   Joint effusion: Very large left hip joint effusion with marked synovitis. There is anterior bulging of the synovium example on image 35 of series 9 and expanded capacity of the hip joint extending up along the gluteal musculature as well, volume of the hip joint about 750 cc.   Bursae: Trace left trochanteric bursitis.   Muscles and tendons   Muscles and tendons: Abnormal edema in the left iliopsoas muscle distally, and in the proximal left vastus lateralis and vastus intermedius muscles. This is particularly in proximity to the distended joint effusion.   Proximal hamstring tendinopathy, right greater than left.   Other findings   Miscellaneous:   No supplemental non-categorized findings.   IMPRESSION: 1. 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 The volume of the hip joint is about 750 cc. There is also abnormal edema in the left iliopsoas muscle and in the proximal left vastus lateralis and vastus intermedius muscles adjacent to the fusion. 2. Trace left trochanteric bursitis. 3. Proximal hamstring tendinopathy, right greater than left.     Electronically Signed   By: Gaylyn Rong M.D.   On: 05/16/2022 09:39    Previous imaging from 02/06/23:  OTHER FINDINGS: 7.8 x 1.9 x 7.2 cm mildly hyperechoic collection within the left upper leg overlying the left femur without significant internal color Doppler flow. Impression: 1. No DVT. 2. 7.8 cm collection within the left upper leg overlying the left femur, indeterminate on this ultrasound, possibly a hematoma. This can be further evaluated with cross-sectional imaging as clinically indicated.   Past Medical/Family/Surgical/Social History: Medications & Allergies reviewed per EMR, new medications updated. Patient Active Problem List   Diagnosis Date Noted   OSA (obstructive sleep apnea) 07/16/2020   Non-Obstructive CAD 07/31/2019   Demand ischemia (HCC) 07/31/2019   Dyslipidemia 07/31/2019   Acute on chronic systolic CHF (congestive heart failure) (HCC) 07/29/2019   Persistent atrial fibrillation (HCC) 07/29/2019   Acute systolic CHF (congestive heart failure) (HCC) 07/29/2019   Non-cardiac chest pain 11/08/2017   Asthma with acute exacerbation 10/06/2014   History of asthma 08/15/2014   Hemoptysis 08/15/2014   Chronic cough 08/15/2014   Cardiomyopathy (HCC) 08/13/2014   Asthma exacerbation 07/16/2014   Pleuritic chest pain 07/16/2014   Asthma, chronic 02/19/2014   History of colonic polyps 02/20/2013   Dyspnea 12/16/2011   Chest pain with moderate risk for cardiac etiology 12/16/2011   Diaphragmatic hernia 07/11/2006   Osteoarthritis 07/11/2006   Arthropathy of pelvic region  and thigh 07/11/2006   Past Medical History:  Diagnosis Date   Arthritis of hip    bilateral   Asthma    CAD (coronary artery disease)    LHC 5/16:  Dx ostial 30%, LAD luminal irregs, EF 40%   Chronic atrial fibrillation (HCC) 07/29/2019   Chronic systolic CHF (congestive heart failure) (HCC)    a. Echo 3/16:  inf and inf-lat HK, mild LVH, EF 45%, mild to mod LAE, normal RVF   Cough    Hiatal hernia    NICM (nonischemic cardiomyopathy) (HCC)    Osteoarthritis    Personal history of colonic adenomas 02/20/2013   Pneumonia    Family History  Problem Relation Age of Onset   Dementia Mother    Cancer Other    Stroke Cousin    Cancer Maternal Aunt    Diabetes Cousin    Colon cancer Neg Hx    Esophageal cancer  Neg Hx    Heart attack Neg Hx    Hypertension Neg Hx    Past Surgical History:  Procedure Laterality Date   ATRIAL FIBRILLATION ABLATION N/A 02/16/2021   Procedure: ATRIAL FIBRILLATION ABLATION;  Surgeon: Regan Lemming, MD;  Location: [facility] INVASIVE CV LAB;  Service: Cardiovascular;  Laterality: N/A;   COLONOSCOPY  02/13/2013   FEMORAL ARTERY STENT Left 03/17/2022   LEFT AND RIGHT HEART CATHETERIZATION WITH CORONARY ANGIOGRAM N/A 08/18/2014   Procedure: LEFT AND RIGHT HEART CATHETERIZATION WITH CORONARY ANGIOGRAM;  Surgeon: Corky Crafts, MD;  Location: [facility] CATH LAB;  Service: Cardiovascular;  Laterality: N/A;   LEFT HEART CATH AND CORONARY ANGIOGRAPHY N/A 11/09/2017   Procedure: LEFT HEART CATH AND CORONARY ANGIOGRAPHY;  Surgeon: Runell Gess, MD;  Location: [facility] INVAS[facility] CV LAB;  Service: Cardiovascular;  Laterality: N/A;   POPLITEAL ARTERY STENT Right 04/07/2022   TOTAL HIP ARTHROPLASTY  05/02/2000   bilateral   Social History   Occupational History   Occupation: Lobbyist: [employer]  Tobacco Use   Smoking status: Former    Current packs/day: 0.00    Average packs/day: 1 pack/day for 7.0 years (7.0 ttl  pk-yrs)    Types: Cigarettes    Start date: 11/06/1990    Quit date: 11/05/1997    Years since quitting: 25.2   Smokeless tobacco: Never  Vaping Use   Vaping status: Never Used  Substance and Sexual Activity   Alcohol use: No    Alcohol/week: 0.0 standard drinks of alcohol   Drug use: No   Sexual activity: Not on file

## 2023-02-15 ENCOUNTER — Telehealth: Payer: Self-pay

## 2023-02-15 NOTE — Telephone Encounter (Signed)
Faxed recent results from [facility] to [facility] in [place], [place] office. Fax #[phone]

## 2023-03-13 ENCOUNTER — Telehealth: Payer: Self-pay

## 2023-03-13 NOTE — Telephone Encounter (Signed)
Maisie Fus called stating that they have not received results from [facility].  Would like for results to be refaxed to [phone]. [facility] in [place], [place]'s office.  Stated that we were given the incorrect fax number.  Please advise.  Thank you.

## 2023-03-13 NOTE — Telephone Encounter (Signed)
Refaxed as of 2:45pm to number provided in message.

## 2023-06-08 NOTE — Progress Notes (Signed)
 HISTORY OF PRESENT ILLNESS: Patient is now s/p revision left total hip arthroplasty performed by Dr. Licia on December 4th, this was head liner exchange for metal on metal. Patient states they have been doing well.  This is his 2nd postoperative visit.  He completed physical therapy.  He is not taking any pain medication.  He ambulates with a cane for distance only.  He is back on his baseline Xarelto .  He is very pleased with his progress.  He had hip pain other than an intermittent ache laterally.  Of note, the patient also has the same metal on metal implant on the right.  On the left, the patient had severe pain but on the right he does not have any significant pain other than some mild lateral hip pain.  Prior to his revision on the left, cobalt and chromium levels were normal but an MRI showed significant metallosis.    PHYSICAL EXAM: Incision site is clean, dry, and intact. No pain with gentle hip range of motion. Neurovascularly intact distally including ability to fire tib ant and EHL. No obvious ipsilateral lower extremity swelling.  Patient has pain-free range of motion of the right hip.    PLAN: Patient is status post revision left total hip arthroplasty for metal on metal.  I have no concern with the left hip.  Incision looks good he had his ambulating well.  He has minimal pain.  I am concerned about potential for metallosis on the right side given how severe his left hip became.  I am going to order cobalt and chromium level on this gentleman.  I am also going to order a mars MRI of the right hip.  I will call the patient with the results.  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  The patient is in agreement.  We will touch base thereafter.  Disclaimer This note has been created with voice recognition software.  While this note has been edited for  accuracy, the software periodically misinterprets speech resulting in errors that might not have been caught in editing.  In the event you find an unusual error in this record, please notify us  at [phone] to resolve the same.

## 2023-11-06 ENCOUNTER — Telehealth (HOSPITAL_COMMUNITY): Payer: Self-pay

## 2023-11-06 NOTE — Telephone Encounter (Signed)
 Received referral from Dr. 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 Clinical review of pt follow up appt on 10/25/23 Pulmonary office note. Pt appropriate for scheduling for Pulmonary rehab. Will forward to support staff for scheduling and verification of insurance eligibility/benefits with pt consent.   [name], RN, BSN Cardiac and Pulmonary Rehab

## 2023-11-07 ENCOUNTER — Telehealth (HOSPITAL_COMMUNITY): Payer: Self-pay

## 2023-11-07 NOTE — Telephone Encounter (Signed)
 Called patient to see if he was interested in participating in the Pulmonary Rehab Program. Patient will come in for orientation on 7/11 and will attend the 10:15 exercise class.  Sent MyChart message.

## 2023-11-09 ENCOUNTER — Telehealth (HOSPITAL_COMMUNITY): Payer: Self-pay

## 2023-11-09 NOTE — Telephone Encounter (Signed)
 Called to confirm appt. Pt confirmed appt. Instructed pt on proper footwear. Gave directions along with department number.

## 2023-11-10 ENCOUNTER — Encounter (HOSPITAL_COMMUNITY)
Admission: RE | Admit: 2023-11-10 | Discharge: 2023-11-10 | Disposition: A | Discharge status: Home / Self Care | Source: Ambulatory Visit | Attending: Pulmonary Disease | Admitting: Pulmonary Disease

## 2023-11-10 ENCOUNTER — Encounter (HOSPITAL_COMMUNITY): Payer: Self-pay

## 2023-11-10 VITALS — BP 92/60 | HR 81 | Wt 217.2 lb

## 2023-11-10 DIAGNOSIS — J449 Chronic obstructive pulmonary disease, unspecified: Secondary | ICD-10-CM | POA: Diagnosis present

## 2023-11-10 LAB — GLUCOSE, CAPILLARY: Glucose-Capillary: 121 mg/dL — ABNORMAL HIGH (ref 70–99)

## 2023-11-10 NOTE — Progress Notes (Signed)
 Quoc A Roh 73 y.o. male  Initial Psychosocial Assessment  Pt psychosocial assessment reveals pt lives with their spouse. Pt is currently retired. Pt hobbies include watching sports. Pt reports his  stress level is low. Areas of stress/anxiety include health.  Pt does not exhibit signs of depression. Pt shows good  coping skills with positive outlook . Offered emotional support and reassurance. Monitor and evaluate progress toward psychosocial goal(s).  Goal(s): Help patient work toward returning to meaningful activities that improve patient's QOL and are attainable with patient's lung disease   11/10/2023 10:40 AM

## 2023-11-10 NOTE — Progress Notes (Signed)
 Marvin Chandler 73 y.o. male Pulmonary Rehab Orientation Note This patient who was referred to Pulmonary Rehab by Dr. Tor with the diagnosis of COPD 2 arrived today in Cardiac and Pulmonary Rehab. He arrived ambulatory with normal gait. He does not carry portable oxygen. Per patient, Marvin Chandler uses oxygen never. Color good, skin warm and dry. Patient is oriented to time and place. Patient's medical history, psychosocial health, and medications reviewed. Psychosocial assessment reveals patient lives with spouse. Marvin Chandler is currently retired. Patient hobbies include watching sports. Patient reports his stress level is low. Areas of stress/anxiety include health. Patient does not exhibit signs of depression. PHQ2/9 score 0/3. Trigg shows good  coping skills with positive outlook on life. Offered emotional support and reassurance. Will continue to monitor. Physical assessment performed by Nurse pick: Marvin Levin RN. Please see their orientation physical assessment note. Judge reports he  does take medications as prescribed. Patient states he  follows a regular  diet. The patient reports no specific efforts to gain or lose weight.. Patient's weight will be monitored closely. Demonstration and practice of PLB using pulse oximeter. Marvin Chandler able to return demonstration satisfactorily. Safety and hand hygiene in the exercise area reviewed with patient. Marvin Chandler voices understanding of the information reviewed. Department expectations discussed with patient and achievable goals were set. The patient shows enthusiasm about attending the program and we look forward to working with Marvin Chandler. [name] completed a 6 min walk test today and is scheduled to begin exercise on 11/21/23 @1000 .   [id number] Marvin Chandler, BSRT

## 2023-11-10 NOTE — Progress Notes (Signed)
 Pulmonary Individual Treatment Plan  Patient Details  Name: Marvin Chandler MRN: [id number] Date of Birth: 10/31/1950 Referring Provider:   [name] Pulmonary Rehab Walk Test from 11/10/2023 in [facility] for Heart, Vascular, & Lung Health  Referring Provider [name]    Initial Encounter Date:  Flowsheet Row Pulmonary Rehab Walk Test from 11/10/2023 in [facility] [address] for Heart, Vascular, & Lung Health  Date 11/10/23    Visit Diagnosis: Stage 2 moderate COPD by GOLD classification (HCC)  Patient's Home Medications on Admission:   Current Outpatient Medications:    albuterol  (PROVENTIL ) (2.5 MG/3ML) 0.083% nebulizer solution, Take 3 mLs (2.5 mg total) by nebulization every 6 (six) hours as needed for wheezing or shortness of breath., Disp: 300 mL, Rfl: 5   albuterol  (VENTOLIN  HFA) 108 (90 Base) MCG/ACT inhaler, Inhale 2 puffs into the lungs every 4 (four) hours as needed., Disp: 18 g, Rfl: 6   atorvastatin  (LIPITOR ) 80 MG tablet, Take 80 mg by mouth at bedtime., Disp: , Rfl:    empagliflozin (JARDIANCE) 25 MG TABS tablet, Take 12.5 mg by mouth daily. Take one half tablet daily, Disp: , Rfl:    EPINEPHrine  0.3 mg/0.3 mL IJ SOAJ injection, Inject 0.3 mLs (0.3 mg total) into the muscle once., Disp: 1 Device, Rfl: 11   fluticasone  (FLONASE ) 50 MCG/ACT nasal spray, Place 2 sprays into both nostrils daily., Disp: 16 g, Rfl: 5   fluticasone -salmeterol (ADVAIR) 250-50 MCG/ACT AEPB, Inhale 1 puff into the lungs daily., Disp: , Rfl:    furosemide  (LASIX ) 40 MG tablet, Take 40 mg by mouth 2 (two) times daily with a meal., Disp: , Rfl:    gabapentin (NEURONTIN) 300 MG capsule, Take 300 mg by mouth at bedtime., Disp: , Rfl:    isosorbide  mononitrate (IMDUR ) 30 MG 24 hr tablet, Take 1 tablet (30 mg total) by mouth daily., Disp: 30 tablet, Rfl: 2   loratadine  (CLARITIN ) 10 MG tablet, Take 10 mg by mouth daily., Disp: , Rfl:    metFORMIN (GLUCOPHAGE) 500  MG tablet, Take 500 mg by mouth daily with breakfast., Disp: , Rfl:    montelukast  (SINGULAIR ) 10 MG tablet, TAKE 1 TABLET BY MOUTH AT BEDTIME, Disp: 30 tablet, Rfl: 30   nitroGLYCERIN  (NITROSTAT ) 0.4 MG SL tablet, Place 1 tablet (0.4 mg total) under the tongue every 5 (five) minutes as needed., Disp: 25 tablet, Rfl: 3   omeprazole  (PRILOSEC) 20 MG capsule, Take 40 mg by mouth daily., Disp: , Rfl:    polyvinyl alcohol (LIQUIFILM TEARS) 1.4 % ophthalmic solution, Place 1 drop into both eyes 4 (four) times daily as needed for dry eyes., Disp: , Rfl:    rivaroxaban  (XARELTO ) 20 MG TABS tablet, Take 1 tablet (20 mg total) by mouth daily with supper., Disp: 30 tablet, Rfl: 6   sacubitril -valsartan  (ENTRESTO ) 97-103 MG, Take 1 tablet by mouth 2 (two) times daily., Disp: 60 tablet, Rfl: 11   spironolactone  (ALDACTONE ) 25 MG tablet, Take 0.5 tablets (12.5 mg total) by mouth daily., Disp: 15 tablet, Rfl: 2   terbinafine (LAMISIL) 1 % cream, Apply 1 application topically daily as needed (athelete's foot and nail fungus)., Disp: , Rfl:    terbinafine (LAMISIL) 250 MG tablet, Take 250 mg by mouth daily., Disp: , Rfl:   Past Medical History: Past Medical History:  Diagnosis Date   Arthritis of hip    bilateral   Asthma    CAD (coronary artery disease)    LHC 5/16:  Dx ostial 30%, LAD luminal irregs, EF 40%   Chronic atrial fibrillation (HCC) 07/29/2019   Chronic systolic CHF (congestive heart failure) (HCC)    a. Echo 3/16:  inf and inf-lat HK, mild LVH, EF 45%, mild to mod LAE, normal RVF   Cough    Hiatal hernia    NICM (nonischemic cardiomyopathy) (HCC)    Osteoarthritis    Personal history of colonic adenomas 02/20/2013   Pneumonia     Tobacco Use: Social History   Tobacco Use  Smoking Status Former   Current packs/day: 0.00   Average packs/day: 1 pack/day for 7.0 years (7.0 ttl pk-yrs)   Types: Cigarettes   Start date: 11/06/1990   Quit date: 11/05/1997   Years since quitting: 26.0   Smokeless Tobacco Never    Labs: Review Flowsheet  More data exists      Latest Ref Rng & Units 06/23/2016 09/12/2016 11/09/2017 06/14/2019 09/25/2020  Labs for ITP Cardiac and Pulmonary Rehab  Cholestrol 100 - 199 mg/dL 847  847  877  75  887   LDL (calc) 0 - 99 mg/dL 89  86  71  32  59   HDL-C >39 mg/dL 37  40  32  30  39   Trlycerides 0 - 149 mg/dL 869  867  96  52  63   Hemoglobin A1c 4.8 - 5.6 % - - 6.5  - -    Capillary Blood Glucose: Lab Results  Component Value Date   GLUCAP 121 (H) 11/10/2023   GLUCAP 147 (H) 02/16/2021   GLUCAP 121 (H) 02/16/2021    POCT Glucose     Row Name 11/10/23 0959             POCT Blood Glucose   Pre-Exercise 121 mg/dL          Pulmonary Assessment Scores:  Pulmonary Assessment Scores     Row Name 11/10/23 1032         ADL UCSD   ADL Phase Entry     SOB Score total 82       CAT Score   CAT Score 25       mMRC Score   mMRC Score 4       UCSD: Self-administered rating of dyspnea associated with activities of daily living (ADLs) 6-point scale (0 = not at all to 5 = maximal or unable to do because of breathlessness)  Scoring Scores range from 0 to 120.  Minimally important difference is 5 units  CAT: CAT can identify the health impairment of COPD patients and is better correlated with disease progression.  CAT has a scoring range of zero to 40. The CAT score is classified into four groups of low (less than 10), medium (10 - 20), high (21-30) and very high (31-40) based on the impact level of disease on health status. A CAT score over 10 suggests significant symptoms.  A worsening CAT score could be explained by an exacerbation, poor medication adherence, poor inhaler technique, or progression of COPD or comorbid conditions.  CAT MCID is 2 points  mMRC: mMRC (Modified Medical Research Council) Dyspnea Scale is used to assess the degree of baseline functional disability in patients of respiratory disease due to  dyspnea. No minimal important difference is established. A decrease in score of 1 point or greater is considered a positive change.   Pulmonary Function Assessment:  Pulmonary Function Assessment - 11/10/23 1003       Breath   Bilateral Breath  Sounds Clear    Shortness of Breath Fear of Shortness of Breath;Limiting activity          Exercise Target Goals: Exercise Program Goal: Individual exercise prescription set using results from initial 6 min walk test and THRR while considering  patient's activity barriers and safety.   Exercise Prescription Goal: Initial exercise prescription builds to 30-45 minutes a day of aerobic activity, 2-3 days per week.  Home exercise guidelines will be given to patient during program as part of exercise prescription that the participant will acknowledge.  Activity Barriers & Risk Stratification:  Activity Barriers & Cardiac Risk Stratification - 11/10/23 0959       Activity Barriers & Cardiac Risk Stratification   Activity Barriers Muscular Weakness;Shortness of Breath;Deconditioning;Arthritis;Left Hip Replacement;Right Hip Replacement    Cardiac Risk Stratification Moderate          6 Minute Walk:  6 Minute Walk     Row Name 11/10/23 1038         6 Minute Walk   Phase Initial     Distance 1037 feet     Walk Time 6 minutes     # of Rest Breaks 0     MPH 1.96     METS 1.97     RPE 11     Perceived Dyspnea  1     VO2 Peak 6.89     Symptoms No     Resting HR 81 bpm     Resting BP 92/60     Resting Oxygen Saturation  98 %     Exercise Oxygen Saturation  during 6 min walk 92 %     Max Ex. HR 100 bpm     Max Ex. BP 112/50     2 Minute Post BP 94/50       Interval HR   1 Minute HR 96     2 Minute HR 99     3 Minute HR 100     4 Minute HR 98     5 Minute HR 100     6 Minute HR 99     2 Minute Post HR 84     Interval Heart Rate? Yes       Interval Oxygen   Interval Oxygen? Yes     Baseline Oxygen Saturation % 98 %     1  Minute Oxygen Saturation % 96 %     1 Minute Liters of Oxygen 0 L     2 Minute Oxygen Saturation % 98 %     2 Minute Liters of Oxygen 0 L     3 Minute Oxygen Saturation % 94 %     3 Minute Liters of Oxygen 0 L     4 Minute Oxygen Saturation % 95 %     4 Minute Liters of Oxygen 0 L     5 Minute Oxygen Saturation % 92 %     5 Minute Liters of Oxygen 0 L     6 Minute Oxygen Saturation % 91 %     6 Minute Liters of Oxygen 0 L     2 Minute Post Oxygen Saturation % 100 %     2 Minute Post Liters of Oxygen 0 L        Oxygen Initial Assessment:  Oxygen Initial Assessment - 11/10/23 1000       Home Oxygen   Home Oxygen Device None    Sleep Oxygen Prescription CPAP   not compliant  Home Exercise Oxygen Prescription None    Home Resting Oxygen Prescription None      Initial 6 min Walk   Oxygen Used None      Program Oxygen Prescription   Program Oxygen Prescription None      Intervention   Short Term Goals To learn and understand importance of maintaining oxygen saturations>88%;To learn and demonstrate proper use of respiratory medications;To learn and understand importance of monitoring SPO2 with pulse oximeter and demonstrate accurate use of the pulse oximeter.;To learn and demonstrate proper pursed lip breathing techniques or other breathing techniques.     Long  Term Goals Maintenance of O2 saturations>88%;Compliance with respiratory medication;Verbalizes importance of monitoring SPO2 with pulse oximeter and return demonstration;Exhibits proper breathing techniques, such as pursed lip breathing or other method taught during program session;Demonstrates proper use of MDI's          Oxygen Re-Evaluation:   Oxygen Discharge (Final Oxygen Re-Evaluation):   Initial Exercise Prescription:  Initial Exercise Prescription - 11/10/23 1000       Date of Initial Exercise RX and Referring Provider   Date 11/10/23    Referring Provider [name]    Expected Discharge Date 02/08/24       Recumbant Bike   Level 1    RPM 25    Watts 40    Minutes 15    METs 2      NuStep   Level 1    SPM 70    Minutes 15    METs 2.5      Prescription Details   Frequency (times per week) 2    Duration Progress to 30 minutes of continuous aerobic without signs/symptoms of physical distress      Intensity   THRR 40-80% of Max Heartrate 58-117    Ratings of Perceived Exertion 11-13    Perceived Dyspnea 0-4      Progression   Progression Continue to progress workloads to maintain intensity without signs/symptoms of physical distress.      Resistance Training   Training Prescription Yes    Weight blue bands    Reps 10-15          Perform Capillary Blood Glucose checks as needed.  Exercise Prescription Changes:   Exercise Comments:   Exercise Goals and Review:   Exercise Goals     Row Name 11/10/23 1000             Exercise Goals   Increase Physical Activity Yes       Intervention Provide advice, education, support and counseling about physical activity/exercise needs.;Develop an individualized exercise prescription for aerobic and resistive training based on initial evaluation findings, risk stratification, comorbidities and participant's personal goals.       Expected Outcomes Short Term: Attend rehab on a regular basis to increase amount of physical activity.;Long Term: Exercising regularly at least 3-5 days a week.;Long Term: Add in home exercise to make exercise part of routine and to increase amount of physical activity.       Increase Strength and Stamina Yes       Intervention Provide advice, education, support and counseling about physical activity/exercise needs.;Develop an individualized exercise prescription for aerobic and resistive training based on initial evaluation findings, risk stratification, comorbidities and participant's personal goals.       Expected Outcomes Short Term: Increase workloads from initial exercise prescription for resistance,  speed, and METs.;Short Term: Perform resistance training exercises routinely during rehab and add in resistance training at home;Long Term: Improve  cardiorespiratory fitness, muscular endurance and strength as measured by increased METs and functional capacity ( )       Able to understand and use rate of perceived exertion (RPE) scale Yes       Intervention Provide education and explanation on how to use RPE scale       Expected Outcomes Short Term: Able to use RPE daily in rehab to express subjective intensity level;Long Term:  Able to use RPE to guide intensity level when exercising independently       Able to understand and use Dyspnea scale Yes       Intervention Provide education and explanation on how to use Dyspnea scale       Expected Outcomes Short Term: Able to use Dyspnea scale daily in rehab to express subjective sense of shortness of breath during exertion;Long Term: Able to use Dyspnea scale to guide intensity level when exercising independently       Knowledge and understanding of Target Heart Rate Range (THRR) Yes       Intervention Provide education and explanation of THRR including how the numbers were predicted and where they are located for reference       Expected Outcomes Short Term: Able to state/look up THRR;Long Term: Able to use THRR to govern intensity when exercising independently;Short Term: Able to use daily as guideline for intensity in rehab       Understanding of Exercise Prescription Yes       Intervention Provide education, explanation, and written materials on patient's individual exercise prescription       Expected Outcomes Short Term: Able to explain program exercise prescription;Long Term: Able to explain home exercise prescription to exercise independently          Exercise Goals Re-Evaluation :   Discharge Exercise Prescription (Final Exercise Prescription Changes):   Nutrition:  Target Goals: Understanding of nutrition guidelines, daily intake of  sodium 1500mg , cholesterol 200mg , calories 30% from fat and 7% or less from saturated fats, daily to have 5 or more servings of fruits and vegetables.  Biometrics:    Nutrition Therapy Plan and Nutrition Goals:   Nutrition Assessments:  MEDIFICTS Score Key: >=70 Need to make dietary changes  40-70 Heart Healthy Diet <= 40 Therapeutic Level Cholesterol Diet   Picture Your Plate Scores: <59 Unhealthy dietary pattern with much room for improvement. 41-50 Dietary pattern unlikely to meet recommendations for good health and room for improvement. 51-60 More healthful dietary pattern, with some room for improvement.  >60 Healthy dietary pattern, although there may be some specific behaviors that could be improved.    Nutrition Goals Re-Evaluation:   Nutrition Goals Discharge (Final Nutrition Goals Re-Evaluation):   Psychosocial: Target Goals: Acknowledge presence or absence of significant depression and/or stress, maximize coping skills, provide positive support system. Participant is able to verbalize types and ability to use techniques and skills needed for reducing stress and depression.  Initial Review & Psychosocial Screening:  Initial Psych Review & Screening - 11/10/23 0959       Initial Review   Current issues with History of Depression   PTSD     Family Dynamics   Good Support System? Yes      Barriers   Psychosocial barriers to participate in program There are no identifiable barriers or psychosocial needs.      Screening Interventions   Interventions Encouraged to exercise          Quality of Life Scores:  Scores of 19 and below usually  indicate a poorer quality of life in these areas.  A difference of  2-3 points is a clinically meaningful difference.  A difference of 2-3 points in the total score of the Quality of Life Index has been associated with significant improvement in overall quality of life, self-image, physical symptoms, and general health in  studies assessing change in quality of life.  PHQ-9: Review Flowsheet       11/10/2023 10/31/2014 02/19/2014 12/31/2013  Depression screen PHQ 2/9  Decreased Interest 0 0 0 0  Down, Depressed, Hopeless 0 0 0 0  PHQ - 2 Score 0 0 0 0  Altered sleeping 1 - - -  Tired, decreased energy 2 - - -  Change in appetite 0 - - -  Feeling bad or failure about yourself  0 - - -  Trouble concentrating 0 - - -  Moving slowly or fidgety/restless 0 - - -  Suicidal thoughts 0 - - -  PHQ-9 Score 3 - - -  Difficult doing work/chores Not difficult at all - - -   Interpretation of Total Score  Total Score Depression Severity:  1-4 = Minimal depression, 5-9 = Mild depression, 10-14 = Moderate depression, 15-19 = Moderately severe depression, 20-27 = Severe depression   Psychosocial Evaluation and Intervention:  Psychosocial Evaluation - 11/10/23 1026       Psychosocial Evaluation & Interventions   Interventions Encouraged to exercise with the program and follow exercise prescription    Comments Gladys states he has a history of PTSD and feels like it is starting to get worse. He has attended a class at the [facility] in the past for his PTSD. He states he will reach out to the [facility] to see if he can take the class again. Denies any needs or referrals from us  at this time.    Expected Outcomes For [name] to participate in PR free of any psychosocial barriers or concerns    Continue Psychosocial Services  No Follow up required          Psychosocial Re-Evaluation:   Psychosocial Discharge (Final Psychosocial Re-Evaluation):   Education: Education Goals: Education classes will be provided on a weekly basis, covering required topics. Participant will state understanding/return demonstration of topics presented.  Learning Barriers/Preferences:  Learning Barriers/Preferences - 11/10/23 1009       Learning Barriers/Preferences   Learning Barriers Sight;Reading;Exercise Concerns    Learning Preferences Group  Instruction;Individual Instruction;Skilled Demonstration          Education Topics: Know Your Numbers Group instruction that is supported by a PowerPoint presentation. Instructor discusses importance of knowing and understanding resting, exercise, and post-exercise oxygen saturation, heart rate, and blood pressure. Oxygen saturation, heart rate, blood pressure, rating of perceived exertion, and dyspnea are reviewed along with a normal range for these values.    Exercise for the Pulmonary Patient Group instruction that is supported by a PowerPoint presentation. Instructor discusses benefits of exercise, core components of exercise, frequency, duration, and intensity of an exercise routine, importance of utilizing pulse oximetry during exercise, safety while exercising, and options of places to exercise outside of rehab.    MET Level  Group instruction provided by PowerPoint, verbal discussion, and written material to support subject matter. Instructor reviews what METs are and how to increase METs.    Pulmonary Medications Verbally interactive group education provided by instructor with focus on inhaled medications and proper administration.   Anatomy and Physiology of the Respiratory System Group instruction provided by PowerPoint, verbal discussion,  and written material to support subject matter. Instructor reviews respiratory cycle and anatomical components of the respiratory system and their functions. Instructor also reviews differences in obstructive and restrictive respiratory diseases with examples of each.    Oxygen Safety Group instruction provided by PowerPoint, verbal discussion, and written material to support subject matter. There is an overview of "What is Oxygen" and "Why do we need it".  Instructor also reviews how to create a safe environment for oxygen use, the importance of using oxygen as prescribed, and the risks of noncompliance. There is a brief discussion on  traveling with oxygen and resources the patient may utilize.   Oxygen Use Group instruction provided by PowerPoint, verbal discussion, and written material to discuss how supplemental oxygen is prescribed and different types of oxygen supply systems. Resources for more information are provided.    Breathing Techniques Group instruction that is supported by demonstration and informational handouts. Instructor discusses the benefits of pursed lip and diaphragmatic breathing and detailed demonstration on how to perform both.     Risk Factor Reduction Group instruction that is supported by a PowerPoint presentation. Instructor discusses the definition of a risk factor, different risk factors for pulmonary disease, and how the heart and lungs work together.   Pulmonary Diseases Group instruction provided by PowerPoint, verbal discussion, and written material to support subject matter. Instructor gives an overview of the different type of pulmonary diseases. There is also a discussion on risk factors and symptoms as well as ways to manage the diseases.   Stress and Energy Conservation Group instruction provided by PowerPoint, verbal discussion, and written material to support subject matter. Instructor gives an overview of stress and the impact it can have on the body. Instructor also reviews ways to reduce stress. There is also a discussion on energy conservation and ways to conserve energy throughout the day.   Warning Signs and Symptoms Group instruction provided by PowerPoint, verbal discussion, and written material to support subject matter. Instructor reviews warning signs and symptoms of stroke, heart attack, cold and flu. Instructor also reviews ways to prevent the spread of infection.   Other Education Group or individual verbal, written, or video instructions that support the educational goals of the pulmonary rehab program.    Knowledge Questionnaire Score:  Knowledge  Questionnaire Score - 11/10/23 1031       Knowledge Questionnaire Score   Pre Score 13/18          Core Components/Risk Factors/Patient Goals at Admission:  Personal Goals and Risk Factors at Admission - 11/10/23 1007       Core Components/Risk Factors/Patient Goals on Admission    Weight Management Yes;Weight Maintenance    Intervention Weight Management: Develop a combined nutrition and exercise program designed to reach desired caloric intake, while maintaining appropriate intake of nutrient and fiber, sodium and fats, and appropriate energy expenditure required for the weight goal.;Weight Management: Provide education and appropriate resources to help participant work on and attain dietary goals.;Weight Management/Obesity: Establish reasonable short term and long term weight goals.    Admit Weight 217 lb 2.5 oz (98.5 kg)    Expected Outcomes Short Term: Continue to assess and modify interventions until short term weight is achieved;Weight Maintenance: Understanding of the daily nutrition guidelines, which includes 25-35% calories from fat, 7% or less cal from saturated fats, less than 200mg  cholesterol, less than 1.5gm of sodium, & 5 or more servings of fruits and vegetables daily;Long Term: Adherence to nutrition and physical activity/exercise program aimed toward  attainment of established weight goal;Understanding recommendations for meals to include 15-35% energy as protein, 25-35% energy from fat, 35-60% energy from carbohydrates, less than 200mg  of dietary cholesterol, 20-35 gm of total fiber daily;Understanding of distribution of calorie intake throughout the day with the consumption of 4-5 meals/snacks    Improve shortness of breath with ADL's Yes    Intervention Provide education, individualized exercise plan and daily activity instruction to help decrease symptoms of SOB with activities of daily living.    Expected Outcomes Short Term: Improve cardiorespiratory fitness to achieve a  reduction of symptoms when performing ADLs;Long Term: Be able to perform more ADLs without symptoms or delay the onset of symptoms          Core Components/Risk Factors/Patient Goals Review:    Core Components/Risk Factors/Patient Goals at Discharge (Final Review):    ITP Comments:   Comments: Dr. Slater Staff is Medical Director for Pulmonary Rehab at [facility].

## 2023-11-10 NOTE — Progress Notes (Addendum)
 Pulmonary Rehab Orientation Physical Assessment Note    Well appearing, A&Ox4, NAD Eyes/Ears: +glasses Lungs: Clear with no wheezes, rales, rhonchi, + chronic cough with sputum, +dyspnea on exertion Heart: + irregular rhythm, no murmurs, no rubs, no clicks Gastrointestinal: abdomin soft, + bowel sounds in all 4 quads, denies recent weight gain or loss, endorses normal BM, +nausea and/or vomiting with steroid med. Advised to take with food Genitourinary: + frequency, + nocturia 3x nightly Extremities:  +2 pulses, grip strength equal, strong, no edema, no cyanosis, no clubbing Integumentary: pt denies any rashes, open or non healing wounds Psy/Soc: Pt states PTSD getting worse. Stated he had therapy previously, wants to get back in with a program. Stated he will reach out to the [facility]. Declined any resources or referrals through [facility].  Assistive devices: + Glucometer   Pt unaware he has been diagnosed with HF. Education provided, daily weight log provided. Pt understands without assistance.

## 2023-11-21 ENCOUNTER — Encounter (HOSPITAL_COMMUNITY)
Admission: RE | Admit: 2023-11-21 | Discharge: 2023-11-21 | Discharge status: Home / Self Care | Source: Ambulatory Visit | Attending: Pulmonary Disease

## 2023-11-21 VITALS — Wt 220.9 lb

## 2023-11-21 DIAGNOSIS — J449 Chronic obstructive pulmonary disease, unspecified: Secondary | ICD-10-CM | POA: Diagnosis not present

## 2023-11-21 LAB — GLUCOSE, CAPILLARY
Glucose-Capillary: 165 mg/dL — ABNORMAL HIGH (ref 70–99)
Glucose-Capillary: 96 mg/dL (ref 70–99)

## 2023-11-21 NOTE — Progress Notes (Signed)
 Daily Session Note  Patient Details  Name: Marvin Chandler MRN: [id number] Date of Birth: [birth year] Referring Provider:   [name] Pulmonary Rehab Walk Test from 11/10/2023 in [facility] for Heart, Vascular, & Lung Health  Referring Provider [name]    Encounter Date: 11/21/2023  Check In:  Session Check In - 11/21/23 1057       Check-In   Supervising physician immediately available to respond to emergencies [facility] MD immediately available    Physician(s) Rosaline Skains, NP    Location [facility]    Staff Present [name], RN, BSN;Nasirah Sachs Claudene, Neita Moats, MS, ACSM-CEP, Exercise Physiologist;Samantha Belarus, RD, LDN    Virtual Visit No    Medication changes reported     No    Fall or balance concerns reported    No    Tobacco Cessation No Change    Warm-up and Cool-down Performed as group-led instruction    Resistance Training Performed Yes    VAD Patient? No    PAD/SET Patient? No      Pain Assessment   Currently in Pain? No/denies    Multiple Pain Sites No          Capillary Blood Glucose: Results for orders placed or performed during the hospital encounter of 11/10/23 (from the past 24 hours)  Glucose, capillary     Status: Abnormal   Collection Time: 11/21/23 10:23 AM  Result Value Ref Range   Glucose-Capillary 165 (H) 70 - 99 mg/dL  Glucose, capillary     Status: None   Collection Time: 11/21/23 11:34 AM  Result Value Ref Range   Glucose-Capillary 96 70 - 99 mg/dL     Exercise Prescription Changes - 11/21/23 1100       Response to Exercise   Blood Pressure (Admit) 98/58    Blood Pressure (Exercise) 96/56    Blood Pressure (Exit) 98/70    Heart Rate (Admit) 91 bpm    Heart Rate (Exercise) 83 bpm    Heart Rate (Exit) 86 bpm    Oxygen Saturation (Admit) 96 %    Oxygen Saturation (Exercise) 98 %    Oxygen Saturation (Exit) 97 %    Rating of Perceived Exertion (Exercise) 13    Perceived Dyspnea  (Exercise) 3    Duration Progress to 30 minutes of  aerobic without signs/symptoms of physical distress    Intensity THRR unchanged      Progression   Progression Continue to progress workloads to maintain intensity without signs/symptoms of physical distress.      Resistance Training   Training Prescription Yes    Weight blue bands    Reps 10-15    Time 10 Minutes      Recumbant Bike   Level 1    Minutes 15    METs 1.9      NuStep   Level 1    SPM 82    Minutes 15    METs 1.6          Social History   Tobacco Use  Smoking Status Former   Current packs/day: 0.00   Average packs/day: 1 pack/day for 7.0 years (7.0 ttl pk-yrs)   Types: Cigarettes   Start date: 11/06/1990   Quit date: 11/05/1997   Years since quitting: 26.0  Smokeless Tobacco Never    Goals Met:  Proper associated with RPD/PD & O2 Sat Independence with exercise equipment Exercise tolerated well No report of concerns or symptoms today Strength training  completed today  Goals Unmet:  Not Applicable  Comments: Service time is from 1018 to 1134.    Dr. Slater Staff is Medical Director for Pulmonary Rehab at [facility].

## 2023-11-23 ENCOUNTER — Encounter (HOSPITAL_COMMUNITY)
Admission: RE | Admit: 2023-11-23 | Discharge: 2023-11-23 | Disposition: A | Discharge status: Home / Self Care | Source: Ambulatory Visit | Attending: Pulmonary Disease | Admitting: Pulmonary Disease

## 2023-11-23 DIAGNOSIS — J449 Chronic obstructive pulmonary disease, unspecified: Secondary | ICD-10-CM

## 2023-11-23 LAB — GLUCOSE, CAPILLARY
Glucose-Capillary: 152 mg/dL — ABNORMAL HIGH (ref 70–99)
Glucose-Capillary: 83 mg/dL (ref 70–99)

## 2023-11-23 NOTE — Progress Notes (Signed)
 Daily Session Note  Patient Details  Name: Marvin Chandler MRN: [id number] Date of Birth: [birth year] Referring Provider:   [name] Pulmonary Rehab Walk Test from 11/10/2023 in [facility] for Heart, Vascular, & Lung Health  Referring Provider [name]    Encounter Date: 11/23/2023  Check In:  Session Check In - 11/23/23 1019       Check-In   Supervising physician immediately available to respond to emergencies [facility] MD immediately available    Physician(s) Lum Louis, NP    [facility]    Staff Present [name], RN, BSN;[place] Carmack Claudene Neita Moats, MS, ACSM-CEP, Exercise Physiologist    Virtual Visit No    Medication changes reported     No    Fall or balance concerns reported    No    Tobacco Cessation No Change    Warm-up and Cool-down Performed as group-led instruction    Resistance Training Performed Yes    VAD Patient? No    PAD/SET Patient? No      Pain Assessment   Currently in Pain? No/denies    Multiple Pain Sites No          Capillary Blood Glucose: No results found for this or any previous visit (from the past 24 hours).    Social History   Tobacco Use  Smoking Status Former   Current packs/day: 0.00   Average packs/day: 1 pack/day for 7.0 years (7.0 ttl pk-yrs)   Types: Cigarettes   Start date: 11/06/1990   Quit date: 11/05/1997   Years since quitting: 26.0  Smokeless Tobacco Never    Goals Met:  Proper associated with RPD/PD & O2 Sat Independence with exercise equipment Exercise tolerated well No report of concerns or symptoms today Strength training completed today  Goals Unmet:  Not Applicable  Comments: Service time is from 1019 to 1128.    Dr. Slater Staff is Medical Director for Pulmonary Rehab at [facility].

## 2023-11-28 ENCOUNTER — Encounter (HOSPITAL_COMMUNITY)
Admission: RE | Admit: 2023-11-28 | Discharge: 2023-11-28 | Disposition: A | Discharge status: Home / Self Care | Source: Ambulatory Visit | Attending: Pulmonary Disease | Admitting: Pulmonary Disease

## 2023-11-28 DIAGNOSIS — J449 Chronic obstructive pulmonary disease, unspecified: Secondary | ICD-10-CM

## 2023-11-29 NOTE — Progress Notes (Signed)
 Daily Session Note  Patient Details  Name: Marvin Chandler MRN: [id number] Date of Birth: 1951/03/01 Referring Provider:   [name] Pulmonary Rehab Walk Test from 11/10/2023 in [facility] for Heart, Vascular, & Lung Health  Referring Provider [name]    Encounter Date: 11/28/2023  Check In:  Session Check In - 11/29/23 9077       Check-In   Supervising physician immediately available to respond to emergencies [facility] MD immediately available    Physician(s) Barnie Press, NP    [facility]    Staff Present Johnnie Moats, MS, ACSM-CEP, Exercise Physiologist;Gissela Bloch Claudene Evert Byes, RN, MHA;Mary Bastin, RN, BSN    Virtual Visit No    Medication changes reported     No    Fall or balance concerns reported    No    Tobacco Cessation No Change    Warm-up and Cool-down Performed as group-led instruction    Resistance Training Performed Yes    VAD Patient? No    PAD/SET Patient? No      Pain Assessment   Currently in Pain? No/denies    Multiple Pain Sites No          Capillary Blood Glucose: No results found for this or any previous visit (from the past 24 hours).    Social History   Tobacco Use  Smoking Status Former   Current packs/day: 0.00   Average packs/day: 1 pack/day for 7.0 years (7.0 ttl pk-yrs)   Types: Cigarettes   Start date: 11/06/1990   Quit date: 11/05/1997   Years since quitting: 26.0  Smokeless Tobacco Never    Goals Met:  Proper associated with RPD/PD & O2 Sat Independence with exercise equipment Exercise tolerated well No report of concerns or symptoms today Strength training completed today  Goals Unmet:  Not Applicable  Comments: Service time is from 1013 to 1133.    Dr. Slater Staff is Medical Director for Pulmonary Rehab at [facility].

## 2023-11-30 ENCOUNTER — Encounter (HOSPITAL_COMMUNITY)
Admission: RE | Admit: 2023-11-30 | Discharge: 2023-11-30 | Disposition: A | Discharge status: Home / Self Care | Source: Ambulatory Visit | Attending: Pulmonary Disease | Admitting: Pulmonary Disease

## 2023-11-30 DIAGNOSIS — J449 Chronic obstructive pulmonary disease, unspecified: Secondary | ICD-10-CM

## 2023-11-30 NOTE — Progress Notes (Signed)
 Daily Session Note  Patient Details  Name: Marvin Chandler MRN: [id number] Date of Birth: July 14, 1950 Referring Provider:   [name] Pulmonary Rehab Walk Test from 11/10/2023 in [facility], Vascular, & Lung Health  Referring Provider [name]    Encounter Date: 11/30/2023  Check In:  Session Check In - 11/30/23 1022       Check-In   Supervising physician immediately available to respond to emergencies [facility] MD immediately available    Physician(s) Josefa Beauvais, NP    [facility]    Staff Present Johnnie Moats, MS, ACSM-CEP, Exercise Physiologist;Casey Claudene Candia Levin, RN, BSN;[place] Merry BS, ACSM-CEP, Exercise Physiologist    Virtual Visit No    Medication changes reported     No    Fall or balance concerns reported    No    Tobacco Cessation No Change    Warm-up and Cool-down Performed as group-led instruction    Resistance Training Performed Yes    VAD Patient? No    PAD/SET Patient? No      Pain Assessment   Currently in Pain? No/denies    Multiple Pain Sites No          Capillary Blood Glucose: No results found for this or any previous visit (from the past 24 hours).    Social History   Tobacco Use  Smoking Status Former   Current packs/day: 0.00   Average packs/day: 1 pack/day for 7.0 years (7.0 ttl pk-yrs)   Types: Cigarettes   Start date: 11/06/1990   Quit date: 11/05/1997   Years since quitting: 26.0  Smokeless Tobacco Never    Goals Met:  Exercise tolerated well No report of concerns or symptoms today Strength training completed today  Goals Unmet:  Not Applicable  Comments: Service time is from 1007 to 1140.    Dr. Slater Staff is Medical Director for Pulmonary Rehab at [facility].

## 2023-12-05 ENCOUNTER — Encounter (HOSPITAL_COMMUNITY)
Admission: RE | Admit: 2023-12-05 | Discharge: 2023-12-05 | Disposition: A | Discharge status: Home / Self Care | Source: Ambulatory Visit | Attending: Pulmonary Disease | Admitting: Pulmonary Disease

## 2023-12-05 VITALS — Wt 221.6 lb

## 2023-12-05 DIAGNOSIS — J449 Chronic obstructive pulmonary disease, unspecified: Secondary | ICD-10-CM | POA: Insufficient documentation

## 2023-12-05 NOTE — Progress Notes (Signed)
 Daily Session Note  Patient Details  Name: Marvin Chandler MRN: [id number] Date of Birth: 1950/05/16 Referring Provider:   [name] Pulmonary Rehab Walk Test from 11/10/2023 in [facility] for Heart, Vascular, & Lung Health  Referring Provider [name]    Encounter Date: 12/05/2023  Check In:  Session Check In - 12/05/23 1127       Check-In   Supervising physician immediately available to respond to emergencies [facility] MD immediately available    Physician(s) Josefa Beauvais, NP    [facility]    Staff Present Johnnie Moats, MS, ACSM-CEP, Exercise Physiologist;Casey Claudene Candia Levin, RN, BSN;Randi Reeve BS, ACSM-CEP, Exercise Physiologist    Virtual Visit No    Medication changes reported     No    Fall or balance concerns reported    No    Tobacco Cessation No Change    Warm-up and Cool-down Performed as group-led instruction    Resistance Training Performed Yes    VAD Patient? No    PAD/SET Patient? No      Pain Assessment   Currently in Pain? No/denies    Multiple Pain Sites No          Capillary Blood Glucose: No results found for this or any previous visit (from the past 24 hours).   Exercise Prescription Changes - 12/05/23 1100       Response to Exercise   Blood Pressure (Admit) 108/60    Blood Pressure (Exercise) 114/60    Blood Pressure (Exit) 112/60    Heart Rate (Admit) 87 bpm    Heart Rate (Exercise) 100 bpm    Heart Rate (Exit) 98 bpm    Oxygen Saturation (Admit) 98 %    Oxygen Saturation (Exercise) 96 %    Oxygen Saturation (Exit) 98 %    Rating of Perceived Exertion (Exercise) 15    Perceived Dyspnea (Exercise) 1    Duration Progress to 30 minutes of  aerobic without signs/symptoms of physical distress    Intensity THRR unchanged      Progression   Progression Continue to progress workloads to maintain intensity without signs/symptoms of physical distress.      Resistance Training   Training  Prescription Yes    Weight blue bands    Reps 10-15    Time 10 Minutes      Recumbant Bike   Level 3    RPM 101    Watts 67    Minutes 15    METs 2.9      NuStep   Level 2    SPM 67    Minutes 15    METs 2.9          Social History   Tobacco Use  Smoking Status Former   Current packs/day: 0.00   Average packs/day: 1 pack/day for 7.0 years (7.0 ttl pk-yrs)   Types: Cigarettes   Start date: 11/06/1990   Quit date: 11/05/1997   Years since quitting: 26.0  Smokeless Tobacco Never    Goals Met:  Exercise tolerated well Queuing for purse lip breathing No report of concerns or symptoms today Strength training completed today  Goals Unmet:  Not Applicable  Comments: Service time is from 1015 to 1148    Dr. Slater Staff is Medical Director for Pulmonary Rehab at [facility].

## 2023-12-06 NOTE — Progress Notes (Signed)
 Pulmonary Individual Treatment Plan  Patient Details  Name: Marvin Chandler MRN: [id number] Date of Birth: 12-25-1950 Referring Provider:   [name] Pulmonary Rehab Walk Test from 11/10/2023 in [facility]  Referring Provider [name]    Initial Encounter Date:  Flowsheet Row Pulmonary Rehab Walk Test from 11/10/2023 in [facility]  Date 11/10/23    Visit Diagnosis: Stage 2 moderate COPD by GOLD classification (HCC)  Patient's Home Medications on Admission:   Current Outpatient Medications:    albuterol  (PROVENTIL ) (2.5 MG/3ML) 0.083% nebulizer solution, Take 3 mLs (2.5 mg total) by nebulization every 6 (six) hours as needed for wheezing or shortness of breath., Disp: 300 mL, Rfl: 5   albuterol  (VENTOLIN  HFA) 108 (90 Base) MCG/ACT inhaler, Inhale 2 puffs into the lungs every 4 (four) hours as needed., Disp: 18 g, Rfl: 6   atorvastatin  (LIPITOR ) 80 MG tablet, Take 80 mg by mouth at bedtime., Disp: , Rfl:    empagliflozin (JARDIANCE) 25 MG TABS tablet, Take 12.5 mg by mouth daily. Take one half tablet daily, Disp: , Rfl:    EPINEPHrine  0.3 mg/0.3 mL IJ SOAJ injection, Inject 0.3 mLs (0.3 mg total) into the muscle once., Disp: 1 Device, Rfl: 11   fluticasone  (FLONASE ) 50 MCG/ACT nasal spray, Place 2 sprays into both nostrils daily., Disp: 16 g, Rfl: 5   fluticasone -salmeterol (ADVAIR) 250-50 MCG/ACT AEPB, Inhale 1 puff into the lungs daily., Disp: , Rfl:    furosemide  (LASIX ) 40 MG tablet, Take 40 mg by mouth 2 (two) times daily with a meal., Disp: , Rfl:    gabapentin (NEURONTIN) 300 MG capsule, Take 300 mg by mouth at bedtime., Disp: , Rfl:    isosorbide  mononitrate (IMDUR ) 30 MG 24 hr tablet, Take 1 tablet (30 mg total) by mouth daily., Disp: 30 tablet, Rfl: 2   loratadine  (CLARITIN ) 10 MG tablet, Take 10 mg by mouth daily., Disp: , Rfl:    metFORMIN (GLUCOPHAGE) 500  MG tablet, Take 500 mg by mouth daily with breakfast., Disp: , Rfl:    montelukast  (SINGULAIR ) 10 MG tablet, TAKE 1 TABLET BY MOUTH AT BEDTIME, Disp: 30 tablet, Rfl: 30   nitroGLYCERIN  (NITROSTAT ) 0.4 MG SL tablet, Place 1 tablet (0.4 mg total) under the tongue every 5 (five) minutes as needed., Disp: 25 tablet, Rfl: 3   omeprazole  (PRILOSEC) 20 MG capsule, Take 40 mg by mouth daily., Disp: , Rfl:    polyvinyl alcohol (LIQUIFILM TEARS) 1.4 % ophthalmic solution, Place 1 drop into both eyes 4 (four) times daily as needed for dry eyes., Disp: , Rfl:    rivaroxaban  (XARELTO ) 20 MG TABS tablet, Take 1 tablet (20 mg total) by mouth daily with supper., Disp: 30 tablet, Rfl: 6   sacubitril -valsartan  (ENTRESTO ) 97-103 MG, Take 1 tablet by mouth 2 (two) times daily., Disp: 60 tablet, Rfl: 11   spironolactone  (ALDACTONE ) 25 MG tablet, Take 0.5 tablets (12.5 mg total) by mouth daily., Disp: 15 tablet, Rfl: 2   terbinafine (LAMISIL) 1 % cream, Apply 1 application topically daily as needed (athelete's foot and nail fungus)., Disp: , Rfl:    terbinafine (LAMISIL) 250 MG tablet, Take 250 mg by mouth daily., Disp: , Rfl:   Past Medical History: Past Medical History:  Diagnosis Date   Arthritis of hip    bilateral   Asthma    CAD (coronary artery disease)    LHC 5/16:  Dx ostial 30%, LAD luminal irregs, EF 40%   Chronic atrial fibrillation (HCC) 07/29/2019   Chronic systolic CHF (congestive heart failure) (HCC)    a. Echo 3/16:  inf and inf-lat HK, mild LVH, EF 45%, mild to mod LAE, normal RVF   Cough    Hiatal hernia    NICM (nonischemic cardiomyopathy) (HCC)    Osteoarthritis    Personal history of colonic adenomas 02/20/2013   Pneumonia     Tobacco Use: Social History   Tobacco Use  Smoking Status Former   Current packs/day: 0.00   Average packs/day: 1 pack/day for 7.0 years (7.0 ttl pk-yrs)   Types: Cigarettes   Start date: 11/06/1990   Quit date: 11/05/1997   Years since quitting: 26.1   Smokeless Tobacco Never    Labs: Review Flowsheet  More data exists      Latest Ref Rng & Units 06/23/2016 09/12/2016 11/09/2017 06/14/2019 09/25/2020  Labs for ITP Cardiac and Pulmonary Rehab  Cholestrol 100 - 199 mg/dL 847  847  877  75  887   LDL (calc) 0 - 99 mg/dL 89  86  71  32  59   HDL-C >39 mg/dL 37  40  32  30  39   Trlycerides 0 - 149 mg/dL 869  867  96  52  63   Hemoglobin A1c 4.8 - 5.6 % - - 6.5  - -    Capillary Blood Glucose: Lab Results  Component Value Date   GLUCAP 83 11/23/2023   GLUCAP 152 (H) 11/23/2023   GLUCAP 96 11/21/2023   GLUCAP 165 (H) 11/21/2023   GLUCAP 121 (H) 11/10/2023    POCT Glucose     Row Name 11/10/23 0959             POCT Blood Glucose   Pre-Exercise 121 mg/dL          Pulmonary Assessment Scores:  Pulmonary Assessment Scores     Row Name 11/10/23 1032         ADL UCSD   ADL Phase Entry     SOB Score total 82       CAT Score   CAT Score 25       mMRC Score   mMRC Score 4       UCSD: Self-administered rating of dyspnea associated with activities of daily living (ADLs) 6-point scale (0 = not at all to 5 = maximal or unable to do because of breathlessness)  Scoring Scores range from 0 to 120.  Minimally important difference is 5 units  CAT: CAT can identify the health impairment of COPD patients and is better correlated with disease progression.  CAT has a scoring range of zero to 40. The CAT score is classified into four groups of low (less than 10), medium (10 - 20), high (21-30) and very high (31-40) based on the impact level of disease on health status. A CAT score over 10 suggests significant symptoms.  A worsening CAT score could be explained by an exacerbation, poor medication adherence, poor inhaler technique, or progression of COPD or comorbid conditions.  CAT MCID is 2 points  mMRC: mMRC (Modified Medical Research Council) Dyspnea Scale is used to assess the degree of baseline functional disability in  patients of respiratory disease due to dyspnea. No minimal important difference is established. A decrease in score of 1 point or greater is considered a positive change.   Pulmonary Function Assessment:  Pulmonary Function Assessment - 11/10/23 1003  Breath   Bilateral Breath Sounds Clear    Shortness of Breath Fear of Shortness of Breath;Limiting activity          Exercise Target Goals: Exercise Program Goal: Individual exercise prescription set using results from initial 6 min walk test and THRR while considering  patient's activity barriers and safety.   Exercise Prescription Goal: Initial exercise prescription builds to 30-45 minutes a day of aerobic activity, 2-3 days per week.  Home exercise guidelines will be given to patient during program as part of exercise prescription that the participant will acknowledge.  Activity Barriers & Risk Stratification:  Activity Barriers & Cardiac Risk Stratification - 11/10/23 0959       Activity Barriers & Cardiac Risk Stratification   Activity Barriers Muscular Weakness;Shortness of Breath;Deconditioning;Arthritis;Left Hip Replacement;Right Hip Replacement    Cardiac Risk Stratification Moderate          6 Minute Walk:  6 Minute Walk     Row Name 11/10/23 1038         6 Minute Walk   Phase Initial     Distance 1037 feet     Walk Time 6 minutes     # of Rest Breaks 0     MPH 1.96     METS 1.97     RPE 11     Perceived Dyspnea  1     VO2 Peak 6.89     Symptoms No     Resting HR 81 bpm     Resting BP 92/60     Resting Oxygen Saturation  98 %     Exercise Oxygen Saturation  during 6 min walk 92 %     Max Ex. HR 100 bpm     Max Ex. BP 112/50     2 Minute Post BP 94/50       Interval HR   1 Minute HR 96     2 Minute HR 99     3 Minute HR 100     4 Minute HR 98     5 Minute HR 100     6 Minute HR 99     2 Minute Post HR 84     Interval Heart Rate? Yes       Interval Oxygen   Interval Oxygen? Yes      Baseline Oxygen Saturation % 98 %     1 Minute Oxygen Saturation % 96 %     1 Minute Liters of Oxygen 0 L     2 Minute Oxygen Saturation % 98 %     2 Minute Liters of Oxygen 0 L     3 Minute Oxygen Saturation % 94 %     3 Minute Liters of Oxygen 0 L     4 Minute Oxygen Saturation % 95 %     4 Minute Liters of Oxygen 0 L     5 Minute Oxygen Saturation % 92 %     5 Minute Liters of Oxygen 0 L     6 Minute Oxygen Saturation % 91 %     6 Minute Liters of Oxygen 0 L     2 Minute Post Oxygen Saturation % 100 %     2 Minute Post Liters of Oxygen 0 L        Oxygen Initial Assessment:  Oxygen Initial Assessment - 11/10/23 1000       Home Oxygen   Home Oxygen Device None    Sleep Oxygen Prescription  CPAP   not compliant   Home Exercise Oxygen Prescription None    Home Resting Oxygen Prescription None      Initial 6 min Walk   Oxygen Used None      Program Oxygen Prescription   Program Oxygen Prescription None      Intervention   Short Term Goals To learn and understand importance of maintaining oxygen saturations>88%;To learn and demonstrate proper use of respiratory medications;To learn and understand importance of monitoring SPO2 with pulse oximeter and demonstrate accurate use of the pulse oximeter.;To learn and demonstrate proper pursed lip breathing techniques or other breathing techniques.     Long  Term Goals Maintenance of O2 saturations>88%;Compliance with respiratory medication;Verbalizes importance of monitoring SPO2 with pulse oximeter and return demonstration;Exhibits proper breathing techniques, such as pursed lip breathing or other method taught during program session;Demonstrates proper use of MDI's          Oxygen Re-Evaluation:  Oxygen Re-Evaluation     Row Name 11/29/23 0841             Program Oxygen Prescription   Program Oxygen Prescription None         Home Oxygen   Home Oxygen Device None       Sleep Oxygen Prescription CPAP  not compliant        Home Exercise Oxygen Prescription None       Home Resting Oxygen Prescription None         Goals/Expected Outcomes   Short Term Goals To learn and understand importance of maintaining oxygen saturations>88%;To learn and demonstrate proper use of respiratory medications;To learn and understand importance of monitoring SPO2 with pulse oximeter and demonstrate accurate use of the pulse oximeter.;To learn and demonstrate proper pursed lip breathing techniques or other breathing techniques.        Long  Term Goals Maintenance of O2 saturations>88%;Compliance with respiratory medication;Verbalizes importance of monitoring SPO2 with pulse oximeter and return demonstration;Exhibits proper breathing techniques, such as pursed lip breathing or other method taught during program session;Demonstrates proper use of MDI's       Goals/Expected Outcomes Compliance and understanding of oxygen saturation monitoring and breathing techniques to decrease shortness of breath.          Oxygen Discharge (Final Oxygen Re-Evaluation):  Oxygen Re-Evaluation - 11/29/23 0841       Program Oxygen Prescription   Program Oxygen Prescription None      Home Oxygen   Home Oxygen Device None    Sleep Oxygen Prescription CPAP   not compliant   Home Exercise Oxygen Prescription None    Home Resting Oxygen Prescription None      Goals/Expected Outcomes   Short Term Goals To learn and understand importance of maintaining oxygen saturations>88%;To learn and demonstrate proper use of respiratory medications;To learn and understand importance of monitoring SPO2 with pulse oximeter and demonstrate accurate use of the pulse oximeter.;To learn and demonstrate proper pursed lip breathing techniques or other breathing techniques.     Long  Term Goals Maintenance of O2 saturations>88%;Compliance with respiratory medication;Verbalizes importance of monitoring SPO2 with pulse oximeter and return demonstration;Exhibits proper breathing  techniques, such as pursed lip breathing or other method taught during program session;Demonstrates proper use of MDI's    Goals/Expected Outcomes Compliance and understanding of oxygen saturation monitoring and breathing techniques to decrease shortness of breath.          Initial Exercise Prescription:  Initial Exercise Prescription - 11/10/23 1000  Date of Initial Exercise RX and Referring Provider   Date 11/10/23    Referring Provider [name]    Expected Discharge Date 02/08/24      Recumbant Bike   Level 1    RPM 25    Watts 40    Minutes 15    METs 2      NuStep   Level 1    SPM 70    Minutes 15    METs 2.5      Prescription Details   Frequency (times per week) 2    Duration Progress to 30 minutes of continuous aerobic without signs/symptoms of physical distress      Intensity   THRR 40-80% of Max Heartrate 58-117    Ratings of Perceived Exertion 11-13    Perceived Dyspnea 0-4      Progression   Progression Continue to progress workloads to maintain intensity without signs/symptoms of physical distress.      Resistance Training   Training Prescription Yes    Weight blue bands    Reps 10-15          Perform Capillary Blood Glucose checks as needed.  Exercise Prescription Changes:   Exercise Prescription Changes     Row Name 11/21/23 1100 12/05/23 1100           Response to Exercise   Blood Pressure (Admit) 98/58 108/60      Blood Pressure (Exercise) 96/56 114/60      Blood Pressure (Exit) 98/70 112/60      Heart Rate (Admit) 91 bpm 87 bpm      Heart Rate (Exercise) 83 bpm 100 bpm      Heart Rate (Exit) 86 bpm 98 bpm      Oxygen Saturation (Admit) 96 % 98 %      Oxygen Saturation (Exercise) 98 % 96 %      Oxygen Saturation (Exit) 97 % 98 %      Rating of Perceived Exertion (Exercise) 13 15      Perceived Dyspnea (Exercise) 3 1      Duration Progress to 30 minutes of  aerobic without signs/symptoms of physical distress Progress to 30  minutes of  aerobic without signs/symptoms of physical distress      Intensity THRR unchanged THRR unchanged        Progression   Progression Continue to progress workloads to maintain intensity without signs/symptoms of physical distress. Continue to progress workloads to maintain intensity without signs/symptoms of physical distress.        Resistance Training   Training Prescription Yes Yes      Weight blue bands blue bands      Reps 10-15 10-15      Time 10 Minutes 10 Minutes        Recumbant Bike   Level 1 3      RPM -- 101      Watts -- 67      Minutes 15 15      METs 1.9 2.9        NuStep   Level 1 2      SPM 82 67      Minutes 15 15      METs 1.6 2.9         Exercise Comments:   Exercise Goals and Review:   Exercise Goals     Row Name 11/10/23 1000             Exercise Goals   Increase Physical  Activity Yes       Intervention Provide advice, education, support and counseling about physical activity/exercise needs.;Develop an individualized exercise prescription for aerobic and resistive training based on initial evaluation findings, risk stratification, comorbidities and participant's personal goals.       Expected Outcomes Short Term: Attend rehab on a regular basis to increase amount of physical activity.;Long Term: Exercising regularly at least 3-5 days a week.;Long Term: Add in home exercise to make exercise part of routine and to increase amount of physical activity.       Increase Strength and Stamina Yes       Intervention Provide advice, education, support and counseling about physical activity/exercise needs.;Develop an individualized exercise prescription for aerobic and resistive training based on initial evaluation findings, risk stratification, comorbidities and participant's personal goals.       Expected Outcomes Short Term: Increase workloads from initial exercise prescription for resistance, speed, and METs.;Short Term: Perform resistance  training exercises routinely during rehab and add in resistance training at home;Long Term: Improve cardiorespiratory fitness, muscular endurance and strength as measured by increased METs and functional capacity ( )       Able to understand and use rate of perceived exertion (RPE) scale Yes       Intervention Provide education and explanation on how to use RPE scale       Expected Outcomes Short Term: Able to use RPE daily in rehab to express subjective intensity level;Long Term:  Able to use RPE to guide intensity level when exercising independently       Able to understand and use Dyspnea scale Yes       Intervention Provide education and explanation on how to use Dyspnea scale       Expected Outcomes Short Term: Able to use Dyspnea scale daily in rehab to express subjective sense of shortness of breath during exertion;Long Term: Able to use Dyspnea scale to guide intensity level when exercising independently       Knowledge and understanding of Target Heart Rate Range (THRR) Yes       Intervention Provide education and explanation of THRR including how the numbers were predicted and where they are located for reference       Expected Outcomes Short Term: Able to state/look up THRR;Long Term: Able to use THRR to govern intensity when exercising independently;Short Term: Able to use daily as guideline for intensity in rehab       Understanding of Exercise Prescription Yes       Intervention Provide education, explanation, and written materials on patient's individual exercise prescription       Expected Outcomes Short Term: Able to explain program exercise prescription;Long Term: Able to explain home exercise prescription to exercise independently          Exercise Goals Re-Evaluation :  Exercise Goals Re-Evaluation     Row Name 11/29/23 [phone]             Exercise Goal Re-Evaluation   Exercise Goals Review Increase Physical Activity;Able to understand and use Dyspnea scale;Understanding  of Exercise Prescription;Increase Strength and Stamina;Knowledge and understanding of Target Heart Rate Range (THRR);Able to understand and use rate of perceived exertion (RPE) scale       Comments Marvin Chandler has completed 3 exercise sessions. He exercises for 15 min on the Nustep and recumbent bike. He averages 2.3 METs at level 2 on the Nustep and 2.7 METs at level 2 on the recumbent bike. He performs the warmup and cooldown standing without limitations.  It is too soon to notate any discernable progressions at this time. Will continue to monitor and progress as able.       Expected Outcomes Through exercise at rehab and home, the patient will decrease shortness of breath with daily activities and feel confident in carrying out an exercise regimen at home.          Discharge Exercise Prescription (Final Exercise Prescription Changes):  Exercise Prescription Changes - 12/05/23 1100       Response to Exercise   Blood Pressure (Admit) 108/60    Blood Pressure (Exercise) 114/60    Blood Pressure (Exit) 112/60    Heart Rate (Admit) 87 bpm    Heart Rate (Exercise) 100 bpm    Heart Rate (Exit) 98 bpm    Oxygen Saturation (Admit) 98 %    Oxygen Saturation (Exercise) 96 %    Oxygen Saturation (Exit) 98 %    Rating of Perceived Exertion (Exercise) 15    Perceived Dyspnea (Exercise) 1    Duration Progress to 30 minutes of  aerobic without signs/symptoms of physical distress    Intensity THRR unchanged      Progression   Progression Continue to progress workloads to maintain intensity without signs/symptoms of physical distress.      Resistance Training   Training Prescription Yes    Weight blue bands    Reps 10-15    Time 10 Minutes      Recumbant Bike   Level 3    RPM 101    Watts 67    Minutes 15    METs 2.9      NuStep   Level 2    SPM 67    Minutes 15    METs 2.9          Nutrition:  Target Goals: Understanding of nutrition guidelines, daily intake of sodium 1500mg ,  cholesterol 200mg , calories 30% from fat and 7% or less from saturated fats, daily to have 5 or more servings of fruits and vegetables.  Biometrics:    Nutrition Therapy Plan and Nutrition Goals:  Nutrition Therapy & Goals - 11/21/23 1106       Nutrition Therapy   Diet Heart Healthy Diet    Drug/Food Interactions Statins/Certain Fruits      Personal Nutrition Goals   Nutrition Goal Patient to improve diet quality by using the plate method as a guide for meal planning to include lean protein/plant protein, fruits, vegetables, whole grains, nonfat dairy as part of a well-balanced diet.    Comments Marvin Chandler has medical history of DM2(metformin, jardiance), PAF, HTN, cardiomyopathy, CHF(lasix , entresto ), OSA, COPD2. He reports eating 1-2 meals per day and multiple snacks. He often eats out with his wife but is mindful of sodium intake and vegetable intake. He weighs ~3x/week and checks blood sugar a few times per week as well. LDL is well controlled. A1c is slightly above goal. Patient will benefit from participation in pulmonary rehab for nutrition education, exercise, and lifestyle modification.      Intervention Plan   Intervention Prescribe, educate and counsel regarding individualized specific dietary modifications aiming towards targeted core components such as weight, hypertension, lipid management, diabetes, heart failure and other comorbidities.;Nutrition handout(s) given to patient.    Expected Outcomes Short Term Goal: Understand basic principles of dietary content, such as calories, fat, sodium, cholesterol and nutrients.;Long Term Goal: Adherence to prescribed nutrition plan.          Nutrition Assessments:  MEDIFICTS Score Key: >=70 Need to  make dietary changes  40-70 Heart Healthy Diet <= 40 Therapeutic Level Cholesterol Diet   Picture Your Plate Scores: <59 Unhealthy dietary pattern with much room for improvement. 41-50 Dietary pattern unlikely to meet recommendations  for good health and room for improvement. 51-60 More healthful dietary pattern, with some room for improvement.  >60 Healthy dietary pattern, although there may be some specific behaviors that could be improved.    Nutrition Goals Re-Evaluation:  Nutrition Goals Re-Evaluation     Row Name 11/21/23 1106             Goals   Current Weight 220 lb 14.4 oz (100.2 kg)       Comment A1c 7.1, LDL 61, HD l33       Expected Outcome Marvin Chandler has medical history of DM2(metformin, jardiance), PAF, HTN, cardiomyopathy, CHF(lasix , entresto ), OSA, COPD2. He reports eating 1-2 meals per day and multiple snacks. He often eats out with his wife but is mindful of sodium intake and vegetable intake. He weighs ~3x/week and checks blood sugar a few times per week as well. Will continue to monitor weight while enrolled in pulmonary rehab. LDL is well controlled. A1c is slightly above goal. Patient will benefit from participation in pulmonary rehab for nutrition education, exercise, and lifestyle modification.          Nutrition Goals Discharge (Final Nutrition Goals Re-Evaluation):  Nutrition Goals Re-Evaluation - 11/21/23 1106       Goals   Current Weight 220 lb 14.4 oz (100.2 kg)    Comment A1c 7.1, LDL 61, HD l33    Expected Outcome Marvin Chandler has medical history of DM2(metformin, jardiance), PAF, HTN, cardiomyopathy, CHF(lasix , entresto ), OSA, COPD2. He reports eating 1-2 meals per day and multiple snacks. He often eats out with his wife but is mindful of sodium intake and vegetable intake. He weighs ~3x/week and checks blood sugar a few times per week as well. Will continue to monitor weight while enrolled in pulmonary rehab. LDL is well controlled. A1c is slightly above goal. Patient will benefit from participation in pulmonary rehab for nutrition education, exercise, and lifestyle modification.          Psychosocial: Target Goals: Acknowledge presence or absence of significant depression and/or stress,  maximize coping skills, provide positive support system. Participant is able to verbalize types and ability to use techniques and skills needed for reducing stress and depression.  Initial Review & Psychosocial Screening:  Initial Psych Review & Screening - 11/10/23 0959       Initial Review   Current issues with History of Depression   PTSD     Family Dynamics   Good Support System? Yes      Barriers   Psychosocial barriers to participate in program There are no identifiable barriers or psychosocial needs.      Screening Interventions   Interventions Encouraged to exercise          Quality of Life Scores:  Scores of 19 and below usually indicate a poorer quality of life in these areas.  A difference of  2-3 points is a clinically meaningful difference.  A difference of 2-3 points in the total score of the Quality of Life Index has been associated with significant improvement in overall quality of life, self-image, physical symptoms, and general health in studies assessing change in quality of life.  PHQ-9: Review Flowsheet       11/10/2023 10/31/2014 02/19/2014 12/31/2013  Depression screen PHQ 2/9  Decreased Interest 0 0 0 0  Down, Depressed, Hopeless 0 0 0 0  PHQ - 2 Score 0 0 0 0  Altered sleeping 1 - - -  Tired, decreased energy 2 - - -  Change in appetite 0 - - -  Feeling bad or failure about yourself  0 - - -  Trouble concentrating 0 - - -  Moving slowly or fidgety/restless 0 - - -  Suicidal thoughts 0 - - -  PHQ-9 Score 3 - - -  Difficult doing work/chores Not difficult at all - - -   Interpretation of Total Score  Total Score Depression Severity:  1-4 = Minimal depression, 5-9 = Mild depression, 10-14 = Moderate depression, 15-19 = Moderately severe depression, 20-27 = Severe depression   Psychosocial Evaluation and Intervention:  Psychosocial Evaluation - 11/10/23 1026       Psychosocial Evaluation & Interventions   Interventions Encouraged to exercise with  the program and follow exercise prescription    Comments Marvin Chandler states he has a history of PTSD and feels like it is starting to get worse. He has attended a class at the [facility] in the past for his PTSD. He states he will reach out to the [facility] to see if he can take the class again. Denies any needs or referrals from us  at this time.    Expected Outcomes For Marvin Chandler to participate in PR free of any psychosocial barriers or concerns    Continue Psychosocial Services  No Follow up required          Psychosocial Re-Evaluation:  Psychosocial Re-Evaluation     Row Name 11/29/23 [phone]             Psychosocial Re-Evaluation   Current issues with History of Depression       Comments Marvin Chandler continues to deny any new psychosocial barriers or concerns at this time.       Expected Outcomes For Marvin Chandler to participate in PR free of any psychosocial barriers or concerns       Interventions Encouraged to attend Pulmonary Rehabilitation for the exercise       Continue Psychosocial Services  No Follow up required          Psychosocial Discharge (Final Psychosocial Re-Evaluation):  Psychosocial Re-Evaluation - 11/29/23 0924       Psychosocial Re-Evaluation   Current issues with History of Depression    Comments Marvin Chandler continues to deny any new psychosocial barriers or concerns at this time.    Expected Outcomes For Marvin Chandler to participate in PR free of any psychosocial barriers or concerns    Interventions Encouraged to attend Pulmonary Rehabilitation for the exercise    Continue Psychosocial Services  No Follow up required          Education: Education Goals: Education classes will be provided on a weekly basis, covering required topics. Participant will state understanding/return demonstration of topics presented.  Learning Barriers/Preferences:  Learning Barriers/Preferences - 11/10/23 1009       Learning Barriers/Preferences   Learning Barriers Sight;Reading;Exercise Concerns    Learning  Preferences Group Instruction;Individual Instruction;Skilled Demonstration          Education Topics: Know Your Numbers Group instruction that is supported by a PowerPoint presentation. Instructor discusses importance of knowing and understanding resting, exercise, and post-exercise oxygen saturation, heart rate, and blood pressure. Oxygen saturation, heart rate, blood pressure, rating of perceived exertion, and dyspnea are reviewed along with a normal range for these values.    Exercise for the Pulmonary Patient Group instruction that  is supported by a PowerPoint presentation. Instructor discusses benefits of exercise, core components of exercise, frequency, duration, and intensity of an exercise routine, importance of utilizing pulse oximetry during exercise, safety while exercising, and options of places to exercise outside of rehab.    MET Level  Group instruction provided by PowerPoint, verbal discussion, and written material to support subject matter. Instructor reviews what METs are and how to increase METs.    Pulmonary Medications Verbally interactive group education provided by instructor with focus on inhaled medications and proper administration.   Anatomy and Physiology of the Respiratory System Group instruction provided by PowerPoint, verbal discussion, and written material to support subject matter. Instructor reviews respiratory cycle and anatomical components of the respiratory system and their functions. Instructor also reviews differences in obstructive and restrictive respiratory diseases with examples of each.    Oxygen Safety Group instruction provided by PowerPoint, verbal discussion, and written material to support subject matter. There is an overview of "What is Oxygen" and "Why do we need it".  Instructor also reviews how to create a safe environment for oxygen use, the importance of using oxygen as prescribed, and the risks of noncompliance. There is a brief  discussion on traveling with oxygen and resources the patient may utilize.   Oxygen Use Group instruction provided by PowerPoint, verbal discussion, and written material to discuss how supplemental oxygen is prescribed and different types of oxygen supply systems. Resources for more information are provided.    Breathing Techniques Group instruction that is supported by demonstration and informational handouts. Instructor discusses the benefits of pursed lip and diaphragmatic breathing and detailed demonstration on how to perform both.     Risk Factor Reduction Group instruction that is supported by a PowerPoint presentation. Instructor discusses the definition of a risk factor, different risk factors for pulmonary disease, and how the heart and lungs work together.   Pulmonary Diseases Group instruction provided by PowerPoint, verbal discussion, and written material to support subject matter. Instructor gives an overview of the different type of pulmonary diseases. There is also a discussion on risk factors and symptoms as well as ways to manage the diseases.   Stress and Energy Conservation Group instruction provided by PowerPoint, verbal discussion, and written material to support subject matter. Instructor gives an overview of stress and the impact it can have on the body. Instructor also reviews ways to reduce stress. There is also a discussion on energy conservation and ways to conserve energy throughout the day. Flowsheet Row PULMONARY REHAB CHRONIC OBSTRUCTIVE PULMONARY DISEASE from 11/23/2023 in [facility], V[facility], & [facility] Health  Date 11/23/23  Educator RN  Instruction Review Code 1- Verbalizes Understanding    Warning Signs and Symptoms Group instruction provided by PowerPoint, verbal discussion, and written material to support subject matter. Instructor reviews warning signs and symptoms of stroke, heart attack, cold and flu. Instructor also  reviews ways to prevent the spread of infection. Flowsheet Row PULMONARY REHAB CHRONIC OBSTRUCTIVE PULMONARY DISEASE from 11/30/2023 in [facility] for Heart, Vascular, & Lung Health  Date 11/30/23  Educator RN  Instruction Review Code 1- Verbalizes Understanding    Other Education Group or individual verbal, written, or video instructions that support the educational goals of the pulmonary rehab program.    Knowledge Questionnaire Score:  Knowledge Questionnaire Score - 11/10/23 1031       Knowledge Questionnaire Score   Pre Score 13/18  Core Components/Risk Factors/Patient Goals at Admission:  Personal Goals and Risk Factors at Admission - 11/10/23 1007       Core Components/Risk Factors/Patient Goals on Admission    Weight Management Yes;Weight Maintenance    Intervention Weight Management: Develop a combined nutrition and exercise program designed to reach desired caloric intake, while maintaining appropriate intake of nutrient and fiber, sodium and fats, and appropriate energy expenditure required for the weight goal.;Weight Management: Provide education and appropriate resources to help participant work on and attain dietary goals.;Weight Management/Obesity: Establish reasonable short term and long term weight goals.    Admit Weight 217 lb 2.5 oz (98.5 kg)    Expected Outcomes Short Term: Continue to assess and modify interventions until short term weight is achieved;Weight Maintenance: Understanding of the daily nutrition guidelines, which includes 25-35% calories from fat, 7% or less cal from saturated fats, less than 200mg  cholesterol, less than 1.5gm of sodium, & 5 or more servings of fruits and vegetables daily;Long Term: Adherence to nutrition and physical activity/exercise program aimed toward attainment of established weight goal;Understanding recommendations for meals to include 15-35% energy as protein, 25-35% energy from fat, 35-60% energy  from carbohydrates, less than 200mg  of dietary cholesterol, 20-35 gm of total fiber daily;Understanding of distribution of calorie intake throughout the day with the consumption of 4-5 meals/snacks    Improve shortness of breath with ADL's Yes    Intervention Provide education, individualized exercise plan and daily activity instruction to help decrease symptoms of SOB with activities of daily living.    Expected Outcomes Short Term: Improve cardiorespiratory fitness to achieve a reduction of symptoms when performing ADLs;Long Term: Be able to perform more ADLs without symptoms or delay the onset of symptoms          Core Components/Risk Factors/Patient Goals Review:   Goals and Risk Factor Review     Row Name 11/29/23 0927             Core Components/Risk Factors/Patient Goals Review   Personal Goals Review Weight Management/Obesity;Improve shortness of breath with ADL's;Develop more efficient breathing techniques such as purse lipped breathing and diaphragmatic breathing and practicing self-pacing with activity.       Review Monthly review of patient's Core Components/Risk Factors/Patient Goals are as follows: Goal progressing for maintaining weight. Goal progressing for improving shortness of breath with ADL's. Marvin Chandler is currently exercising on RA to maintain sats >88%. Goal progressing for developing more efficient breathing techniques such as purse lipped breathing and diaphragmatic breathing; and practicing self-pacing with activity. We will continue to monitor Marvin Chandler's progress throughout the program.       Expected Outcomes To improve shortness of breath with ADL's, develop more efficient breathing techniques such as purse lipped breathing and diaphragmatic breathing; and practicing self-pacing with activity and maintain weight.          Core Components/Risk Factors/Patient Goals at Discharge (Final Review):   Goals and Risk Factor Review - 11/29/23 0927       Core  Components/Risk Factors/Patient Goals Review   Personal Goals Review Weight Management/Obesity;Improve shortness of breath with ADL's;Develop more efficient breathing techniques such as purse lipped breathing and diaphragmatic breathing and practicing self-pacing with activity.    Review Monthly review of patient's Core Components/Risk Factors/Patient Goals are as follows: Goal progressing for maintaining weight. Goal progressing for improving shortness of breath with ADL's. Marvin Chandler is currently exercising on RA to maintain sats >88%. Goal progressing for developing more efficient breathing techniques such as purse lipped breathing  and diaphragmatic breathing; and practicing self-pacing with activity. We will continue to monitor Marvin Chandler's progress throughout the program.    Expected Outcomes To improve shortness of breath with ADL's, develop more efficient breathing techniques such as purse lipped breathing and diaphragmatic breathing; and practicing self-pacing with activity and maintain weight.          ITP Comments:Pt is making expected progress toward Pulmonary Rehab goals after completing 5 session(s). Recommend continued exercise, life style modification, education, and utilization of breathing techniques to increase stamina and strength, while also decreasing shortness of breath with exertion.  Dr. Slater Staff is Medical Director for Pulmonary Rehab at [facility].

## 2023-12-07 ENCOUNTER — Encounter (HOSPITAL_COMMUNITY)
Admission: RE | Admit: 2023-12-07 | Discharge: 2023-12-07 | Disposition: A | Discharge status: Home / Self Care | Source: Ambulatory Visit | Attending: Pulmonary Disease | Admitting: Pulmonary Disease

## 2023-12-07 DIAGNOSIS — J449 Chronic obstructive pulmonary disease, unspecified: Secondary | ICD-10-CM | POA: Diagnosis not present

## 2023-12-07 NOTE — Progress Notes (Signed)
 Daily Session Note  Patient Details  Name: Marvin Chandler MRN: [id number] Date of Birth: 1951/02/18 Referring Provider:   [name] Pulmonary Rehab Walk Test from 11/10/2023 in [facility] for Heart, Vascular, & Lung Health  Referring Provider [name]    Encounter Date: 12/07/2023  Check In:  Session Check In - 12/07/23 1030       Check-In   Supervising physician immediately available to respond to emergencies [facility] MD immediately available    Physician(s) Orren Fabry, PA    Location [facility]    Staff Present Johnnie Moats, MS, ACSM-CEP, Exercise Physiologist;Dugan Vanhoesen Claudene Candia Levin, RN, BSN;Randi Reeve BS, ACSM-CEP, Exercise Physiologist;Samantha Belarus, RD, LDN    Virtual Visit No    Medication changes reported     No    Fall or balance concerns reported    No    Tobacco Cessation No Change    Warm-up and Cool-down Performed as group-led instruction    Resistance Training Performed Yes    VAD Patient? No    PAD/SET Patient? No      Pain Assessment   Currently in Pain? No/denies          Capillary Blood Glucose: No results found for this or any previous visit (from the past 24 hours).    Social History   Tobacco Use  Smoking Status Former   Current packs/day: 0.00   Average packs/day: 1 pack/day for 7.0 years (7.0 ttl pk-yrs)   Types: Cigarettes   Start date: 11/06/1990   Quit date: 11/05/1997   Years since quitting: 26.1  Smokeless Tobacco Never    Goals Met:  Proper associated with RPD/PD & O2 Sat Independence with exercise equipment Exercise tolerated well No report of concerns or symptoms today Strength training completed today  Goals Unmet:  Not Applicable  Comments: Service time is from 1022 to 1145.    Dr. Slater Staff is Medical Director for Pulmonary Rehab at [facility].

## 2023-12-12 ENCOUNTER — Encounter (HOSPITAL_COMMUNITY)
Admission: RE | Admit: 2023-12-12 | Discharge: 2023-12-12 | Disposition: A | Discharge status: Home / Self Care | Source: Ambulatory Visit | Attending: Pulmonary Disease

## 2023-12-12 NOTE — Progress Notes (Signed)
 Marvin Chandler arrived in Pulmonary Rehab. Pt stated he felt tired and did not feel like he could exercise. Marvin Chandler was sent home.

## 2023-12-14 ENCOUNTER — Encounter (HOSPITAL_COMMUNITY)
Admission: RE | Admit: 2023-12-14 | Discharge: 2023-12-14 | Disposition: A | Discharge status: Home / Self Care | Source: Ambulatory Visit | Attending: Pulmonary Disease | Admitting: Pulmonary Disease

## 2023-12-14 DIAGNOSIS — J449 Chronic obstructive pulmonary disease, unspecified: Secondary | ICD-10-CM

## 2023-12-14 NOTE — Progress Notes (Signed)
 Daily Session Note  Patient Details  Name: Marvin Chandler MRN: [id number] Date of Birth: 1950/08/06 Referring Provider:   [name] Pulmonary Rehab Walk Test from 11/10/2023 in [facility] for Heart, Vascular, & Lung Health  Referring Provider [name]    Encounter Date: 12/14/2023  Check In:  Session Check In - 12/14/23 1109       Check-In   Supervising physician immediately available to respond to emergencies [facility] MD immediately available    Physician(s) Damien Braver, NP    [facility]    Staff Present Johnnie Moats, MS, ACSM-CEP, Exercise Physiologist;Allea Kassner Claudene Candia Levin, RN, BSN;Carlette Carlton, RN, BSN    Virtual Visit No    Medication changes reported     No    Fall or balance concerns reported    No    Tobacco Cessation No Change    Warm-up and Cool-down Performed as group-led Writer Performed Yes    VAD Patient? No    PAD/SET Patient? No      Pain Assessment   Currently in Pain? No/denies    Multiple Pain Sites No          Capillary Blood Glucose: No results found for this or any previous visit (from the past 24 hours).    Social History   Tobacco Use  Smoking Status Former   Current packs/day: 0.00   Average packs/day: 1 pack/day for 7.0 years (7.0 ttl pk-yrs)   Types: Cigarettes   Start date: 11/06/1990   Quit date: 11/05/1997   Years since quitting: 26.1  Smokeless Tobacco Never    Goals Met:  Proper associated with RPD/PD & O2 Sat Independence with exercise equipment Exercise tolerated well No report of concerns or symptoms today Strength training completed today  Goals Unmet:  Not Applicable  Comments: Service time is from 1010 to 1130.    Dr. Slater Staff is Medical Director for Pulmonary Rehab at [facility].

## 2023-12-19 ENCOUNTER — Encounter (HOSPITAL_COMMUNITY)
Admission: RE | Admit: 2023-12-19 | Discharge: 2023-12-19 | Disposition: A | Discharge status: Home / Self Care | Source: Ambulatory Visit | Attending: Pulmonary Disease

## 2023-12-19 VITALS — Wt 220.5 lb

## 2023-12-19 DIAGNOSIS — J449 Chronic obstructive pulmonary disease, unspecified: Secondary | ICD-10-CM

## 2023-12-19 NOTE — Progress Notes (Signed)
 Daily Session Note  Patient Details  Name: Marvin Chandler MRN: [id number] Date of Birth: July 11, 1950 Referring Provider:   [name] Pulmonary Rehab Walk Test from 11/10/2023 in [facility] for Heart, Vascular, & Lung Health  Referring Provider [name]    Encounter Date: 12/19/2023  Check In:  Session Check In - 12/19/23 1212       Check-In   Supervising physician immediately available to respond to emergencies [facility] MD immediately available    Physician(s) Lum Louis, NP    [facility]    Staff Present Johnnie Moats, MS, ACSM-CEP, Exercise Physiologist;Casey Claudene Candia Levin, RN, BSN;Carlette Carlton, RN, [employer], MS, ACSM-CEP, CCRP, Exercise Physiologist;Joseph Lennon, Charity fundraiser, BSN;Theadora Byes, RN, MHA;Maria Whitaker, RN, BSN    Virtual Visit No    Medication changes reported     No    Fall or balance concerns reported    No    Tobacco Cessation No Change    Warm-up and Cool-down Performed as group-led Writer Performed Yes    VAD Patient? No    PAD/SET Patient? No      Pain Assessment   Currently in Pain? No/denies    Pain Score 0-No pain    Multiple Pain Sites No          Capillary Blood Glucose: No results found for this or any previous visit (from the past 24 hours).   Exercise Prescription Changes - 12/19/23 1200       Response to Exercise   Blood Pressure (Admit) 110/62    Blood Pressure (Exercise) 110/66    Blood Pressure (Exit) 104/62    Heart Rate (Admit) 97 bpm    Heart Rate (Exercise) 103 bpm    Heart Rate (Exit) 97 bpm    Oxygen Saturation (Admit) 99 %    Oxygen Saturation (Exercise) 98 %    Oxygen Saturation (Exit) 96 %    Rating of Perceived Exertion (Exercise) 13    Perceived Dyspnea (Exercise) 1    Duration Progress to 30 minutes of  aerobic without signs/symptoms of physical distress    Intensity THRR unchanged      Progression    Progression Continue to progress workloads to maintain intensity without signs/symptoms of physical distress.      Resistance Training   Training Prescription Yes    Weight blue bands    Reps 10-15    Time 10 Minutes      Recumbant Bike   Level 3    Watts 89    Minutes 15    METs 3.2      NuStep   Level 3    SPM 85    Minutes 15    METs 2.6          Social History   Tobacco Use  Smoking Status Former   Current packs/day: 0.00   Average packs/day: 1 pack/day for 7.0 years (7.0 ttl pk-yrs)   Types: Cigarettes   Start date: 11/06/1990   Quit date: 11/05/1997   Years since quitting: 26.1  Smokeless Tobacco Never    Goals Met:  Exercise tolerated well Queuing for purse lip breathing No report of concerns or symptoms today Strength training completed today  Goals Unmet:  Not Applicable  Comments: Service time is from 1023 to 1140    Dr. Slater Staff is Medical Director for Pulmonary Rehab at [facility].

## 2023-12-21 ENCOUNTER — Encounter (HOSPITAL_COMMUNITY)
Admission: RE | Admit: 2023-12-21 | Discharge: 2023-12-21 | Disposition: A | Discharge status: Home / Self Care | Source: Ambulatory Visit | Attending: Pulmonary Disease

## 2023-12-21 DIAGNOSIS — J449 Chronic obstructive pulmonary disease, unspecified: Secondary | ICD-10-CM

## 2023-12-21 NOTE — Progress Notes (Signed)
 Daily Session Note  Patient Details  Name: Marvin Chandler MRN: [id number] Date of Birth: [birth year] Referring Provider:   [name] Pulmonary Rehab Walk Test from 11/10/2023 in [facility] for Heart, Vascular, & Lung Health  Referring Provider [name]    Encounter Date: 12/21/2023  Check In:  Session Check In - 12/21/23 1028       Check-In   Supervising physician immediately available to respond to emergencies [facility] MD immediately available    Physician(s) Rosaline Bane, NP    [facility]    Staff Present Johnnie Moats, MS, ACSM-CEP, Exercise Physiologist;Casey Claudene Candia Levin, RN, BSN;Randi Reeve BS, ACSM-CEP, Exercise Physiologist    Virtual Visit No    Medication changes reported     No    Fall or balance concerns reported    No    Tobacco Cessation No Change    Warm-up and Cool-down Performed as group-led instruction    Resistance Training Performed Yes    VAD Patient? No    PAD/SET Patient? No      Pain Assessment   Currently in Pain? No/denies          Capillary Blood Glucose: No results found for this or any previous visit (from the past 24 hours).    Social History   Tobacco Use  Smoking Status Former   Current packs/day: 0.00   Average packs/day: 1 pack/day for 7.0 years (7.0 ttl pk-yrs)   Types: Cigarettes   Start date: 11/06/1990   Quit date: 11/05/1997   Years since quitting: 26.1  Smokeless Tobacco Never    Goals Met:  Independence with exercise equipment Exercise tolerated well No report of concerns or symptoms today Strength training completed today  Goals Unmet:  Not Applicable  Comments: Service time is from 1017 to 1141    Dr. Slater Staff is Medical Director for Pulmonary Rehab at [facility].

## 2023-12-26 ENCOUNTER — Encounter (HOSPITAL_COMMUNITY)
Admission: RE | Admit: 2023-12-26 | Discharge: 2023-12-26 | Disposition: A | Discharge status: Home / Self Care | Source: Ambulatory Visit | Attending: Pulmonary Disease | Admitting: Pulmonary Disease

## 2023-12-26 DIAGNOSIS — J449 Chronic obstructive pulmonary disease, unspecified: Secondary | ICD-10-CM

## 2023-12-26 NOTE — Progress Notes (Signed)
 Daily Session Note  Patient Details  Name: Marvin Chandler MRN: [id number] Date of Birth: 07-25-1950 Referring Provider:   [name] Pulmonary Rehab Walk Test from 11/10/2023 in [facility] for Heart, Vascular, & Lung Health  Referring Provider [name]    Encounter Date: 12/26/2023  Check In:  Session Check In - 12/26/23 1028       Check-In   Supervising physician immediately available to respond to emergencies [facility] MD immediately available    Physician(s) Rosaline Bane, NP    Location [facility]    Staff Present Johnnie Moats, MS, ACSM-CEP, Exercise Physiologist;Ramyah Pankowski Claudene Candia Levin, RN, BSN;Randi Reeve BS, ACSM-CEP, Exercise Physiologist    Virtual Visit No    Medication changes reported     No    Fall or balance concerns reported    No    Tobacco Cessation No Change    Warm-up and Cool-down Performed as group-led instruction    Resistance Training Performed Yes    VAD Patient? No    PAD/SET Patient? No      Pain Assessment   Currently in Pain? No/denies    Pain Score 0-No pain    Multiple Pain Sites No          Capillary Blood Glucose: No results found for this or any previous visit (from the past 24 hours).    Social History   Tobacco Use  Smoking Status Former   Current packs/day: 0.00   Average packs/day: 1 pack/day for 7.0 years (7.0 ttl pk-yrs)   Types: Cigarettes   Start date: 11/06/1990   Quit date: 11/05/1997   Years since quitting: 26.1  Smokeless Tobacco Never    Goals Met:  Proper associated with RPD/PD & O2 Sat Independence with exercise equipment Exercise tolerated well No report of concerns or symptoms today Strength training completed today  Goals Unmet:  Not Applicable  Comments: Service time is from 1018 to 1135.    Dr. Slater Staff is Medical Director for Pulmonary Rehab at [facility].

## 2023-12-28 ENCOUNTER — Encounter (HOSPITAL_COMMUNITY)
Admission: RE | Admit: 2023-12-28 | Discharge: 2023-12-28 | Disposition: A | Discharge status: Home / Self Care | Source: Ambulatory Visit | Attending: Pulmonary Disease

## 2023-12-28 DIAGNOSIS — J449 Chronic obstructive pulmonary disease, unspecified: Secondary | ICD-10-CM

## 2023-12-28 NOTE — Progress Notes (Signed)
 Daily Session Note  Patient Details  Name: Marvin Chandler MRN: [id number] Date of Birth: 1951/04/12 Referring Provider:   [name] Pulmonary Rehab Walk Test from 11/10/2023 in [facility]  Referring Provider [name]    Encounter Date: 12/28/2023  Check In:  Session Check In - 12/28/23 1028       Check-In   Supervising physician immediately available to respond to emergencies [facility] MD immediately available    Physician(s) Jackee Alberts, NP    Location [facility]    Staff Present Johnnie Moats, MS, ACSM-CEP, Exercise Physiologist;Garett Tetzloff Claudene Candia Levin, RN, BSN;Randi Reeve BS, ACSM-CEP, Exercise Physiologist    Virtual Visit No    Medication changes reported     No    Fall or balance concerns reported    No    Tobacco Cessation No Change    Warm-up and Cool-down Performed as group-led instruction    Resistance Training Performed Yes    VAD Patient? No    PAD/SET Patient? No      Pain Assessment   Currently in Pain? No/denies    Multiple Pain Sites No          Capillary Blood Glucose: No results found for this or any previous visit (from the past 24 hours).    Social History   Tobacco Use  Smoking Status Former   Current packs/day: 0.00   Average packs/day: 1 pack/day for 7.0 years (7.0 ttl pk-yrs)   Types: Cigarettes   Start date: 11/06/1990   Quit date: 11/05/1997   Years since quitting: 26.1  Smokeless Tobacco Never    Goals Met:  Proper associated with RPD/PD & O2 Sat Independence with exercise equipment Exercise tolerated well No report of concerns or symptoms today Strength training completed today  Goals Unmet:  Not Applicable  Comments: Service time is from 1021 to 1136.    Dr. Slater Staff is Medical Director for Pulmonary Rehab at [facility].

## 2024-01-02 ENCOUNTER — Encounter (HOSPITAL_COMMUNITY)
Admission: RE | Admit: 2024-01-02 | Discharge: 2024-01-02 | Disposition: A | Discharge status: Home / Self Care | Source: Ambulatory Visit | Attending: Pulmonary Disease | Admitting: Pulmonary Disease

## 2024-01-02 VITALS — Wt 221.8 lb

## 2024-01-02 DIAGNOSIS — J449 Chronic obstructive pulmonary disease, unspecified: Secondary | ICD-10-CM | POA: Insufficient documentation

## 2024-01-02 NOTE — Progress Notes (Signed)
 Daily Session Note  Patient Details  Name: Marvin Chandler MRN: [id number] Date of Birth: 10-06-1950 Referring Provider:   [name] Pulmonary Rehab Walk Test from 11/10/2023 in [facility]  Referring Provider [name]    Encounter Date: 01/02/2024  Check In:  Session Check In - 01/02/24 1023       Check-In   Supervising physician immediately available to respond to emergencies [facility] MD immediately available    Physician(s) Josefa Beauvais, NP    Location [facility]    Staff Present Johnnie Moats, MS, ACSM-CEP, Exercise Physiologist;Casey Claudene Candia Levin, RN, BSN;Raymon Schlarb BS, ACSM-CEP, Exercise Physiologist    Virtual Visit No    Medication changes reported     No    Fall or balance concerns reported    No    Tobacco Cessation No Change    Warm-up and Cool-down Performed as group-led instruction    Resistance Training Performed Yes    VAD Patient? No    PAD/SET Patient? No      Pain Assessment   Currently in Pain? No/denies          Capillary Blood Glucose: No results found for this or any previous visit (from the past 24 hours).   Exercise Prescription Changes - 01/02/24 1100       Response to Exercise   Blood Pressure (Admit) 110/66    Blood Pressure (Exercise) 120/60    Blood Pressure (Exit) 96/58    Heart Rate (Admit) 86 bpm    Heart Rate (Exercise) 113 bpm    Heart Rate (Exit) 93 bpm    Oxygen Saturation (Admit) 97 %    Oxygen Saturation (Exercise) 97 %    Oxygen Saturation (Exit) 97 %    Rating of Perceived Exertion (Exercise) 13    Perceived Dyspnea (Exercise) 0    Duration Continue with 30 min of aerobic exercise without signs/symptoms of physical distress.    Intensity THRR unchanged      Progression   Progression Continue to progress workloads to maintain intensity without signs/symptoms of physical distress.      Resistance Training   Training Prescription Yes     Weight blue bands    Reps 10-15    Time 10 Minutes      Interval Training   Interval Training No      Recumbant Bike   Level 3    RPM 66    Watts 36    Minutes 15    METs 2.9      NuStep   Level 3    SPM 101    Minutes 15    METs 3.1          Social History   Tobacco Use  Smoking Status Former   Current packs/day: 0.00   Average packs/day: 1 pack/day for 7.0 years (7.0 ttl pk-yrs)   Types: Cigarettes   Start date: 11/06/1990   Quit date: 11/05/1997   Years since quitting: 26.1  Smokeless Tobacco Never    Goals Met:  Independence with exercise equipment Exercise tolerated well Personal goals reviewed Strength training completed today  Goals Unmet:  Not Applicable  Comments: Service time is from 1014 to 1135.    Dr. Slater Staff is Medical Director for Pulmonary Rehab at [facility].

## 2024-01-03 NOTE — Progress Notes (Signed)
 Pulmonary Individual Treatment Plan  Patient Details  Name: Marvin Chandler MRN: [id number] Date of Birth: 07-Aug-1950 Referring Provider:   [name] Pulmonary Rehab Walk Test from 11/10/2023 in [facility]  Referring Provider [name]    Initial Encounter Date:  Flowsheet Row Pulmonary Rehab Walk Test from 11/10/2023 in [facility]  Date 11/10/23    Visit Diagnosis: Stage 2 moderate COPD by GOLD classification (HCC)  Patient's Home Medications on Admission:   Current Outpatient Medications:    albuterol  (PROVENTIL ) (2.5 MG/3ML) 0.083% nebulizer solution, Take 3 mLs (2.5 mg total) by nebulization every 6 (six) hours as needed for wheezing or shortness of breath., Disp: 300 mL, Rfl: 5   albuterol  (VENTOLIN  HFA) 108 (90 Base) MCG/ACT inhaler, Inhale 2 puffs into the lungs every 4 (four) hours as needed., Disp: 18 g, Rfl: 6   atorvastatin  (LIPITOR ) 80 MG tablet, Take 80 mg by mouth at bedtime., Disp: , Rfl:    empagliflozin (JARDIANCE) 25 MG TABS tablet, Take 12.5 mg by mouth daily. Take one half tablet daily, Disp: , Rfl:    EPINEPHrine  0.3 mg/0.3 mL IJ SOAJ injection, Inject 0.3 mLs (0.3 mg total) into the muscle once., Disp: 1 Device, Rfl: 11   fluticasone  (FLONASE ) 50 MCG/ACT nasal spray, Place 2 sprays into both nostrils daily., Disp: 16 g, Rfl: 5   fluticasone -salmeterol (ADVAIR) 250-50 MCG/ACT AEPB, Inhale 1 puff into the lungs daily., Disp: , Rfl:    furosemide  (LASIX ) 40 MG tablet, Take 40 mg by mouth 2 (two) times daily with a meal., Disp: , Rfl:    gabapentin (NEURONTIN) 300 MG capsule, Take 300 mg by mouth at bedtime., Disp: , Rfl:    isosorbide  mononitrate (IMDUR ) 30 MG 24 hr tablet, Take 1 tablet (30 mg total) by mouth daily., Disp: 30 tablet, Rfl: 2   loratadine  (CLARITIN ) 10 MG tablet, Take 10 mg by mouth daily., Disp: , Rfl:    metFORMIN (GLUCOPHAGE) 500  MG tablet, Take 500 mg by mouth daily with breakfast., Disp: , Rfl:    montelukast  (SINGULAIR ) 10 MG tablet, TAKE 1 TABLET BY MOUTH AT BEDTIME, Disp: 30 tablet, Rfl: 30   nitroGLYCERIN  (NITROSTAT ) 0.4 MG SL tablet, Place 1 tablet (0.4 mg total) under the tongue every 5 (five) minutes as needed., Disp: 25 tablet, Rfl: 3   omeprazole  (PRILOSEC) 20 MG capsule, Take 40 mg by mouth daily., Disp: , Rfl:    polyvinyl alcohol (LIQUIFILM TEARS) 1.4 % ophthalmic solution, Place 1 drop into both eyes 4 (four) times daily as needed for dry eyes., Disp: , Rfl:    rivaroxaban  (XARELTO ) 20 MG TABS tablet, Take 1 tablet (20 mg total) by mouth daily with supper., Disp: 30 tablet, Rfl: 6   sacubitril -valsartan  (ENTRESTO ) 97-103 MG, Take 1 tablet by mouth 2 (two) times daily., Disp: 60 tablet, Rfl: 11   spironolactone  (ALDACTONE ) 25 MG tablet, Take 0.5 tablets (12.5 mg total) by mouth daily., Disp: 15 tablet, Rfl: 2   terbinafine (LAMISIL) 1 % cream, Apply 1 application topically daily as needed (athelete's foot and nail fungus)., Disp: , Rfl:    terbinafine (LAMISIL) 250 MG tablet, Take 250 mg by mouth daily., Disp: , Rfl:   Past Medical History: Past Medical History:  Diagnosis Date   Arthritis of hip    bilateral   Asthma    CAD (coronary artery disease)    LHC 5/16:  Dx ostial 30%, LAD luminal irregs, EF 40%   Chronic atrial fibrillation (HCC) 07/29/2019   Chronic systolic CHF (congestive heart failure) (HCC)    a. Echo 3/16:  inf and inf-lat HK, mild LVH, EF 45%, mild to mod LAE, normal RVF   Cough    Hiatal hernia    NICM (nonischemic cardiomyopathy) (HCC)    Osteoarthritis    Personal history of colonic adenomas 02/20/2013   Pneumonia     Tobacco Use: Social History   Tobacco Use  Smoking Status Former   Current packs/day: 0.00   Average packs/day: 1 pack/day for 7.0 years (7.0 ttl pk-yrs)   Types: Cigarettes   Start date: 11/06/1990   Quit date: 11/05/1997   Years since quitting: 26.1   Smokeless Tobacco Never    Labs: Review Flowsheet  More data exists      Latest Ref Rng & Units 06/23/2016 09/12/2016 11/09/2017 06/14/2019 09/25/2020  Labs for ITP Cardiac and Pulmonary Rehab  Cholestrol 100 - 199 mg/dL 847  847  877  75  887   LDL (calc) 0 - 99 mg/dL 89  86  71  32  59   HDL-C >39 mg/dL 37  40  32  30  39   Trlycerides 0 - 149 mg/dL 869  867  96  52  63   Hemoglobin A1c 4.8 - 5.6 % - - 6.5  - -    Capillary Blood Glucose: Lab Results  Component Value Date   GLUCAP 83 11/23/2023   GLUCAP 152 (H) 11/23/2023   GLUCAP 96 11/21/2023   GLUCAP 165 (H) 11/21/2023   GLUCAP 121 (H) 11/10/2023    POCT Glucose     Row Name 11/10/23 0959             POCT Blood Glucose   Pre-Exercise 121 mg/dL          Pulmonary Assessment Scores:  Pulmonary Assessment Scores     Row Name 11/10/23 1032         ADL UCSD   ADL Phase Entry     SOB Score total 82       CAT Score   CAT Score 25       mMRC Score   mMRC Score 4       UCSD: Self-administered rating of dyspnea associated with activities of daily living (ADLs) 6-point scale (0 = not at all to 5 = maximal or unable to do because of breathlessness)  Scoring Scores range from 0 to 120.  Minimally important difference is 5 units  CAT: CAT can identify the health impairment of COPD patients and is better correlated with disease progression.  CAT has a scoring range of zero to 40. The CAT score is classified into four groups of low (less than 10), medium (10 - 20), high (21-30) and very high (31-40) based on the impact level of disease on health status. A CAT score over 10 suggests significant symptoms.  A worsening CAT score could be explained by an exacerbation, poor medication adherence, poor inhaler technique, or progression of COPD or comorbid conditions.  CAT MCID is 2 points  mMRC: mMRC (Modified Medical Research Council) Dyspnea Scale is used to assess the degree of baseline functional disability in  patients of respiratory disease due to dyspnea. No minimal important difference is established. A decrease in score of 1 point or greater is considered a positive change.   Pulmonary Function Assessment:  Pulmonary Function Assessment - 11/10/23 1003  Breath   Bilateral Breath Sounds Clear    Shortness of Breath Fear of Shortness of Breath;Limiting activity          Exercise Target Goals: Exercise Program Goal: Individual exercise prescription set using results from initial 6 min walk test and THRR while considering  patient's activity barriers and safety.   Exercise Prescription Goal: Initial exercise prescription builds to 30-45 minutes a day of aerobic activity, 2-3 days per week.  Home exercise guidelines will be given to patient during program as part of exercise prescription that the participant will acknowledge.  Activity Barriers & Risk Stratification:  Activity Barriers & Cardiac Risk Stratification - 11/10/23 0959       Activity Barriers & Cardiac Risk Stratification   Activity Barriers Muscular Weakness;Shortness of Breath;Deconditioning;Arthritis;Left Hip Replacement;Right Hip Replacement    Cardiac Risk Stratification Moderate          6 Minute Walk:  6 Minute Walk     Row Name 11/10/23 1038         6 Minute Walk   Phase Initial     Distance 1037 feet     Walk Time 6 minutes     # of Rest Breaks 0     MPH 1.96     METS 1.97     RPE 11     Perceived Dyspnea  1     VO2 Peak 6.89     Symptoms No     Resting HR 81 bpm     Resting BP 92/60     Resting Oxygen Saturation  98 %     Exercise Oxygen Saturation  during 6 min walk 92 %     Max Ex. HR 100 bpm     Max Ex. BP 112/50     2 Minute Post BP 94/50       Interval HR   1 Minute HR 96     2 Minute HR 99     3 Minute HR 100     4 Minute HR 98     5 Minute HR 100     6 Minute HR 99     2 Minute Post HR 84     Interval Heart Rate? Yes       Interval Oxygen   Interval Oxygen? Yes      Baseline Oxygen Saturation % 98 %     1 Minute Oxygen Saturation % 96 %     1 Minute Liters of Oxygen 0 L     2 Minute Oxygen Saturation % 98 %     2 Minute Liters of Oxygen 0 L     3 Minute Oxygen Saturation % 94 %     3 Minute Liters of Oxygen 0 L     4 Minute Oxygen Saturation % 95 %     4 Minute Liters of Oxygen 0 L     5 Minute Oxygen Saturation % 92 %     5 Minute Liters of Oxygen 0 L     6 Minute Oxygen Saturation % 91 %     6 Minute Liters of Oxygen 0 L     2 Minute Post Oxygen Saturation % 100 %     2 Minute Post Liters of Oxygen 0 L        Oxygen Initial Assessment:  Oxygen Initial Assessment - 11/10/23 1000       Home Oxygen   Home Oxygen Device None    Sleep Oxygen Prescription  CPAP   not compliant   Home Exercise Oxygen Prescription None    Home Resting Oxygen Prescription None      Initial 6 min Walk   Oxygen Used None      Program Oxygen Prescription   Program Oxygen Prescription None      Intervention   Short Term Goals To learn and understand importance of maintaining oxygen saturations>88%;To learn and demonstrate proper use of respiratory medications;To learn and understand importance of monitoring SPO2 with pulse oximeter and demonstrate accurate use of the pulse oximeter.;To learn and demonstrate proper pursed lip breathing techniques or other breathing techniques.     Long  Term Goals Maintenance of O2 saturations>88%;Compliance with respiratory medication;Verbalizes importance of monitoring SPO2 with pulse oximeter and return demonstration;Exhibits proper breathing techniques, such as pursed lip breathing or other method taught during program session;Demonstrates proper use of MDI's          Oxygen Re-Evaluation:  Oxygen Re-Evaluation     Row Name 11/29/23 0841 12/26/23 1630           Program Oxygen Prescription   Program Oxygen Prescription None None        Home Oxygen   Home Oxygen Device None None      Sleep Oxygen Prescription CPAP   not compliant CPAP  not compliant      Home Exercise Oxygen Prescription None None      Home Resting Oxygen Prescription None None        Goals/Expected Outcomes   Short Term Goals To learn and understand importance of maintaining oxygen saturations>88%;To learn and demonstrate proper use of respiratory medications;To learn and understand importance of monitoring SPO2 with pulse oximeter and demonstrate accurate use of the pulse oximeter.;To learn and demonstrate proper pursed lip breathing techniques or other breathing techniques.  To learn and understand importance of maintaining oxygen saturations>88%;To learn and demonstrate proper use of respiratory medications;To learn and understand importance of monitoring SPO2 with pulse oximeter and demonstrate accurate use of the pulse oximeter.;To learn and demonstrate proper pursed lip breathing techniques or other breathing techniques.       Long  Term Goals Maintenance of O2 saturations>88%;Compliance with respiratory medication;Verbalizes importance of monitoring SPO2 with pulse oximeter and return demonstration;Exhibits proper breathing techniques, such as pursed lip breathing or other method taught during program session;Demonstrates proper use of MDI's Maintenance of O2 saturations>88%;Compliance with respiratory medication;Verbalizes importance of monitoring SPO2 with pulse oximeter and return demonstration;Exhibits proper breathing techniques, such as pursed lip breathing or other method taught during program session;Demonstrates proper use of MDI's      Goals/Expected Outcomes Compliance and understanding of oxygen saturation monitoring and breathing techniques to decrease shortness of breath. Compliance and understanding of oxygen saturation monitoring and breathing techniques to decrease shortness of breath.         Oxygen Discharge (Final Oxygen Re-Evaluation):  Oxygen Re-Evaluation - 12/26/23 1630       Program Oxygen Prescription   Program  Oxygen Prescription None      Home Oxygen   Home Oxygen Device None    Sleep Oxygen Prescription CPAP   not compliant   Home Exercise Oxygen Prescription None    Home Resting Oxygen Prescription None      Goals/Expected Outcomes   Short Term Goals To learn and understand importance of maintaining oxygen saturations>88%;To learn and demonstrate proper use of respiratory medications;To learn and understand importance of monitoring SPO2 with pulse oximeter and demonstrate accurate use of the pulse oximeter.;To learn and  demonstrate proper pursed lip breathing techniques or other breathing techniques.     Long  Term Goals Maintenance of O2 saturations>88%;Compliance with respiratory medication;Verbalizes importance of monitoring SPO2 with pulse oximeter and return demonstration;Exhibits proper breathing techniques, such as pursed lip breathing or other method taught during program session;Demonstrates proper use of MDI's    Goals/Expected Outcomes Compliance and understanding of oxygen saturation monitoring and breathing techniques to decrease shortness of breath.          Initial Exercise Prescription:  Initial Exercise Prescription - 11/10/23 1000       Date of Initial Exercise RX and Referring Provider   Date 11/10/23    Referring Provider [name]    Expected Discharge Date 02/08/24      Recumbant Bike   Level 1    RPM 25    Watts 40    Minutes 15    METs 2      NuStep   Level 1    SPM 70    Minutes 15    METs 2.5      Prescription Details   Frequency (times per week) 2    Duration Progress to 30 minutes of continuous aerobic without signs/symptoms of physical distress      Intensity   THRR 40-80% of Max Heartrate 58-117    Ratings of Perceived Exertion 11-13    Perceived Dyspnea 0-4      Progression   Progression Continue to progress workloads to maintain intensity without signs/symptoms of physical distress.      Resistance Training   Training Prescription Yes     Weight blue bands    Reps 10-15          Perform Capillary Blood Glucose checks as needed.  Exercise Prescription Changes:   Exercise Prescription Changes     Row Name 11/21/23 1100 12/05/23 1100 12/19/23 1200 01/02/24 1100       Response to Exercise   Blood Pressure (Admit) 98/58 108/60 110/62 110/66    Blood Pressure (Exercise) 96/56 114/60 110/66 120/60    Blood Pressure (Exit) 98/70 112/60 104/62 96/58    Heart Rate (Admit) 91 bpm 87 bpm 97 bpm 86 bpm    Heart Rate (Exercise) 83 bpm 100 bpm 103 bpm 113 bpm    Heart Rate (Exit) 86 bpm 98 bpm 97 bpm 93 bpm    Oxygen Saturation (Admit) 96 % 98 % 99 % 97 %    Oxygen Saturation (Exercise) 98 % 96 % 98 % 97 %    Oxygen Saturation (Exit) 97 % 98 % 96 % 97 %    Rating of Perceived Exertion (Exercise) 13 15 13 13     Perceived Dyspnea (Exercise) 3 1 1  0    Duration Progress to 30 minutes of  aerobic without signs/symptoms of physical distress Progress to 30 minutes of  aerobic without signs/symptoms of physical distress Progress to 30 minutes of  aerobic without signs/symptoms of physical distress Continue with 30 min of aerobic exercise without signs/symptoms of physical distress.    Intensity THRR unchanged THRR unchanged THRR unchanged THRR unchanged      Progression   Progression Continue to progress workloads to maintain intensity without signs/symptoms of physical distress. Continue to progress workloads to maintain intensity without signs/symptoms of physical distress. Continue to progress workloads to maintain intensity without signs/symptoms of physical distress. Continue to progress workloads to maintain intensity without signs/symptoms of physical distress.      Resistance Training   Training Prescription Yes Yes  Yes Yes    Weight blue bands blue bands blue bands blue bands    Reps 10-15 10-15 10-15 10-15    Time 10 Minutes 10 Minutes 10 Minutes 10 Minutes      Interval Training   Interval Training -- -- -- No       Recumbant Bike   Level 1 3 3 3     RPM -- 101 -- 66    Watts -- 67 89 36    Minutes 15 15 15 15     METs 1.9 2.9 3.2 2.9      NuStep   Level 1 2 3 3     SPM 82 67 85 101    Minutes 15 15 15 15     METs 1.6 2.9 2.6 3.1       Exercise Comments:   Exercise Goals and Review:   Exercise Goals     Row Name 11/10/23 1000             Exercise Goals   Increase Physical Activity Yes       Intervention Provide advice, education, support and counseling about physical activity/exercise needs.;Develop an individualized exercise prescription for aerobic and resistive training based on initial evaluation findings, risk stratification, comorbidities and participant's personal goals.       Expected Outcomes Short Term: Attend rehab on a regular basis to increase amount of physical activity.;Long Term: Exercising regularly at least 3-5 days a week.;Long Term: Add in home exercise to make exercise part of routine and to increase amount of physical activity.       Increase Strength and Stamina Yes       Intervention Provide advice, education, support and counseling about physical activity/exercise needs.;Develop an individualized exercise prescription for aerobic and resistive training based on initial evaluation findings, risk stratification, comorbidities and participant's personal goals.       Expected Outcomes Short Term: Increase workloads from initial exercise prescription for resistance, speed, and METs.;Short Term: Perform resistance training exercises routinely during rehab and add in resistance training at home;Long Term: Improve cardiorespiratory fitness, muscular endurance and strength as measured by increased METs and functional capacity ( )       Able to understand and use rate of perceived exertion (RPE) scale Yes       Intervention Provide education and explanation on how to use RPE scale       Expected Outcomes Short Term: Able to use RPE daily in rehab to express subjective intensity  level;Long Term:  Able to use RPE to guide intensity level when exercising independently       Able to understand and use Dyspnea scale Yes       Intervention Provide education and explanation on how to use Dyspnea scale       Expected Outcomes Short Term: Able to use Dyspnea scale daily in rehab to express subjective sense of shortness of breath during exertion;Long Term: Able to use Dyspnea scale to guide intensity level when exercising independently       Knowledge and understanding of Target Heart Rate Range (THRR) Yes       Intervention Provide education and explanation of THRR including how the numbers were predicted and where they are located for reference       Expected Outcomes Short Term: Able to state/look up THRR;Long Term: Able to use THRR to govern intensity when exercising independently;Short Term: Able to use daily as guideline for intensity in rehab       Understanding of Exercise Prescription  Yes       Intervention Provide education, explanation, and written materials on patient's individual exercise prescription       Expected Outcomes Short Term: Able to explain program exercise prescription;Long Term: Able to explain home exercise prescription to exercise independently          Exercise Goals Re-Evaluation :  Exercise Goals Re-Evaluation     Row Name 11/29/23 9161 12/26/23 1629           Exercise Goal Re-Evaluation   Exercise Goals Review Increase Physical Activity;Able to understand and use Dyspnea scale;Understanding of Exercise Prescription;Increase Strength and Stamina;Knowledge and understanding of Target Heart Rate Range (THRR);Able to understand and use rate of perceived exertion (RPE) scale Increase Physical Activity;Able to understand and use Dyspnea scale;Understanding of Exercise Prescription;Increase Strength and Stamina;Knowledge and understanding of Target Heart Rate Range (THRR);Able to understand and use rate of perceived exertion (RPE) scale      Comments  Marvin Chandler has completed 3 exercise sessions. He exercises for 15 min on the Nustep and recumbent bike. He averages 2.3 METs at level 2 on the Nustep and 2.7 METs at level 2 on the recumbent bike. He performs the warmup and cooldown standing without limitations. It is too soon to notate any discernable progressions at this time. Will continue to monitor and progress as able. Marvin Chandler has completed 10 exercise sessions. He exercises for 15 min on the Nustep and recumbent bike. He averages 2.5 METs at level 3 on the Nustep and 3.1 METs at level 3 on the recumbent bike. He performs the warmup and cooldown standing without limitations. Marvin Chandler has increased his level on the Nustep and recumbent bike as METs have increased. He tolerates progressions well. Will continue to monitor and progress as able.      Expected Outcomes Through exercise at rehab and home, the patient will decrease shortness of breath with daily activities and feel confident in carrying out an exercise regimen at home. Through exercise at rehab and home, the patient will decrease shortness of breath with daily activities and feel confident in carrying out an exercise regimen at home.         Discharge Exercise Prescription (Final Exercise Prescription Changes):  Exercise Prescription Changes - 01/02/24 1100       Response to Exercise   Blood Pressure (Admit) 110/66    Blood Pressure (Exercise) 120/60    Blood Pressure (Exit) 96/58    Heart Rate (Admit) 86 bpm    Heart Rate (Exercise) 113 bpm    Heart Rate (Exit) 93 bpm    Oxygen Saturation (Admit) 97 %    Oxygen Saturation (Exercise) 97 %    Oxygen Saturation (Exit) 97 %    Rating of Perceived Exertion (Exercise) 13    Perceived Dyspnea (Exercise) 0    Duration Continue with 30 min of aerobic exercise without signs/symptoms of physical distress.    Intensity THRR unchanged      Progression   Progression Continue to progress workloads to maintain intensity without signs/symptoms of  physical distress.      Resistance Training   Training Prescription Yes    Weight blue bands    Reps 10-15    Time 10 Minutes      Interval Training   Interval Training No      Recumbant Bike   Level 3    RPM 66    Watts 36    Minutes 15    METs 2.9      NuStep  Level 3    SPM 101    Minutes 15    METs 3.1          Nutrition:  Target Goals: Understanding of nutrition guidelines, daily intake of sodium 1500mg , cholesterol 200mg , calories 30% from fat and 7% or less from saturated fats, daily to have 5 or more servings of fruits and vegetables.  Biometrics:    Nutrition Therapy Plan and Nutrition Goals:  Nutrition Therapy & Goals - 12/21/23 1145       Nutrition Therapy   Diet Heart Healthy Diet    Drug/Food Interactions Statins/Certain Fruits      Personal Nutrition Goals   Nutrition Goal Patient to improve diet quality by using the plate method as a guide for meal planning to include lean protein/plant protein, fruits, vegetables, whole grains, nonfat dairy as part of a well-balanced diet.    Comments Goals in progress. Marvin Chandler has medical history of DM2(metformin, jardiance), PAF, HTN, cardiomyopathy, CHF(lasix , entresto ), OSA, COPD2. He reports eating 1-2 meals per day and multiple snacks. He often eats out with his wife but is mindful of sodium intake and vegetable intake. He weighs ~3x/week and checks blood sugar a few times per week as well. LDL is well controlled. A1c is slightly above goal. He has maintained his weight since starting with our program. Patient will benefit from participation in pulmonary rehab for nutrition education, exercise, and lifestyle modification.      Intervention Plan   Intervention Prescribe, educate and counsel regarding individualized specific dietary modifications aiming towards targeted core components such as weight, hypertension, lipid management, diabetes, heart failure and other comorbidities.;Nutrition handout(s) given to  patient.    Expected Outcomes Short Term Goal: Understand basic principles of dietary content, such as calories, fat, sodium, cholesterol and nutrients.;Long Term Goal: Adherence to prescribed nutrition plan.          Nutrition Assessments:  MEDIFICTS Score Key: >=70 Need to make dietary changes  40-70 Heart Healthy Diet <= 40 Therapeutic Level Cholesterol Diet   Picture Your Plate Scores: <59 Unhealthy dietary pattern with much room for improvement. 41-50 Dietary pattern unlikely to meet recommendations for good health and room for improvement. 51-60 More healthful dietary pattern, with some room for improvement.  >60 Healthy dietary pattern, although there may be some specific behaviors that could be improved.    Nutrition Goals Re-Evaluation:  Nutrition Goals Re-Evaluation     Row Name 11/21/23 1106 12/21/23 1145           Goals   Current Weight 220 lb 14.4 oz (100.2 kg) 216 lb 4.3 oz (98.1 kg)      Comment A1c 7.1, LDL 61, HD l33 A1c 7.1, LDL 61, HDL l33      Expected Outcome Marvin Chandler has medical history of DM2(metformin, jardiance), PAF, HTN, cardiomyopathy, CHF(lasix , entresto ), OSA, COPD2. He reports eating 1-2 meals per day and multiple snacks. He often eats out with his wife but is mindful of sodium intake and vegetable intake. He weighs ~3x/week and checks blood sugar a few times per week as well. Will continue to monitor weight while enrolled in pulmonary rehab. LDL is well controlled. A1c is slightly above goal. Patient will benefit from participation in pulmonary rehab for nutrition education, exercise, and lifestyle modification. Goals in progress. Marvin Chandler has medical history of DM2(metformin, jardiance), PAF, HTN, cardiomyopathy, CHF(lasix , entresto ), OSA, COPD2. He reports eating 1-2 meals per day and multiple snacks. He often eats out with his wife but is mindful of sodium intake  and vegetable intake. He weighs ~3x/week and checks blood sugar a few times per week as  well. LDL is well controlled. A1c is slightly above goal. He has maintained his weight since starting with our program. Patient will benefit from participation in pulmonary rehab for nutrition education, exercise, and lifestyle modification.         Nutrition Goals Discharge (Final Nutrition Goals Re-Evaluation):  Nutrition Goals Re-Evaluation - 12/21/23 1145       Goals   Current Weight 216 lb 4.3 oz (98.1 kg)    Comment A1c 7.1, LDL 61, HDL l33    Expected Outcome Goals in progress. Marvin Chandler has medical history of DM2(metformin, jardiance), PAF, HTN, cardiomyopathy, CHF(lasix , entresto ), OSA, COPD2. He reports eating 1-2 meals per day and multiple snacks. He often eats out with his wife but is mindful of sodium intake and vegetable intake. He weighs ~3x/week and checks blood sugar a few times per week as well. LDL is well controlled. A1c is slightly above goal. He has maintained his weight since starting with our program. Patient will benefit from participation in pulmonary rehab for nutrition education, exercise, and lifestyle modification.          Psychosocial: Target Goals: Acknowledge presence or absence of significant depression and/or stress, maximize coping skills, provide positive support system. Participant is able to verbalize types and ability to use techniques and skills needed for reducing stress and depression.  Initial Review & Psychosocial Screening:  Initial Psych Review & Screening - 11/10/23 0959       Initial Review   Current issues with History of Depression   PTSD     Family Dynamics   Good Support System? Yes      Barriers   Psychosocial barriers to participate in program There are no identifiable barriers or psychosocial needs.      Screening Interventions   Interventions Encouraged to exercise          Quality of Life Scores:  Scores of 19 and below usually indicate a poorer quality of life in these areas.  A difference of  2-3 points is a  clinically meaningful difference.  A difference of 2-3 points in the total score of the Quality of Life Index has been associated with significant improvement in overall quality of life, self-image, physical symptoms, and general health in studies assessing change in quality of life.  PHQ-9: Review Flowsheet       11/10/2023 10/31/2014 02/19/2014 12/31/2013  Depression screen PHQ 2/9  Decreased Interest 0 0 0 0  Down, Depressed, Hopeless 0 0 0 0  PHQ - 2 Score 0 0 0 0  Altered sleeping 1 - - -  Tired, decreased energy 2 - - -  Change in appetite 0 - - -  Feeling bad or failure about yourself  0 - - -  Trouble concentrating 0 - - -  Moving slowly or fidgety/restless 0 - - -  Suicidal thoughts 0 - - -  PHQ-9 Score 3 - - -  Difficult doing work/chores Not difficult at all - - -   Interpretation of Total Score  Total Score Depression Severity:  1-4 = Minimal depression, 5-9 = Mild depression, 10-14 = Moderate depression, 15-19 = Moderately severe depression, 20-27 = Severe depression   Psychosocial Evaluation and Intervention:  Psychosocial Evaluation - 11/10/23 1026       Psychosocial Evaluation & Interventions   Interventions Encouraged to exercise with the program and follow exercise prescription    Comments Marvin Chandler  states he has a history of PTSD and feels like it is starting to get worse. He has attended a class at the [facility] in the past for his PTSD. He states he will reach out to the [facility] to see if he can take the class again. Denies any needs or referrals from us  at this time.    Expected Outcomes For Marvin Chandler to participate in PR free of any psychosocial barriers or concerns    Continue Psychosocial Services  No Follow up required          Psychosocial Re-Evaluation:  Psychosocial Re-Evaluation     Row Name 11/29/23 0924 12/26/23 1529           Psychosocial Re-Evaluation   Current issues with History of Depression History of Depression      Comments Marvin Chandler continues to deny  any new psychosocial barriers or concerns at this time. Marvin Chandler continues to deny any new psychosocial barriers or concerns.      Expected Outcomes For Marvin Chandler to participate in PR free of any psychosocial barriers or concerns For Marvin Chandler to participate in PR free of any psychosocial barriers or concerns      Interventions Encouraged to attend Pulmonary Rehabilitation for the exercise Encouraged to attend Pulmonary Rehabilitation for the exercise      Continue Psychosocial Services  No Follow up required No Follow up required         Psychosocial Discharge (Final Psychosocial Re-Evaluation):  Psychosocial Re-Evaluation - 12/26/23 1529       Psychosocial Re-Evaluation   Current issues with History of Depression    Comments Marvin Chandler continues to deny any new psychosocial barriers or concerns.    Expected Outcomes For Marvin Chandler to participate in PR free of any psychosocial barriers or concerns    Interventions Encouraged to attend Pulmonary Rehabilitation for the exercise    Continue Psychosocial Services  No Follow up required          Education: Education Goals: Education classes will be provided on a weekly basis, covering required topics. Participant will state understanding/return demonstration of topics presented.  Learning Barriers/Preferences:  Learning Barriers/Preferences - 11/10/23 1009       Learning Barriers/Preferences   Learning Barriers Sight;Reading;Exercise Concerns    Learning Preferences Group Instruction;Individual Instruction;Skilled Demonstration          Education Topics: Know Your Numbers Group instruction that is supported by a PowerPoint presentation. Instructor discusses importance of knowing and understanding resting, exercise, and post-exercise oxygen saturation, heart rate, and blood pressure. Oxygen saturation, heart rate, blood pressure, rating of perceived exertion, and dyspnea are reviewed along with a normal range for these values.    Exercise for  the Pulmonary Patient Group instruction that is supported by a PowerPoint presentation. Instructor discusses benefits of exercise, core components of exercise, frequency, duration, and intensity of an exercise routine, importance of utilizing pulse oximetry during exercise, safety while exercising, and options of places to exercise outside of rehab.    MET Level  Group instruction provided by PowerPoint, verbal discussion, and written material to support subject matter. Instructor reviews what METs are and how to increase METs.  Flowsheet Row PULMONARY REHAB CHRONIC OBSTRUCTIVE PULMONARY DISEASE from 12/14/2023 in [facility] for Heart, Vascular, & Lung Health  Date 12/14/23  Educator EP  Instruction Review Code 1- Verbalizes Understanding    Pulmonary Medications Verbally interactive group education provided by instructor with focus on inhaled medications and proper administration.   Anatomy and Physiology of the  Respiratory System Group instruction provided by PowerPoint, verbal discussion, and written material to support subject matter. Instructor reviews respiratory cycle and anatomical components of the respiratory system and their functions. Instructor also reviews differences in obstructive and restrictive respiratory diseases with examples of each.  Flowsheet Row PULMONARY REHAB CHRONIC OBSTRUCTIVE PULMONARY DISEASE from 12/28/2023 in [facility] for Heart, Vascular, & Lung Health  Date 12/28/23  Educator RT  Instruction Review Code 1- Verbalizes Understanding    Oxygen Safety Group instruction provided by PowerPoint, verbal discussion, and written material to support subject matter. There is an overview of "What is Oxygen" and "Why do we need it".  Instructor also reviews how to create a safe environment for oxygen use, the importance of using oxygen as prescribed, and the risks of noncompliance. There is a brief discussion on traveling  with oxygen and resources the patient may utilize.   Oxygen Use Group instruction provided by PowerPoint, verbal discussion, and written material to discuss how supplemental oxygen is prescribed and different types of oxygen supply systems. Resources for more information are provided.    Breathing Techniques Group instruction that is supported by demonstration and informational handouts. Instructor discusses the benefits of pursed lip and diaphragmatic breathing and detailed demonstration on how to perform both.     Risk Factor Reduction Group instruction that is supported by a PowerPoint presentation. Instructor discusses the definition of a risk factor, different risk factors for pulmonary disease, and how the heart and lungs work together. Flowsheet Row PULMONARY REHAB CHRONIC OBSTRUCTIVE PULMONARY DISEASE from 12/07/2023 in [facility] for Heart, Vascular, & Lung Health  Date 12/07/23  Educator EP  Instruction Review Code 1- Verbalizes Understanding    Pulmonary Diseases Group instruction provided by PowerPoint, verbal discussion, and written material to support subject matter. Instructor gives an overview of the different type of pulmonary diseases. There is also a discussion on risk factors and symptoms as well as ways to manage the diseases. Flowsheet Row PULMONARY REHAB CHRONIC OBSTRUCTIVE PULMONARY DISEASE from 12/21/2023 in [facility] for Heart, Vascular, & Lung Health  Date 12/21/23  Educator RT  Instruction Review Code 1- Verbalizes Understanding    Stress and Energy Conservation Group instruction provided by PowerPoint, verbal discussion, and written material to support subject matter. Instructor gives an overview of stress and the impact it can have on the body. Instructor also reviews ways to reduce stress. There is also a discussion on energy conservation and ways to conserve energy throughout the day. Flowsheet Row PULMONARY  REHAB CHRONIC OBSTRUCTIVE PULMONARY DISEASE from 11/23/2023 in [facility] for Heart, Vascular, & Lung Health  Date 11/23/23  Educator RN  Instruction Review Code 1- Verbalizes Understanding    Warning Signs and Symptoms Group instruction provided by PowerPoint, verbal discussion, and written material to support subject matter. Instructor reviews warning signs and symptoms of stroke, heart attack, cold and flu. Instructor also reviews ways to prevent the spread of infection. Flowsheet Row PULMONARY REHAB CHRONIC OBSTRUCTIVE PULMONARY DISEASE from 11/30/2023 in [facility] for Heart, Vascular, & Lung Health  Date 11/30/23  Educator RN  Instruction Review Code 1- Verbalizes Understanding    Other Education Group or individual verbal, written, or video instructions that support the educational goals of the pulmonary rehab program.    Knowledge Questionnaire Score:  Knowledge Questionnaire Score - 11/10/23 1031       Knowledge Questionnaire Score   Pre Score  13/18          Core Components/Risk Factors/Patient Goals at Admission:  Personal Goals and Risk Factors at Admission - 11/10/23 1007       Core Components/Risk Factors/Patient Goals on Admission    Weight Management Yes;Weight Maintenance    Intervention Weight Management: Develop a combined nutrition and exercise program designed to reach desired caloric intake, while maintaining appropriate intake of nutrient and fiber, sodium and fats, and appropriate energy expenditure required for the weight goal.;Weight Management: Provide education and appropriate resources to help participant work on and attain dietary goals.;Weight Management/Obesity: Establish reasonable short term and long term weight goals.    Admit Weight 217 lb 2.5 oz (98.5 kg)    Expected Outcomes Short Term: Continue to assess and modify interventions until short term weight is achieved;Weight Maintenance:  Understanding of the daily nutrition guidelines, which includes 25-35% calories from fat, 7% or less cal from saturated fats, less than 200mg  cholesterol, less than 1.5gm of sodium, & 5 or more servings of fruits and vegetables daily;Long Term: Adherence to nutrition and physical activity/exercise program aimed toward attainment of established weight goal;Understanding recommendations for meals to include 15-35% energy as protein, 25-35% energy from fat, 35-60% energy from carbohydrates, less than 200mg  of dietary cholesterol, 20-35 gm of total fiber daily;Understanding of distribution of calorie intake throughout the day with the consumption of 4-5 meals/snacks    Improve shortness of breath with ADL's Yes    Intervention Provide education, individualized exercise plan and daily activity instruction to help decrease symptoms of SOB with activities of daily living.    Expected Outcomes Short Term: Improve cardiorespiratory fitness to achieve a reduction of symptoms when performing ADLs;Long Term: Be able to perform more ADLs without symptoms or delay the onset of symptoms          Core Components/Risk Factors/Patient Goals Review:   Goals and Risk Factor Review     Row Name 11/29/23 9072 12/26/23 1529           Core Components/Risk Factors/Patient Goals Review   Personal Goals Review Weight Management/Obesity;Improve shortness of breath with ADL's;Develop more efficient breathing techniques such as purse lipped breathing and diaphragmatic breathing and practicing self-pacing with activity. Weight Management/Obesity;Improve shortness of breath with ADL's;Develop more efficient breathing techniques such as purse lipped breathing and diaphragmatic breathing and practicing self-pacing with activity.      Review Monthly review of patient's Core Components/Risk Factors/Patient Goals are as follows: Goal progressing for maintaining weight. Goal progressing for improving shortness of breath with ADL's.  Marvin Chandler is currently exercising on RA to maintain sats >88%. Goal progressing for developing more efficient breathing techniques such as purse lipped breathing and diaphragmatic breathing; and practicing self-pacing with activity. We will continue to monitor Marvin Chandler's progress throughout the program. Monthly review of patient's Core Components/Risk Factors/Patient Goals are as follows: Goal progressing for maintaining weight. Goal progressing for improving shortness of breath with ADL's. Marvin Chandler is currently exercising on RA to maintain sats >88%. Goal progressing for developing more efficient breathing techniques such as purse lipped breathing and diaphragmatic breathing; and practicing self-pacing with activity. We will continue to monitor Marvin Chandler's progress throughout the program.      Expected Outcomes To improve shortness of breath with ADL's, develop more efficient breathing techniques such as purse lipped breathing and diaphragmatic breathing; and practicing self-pacing with activity and maintain weight. To improve shortness of breath with ADL's, develop more efficient breathing techniques such as purse lipped breathing and diaphragmatic breathing; and  practicing self-pacing with activity and maintain weight.         Core Components/Risk Factors/Patient Goals at Discharge (Final Review):   Goals and Risk Factor Review - 12/26/23 1529       Core Components/Risk Factors/Patient Goals Review   Personal Goals Review Weight Management/Obesity;Improve shortness of breath with ADL's;Develop more efficient breathing techniques such as purse lipped breathing and diaphragmatic breathing and practicing self-pacing with activity.    Review Monthly review of patient's Core Components/Risk Factors/Patient Goals are as follows: Goal progressing for maintaining weight. Goal progressing for improving shortness of breath with ADL's. Marvin Chandler is currently exercising on RA to maintain sats >88%. Goal progressing for  developing more efficient breathing techniques such as purse lipped breathing and diaphragmatic breathing; and practicing self-pacing with activity. We will continue to monitor Marvin Chandler's progress throughout the program.    Expected Outcomes To improve shortness of breath with ADL's, develop more efficient breathing techniques such as purse lipped breathing and diaphragmatic breathing; and practicing self-pacing with activity and maintain weight.          ITP Comments: Pt is making expected progress toward Pulmonary Rehab goals after completing 12 session(s). Recommend continued exercise, life style modification, education, and utilization of breathing techniques to increase stamina and strength, while also decreasing shortness of breath with exertion.  Dr. Slater Staff is Medical Director for Pulmonary Rehab at [facility].

## 2024-01-04 ENCOUNTER — Encounter (HOSPITAL_COMMUNITY)
Admission: RE | Admit: 2024-01-04 | Discharge: 2024-01-04 | Disposition: A | Discharge status: Home / Self Care | Source: Ambulatory Visit | Attending: Pulmonary Disease | Admitting: Pulmonary Disease

## 2024-01-04 DIAGNOSIS — J449 Chronic obstructive pulmonary disease, unspecified: Secondary | ICD-10-CM

## 2024-01-04 NOTE — Progress Notes (Signed)
 Incomplete Pulmonary Rehab Session Note  Patient Details  Name: BEJAMIN HACKBART MRN: [id number] Date of Birth: 1950-09-18 Referring Provider:   [name] Pulmonary Rehab Walk Test from 11/10/2023 in [facility] for Heart, Vascular, & Lung Health  Referring Provider [name]    Nilton A Branscum did not complete his rehab session.  [name] c/o dizziness while exercising. BP 82/58 manual. Pt instructed to sit and drink water. Pt drank ~60oz of water, recheck 20 minutes later, 96/60. Pt feeling better, waiting for wife to pick him up. Pt called staff over c/o dizziness again. BP 88/58. Pt placed in trendelenburg, drank more water. Pt stated he has been having diarrhea, mowed lawn yesterday but forgot his water. Also, stated he doesn't drink much water daily. Spoke to provider on site, Barnie Hila, NP. Pt recheck of BP 30 minutes later 102/60, pt denies dizziness. Parameters received for BP meds from NP, BP log provided to pt and instructions of when/how to take home BP. Pt understands without assistance. Pt wheeled down to wife who picked him up. No complaints on way out.

## 2024-01-09 ENCOUNTER — Encounter (HOSPITAL_COMMUNITY)
Admission: RE | Admit: 2024-01-09 | Discharge: 2024-01-09 | Disposition: A | Discharge status: Home / Self Care | Source: Ambulatory Visit | Attending: Pulmonary Disease | Admitting: Pulmonary Disease

## 2024-01-09 DIAGNOSIS — J449 Chronic obstructive pulmonary disease, unspecified: Secondary | ICD-10-CM | POA: Diagnosis not present

## 2024-01-09 NOTE — Progress Notes (Signed)
 Daily Session Note  Patient Details  Name: Marvin Chandler MRN: [id number] Date of Birth: Nov 17, 1950 Referring Provider:   [name] Pulmonary Rehab Walk Test from 11/10/2023 in [facility] for Heart, Vascular, & Lung Health  Referring Provider [name]    Encounter Date: 01/09/2024  Check In:  Session Check In - 01/09/24 1025       Check-In   Supervising physician immediately available to respond to emergencies [facility] MD immediately available    Physician(s) Josefa Beauvais, NP    Location [facility]    Staff Present Johnnie Moats, MS, ACSM-CEP, Exercise Physiologist;Consuela Widener Claudene Candia Levin, RN, BSN;Randi Reeve BS, ACSM-CEP, Exercise Physiologist    Virtual Visit No    Medication changes reported     No    Fall or balance concerns reported    No    Tobacco Cessation No Change    Warm-up and Cool-down Performed as group-led instruction    Resistance Training Performed Yes    VAD Patient? No    PAD/SET Patient? No      Pain Assessment   Currently in Pain? No/denies    Multiple Pain Sites No          Capillary Blood Glucose: No results found for this or any previous visit (from the past 24 hours).    Social History   Tobacco Use  Smoking Status Former   Current packs/day: 0.00   Average packs/day: 1 pack/day for 7.0 years (7.0 ttl pk-yrs)   Types: Cigarettes   Start date: 11/06/1990   Quit date: 11/05/1997   Years since quitting: 26.1  Smokeless Tobacco Never    Goals Met:  Proper associated with RPD/PD & O2 Sat Independence with exercise equipment Exercise tolerated well No report of concerns or symptoms today Strength training completed today  Goals Unmet:  Not Applicable  Comments: Service time is from 1014 to 1143.    Dr. Slater Staff is Medical Director for Pulmonary Rehab at [facility].

## 2024-01-11 ENCOUNTER — Encounter (HOSPITAL_COMMUNITY)
Admission: RE | Admit: 2024-01-11 | Discharge: 2024-01-11 | Disposition: A | Discharge status: Home / Self Care | Source: Ambulatory Visit | Attending: Pulmonary Disease | Admitting: Pulmonary Disease

## 2024-01-11 DIAGNOSIS — J449 Chronic obstructive pulmonary disease, unspecified: Secondary | ICD-10-CM | POA: Diagnosis not present

## 2024-01-11 NOTE — Progress Notes (Signed)
 Daily Session Note  Patient Details  Name: Marvin Chandler MRN: [id number] Date of Birth: 1950/07/26 Referring Provider:   [name] Pulmonary Rehab Walk Test from 11/10/2023 in [facility] for Heart, Vascular, & Lung Health  Referring Provider [name]    Encounter Date: 01/11/2024  Check In:  Session Check In - 01/11/24 1026       Check-In   Supervising physician immediately available to respond to emergencies [facility] MD immediately available    Physician(s) Orren Fabry, PA    Location [facility]    Staff Present Johnnie Moats, MS, ACSM-CEP, Exercise Physiologist;Casey Claudene Candia Levin, RN, BSN;Sonia Stickels BS, ACSM-CEP, Exercise Physiologist    Virtual Visit No    Medication changes reported     No    Fall or balance concerns reported    No    Tobacco Cessation No Change    Warm-up and Cool-down Performed as group-led instruction    Resistance Training Performed Yes    VAD Patient? No    PAD/SET Patient? No      Pain Assessment   Currently in Pain? No/denies          Capillary Blood Glucose: No results found for this or any previous visit (from the past 24 hours).    Social History   Tobacco Use  Smoking Status Former   Current packs/day: 0.00   Average packs/day: 1 pack/day for 7.0 years (7.0 ttl pk-yrs)   Types: Cigarettes   Start date: 11/06/1990   Quit date: 11/05/1997   Years since quitting: 26.2  Smokeless Tobacco Never    Goals Met:  Independence with exercise equipment Exercise tolerated well No report of concerns or symptoms today Strength training completed today  Goals Unmet:  Not Applicable  Comments: Service time is from 1015 to 1146.    Dr. Slater Staff is Medical Director for Pulmonary Rehab at [facility].

## 2024-01-16 ENCOUNTER — Encounter (HOSPITAL_COMMUNITY)

## 2024-01-18 ENCOUNTER — Encounter (HOSPITAL_COMMUNITY)
Admission: RE | Admit: 2024-01-18 | Discharge: 2024-01-18 | Disposition: A | Discharge status: Home / Self Care | Source: Ambulatory Visit | Attending: Pulmonary Disease | Admitting: Pulmonary Disease

## 2024-01-18 DIAGNOSIS — J449 Chronic obstructive pulmonary disease, unspecified: Secondary | ICD-10-CM

## 2024-01-18 NOTE — Progress Notes (Signed)
 Pulmonary Rehab Incomplete Session Note  Patient Details  Name: Marvin Chandler MRN: [id number] Date of Birth: [birth year] Referring Provider:   [name] Pulmonary Rehab Walk Test from 11/10/2023 in [facility] for Heart, Vascular, & Lung Health  Referring Provider [name]    Dartanyan A Valletta did not complete his rehab session. [name] stated that he didn't feel good after getting VS and didn't exercise today. BP 92/58, HR 84, 99% RA. Instructed pt to drink water to hydrate. Pt felt better after resting and hydrating.

## 2024-01-23 ENCOUNTER — Encounter (HOSPITAL_COMMUNITY)
Admission: RE | Admit: 2024-01-23 | Discharge: 2024-01-23 | Disposition: A | Discharge status: Home / Self Care | Source: Ambulatory Visit | Attending: Pulmonary Disease | Admitting: Pulmonary Disease

## 2024-01-23 DIAGNOSIS — J449 Chronic obstructive pulmonary disease, unspecified: Secondary | ICD-10-CM | POA: Diagnosis not present

## 2024-01-23 NOTE — Progress Notes (Signed)
 Daily Session Note  Patient Details  Name: Marvin Chandler MRN: [id number] Date of Birth: 1950-10-26 Referring Provider:   [name] Pulmonary Rehab Walk Test from 11/10/2023 in [facility]  Referring Provider [name]    Encounter Date: 01/23/2024  Check In:  Session Check In - 01/23/24 1027       Check-In   Supervising physician immediately available to respond to emergencies [facility] MD immediately available    Physician(s) Damien Braver, NP    Location [facility]    Staff Present Johnnie Moats, MS, ACSM-CEP, Exercise Physiologist;Casey Claudene Candia Levin, RN, BSN;Vernell Back BS, ACSM-CEP, Exercise Physiologist    Virtual Visit No    Medication changes reported     No    Tobacco Cessation No Change    Warm-up and Cool-down Performed as group-led instruction    Resistance Training Performed Yes    VAD Patient? No    PAD/SET Patient? No      Pain Assessment   Currently in Pain? No/denies          Capillary Blood Glucose: No results found for this or any previous visit (from the past 24 hours).    Social History   Tobacco Use  Smoking Status Former   Current packs/day: 0.00   Average packs/day: 1 pack/day for 7.0 years (7.0 ttl pk-yrs)   Types: Cigarettes   Start date: 11/06/1990   Quit date: 11/05/1997   Years since quitting: 26.2  Smokeless Tobacco Never    Goals Met:  Independence with exercise equipment Exercise tolerated well No report of concerns or symptoms today Strength training completed today  Goals Unmet:  Not Applicable  Comments: Service time is from 1018 to 1132.    Dr. Slater Staff is Medical Director for Pulmonary Rehab at [facility].

## 2024-01-25 ENCOUNTER — Encounter (HOSPITAL_COMMUNITY)
Admission: RE | Admit: 2024-01-25 | Discharge: 2024-01-25 | Disposition: A | Discharge status: Home / Self Care | Source: Ambulatory Visit | Attending: Pulmonary Disease | Admitting: Pulmonary Disease

## 2024-01-25 DIAGNOSIS — J449 Chronic obstructive pulmonary disease, unspecified: Secondary | ICD-10-CM

## 2024-01-25 NOTE — Progress Notes (Signed)
 Daily Session Note  Patient Details  Name: Marvin Chandler MRN: [id number] Date of Birth: 25-Sep-1950 Referring Provider:   [name] Pulmonary Rehab Walk Test from 11/10/2023 in [facility]  Referring Provider [name]    Encounter Date: 01/25/2024  Check In:  Session Check In - 01/25/24 1030       Check-In   Supervising physician immediately available to respond to emergencies [facility] MD immediately available    Physician(s) Lum Louis, NP    [facility]    Staff Present Augustin Sharps, Candia Levin, RN, BSN;Waniya Hoglund BS, ACSM-CEP, Exercise Physiologist;David Gillette, MS, ACSM-CEP, CCRP, Exercise Physiologist    Virtual Visit No    Medication changes reported     No    Tobacco Cessation No Change    Warm-up and Cool-down Performed as group-led instruction    Resistance Training Performed Yes    VAD Patient? No    PAD/SET Patient? No      Pain Assessment   Currently in Pain? No/denies    Multiple Pain Sites No          Capillary Blood Glucose: No results found for this or any previous visit (from the past 24 hours).    Social History   Tobacco Use  Smoking Status Former   Current packs/day: 0.00   Average packs/day: 1 pack/day for 7.0 years (7.0 ttl pk-yrs)   Types: Cigarettes   Start date: 11/06/1990   Quit date: 11/05/1997   Years since quitting: 26.2  Smokeless Tobacco Never    Goals Met:  Independence with exercise equipment Exercise tolerated well No report of concerns or symptoms today Strength training completed today  Goals Unmet:  Not Applicable  Comments: Service time is from 1019 to 1148.    Dr. Slater Staff is Medical Director for Pulmonary Rehab at [facility].

## 2024-01-30 ENCOUNTER — Encounter (HOSPITAL_COMMUNITY)
Admission: RE | Admit: 2024-01-30 | Discharge: 2024-01-30 | Disposition: A | Discharge status: Home / Self Care | Source: Ambulatory Visit | Attending: Pulmonary Disease | Admitting: Pulmonary Disease

## 2024-01-30 VITALS — Wt 222.4 lb

## 2024-01-30 DIAGNOSIS — J449 Chronic obstructive pulmonary disease, unspecified: Secondary | ICD-10-CM | POA: Diagnosis not present

## 2024-01-30 NOTE — Progress Notes (Signed)
 Daily Session Note  Patient Details  Name: Marvin Chandler MRN: [id number] Date of Birth: [birth year] Referring Provider:   [name] Pulmonary Rehab Walk Test from 11/10/2023 in [facility]  Referring Provider [name]    Encounter Date: 01/30/2024  Check In:  Session Check In - 01/30/24 1104       Check-In   Supervising physician immediately available to respond to emergencies [facility] MD immediately available    Physician(s) Rosaline Skains, NP    [facility]    Staff Present Augustin Sharps, Candia Levin, RN, BSN;Carlette Carlton, RN, Maud Moats, MS, ACSM-CEP, Exercise Physiologist    Virtual Visit No    Medication changes reported     No    Tobacco Cessation No Change    Warm-up and Cool-down Performed as group-led instruction    Resistance Training Performed Yes    VAD Patient? No    PAD/SET Patient? No      Pain Assessment   Currently in Pain? No/denies    Multiple Pain Sites No          Capillary Blood Glucose: No results found for this or any previous visit (from the past 24 hours).   Exercise Prescription Changes - 01/30/24 1200       Response to Exercise   Blood Pressure (Admit) 102/64    Blood Pressure (Exercise) 108/64    Blood Pressure (Exit) 100/60    Heart Rate (Admit) 84 bpm    Heart Rate (Exercise) 95 bpm    Heart Rate (Exit) 77 bpm    Oxygen Saturation (Admit) 98 %    Oxygen Saturation (Exercise) 98 %    Oxygen Saturation (Exit) 97 %    Rating of Perceived Exertion (Exercise) 13    Perceived Dyspnea (Exercise) 1    Duration Continue with 30 min of aerobic exercise without signs/symptoms of physical distress.    Intensity THRR unchanged      Progression   Progression Continue to progress workloads to maintain intensity without signs/symptoms of physical distress.      Resistance Training   Training Prescription Yes    Weight black bands    Reps 10-15     Time 10 Minutes      Interval Training   Interval Training No      Recumbant Bike   Level 4    RPM 60    Watts 57    Minutes 15    METs 3.1      NuStep   Level 4    SPM 98    Minutes 15    METs 3.1          Social History   Tobacco Use  Smoking Status Former   Current packs/day: 0.00   Average packs/day: 1 pack/day for 7.0 years (7.0 ttl pk-yrs)   Types: Cigarettes   Start date: 11/06/1990   Quit date: 11/05/1997   Years since quitting: 26.2  Smokeless Tobacco Never    Goals Met:  Independence with exercise equipment Exercise tolerated well No report of concerns or symptoms today Strength training completed today  Goals Unmet:  Not Applicable  Comments: Service time is from 1025 to 1135    Dr. Slater Staff is Medical Director for Pulmonary Rehab at [facility].

## 2024-01-31 NOTE — Progress Notes (Signed)
 Pulmonary Individual Treatment Plan  Patient Details  Name: Marvin Chandler MRN: [id number] Date of Birth: Nov 18, 1950 Referring Provider:   [name] Pulmonary Rehab Walk Test from 11/10/2023 in [facility]  Referring Provider [name]    Initial Encounter Date:  Flowsheet Row Pulmonary Rehab Walk Test from 11/10/2023 in [facility]  Date 11/10/23    Visit Diagnosis: Stage 2 moderate COPD by GOLD classification (HCC)  Patient's Home Medications on Admission:   Current Outpatient Medications:    albuterol  (PROVENTIL ) (2.5 MG/3ML) 0.083% nebulizer solution, Take 3 mLs (2.5 mg total) by nebulization every 6 (six) hours as needed for wheezing or shortness of breath., Disp: 300 mL, Rfl: 5   albuterol  (VENTOLIN  HFA) 108 (90 Base) MCG/ACT inhaler, Inhale 2 puffs into the lungs every 4 (four) hours as needed., Disp: 18 g, Rfl: 6   atorvastatin  (LIPITOR ) 80 MG tablet, Take 80 mg by mouth at bedtime., Disp: , Rfl:    empagliflozin (JARDIANCE) 25 MG TABS tablet, Take 12.5 mg by mouth daily. Take one half tablet daily, Disp: , Rfl:    EPINEPHrine  0.3 mg/0.3 mL IJ SOAJ injection, Inject 0.3 mLs (0.3 mg total) into the muscle once., Disp: 1 Device, Rfl: 11   fluticasone  (FLONASE ) 50 MCG/ACT nasal spray, Place 2 sprays into both nostrils daily., Disp: 16 g, Rfl: 5   fluticasone -salmeterol (ADVAIR) 250-50 MCG/ACT AEPB, Inhale 1 puff into the lungs daily., Disp: , Rfl:    furosemide  (LASIX ) 40 MG tablet, Take 40 mg by mouth 2 (two) times daily with a meal., Disp: , Rfl:    gabapentin (NEURONTIN) 300 MG capsule, Take 300 mg by mouth at bedtime., Disp: , Rfl:    isosorbide  mononitrate (IMDUR ) 30 MG 24 hr tablet, Take 1 tablet (30 mg total) by mouth daily., Disp: 30 tablet, Rfl: 2   loratadine  (CLARITIN ) 10 MG tablet, Take 10 mg by mouth daily., Disp: , Rfl:    metFORMIN (GLUCOPHAGE) 500  MG tablet, Take 500 mg by mouth daily with breakfast., Disp: , Rfl:    montelukast  (SINGULAIR ) 10 MG tablet, TAKE 1 TABLET BY MOUTH AT BEDTIME, Disp: 30 tablet, Rfl: 30   nitroGLYCERIN  (NITROSTAT ) 0.4 MG SL tablet, Place 1 tablet (0.4 mg total) under the tongue every 5 (five) minutes as needed., Disp: 25 tablet, Rfl: 3   omeprazole  (PRILOSEC) 20 MG capsule, Take 40 mg by mouth daily., Disp: , Rfl:    polyvinyl alcohol (LIQUIFILM TEARS) 1.4 % ophthalmic solution, Place 1 drop into both eyes 4 (four) times daily as needed for dry eyes., Disp: , Rfl:    rivaroxaban  (XARELTO ) 20 MG TABS tablet, Take 1 tablet (20 mg total) by mouth daily with supper., Disp: 30 tablet, Rfl: 6   sacubitril -valsartan  (ENTRESTO ) 97-103 MG, Take 1 tablet by mouth 2 (two) times daily., Disp: 60 tablet, Rfl: 11   spironolactone  (ALDACTONE ) 25 MG tablet, Take 0.5 tablets (12.5 mg total) by mouth daily., Disp: 15 tablet, Rfl: 2   terbinafine (LAMISIL) 1 % cream, Apply 1 application topically daily as needed (athelete's foot and nail fungus)., Disp: , Rfl:    terbinafine (LAMISIL) 250 MG tablet, Take 250 mg by mouth daily., Disp: , Rfl:   Past Medical History: Past Medical History:  Diagnosis Date   Arthritis of hip    bilateral   Asthma    CAD (coronary artery disease)    LHC 5/16:  Dx ostial 30%, LAD luminal irregs, EF 40%   Chronic atrial fibrillation (HCC) 07/29/2019   Chronic systolic CHF (congestive heart failure) (HCC)    a. Echo 3/16:  inf and inf-lat HK, mild LVH, EF 45%, mild to mod LAE, normal RVF   Cough    Hiatal hernia    NICM (nonischemic cardiomyopathy) (HCC)    Osteoarthritis    Personal history of colonic adenomas 02/20/2013   Pneumonia     Tobacco Use: Social History   Tobacco Use  Smoking Status Former   Current packs/day: 0.00   Average packs/day: 1 pack/day for 7.0 years (7.0 ttl pk-yrs)   Types: Cigarettes   Start date: 11/06/1990   Quit date: 11/05/1997   Years since quitting: 26.2   Smokeless Tobacco Never    Labs: Review Flowsheet  More data exists      Latest Ref Rng & Units 06/23/2016 09/12/2016 11/09/2017 06/14/2019 09/25/2020  Labs for ITP Cardiac and Pulmonary Rehab  Cholestrol 100 - 199 mg/dL 847  847  877  75  887   LDL (calc) 0 - 99 mg/dL 89  86  71  32  59   HDL-C >39 mg/dL 37  40  32  30  39   Trlycerides 0 - 149 mg/dL 869  867  96  52  63   Hemoglobin A1c 4.8 - 5.6 % - - 6.5  - -    Capillary Blood Glucose: Lab Results  Component Value Date   GLUCAP 83 11/23/2023   GLUCAP 152 (H) 11/23/2023   GLUCAP 96 11/21/2023   GLUCAP 165 (H) 11/21/2023   GLUCAP 121 (H) 11/10/2023    POCT Glucose     Row Name 11/10/23 0959             POCT Blood Glucose   Pre-Exercise 121 mg/dL          Pulmonary Assessment Scores:  Pulmonary Assessment Scores     Row Name 11/10/23 1032         ADL UCSD   ADL Phase Entry     SOB Score total 82       CAT Score   CAT Score 25       mMRC Score   mMRC Score 4       UCSD: Self-administered rating of dyspnea associated with activities of daily living (ADLs) 6-point scale (0 = not at all to 5 = maximal or unable to do because of breathlessness)  Scoring Scores range from 0 to 120.  Minimally important difference is 5 units  CAT: CAT can identify the health impairment of COPD patients and is better correlated with disease progression.  CAT has a scoring range of zero to 40. The CAT score is classified into four groups of low (less than 10), medium (10 - 20), high (21-30) and very high (31-40) based on the impact level of disease on health status. A CAT score over 10 suggests significant symptoms.  A worsening CAT score could be explained by an exacerbation, poor medication adherence, poor inhaler technique, or progression of COPD or comorbid conditions.  CAT MCID is 2 points  mMRC: mMRC (Modified Medical Research Council) Dyspnea Scale is used to assess the degree of baseline functional disability in  patients of respiratory disease due to dyspnea. No minimal important difference is established. A decrease in score of 1 point or greater is considered a positive change.   Pulmonary Function Assessment:  Pulmonary Function Assessment - 11/10/23 1003  Breath   Bilateral Breath Sounds Clear    Shortness of Breath Fear of Shortness of Breath;Limiting activity          Exercise Target Goals: Exercise Program Goal: Individual exercise prescription set using results from initial 6 min walk test and THRR while considering  patient's activity barriers and safety.   Exercise Prescription Goal: Initial exercise prescription builds to 30-45 minutes a day of aerobic activity, 2-3 days per week.  Home exercise guidelines will be given to patient during program as part of exercise prescription that the participant will acknowledge.  Activity Barriers & Risk Stratification:  Activity Barriers & Cardiac Risk Stratification - 11/10/23 0959       Activity Barriers & Cardiac Risk Stratification   Activity Barriers Muscular Weakness;Shortness of Breath;Deconditioning;Arthritis;Left Hip Replacement;Right Hip Replacement    Cardiac Risk Stratification Moderate          6 Minute Walk:  6 Minute Walk     Row Name 11/10/23 1038         6 Minute Walk   Phase Initial     Distance 1037 feet     Walk Time 6 minutes     # of Rest Breaks 0     MPH 1.96     METS 1.97     RPE 11     Perceived Dyspnea  1     VO2 Peak 6.89     Symptoms No     Resting HR 81 bpm     Resting BP 92/60     Resting Oxygen Saturation  98 %     Exercise Oxygen Saturation  during 6 min walk 92 %     Max Ex. HR 100 bpm     Max Ex. BP 112/50     2 Minute Post BP 94/50       Interval HR   1 Minute HR 96     2 Minute HR 99     3 Minute HR 100     4 Minute HR 98     5 Minute HR 100     6 Minute HR 99     2 Minute Post HR 84     Interval Heart Rate? Yes       Interval Oxygen   Interval Oxygen? Yes      Baseline Oxygen Saturation % 98 %     1 Minute Oxygen Saturation % 96 %     1 Minute Liters of Oxygen 0 L     2 Minute Oxygen Saturation % 98 %     2 Minute Liters of Oxygen 0 L     3 Minute Oxygen Saturation % 94 %     3 Minute Liters of Oxygen 0 L     4 Minute Oxygen Saturation % 95 %     4 Minute Liters of Oxygen 0 L     5 Minute Oxygen Saturation % 92 %     5 Minute Liters of Oxygen 0 L     6 Minute Oxygen Saturation % 91 %     6 Minute Liters of Oxygen 0 L     2 Minute Post Oxygen Saturation % 100 %     2 Minute Post Liters of Oxygen 0 L        Oxygen Initial Assessment:  Oxygen Initial Assessment - 11/10/23 1000       Home Oxygen   Home Oxygen Device None    Sleep Oxygen Prescription  CPAP   not compliant   Home Exercise Oxygen Prescription None    Home Resting Oxygen Prescription None      Initial 6 min Walk   Oxygen Used None      Program Oxygen Prescription   Program Oxygen Prescription None      Intervention   Short Term Goals To learn and understand importance of maintaining oxygen saturations>88%;To learn and demonstrate proper use of respiratory medications;To learn and understand importance of monitoring SPO2 with pulse oximeter and demonstrate accurate use of the pulse oximeter.;To learn and demonstrate proper pursed lip breathing techniques or other breathing techniques.     Long  Term Goals Maintenance of O2 saturations>88%;Compliance with respiratory medication;Verbalizes importance of monitoring SPO2 with pulse oximeter and return demonstration;Exhibits proper breathing techniques, such as pursed lip breathing or other method taught during program session;Demonstrates proper use of MDI's          Oxygen Re-Evaluation:  Oxygen Re-Evaluation     Row Name 11/29/23 0841 12/26/23 1630 01/19/24 1114         Program Oxygen Prescription   Program Oxygen Prescription None None None       Home Oxygen   Home Oxygen Device None None None     Sleep Oxygen  Prescription CPAP  not compliant CPAP  not compliant CPAP  not compliant     Home Exercise Oxygen Prescription None None None     Home Resting Oxygen Prescription None None None       Goals/Expected Outcomes   Short Term Goals To learn and understand importance of maintaining oxygen saturations>88%;To learn and demonstrate proper use of respiratory medications;To learn and understand importance of monitoring SPO2 with pulse oximeter and demonstrate accurate use of the pulse oximeter.;To learn and demonstrate proper pursed lip breathing techniques or other breathing techniques.  To learn and understand importance of maintaining oxygen saturations>88%;To learn and demonstrate proper use of respiratory medications;To learn and understand importance of monitoring SPO2 with pulse oximeter and demonstrate accurate use of the pulse oximeter.;To learn and demonstrate proper pursed lip breathing techniques or other breathing techniques.  To learn and understand importance of maintaining oxygen saturations>88%;To learn and demonstrate proper use of respiratory medications;To learn and understand importance of monitoring SPO2 with pulse oximeter and demonstrate accurate use of the pulse oximeter.;To learn and demonstrate proper pursed lip breathing techniques or other breathing techniques.      Long  Term Goals Maintenance of O2 saturations>88%;Compliance with respiratory medication;Verbalizes importance of monitoring SPO2 with pulse oximeter and return demonstration;Exhibits proper breathing techniques, such as pursed lip breathing or other method taught during program session;Demonstrates proper use of MDI's Maintenance of O2 saturations>88%;Compliance with respiratory medication;Verbalizes importance of monitoring SPO2 with pulse oximeter and return demonstration;Exhibits proper breathing techniques, such as pursed lip breathing or other method taught during program session;Demonstrates proper use of MDI's Maintenance  of O2 saturations>88%;Compliance with respiratory medication;Verbalizes importance of monitoring SPO2 with pulse oximeter and return demonstration;Exhibits proper breathing techniques, such as pursed lip breathing or other method taught during program session;Demonstrates proper use of MDI's     Goals/Expected Outcomes Compliance and understanding of oxygen saturation monitoring and breathing techniques to decrease shortness of breath. Compliance and understanding of oxygen saturation monitoring and breathing techniques to decrease shortness of breath. Compliance and understanding of oxygen saturation monitoring and breathing techniques to decrease shortness of breath.        Oxygen Discharge (Final Oxygen Re-Evaluation):  Oxygen Re-Evaluation - 01/19/24 1114  Program Oxygen Prescription   Program Oxygen Prescription None      Home Oxygen   Home Oxygen Device None    Sleep Oxygen Prescription CPAP   not compliant   Home Exercise Oxygen Prescription None    Home Resting Oxygen Prescription None      Goals/Expected Outcomes   Short Term Goals To learn and understand importance of maintaining oxygen saturations>88%;To learn and demonstrate proper use of respiratory medications;To learn and understand importance of monitoring SPO2 with pulse oximeter and demonstrate accurate use of the pulse oximeter.;To learn and demonstrate proper pursed lip breathing techniques or other breathing techniques.     Long  Term Goals Maintenance of O2 saturations>88%;Compliance with respiratory medication;Verbalizes importance of monitoring SPO2 with pulse oximeter and return demonstration;Exhibits proper breathing techniques, such as pursed lip breathing or other method taught during program session;Demonstrates proper use of MDI's    Goals/Expected Outcomes Compliance and understanding of oxygen saturation monitoring and breathing techniques to decrease shortness of breath.          Initial Exercise  Prescription:  Initial Exercise Prescription - 11/10/23 1000       Date of Initial Exercise RX and Referring Provider   Date 11/10/23    Referring Provider [name]    Expected Discharge Date 02/08/24      Recumbant Bike   Level 1    RPM 25    Watts 40    Minutes 15    METs 2      NuStep   Level 1    SPM 70    Minutes 15    METs 2.5      Prescription Details   Frequency (times per week) 2    Duration Progress to 30 minutes of continuous aerobic without signs/symptoms of physical distress      Intensity   THRR 40-80% of Max Heartrate 58-117    Ratings of Perceived Exertion 11-13    Perceived Dyspnea 0-4      Progression   Progression Continue to progress workloads to maintain intensity without signs/symptoms of physical distress.      Resistance Training   Training Prescription Yes    Weight blue bands    Reps 10-15          Perform Capillary Blood Glucose checks as needed.  Exercise Prescription Changes:   Exercise Prescription Changes     Row Name 11/21/23 1100 12/05/23 1100 12/19/23 1200 01/02/24 1100 01/30/24 1200     Response to Exercise   Blood Pressure (Admit) 98/58 108/60 110/62 110/66 102/64   Blood Pressure (Exercise) 96/56 114/60 110/66 120/60 108/64   Blood Pressure (Exit) 98/70 112/60 104/62 96/58 100/60   Heart Rate (Admit) 91 bpm 87 bpm 97 bpm 86 bpm 84 bpm   Heart Rate (Exercise) 83 bpm 100 bpm 103 bpm 113 bpm 95 bpm   Heart Rate (Exit) 86 bpm 98 bpm 97 bpm 93 bpm 77 bpm   Oxygen Saturation (Admit) 96 % 98 % 99 % 97 % 98 %   Oxygen Saturation (Exercise) 98 % 96 % 98 % 97 % 98 %   Oxygen Saturation (Exit) 97 % 98 % 96 % 97 % 97 %   Rating of Perceived Exertion (Exercise) 13 15 13 13 13    Perceived Dyspnea (Exercise) 3 1 1  0 1   Duration Progress to 30 minutes of  aerobic without signs/symptoms of physical distress Progress to 30 minutes of  aerobic without signs/symptoms of physical distress Progress to 30  minutes of  aerobic without  signs/symptoms of physical distress Continue with 30 min of aerobic exercise without signs/symptoms of physical distress. Continue with 30 min of aerobic exercise without signs/symptoms of physical distress.   Intensity THRR unchanged THRR unchanged THRR unchanged THRR unchanged THRR unchanged     Progression   Progression Continue to progress workloads to maintain intensity without signs/symptoms of physical distress. Continue to progress workloads to maintain intensity without signs/symptoms of physical distress. Continue to progress workloads to maintain intensity without signs/symptoms of physical distress. Continue to progress workloads to maintain intensity without signs/symptoms of physical distress. Continue to progress workloads to maintain intensity without signs/symptoms of physical distress.     Resistance Training   Training Prescription Yes Yes Yes Yes Yes   Weight blue bands blue bands blue bands blue bands black bands   Reps 10-15 10-15 10-15 10-15 10-15   Time 10 Minutes 10 Minutes 10 Minutes 10 Minutes 10 Minutes     Interval Training   Interval Training -- -- -- No No     Recumbant Bike   Level 1 3 3 3 4    RPM -- 101 -- 66 60   Watts -- 67 89 36 57   Minutes 15 15 15 15 15    METs 1.9 2.9 3.2 2.9 3.1     NuStep   Level 1 2 3 3 4    SPM 82 67 85 101 98   Minutes 15 15 15 15 15    METs 1.6 2.9 2.6 3.1 3.1      Exercise Comments:   Exercise Goals and Review:   Exercise Goals     Row Name 11/10/23 1000             Exercise Goals   Increase Physical Activity Yes       Intervention Provide advice, education, support and counseling about physical activity/exercise needs.;Develop an individualized exercise prescription for aerobic and resistive training based on initial evaluation findings, risk stratification, comorbidities and participant's personal goals.       Expected Outcomes Short Term: Attend rehab on a regular basis to increase amount of physical  activity.;Long Term: Exercising regularly at least 3-5 days a week.;Long Term: Add in home exercise to make exercise part of routine and to increase amount of physical activity.       Increase Strength and Stamina Yes       Intervention Provide advice, education, support and counseling about physical activity/exercise needs.;Develop an individualized exercise prescription for aerobic and resistive training based on initial evaluation findings, risk stratification, comorbidities and participant's personal goals.       Expected Outcomes Short Term: Increase workloads from initial exercise prescription for resistance, speed, and METs.;Short Term: Perform resistance training exercises routinely during rehab and add in resistance training at home;Long Term: Improve cardiorespiratory fitness, muscular endurance and strength as measured by increased METs and functional capacity ( )       Able to understand and use rate of perceived exertion (RPE) scale Yes       Intervention Provide education and explanation on how to use RPE scale       Expected Outcomes Short Term: Able to use RPE daily in rehab to express subjective intensity level;Long Term:  Able to use RPE to guide intensity level when exercising independently       Able to understand and use Dyspnea scale Yes       Intervention Provide education and explanation on how to use Dyspnea scale  Expected Outcomes Short Term: Able to use Dyspnea scale daily in rehab to express subjective sense of shortness of breath during exertion;Long Term: Able to use Dyspnea scale to guide intensity level when exercising independently       Knowledge and understanding of Target Heart Rate Range (THRR) Yes       Intervention Provide education and explanation of THRR including how the numbers were predicted and where they are located for reference       Expected Outcomes Short Term: Able to state/look up THRR;Long Term: Able to use THRR to govern intensity when  exercising independently;Short Term: Able to use daily as guideline for intensity in rehab       Understanding of Exercise Prescription Yes       Intervention Provide education, explanation, and written materials on patient's individual exercise prescription       Expected Outcomes Short Term: Able to explain program exercise prescription;Long Term: Able to explain home exercise prescription to exercise independently          Exercise Goals Re-Evaluation :  Exercise Goals Re-Evaluation     Row Name 11/29/23 9161 12/26/23 1629 01/19/24 1112         Exercise Goal Re-Evaluation   Exercise Goals Review Increase Physical Activity;Able to understand and use Dyspnea scale;Understanding of Exercise Prescription;Increase Strength and Stamina;Knowledge and understanding of Target Heart Rate Range (THRR);Able to understand and use rate of perceived exertion (RPE) scale Increase Physical Activity;Able to understand and use Dyspnea scale;Understanding of Exercise Prescription;Increase Strength and Stamina;Knowledge and understanding of Target Heart Rate Range (THRR);Able to understand and use rate of perceived exertion (RPE) scale Increase Physical Activity;Able to understand and use Dyspnea scale;Understanding of Exercise Prescription;Increase Strength and Stamina;Knowledge and understanding of Target Heart Rate Range (THRR);Able to understand and use rate of perceived exertion (RPE) scale     Comments Gladys has completed 3 exercise sessions. He exercises for 15 min on the Nustep and recumbent bike. He averages 2.3 METs at level 2 on the Nustep and 2.7 METs at level 2 on the recumbent bike. He performs the warmup and cooldown standing without limitations. It is too soon to notate any discernable progressions at this time. Will continue to monitor and progress as able. Marvin Chandler has completed 10 exercise sessions. He exercises for 15 min on the Nustep and recumbent bike. He averages 2.5 METs at level 3 on the  Nustep and 3.1 METs at level 3 on the recumbent bike. He performs the warmup and cooldown standing without limitations. Gladys has increased his level on the Nustep and recumbent bike as METs have increased. He tolerates progressions well. Will continue to monitor and progress as able. Gladys has completed 15 exercise sessions. He exercises for 15 min on the Nustep and recumbent bike. He averages 3.2 METs at level 4 on the Nustep and 3.5 METs at level 4 on the recumbent bike. He performs the warmup and cooldown standing without limitations. Marvin Chandler continue to increase his level on the Nustep and recumbent bike. METs have greatly increased on the Nustep. He has had some limitation due to not feeling well enough to exercise. Will continue to monitor and progress as able.     Expected Outcomes Through exercise at rehab and home, the patient will decrease shortness of breath with daily activities and feel confident in carrying out an exercise regimen at home. Through exercise at rehab and home, the patient will decrease shortness of breath with daily activities and feel confident in carrying out  an exercise regimen at home. Through exercise at rehab and home, the patient will decrease shortness of breath with daily activities and feel confident in carrying out an exercise regimen at home.        Discharge Exercise Prescription (Final Exercise Prescription Changes):  Exercise Prescription Changes - 01/30/24 1200       Response to Exercise   Blood Pressure (Admit) 102/64    Blood Pressure (Exercise) 108/64    Blood Pressure (Exit) 100/60    Heart Rate (Admit) 84 bpm    Heart Rate (Exercise) 95 bpm    Heart Rate (Exit) 77 bpm    Oxygen Saturation (Admit) 98 %    Oxygen Saturation (Exercise) 98 %    Oxygen Saturation (Exit) 97 %    Rating of Perceived Exertion (Exercise) 13    Perceived Dyspnea (Exercise) 1    Duration Continue with 30 min of aerobic exercise without signs/symptoms of physical distress.     Intensity THRR unchanged      Progression   Progression Continue to progress workloads to maintain intensity without signs/symptoms of physical distress.      Resistance Training   Training Prescription Yes    Weight black bands    Reps 10-15    Time 10 Minutes      Interval Training   Interval Training No      Recumbant Bike   Level 4    RPM 60    Watts 57    Minutes 15    METs 3.1      NuStep   Level 4    SPM 98    Minutes 15    METs 3.1          Nutrition:  Target Goals: Understanding of nutrition guidelines, daily intake of sodium 1500mg , cholesterol 200mg , calories 30% from fat and 7% or less from saturated fats, daily to have 5 or more servings of fruits and vegetables.  Biometrics:    Nutrition Therapy Plan and Nutrition Goals:  Nutrition Therapy & Goals - 12/21/23 1145       Nutrition Therapy   Diet Heart Healthy Diet    Drug/Food Interactions Statins/Certain Fruits      Personal Nutrition Goals   Nutrition Goal Patient to improve diet quality by using the plate method as a guide for meal planning to include lean protein/plant protein, fruits, vegetables, whole grains, nonfat dairy as part of a well-balanced diet.    Comments Goals in progress. Marvin Chandler has medical history of DM2(metformin, jardiance), PAF, HTN, cardiomyopathy, CHF(lasix , entresto ), OSA, COPD2. He reports eating 1-2 meals per day and multiple snacks. He often eats out with his wife but is mindful of sodium intake and vegetable intake. He weighs ~3x/week and checks blood sugar a few times per week as well. LDL is well controlled. A1c is slightly above goal. He has maintained his weight since starting with our program. Patient will benefit from participation in pulmonary rehab for nutrition education, exercise, and lifestyle modification.      Intervention Plan   Intervention Prescribe, educate and counsel regarding individualized specific dietary modifications aiming towards targeted core  components such as weight, hypertension, lipid management, diabetes, heart failure and other comorbidities.;Nutrition handout(s) given to patient.    Expected Outcomes Short Term Goal: Understand basic principles of dietary content, such as calories, fat, sodium, cholesterol and nutrients.;Long Term Goal: Adherence to prescribed nutrition plan.          Nutrition Assessments:  MEDIFICTS Score Key: >=70 Need  to make dietary changes  40-70 Heart Healthy Diet <= 40 Therapeutic Level Cholesterol Diet   Picture Your Plate Scores: <59 Unhealthy dietary pattern with much room for improvement. 41-50 Dietary pattern unlikely to meet recommendations for good health and room for improvement. 51-60 More healthful dietary pattern, with some room for improvement.  >60 Healthy dietary pattern, although there may be some specific behaviors that could be improved.    Nutrition Goals Re-Evaluation:  Nutrition Goals Re-Evaluation     Row Name 11/21/23 1106 12/21/23 1145           Goals   Current Weight 220 lb 14.4 oz (100.2 kg) 216 lb 4.3 oz (98.1 kg)      Comment A1c 7.1, LDL 61, HD l33 A1c 7.1, LDL 61, HDL l33      Expected Outcome Marvin Chandler has medical history of DM2(metformin, jardiance), PAF, HTN, cardiomyopathy, CHF(lasix , entresto ), OSA, COPD2. He reports eating 1-2 meals per day and multiple snacks. He often eats out with his wife but is mindful of sodium intake and vegetable intake. He weighs ~3x/week and checks blood sugar a few times per week as well. Will continue to monitor weight while enrolled in pulmonary rehab. LDL is well controlled. A1c is slightly above goal. Patient will benefit from participation in pulmonary rehab for nutrition education, exercise, and lifestyle modification. Goals in progress. Marvin Chandler has medical history of DM2(metformin, jardiance), PAF, HTN, cardiomyopathy, CHF(lasix , entresto ), OSA, COPD2. He reports eating 1-2 meals per day and multiple snacks. He often eats out  with his wife but is mindful of sodium intake and vegetable intake. He weighs ~3x/week and checks blood sugar a few times per week as well. LDL is well controlled. A1c is slightly above goal. He has maintained his weight since starting with our program. Patient will benefit from participation in pulmonary rehab for nutrition education, exercise, and lifestyle modification.         Nutrition Goals Discharge (Final Nutrition Goals Re-Evaluation):  Nutrition Goals Re-Evaluation - 12/21/23 1145       Goals   Current Weight 216 lb 4.3 oz (98.1 kg)    Comment A1c 7.1, LDL 61, HDL l33    Expected Outcome Goals in progress. Marvin Chandler has medical history of DM2(metformin, jardiance), PAF, HTN, cardiomyopathy, CHF(lasix , entresto ), OSA, COPD2. He reports eating 1-2 meals per day and multiple snacks. He often eats out with his wife but is mindful of sodium intake and vegetable intake. He weighs ~3x/week and checks blood sugar a few times per week as well. LDL is well controlled. A1c is slightly above goal. He has maintained his weight since starting with our program. Patient will benefit from participation in pulmonary rehab for nutrition education, exercise, and lifestyle modification.          Psychosocial: Target Goals: Acknowledge presence or absence of significant depression and/or stress, maximize coping skills, provide positive support system. Participant is able to verbalize types and ability to use techniques and skills needed for reducing stress and depression.  Initial Review & Psychosocial Screening:  Initial Psych Review & Screening - 11/10/23 0959       Initial Review   Current issues with History of Depression   PTSD     Family Dynamics   Good Support System? Yes      Barriers   Psychosocial barriers to participate in program There are no identifiable barriers or psychosocial needs.      Screening Interventions   Interventions Encouraged to exercise  Quality of Life  Scores:  Scores of 19 and below usually indicate a poorer quality of life in these areas.  A difference of  2-3 points is a clinically meaningful difference.  A difference of 2-3 points in the total score of the Quality of Life Index has been associated with significant improvement in overall quality of life, self-image, physical symptoms, and general health in studies assessing change in quality of life.  PHQ-9: Review Flowsheet       11/10/2023 10/31/2014 02/19/2014 12/31/2013  Depression screen PHQ 2/9  Decreased Interest 0 0 0 0  Down, Depressed, Hopeless 0 0 0 0  PHQ - 2 Score 0 0 0 0  Altered sleeping 1 - - -  Tired, decreased energy 2 - - -  Change in appetite 0 - - -  Feeling bad or failure about yourself  0 - - -  Trouble concentrating 0 - - -  Moving slowly or fidgety/restless 0 - - -  Suicidal thoughts 0 - - -  PHQ-9 Score 3 - - -  Difficult doing work/chores Not difficult at all - - -   Interpretation of Total Score  Total Score Depression Severity:  1-4 = Minimal depression, 5-9 = Mild depression, 10-14 = Moderate depression, 15-19 = Moderately severe depression, 20-27 = Severe depression   Psychosocial Evaluation and Intervention:  Psychosocial Evaluation - 11/10/23 1026       Psychosocial Evaluation & Interventions   Interventions Encouraged to exercise with the program and follow exercise prescription    Comments Gladys states he has a history of PTSD and feels like it is starting to get worse. He has attended a class at the [facility] in the past for his PTSD. He states he will reach out to the [facility] to see if he can take the class again. Denies any needs or referrals from us  at this time.    Expected Outcomes For Marvin Chandler to participate in PR free of any psychosocial barriers or concerns    Continue Psychosocial Services  No Follow up required          Psychosocial Re-Evaluation:  Psychosocial Re-Evaluation     Row Name 11/29/23 [phone] 12/26/23 1529 01/22/24 1208          Psychosocial Re-Evaluation   Current issues with History of Depression History of Depression History of Depression  Hx PTSD     Comments Gladys continues to deny any new psychosocial barriers or concerns at this time. Gladys continues to deny any new psychosocial barriers or concerns. Monthly psychosocial re-evaluation as follows: Marvin Chandler reached out the [facility] and started seeing a therapist for increased s/s of PTSD. He also stated that he likes watching the [facility] He states he has good support from his wife, daughter who is an Charity fundraiser, and his family. He declines any additional needs or resources.     Expected Outcomes For Marvin Chandler to participate in PR free of any psychosocial barriers or concerns For Marvin Chandler to participate in PR free of any psychosocial barriers or concerns For Marvin Chandler to participate in PR free of any psychosocial barriers or concerns     Interventions Encouraged to attend Pulmonary Rehabilitation for the exercise Encouraged to attend Pulmonary Rehabilitation for the exercise Encouraged to attend Pulmonary Rehabilitation for the exercise     Continue Psychosocial Services  No Follow up required No Follow up required Follow up required by staff        Psychosocial Discharge (Final Psychosocial Re-Evaluation):  Psychosocial Re-Evaluation - 01/22/24  1208       Psychosocial Re-Evaluation   Current issues with History of Depression   Hx PTSD   Comments Monthly psychosocial re-evaluation as follows: Marvin Chandler reached out the [facility] and started seeing a therapist for increased s/s of PTSD. He also stated that he likes watching the [facility]. He states he has good support from his wife, daughter who is an Charity fundraiser, and his family. He declines any additional needs or resources.    Expected Outcomes For Marvin Chandler to participate in PR free of any psychosocial barriers or concerns    Interventions Encouraged to attend Pulmonary Rehabilitation for the exercise    Continue Psychosocial Services  Follow up required by staff           Education: Education Goals: Education classes will be provided on a weekly basis, covering required topics. Participant will state understanding/return demonstration of topics presented.  Learning Barriers/Preferences:  Learning Barriers/Preferences - 11/10/23 1009       Learning Barriers/Preferences   Learning Barriers Sight;Reading;Exercise Concerns    Learning Preferences Group Instruction;Individual Instruction;Skilled Demonstration          Education Topics: Know Your Numbers Group instruction that is supported by a PowerPoint presentation. Instructor discusses importance of knowing and understanding resting, exercise, and post-exercise oxygen saturation, heart rate, and blood pressure. Oxygen saturation, heart rate, blood pressure, rating of perceived exertion, and dyspnea are reviewed along with a normal range for these values.  Flowsheet Row PULMONARY REHAB CHRONIC OBSTRUCTIVE PULMONARY DISEASE from 01/18/2024 in [facility], [facility]  Date 01/18/24  Educator EP  Instruction Review Code 1- Verbalizes Understanding    Exercise for the Pulmonary Patient Group instruction that is supported by a PowerPoint presentation. Instructor discusses benefits of exercise, core components of exercise, frequency, duration, and intensity of an exercise routine, importance of utilizing pulse oximetry during exercise, safety while exercising, and options of places to exercise outside of rehab.  Flowsheet Row PULMONARY REHAB CHRONIC OBSTRUCTIVE PULMONARY DISEASE from 01/11/2024 in [facility] Heart, Vascular, & Lung Health  Date 01/11/24  Educator EP  Instruction Review Code 1- Verbalizes Understanding    MET Level  Group instruction provided by PowerPoint, verbal discussion, and written material to support subject matter. Instructor reviews what METs are and how to increase METs.  Flowsheet Row PULMONARY  REHAB CHRONIC OBSTRUCTIVE PULMONARY DISEASE from 12/14/2023 in [facility] for Heart, Vascular, & Lung Health  Date 12/14/23  Educator EP  Instruction Review Code 1- Verbalizes Understanding    Pulmonary Medications Verbally interactive group education provided by instructor with focus on inhaled medications and proper administration. Flowsheet Row PULMONARY REHAB CHRONIC OBSTRUCTIVE PULMONARY DISEASE from 01/04/2024 in [facility] for Heart, Vascular, & Lung Health  Date 01/04/24  Educator RT  Instruction Review Code 1- Verbalizes Understanding    Anatomy and Physiology of the Respiratory System Group instruction provided by PowerPoint, verbal discussion, and written material to support subject matter. Instructor reviews respiratory cycle and anatomical components of the respiratory system and their functions. Instructor also reviews differences in obstructive and restrictive respiratory diseases with examples of each.  Flowsheet Row PULMONARY REHAB CHRONIC OBSTRUCTIVE PULMONARY DISEASE from 12/28/2023 in [facility] Heart, Vascular, & Lung Health  Date 12/28/23  Educator RT  Instruction Review Code 1- Verbalizes Understanding    Oxygen Safety Group instruction provided by PowerPoint, verbal discussion, and written material to support subject  matter. There is an overview of "What is Oxygen" and "Why do we need it".  Instructor also reviews how to create a safe environment for oxygen use, the importance of using oxygen as prescribed, and the risks of noncompliance. There is a brief discussion on traveling with oxygen and resources the patient may utilize. Flowsheet Row PULMONARY REHAB CHRONIC OBSTRUCTIVE PULMONARY DISEASE from 01/25/2024 in [facility] for Heart, Vascular, & Lung Health  Date 01/25/24  Educator RN  Instruction Review Code 1- Verbalizes Understanding    Oxygen Use Group  instruction provided by PowerPoint, verbal discussion, and written material to discuss how supplemental oxygen is prescribed and different types of oxygen supply systems. Resources for more information are provided.    Breathing Techniques Group instruction that is supported by demonstration and informational handouts. Instructor discusses the benefits of pursed lip and diaphragmatic breathing and detailed demonstration on how to perform both.     Risk Factor Reduction Group instruction that is supported by a PowerPoint presentation. Instructor discusses the definition of a risk factor, different risk factors for pulmonary disease, and how the heart and lungs work together. Flowsheet Row PULMONARY REHAB CHRONIC OBSTRUCTIVE PULMONARY DISEASE from 12/07/2023 in [facility] for Heart, Vascular, & Lung Health  Date 12/07/23  Educator EP  Instruction Review Code 1- Verbalizes Understanding    Pulmonary Diseases Group instruction provided by PowerPoint, verbal discussion, and written material to support subject matter. Instructor gives an overview of the different type of pulmonary diseases. There is also a discussion on risk factors and symptoms as well as ways to manage the diseases. Flowsheet Row PULMONARY REHAB CHRONIC OBSTRUCTIVE PULMONARY DISEASE from 12/21/2023 in [facility] Heart, Vascular, & Lung Health  Date 12/21/23  Educator RT  Instruction Review Code 1- Verbalizes Understanding    Stress and Energy Conservation Group instruction provided by PowerPoint, verbal discussion, and written material to support subject matter. Instructor gives an overview of stress and the impact it can have on the body. Instructor also reviews ways to reduce stress. There is also a discussion on energy conservation and ways to conserve energy throughout the day. Flowsheet Row PULMONARY REHAB CHRONIC OBSTRUCTIVE PULMONARY DISEASE from 11/23/2023 in [facility] for Heart, Vascular, & Lung Health  Date 11/23/23  Educator RN  Instruction Review Code 1- Verbalizes Understanding    Warning Signs and Symptoms Group instruction provided by PowerPoint, verbal discussion, and written material to support subject matter. Instructor reviews warning signs and symptoms of stroke, heart attack, cold and flu. Instructor also reviews ways to prevent the spread of infection. Flowsheet Row PULMONARY REHAB CHRONIC OBSTRUCTIVE PULMONARY DISEASE from 11/30/2023 in [facility] for Heart, Vascular, & Lung Health  Date 11/30/23  Educator RN  Instruction Review Code 1- Verbalizes Understanding    Other Education Group or individual verbal, written, or video instructions that support the educational goals of the pulmonary rehab program.    Knowledge Questionnaire Score:  Knowledge Questionnaire Score - 11/10/23 1031       Knowledge Questionnaire Score   Pre Score 13/18          Core Components/Risk Factors/Patient Goals at Admission:  Personal Goals and Risk Factors at Admission - 11/10/23 1007       Core Components/Risk Factors/Patient Goals on Admission    Weight Management Yes;Weight Maintenance    Intervention Weight Management: Develop a combined nutrition and exercise program designed to reach  desired caloric intake, while maintaining appropriate intake of nutrient and fiber, sodium and fats, and appropriate energy expenditure required for the weight goal.;Weight Management: Provide education and appropriate resources to help participant work on and attain dietary goals.;Weight Management/Obesity: Establish reasonable short term and long term weight goals.    Admit Weight 217 lb 2.5 oz (98.5 kg)    Expected Outcomes Short Term: Continue to assess and modify interventions until short term weight is achieved;Weight Maintenance: Understanding of the daily nutrition guidelines, which includes 25-35% calories from  fat, 7% or less cal from saturated fats, less than 200mg  cholesterol, less than 1.5gm of sodium, & 5 or more servings of fruits and vegetables daily;Long Term: Adherence to nutrition and physical activity/exercise program aimed toward attainment of established weight goal;Understanding recommendations for meals to include 15-35% energy as protein, 25-35% energy from fat, 35-60% energy from carbohydrates, less than 200mg  of dietary cholesterol, 20-35 gm of total fiber daily;Understanding of distribution of calorie intake throughout the day with the consumption of 4-5 meals/snacks    Improve shortness of breath with ADL's Yes    Intervention Provide education, individualized exercise plan and daily activity instruction to help decrease symptoms of SOB with activities of daily living.    Expected Outcomes Short Term: Improve cardiorespiratory fitness to achieve a reduction of symptoms when performing ADLs;Long Term: Be able to perform more ADLs without symptoms or delay the onset of symptoms          Core Components/Risk Factors/Patient Goals Review:   Goals and Risk Factor Review     Row Name 11/29/23 9072 12/26/23 1529 01/22/24 1213         Core Components/Risk Factors/Patient Goals Review   Personal Goals Review Weight Management/Obesity;Improve shortness of breath with ADL's;Develop more efficient breathing techniques such as purse lipped breathing and diaphragmatic breathing and practicing self-pacing with activity. Weight Management/Obesity;Improve shortness of breath with ADL's;Develop more efficient breathing techniques such as purse lipped breathing and diaphragmatic breathing and practicing self-pacing with activity. Weight Management/Obesity;Improve shortness of breath with ADL's;Develop more efficient breathing techniques such as purse lipped breathing and diaphragmatic breathing and practicing self-pacing with activity.     Review Monthly review of patient's Core Components/Risk  Factors/Patient Goals are as follows: Goal progressing for maintaining weight. Goal progressing for improving shortness of breath with ADL's. Gladys is currently exercising on RA to maintain sats >88%. Goal progressing for developing more efficient breathing techniques such as purse lipped breathing and diaphragmatic breathing; and practicing self-pacing with activity. We will continue to monitor Marvin Chandler's progress throughout the program. Monthly review of patient's Core Components/Risk Factors/Patient Goals are as follows: Goal progressing for maintaining weight. Goal progressing for improving shortness of breath with ADL's. Gladys is currently exercising on RA to maintain sats >88%. Goal progressing for developing more efficient breathing techniques such as purse lipped breathing and diaphragmatic breathing; and practicing self-pacing with activity. We will continue to monitor Marvin Chandler's progress throughout the program. Monthly review of patient's Core Components/Risk Factors/Patient Goals are as follows: Goal progressing for maintaining weight. Gladys is knowledgeable of what he needs to do to maintain his weight. He is following the plate method and is within ~4# of his starting PR weight. Goal progressing for improving shortness of breath with ADL's. Gladys is currently exercising on RA to maintain sats >88%. He has been able to increase his speed, workload, and METs on the [employer] and bike. Goal met for developing more efficient breathing techniques such as purse lipped breathing and diaphragmatic breathing;  and practicing self-pacing with activity. John can perform purse lipped breathing while short of breath. He demonstrated this while performing the warmup and exercising. He can initiate PLB on his own. He works on diaphragmatic breathing at home. We will continue to monitor Marvin Chandler's progress throughout the program.     Expected Outcomes To improve shortness of breath with ADL's, develop more efficient  breathing techniques such as purse lipped breathing and diaphragmatic breathing; and practicing self-pacing with activity and maintain weight. To improve shortness of breath with ADL's, develop more efficient breathing techniques such as purse lipped breathing and diaphragmatic breathing; and practicing self-pacing with activity and maintain weight. Pt will show progress toward meeting expected goals and outcomes.        Core Components/Risk Factors/Patient Goals at Discharge (Final Review):   Goals and Risk Factor Review - 01/22/24 1213       Core Components/Risk Factors/Patient Goals Review   Personal Goals Review Weight Management/Obesity;Improve shortness of breath with ADL's;Develop more efficient breathing techniques such as purse lipped breathing and diaphragmatic breathing and practicing self-pacing with activity.    Review Monthly review of patient's Core Components/Risk Factors/Patient Goals are as follows: Goal progressing for maintaining weight. Gladys is knowledgeable of what he needs to do to maintain his weight. He is following the plate method and is within ~4# of his starting PR weight. Goal progressing for improving shortness of breath with ADL's. Gladys is currently exercising on RA to maintain sats >88%. He has been able to increase his speed, workload, and METs on the [place] and bike. Goal met for developing more efficient breathing techniques such as purse lipped breathing and diaphragmatic breathing; and practicing self-pacing with activity. John can perform purse lipped breathing while short of breath. He demonstrated this while performing the warmup and exercising. He can initiate PLB on his own. He works on diaphragmatic breathing at home. We will continue to monitor Marvin Chandler's progress throughout the program.    Expected Outcomes Pt will show progress toward meeting expected goals and outcomes.          ITP Comments: Pt is making expected progress toward Pulmonary Rehab  goals after completing 19 session(s). Recommend continued exercise, life style modification, education, and utilization of breathing techniques to increase stamina and strength, while also decreasing shortness of breath with exertion.  Dr. Slater Staff is Medical Director for Pulmonary Rehab at [facility].

## 2024-02-01 ENCOUNTER — Encounter (HOSPITAL_COMMUNITY)
Admission: RE | Admit: 2024-02-01 | Discharge: 2024-02-01 | Disposition: A | Discharge status: Home / Self Care | Source: Ambulatory Visit | Attending: Pulmonary Disease | Admitting: Pulmonary Disease

## 2024-02-01 DIAGNOSIS — J449 Chronic obstructive pulmonary disease, unspecified: Secondary | ICD-10-CM | POA: Insufficient documentation

## 2024-02-01 NOTE — Progress Notes (Signed)
 Home Exercise Prescription I have reviewed a Home Exercise Prescription with Marvin Chandler. Marvin Chandler is performing the warmup and cooldown at home but not doing any continuous aerobic exercise. He does have access to a gym. I encouraged him to walk, cycle, or do similar machines 3-5 days/wk for 30-45 min/day. He agreed with my recommendations. Marvin Chandler seems motivated to exercise at home. The patient stated that their goals were to maintain health. We reviewed exercise guidelines, target heart rate during exercise, RPE Scale, weather conditions, endpoints for exercise, warmup and cool down. The patient is encouraged to come to me with any questions. I will continue to follow up with the patient to assist them with progression and safety. Spent 15 min with patient discussing home exercise plan and goals  Marvin JINNY Moats, MS, ACSM-CEP 02/01/2024 3:19 PM

## 2024-02-01 NOTE — Progress Notes (Signed)
 Daily Session Note  Patient Details  Name: Marvin Chandler MRN: [id number] Date of Birth: 1950/07/04 Referring Provider:   [name] Pulmonary Rehab Walk Test from 11/10/2023 in [facility]  Referring Provider [name]    Encounter Date: 02/01/2024  Check In:  Session Check In - 02/01/24 1032       Check-In   Supervising physician immediately available to respond to emergencies [facility] MD immediately available    Physician(s) Jackee Alberts, NP    Location [facility]    Staff Present Augustin Sharps, Candia Levin, RN, Maud Moats, MS, ACSM-CEP, Exercise Physiologist;Jetta Vannie BS, ACSM-CEP, Exercise Physiologist    Virtual Visit No    Medication changes reported     No    Tobacco Cessation No Change    Warm-up and Cool-down Performed as group-led instruction    Resistance Training Performed Yes    VAD Patient? No    PAD/SET Patient? No      Pain Assessment   Currently in Pain? No/denies    Pain Score 0-No pain    Multiple Pain Sites No          Capillary Blood Glucose: No results found for this or any previous visit (from the past 24 hours).    Social History   Tobacco Use  Smoking Status Former   Current packs/day: 0.00   Average packs/day: 1 pack/day for 7.0 years (7.0 ttl pk-yrs)   Types: Cigarettes   Start date: 11/06/1990   Quit date: 11/05/1997   Years since quitting: 26.2  Smokeless Tobacco Never    Goals Met:  Proper associated with RPD/PD & O2 Sat Independence with exercise equipment Exercise tolerated well No report of concerns or symptoms today Strength training completed today  Goals Unmet:  Not Applicable  Comments: Service time is from 1015 to 1148.    Dr. Slater Staff is Medical Director for Pulmonary Rehab at [facility].

## 2024-02-06 ENCOUNTER — Encounter (HOSPITAL_COMMUNITY)
Admission: RE | Admit: 2024-02-06 | Discharge: 2024-02-06 | Disposition: A | Discharge status: Home / Self Care | Source: Ambulatory Visit | Attending: Pulmonary Disease | Admitting: Pulmonary Disease

## 2024-02-06 DIAGNOSIS — J449 Chronic obstructive pulmonary disease, unspecified: Secondary | ICD-10-CM

## 2024-02-06 NOTE — Progress Notes (Signed)
 Pulmonary Rehab Incomplete Session Note  Patient Details  Name: Marvin Chandler MRN: [id number] Date of Birth: 01-30-1951 Referring Provider:   [name] Pulmonary Rehab Walk Test from 11/10/2023 in [facility]  Referring Provider [name]    Leodis A Derks did not complete his rehab session. [name] c/o dizziness. [name] stated he cut the grass yesterday and only sipped on gaterade. BP on arrival was 90/62 automatic, could not get manual. Staff elevated legs had pt drink water. After ~39min of rest and hydration BP came up to 102/60. Pt feeling better, less dizzy and symptoms subsided. Pt discharged from class today. HR 89 98% on RA

## 2024-02-08 ENCOUNTER — Encounter (HOSPITAL_COMMUNITY)
Admission: RE | Admit: 2024-02-08 | Discharge: 2024-02-08 | Disposition: A | Discharge status: Home / Self Care | Source: Ambulatory Visit | Attending: Pulmonary Disease | Admitting: Pulmonary Disease

## 2024-02-08 DIAGNOSIS — J449 Chronic obstructive pulmonary disease, unspecified: Secondary | ICD-10-CM

## 2024-02-08 NOTE — Progress Notes (Signed)
 Daily Session Note  Patient Details  Name: Marvin Chandler MRN: [id number] Date of Birth: 01-13-1951 Referring Provider:   [name] Pulmonary Rehab Walk Test from 11/10/2023 in [facility]  Referring Provider [name]    Encounter Date: 02/08/2024  Check In:  Session Check In - 02/08/24 1201       Check-In   Supervising physician immediately available to respond to emergencies [facility] MD immediately available    Physician(s) Josefa Beauvais, NP    [facility]    Staff Present Augustin Sharps, Candia Levin, RN, Maud Moats, MS, ACSM-CEP, Exercise Physiologist;Randi Midge HECKLE, ACSM-CEP, Exercise Physiologist    Virtual Visit No    Medication changes reported     No    Tobacco Cessation No Change    Warm-up and Cool-down Performed as group-led instruction    Resistance Training Performed Yes    VAD Patient? No    PAD/SET Patient? No      Pain Assessment   Currently in Pain? No/denies    Pain Score 0-No pain    Multiple Pain Sites No          Capillary Blood Glucose: No results found for this or any previous visit (from the past 24 hours).    Social History   Tobacco Use  Smoking Status Former   Current packs/day: 0.00   Average packs/day: 1 pack/day for 7.0 years (7.0 ttl pk-yrs)   Types: Cigarettes   Start date: 11/06/1990   Quit date: 11/05/1997   Years since quitting: 26.2  Smokeless Tobacco Never    Goals Met:  Independence with exercise equipment Exercise tolerated well No report of concerns or symptoms today Strength training completed today  Goals Unmet:  Not Applicable  Comments: Service time is from 1027 to 1147    Dr. Slater Staff is Medical Director for Pulmonary Rehab at [facility].

## 2024-02-13 ENCOUNTER — Encounter (HOSPITAL_COMMUNITY)
Admission: RE | Admit: 2024-02-13 | Discharge: 2024-02-13 | Disposition: A | Source: Ambulatory Visit | Attending: Pulmonary Disease

## 2024-02-13 VITALS — Wt 222.4 lb

## 2024-02-13 DIAGNOSIS — J449 Chronic obstructive pulmonary disease, unspecified: Secondary | ICD-10-CM | POA: Diagnosis not present

## 2024-02-13 NOTE — Progress Notes (Signed)
 Daily Session Note  Patient Details  Name: Marvin Chandler MRN: [id number] Date of Birth: 06-22-1950 Referring Provider:   [name] Pulmonary Rehab Walk Test from 11/10/2023 in [facility], [facility], & [facility]  Referring Provider [name]    Encounter Date: 02/13/2024  Check In:  Session Check In - 02/13/24 1123       Check-In   Supervising physician immediately available to respond to emergencies [facility] MD immediately available    Physician(s) Orren Fabry, PA    [facility]    Staff Present Augustin Sharps, Candia Levin, RN, Maud Moats, MS, ACSM-CEP, Exercise Physiologist;Randi Midge BS, ACSM-CEP, Exercise Physiologist    Virtual Visit No    Medication changes reported     No    Tobacco Cessation No Change    Warm-up and Cool-down Performed as group-led instruction    Resistance Training Performed Yes    VAD Patient? No    PAD/SET Patient? No      Pain Assessment   Currently in Pain? No/denies    Multiple Pain Sites No          Capillary Blood Glucose: No results found for this or any previous visit (from the past 24 hours).   Exercise Prescription Changes - 02/13/24 1100       Response to Exercise   Blood Pressure (Admit) 110/60    Blood Pressure (Exercise) 122/58    Blood Pressure (Exit) 92/60    Heart Rate (Admit) 89 bpm    Heart Rate (Exercise) 121 bpm    Heart Rate (Exit) 81 bpm    Oxygen Saturation (Admit) 96 %    Oxygen Saturation (Exercise) 96 %    Oxygen Saturation (Exit) 99 %    Rating of Perceived Exertion (Exercise) 13    Perceived Dyspnea (Exercise) 1    Duration Continue with 30 min of aerobic exercise without signs/symptoms of physical distress.    Intensity THRR unchanged      Progression   Progression Continue to progress workloads to maintain intensity without signs/symptoms of physical distress.      Resistance Training   Training Prescription Yes    Weight black  bands    Reps 10-15    Time 10 Minutes      Recumbant Bike   Level 4    RPM 70    Watts 60    Minutes 15    METs 4      NuStep   Level 4    SPM 90    Minutes 15    METs 3.1          Social History   Tobacco Use  Smoking Status Former   Current packs/day: 0.00   Average packs/day: 1 pack/day for 7.0 years (7.0 ttl pk-yrs)   Types: Cigarettes   Start date: 11/06/1990   Quit date: 11/05/1997   Years since quitting: 26.2  Smokeless Tobacco Never    Goals Met:  Proper associated with RPD/PD & O2 Sat Independence with exercise equipment Exercise tolerated well No report of concerns or symptoms today Strength training completed today  Goals Unmet:  Not Applicable  Comments: Service time is from 1018 to 1134.    Dr. Slater Staff is Medical Director for Pulmonary Rehab at [facility].

## 2024-02-15 ENCOUNTER — Encounter (HOSPITAL_COMMUNITY)
Admission: RE | Admit: 2024-02-15 | Discharge: 2024-02-15 | Disposition: A | Discharge status: Home / Self Care | Source: Ambulatory Visit | Attending: Pulmonary Disease | Admitting: Pulmonary Disease

## 2024-02-15 ENCOUNTER — Encounter (HOSPITAL_COMMUNITY)

## 2024-02-15 DIAGNOSIS — J449 Chronic obstructive pulmonary disease, unspecified: Secondary | ICD-10-CM | POA: Diagnosis not present

## 2024-02-15 NOTE — Progress Notes (Signed)
 Daily Session Note  Patient Details  Name: Marvin Chandler MRN: [id number] Date of Birth: 1950/06/12 Referring Provider:   [name] Pulmonary Rehab Walk Test from 11/10/2023 in [facility], [facility], [facility]  Referring Provider [name]    Encounter Date: 02/15/2024  Check In:  Session Check In - 02/15/24 1027       Check-In   Supervising physician immediately available to respond to emergencies [facility] MD immediately available    Physician(s) Katlin West, NP    [facility]    Staff Present Augustin Sharps, Candia Levin, RN, Maud Moats, MS, ACSM-CEP, Exercise Physiologist;Randi Midge BS, ACSM-CEP, Exercise Physiologist    Virtual Visit No    Medication changes reported     No    Fall or balance concerns reported    No    Tobacco Cessation No Change    Warm-up and Cool-down Performed as group-led instruction    Resistance Training Performed Yes    VAD Patient? No    PAD/SET Patient? No      Pain Assessment   Currently in Pain? No/denies          Capillary Blood Glucose: No results found for this or any previous visit (from the past 24 hours).    Social History   Tobacco Use  Smoking Status Former   Current packs/day: 0.00   Average packs/day: 1 pack/day for 7.0 years (7.0 ttl pk-yrs)   Types: Cigarettes   Start date: 11/06/1990   Quit date: 11/05/1997   Years since quitting: 26.2  Smokeless Tobacco Never    Goals Met:  Proper associated with RPD/PD & O2 Sat Independence with exercise equipment Exercise tolerated well No report of concerns or symptoms today Strength training completed today  Goals Unmet:  Not Applicable  Comments: Service time is from 1013 to 1200.    Dr. Slater Staff is Medical Director for Pulmonary Rehab at [facility].

## 2024-02-20 ENCOUNTER — Encounter (HOSPITAL_COMMUNITY)

## 2024-02-20 ENCOUNTER — Encounter (HOSPITAL_COMMUNITY)
Admission: RE | Admit: 2024-02-20 | Discharge: 2024-02-20 | Disposition: A | Discharge status: Home / Self Care | Source: Ambulatory Visit | Attending: Pulmonary Disease | Admitting: Pulmonary Disease

## 2024-02-20 DIAGNOSIS — J449 Chronic obstructive pulmonary disease, unspecified: Secondary | ICD-10-CM

## 2024-02-20 NOTE — Progress Notes (Signed)
 Daily Session Note  Patient Details  Name: LENNIX KNEISEL MRN: [id number] Date of Birth: 1950-05-13 Referring Provider:   [name] Pulmonary Rehab Walk Test from 11/10/2023 in [facility]  Referring Provider [name]    Encounter Date: 02/20/2024  Check In:  Session Check In - 02/20/24 1125       Check-In   Supervising physician immediately available to respond to emergencies [facility] MD immediately available    Physician(s) Rosabel Mose, NP    [facility]    Staff Present Augustin Sharps, Candia Levin, RN, BSN;Randi [employer], ACSM-CEP, Exercise Physiologist;Olinty Valere, MS, ACSM-CEP, Exercise Physiologist    Virtual Visit No    Medication changes reported     No    Fall or balance concerns reported    No    Tobacco Cessation No Change    Warm-up and Cool-down Performed as group-led instruction    Resistance Training Performed Yes    VAD Patient? No    PAD/SET Patient? No      Pain Assessment   Currently in Pain? No/denies    Multiple Pain Sites No          Capillary Blood Glucose: No results found for this or any previous visit (from the past 24 hours).    Social History   Tobacco Use  Smoking Status Former   Current packs/day: 0.00   Average packs/day: 1 pack/day for 7.0 years (7.0 ttl pk-yrs)   Types: Cigarettes   Start date: 11/06/1990   Quit date: 11/05/1997   Years since quitting: 26.3  Smokeless Tobacco Never    Goals Met:  Independence with exercise equipment Exercise tolerated well No report of concerns or symptoms today Strength training completed today  Goals Unmet:  Not Applicable  Comments: Service time is from 1017 to 1140    Dr. Slater Staff is Medical Director for Pulmonary Rehab at [facility].

## 2024-02-28 NOTE — Progress Notes (Signed)
 Discharge Progress Report  Patient Details  Name: Marvin Chandler MRN: [id number] Date of Birth: [birth year] Referring Provider:   [name] Walk Test from 11/10/2023 in [facility]  Referring Provider [name]     Number of Visits: 25  Reason for Discharge:  Patient has met program and personal goals.  Smoking History:  Social History   Tobacco Use  Smoking Status Former   Current packs/day: 0.00   Average packs/day: 1 pack/day for 7.0 years (7.0 ttl pk-yrs)   Types: Cigarettes   Start date: 11/06/1990   Quit date: 11/05/1997   Years since quitting: 26.3  Smokeless Tobacco Never    Diagnosis:  Stage 2 moderate COPD by GOLD classification (HCC)  ADL [facility]:  Pulmonary Assessment Scores     Row Name 11/10/23 1032 02/08/24 1522       ADL [facility]   ADL Phase Entry Exit    SOB Score total 82 43      CAT Score   CAT Score 25 17      mMRC Score   mMRC Score 4 --       Initial Exercise Prescription:  Initial Exercise Prescription - 11/10/23 1000       Date of Initial Exercise RX and Referring Provider   Date 11/10/23    Referring Provider [name]    Expected Discharge Date 02/08/24      Recumbant Bike   Level 1    RPM 25    Watts 40    Minutes 15    METs 2      NuStep   Level 1    SPM 70    Minutes 15    METs 2.5      Prescription Details   Frequency (times per week) 2    Duration Progress to 30 minutes of continuous aerobic without signs/symptoms of physical distress      Intensity   THRR 40-80% of Max Heartrate 58-117    Ratings of Perceived Exertion 11-13    Perceived Dyspnea 0-4      Progression   Progression Continue to progress workloads to maintain intensity without signs/symptoms of physical distress.      Resistance Training   Training Prescription Yes    Weight blue bands    Reps 10-15          Discharge Exercise Prescription (Final Exercise Prescription  Changes):  Exercise Prescription Changes - 02/13/24 1100       Response to Exercise   Blood Pressure (Admit) 110/60    Blood Pressure (Exercise) 122/58    Blood Pressure (Exit) 92/60    Heart Rate (Admit) 89 bpm    Heart Rate (Exercise) 121 bpm    Heart Rate (Exit) 81 bpm    Oxygen Saturation (Admit) 96 %    Oxygen Saturation (Exercise) 96 %    Oxygen Saturation (Exit) 99 %    Rating of Perceived Exertion (Exercise) 13    Perceived Dyspnea (Exercise) 1    Duration Continue with 30 min of aerobic exercise without signs/symptoms of physical distress.    Intensity THRR unchanged      Progression   Progression Continue to progress workloads to maintain intensity without signs/symptoms of physical distress.      Resistance Training   Training Prescription Yes    Weight black bands    Reps 10-15    Time 10 Minutes      Recumbant Bike  Level 4    RPM 70    Watts 60    Minutes 15    METs 4      NuStep   Level 4    SPM 90    Minutes 15    METs 3.1          Functional Capacity:  6 Minute Walk     Row Name 11/10/23 1038 02/13/24 1541       6 Minute Walk   Phase Initial Discharge    Distance 1037 feet 1290 feet    Distance % Change -- 24.4 %    Distance Feet Change -- 253 ft    Walk Time 6 minutes 6 minutes    # of Rest Breaks 0 0    MPH 1.96 2.44    METS 1.97 2.46    RPE 11 13    Perceived Dyspnea  1 1    VO2 Peak 6.89 8.62    Symptoms No No    Resting HR 81 bpm 88 bpm    Resting BP 92/60 110/60    Resting Oxygen Saturation  98 % 97 %    Exercise Oxygen Saturation  during 6 min walk 92 % 97 %    Max Ex. HR 100 bpm 121 bpm    Max Ex. BP 112/50 104/60    2 Minute Post BP 94/50 100/62      Interval HR   1 Minute HR 96 118    2 Minute HR 99 116    3 Minute HR 100 119    4 Minute HR 98 120    5 Minute HR 100 121    6 Minute HR 99 114    2 Minute Post HR 84 97    Interval Heart Rate? Yes Yes      Interval Oxygen   Interval Oxygen? Yes Yes     Baseline Oxygen Saturation % 98 % 97 %    1 Minute Oxygen Saturation % 96 % 99 %    1 Minute Liters of Oxygen 0 L 0 L    2 Minute Oxygen Saturation % 98 % 98 %    2 Minute Liters of Oxygen 0 L 0 L    3 Minute Oxygen Saturation % 94 % 98 %    3 Minute Liters of Oxygen 0 L 0 L    4 Minute Oxygen Saturation % 95 % 97 %    4 Minute Liters of Oxygen 0 L 0 L    5 Minute Oxygen Saturation % 92 % 98 %    5 Minute Liters of Oxygen 0 L 0 L    6 Minute Oxygen Saturation % 91 % 97 %    6 Minute Liters of Oxygen 0 L 0 L    2 Minute Post Oxygen Saturation % 100 % 99 %    2 Minute Post Liters of Oxygen 0 L 0 L       Psychological, QOL, Others - Outcomes: PHQ 2/9:    02/08/2024    3:22 PM 11/10/2023   10:35 AM 10/31/2014    4:28 PM 02/19/2014   11:30 AM 12/31/2013    2:29 PM  Depression screen PHQ 2/9  Decreased Interest 1 0 0 0 0  Down, Depressed, Hopeless 1 0 0 0 0  PHQ - 2 Score 2 0 0 0 0  Altered sleeping 0 1     Tired, decreased energy 0 2     Change in  appetite 0 0     Feeling bad or failure about yourself  0 0     Trouble concentrating 0 0     Moving slowly or fidgety/restless 0 0     Suicidal thoughts 0 0     PHQ-9 Score 2 3     Difficult doing work/chores Not difficult at all Not difficult at all       Quality of Life:   Personal Goals: Goals established at orientation with interventions provided to work toward goal.  Personal Goals and Risk Factors at Admission - 11/10/23 1007       Core Components/Risk Factors/Patient Goals on Admission    Weight Management Yes;Weight Maintenance    Intervention Weight Management: Develop a combined nutrition and exercise program designed to reach desired caloric intake, while maintaining appropriate intake of nutrient and fiber, sodium and fats, and appropriate energy expenditure required for the weight goal.;Weight Management: Provide education and appropriate resources to help participant work on and attain dietary goals.;Weight  Management/Obesity: Establish reasonable short term and long term weight goals.    Admit Weight 217 lb 2.5 oz (98.5 kg)    Expected Outcomes Short Term: Continue to assess and modify interventions until short term weight is achieved;Weight Maintenance: Understanding of the daily nutrition guidelines, which includes 25-35% calories from fat, 7% or less cal from saturated fats, less than 200mg  cholesterol, less than 1.5gm of sodium, & 5 or more servings of fruits and vegetables daily;Long Term: Adherence to nutrition and physical activity/exercise program aimed toward attainment of established weight goal;Understanding recommendations for meals to include 15-35% energy as protein, 25-35% energy from fat, 35-60% energy from carbohydrates, less than 200mg  of dietary cholesterol, 20-35 gm of total fiber daily;Understanding of distribution of calorie intake throughout the day with the consumption of 4-5 meals/snacks    Improve shortness of breath with ADL's Yes    Intervention Provide education, individualized exercise plan and daily activity instruction to help decrease symptoms of SOB with activities of daily living.    Expected Outcomes Short Term: Improve cardiorespiratory fitness to achieve a reduction of symptoms when performing ADLs;Long Term: Be able to perform more ADLs without symptoms or delay the onset of symptoms           Personal Goals Discharge:  Goals and Risk Factor Review     Row Name 11/29/23 9072 12/26/23 1529 01/22/24 1213 02/28/24 1401       Core Components/Risk Factors/Patient Goals Review   Personal Goals Review Weight Management/Obesity;Improve shortness of breath with ADL's;Develop more efficient breathing techniques such as purse lipped breathing and diaphragmatic breathing and practicing self-pacing with activity. Weight Management/Obesity;Improve shortness of breath with ADL's;Develop more efficient breathing techniques such as purse lipped breathing and diaphragmatic  breathing and practicing self-pacing with activity. Weight Management/Obesity;Improve shortness of breath with ADL's;Develop more efficient breathing techniques such as purse lipped breathing and diaphragmatic breathing and practicing self-pacing with activity. Weight Management/Obesity;Improve shortness of breath with ADL's    Review Monthly review of patient's Core Components/Risk Factors/Patient Goals are as follows: Goal progressing for maintaining weight. Goal progressing for improving shortness of breath with ADL's. Marvin Chandler is currently exercising on RA to maintain sats >88%. Goal progressing for developing more efficient breathing techniques such as purse lipped breathing and diaphragmatic breathing; and practicing self-pacing with activity. We will continue to monitor Marvin Chandler's progress throughout the program. Monthly review of patient's Core Components/Risk Factors/Patient Goals are as follows: Goal progressing for maintaining weight. Goal progressing for improving shortness of  breath with ADL's. Marvin Chandler is currently exercising on RA to maintain sats >88%. Goal progressing for developing more efficient breathing techniques such as purse lipped breathing and diaphragmatic breathing; and practicing self-pacing with activity. We will continue to monitor Marvin Chandler's progress throughout the program. Monthly review of patient's Core Components/Risk Factors/Patient Goals are as follows: Goal progressing for maintaining weight. Marvin Chandler is knowledgeable of what he needs to do to maintain his weight. He is following the plate method and is within ~4# of his starting PR weight. Goal progressing for improving shortness of breath with ADL's. Marvin Chandler is currently exercising on RA to maintain sats >88%. He has been able to increase his speed, workload, and METs on the Nustep and bike. Goal met for developing more efficient breathing techniques such as purse lipped breathing and diaphragmatic breathing; and practicing self-pacing  with activity. Marvin Chandler can perform purse lipped breathing while short of breath. He demonstrated this while performing the warmup and exercising. He can initiate PLB on his own. He works on diaphragmatic breathing at home. We will continue to monitor Marvin Chandler's progress throughout the program. Marvin Chandler graduated from the [facility] program on 02/20/24. He did not meet his goal for weight loss. His weight was up 7#'s from the start of the program. He did meet his goal for improving shortness of breath with ADL's. His initial SOB score 82/ post 43. Marvin Chandler had good attendance during the program and did well overall. We wish him the best.    Expected Outcomes To improve shortness of breath with ADL's, develop more efficient breathing techniques such as purse lipped breathing and diaphragmatic breathing; and practicing self-pacing with activity and maintain weight. To improve shortness of breath with ADL's, develop more efficient breathing techniques such as purse lipped breathing and diaphragmatic breathing; and practicing self-pacing with activity and maintain weight. Pt will show progress toward meeting expected goals and outcomes. To continue to exercise and modify his nutrition and lifestyle post graduation       Exercise Goals and Review:  Exercise Goals     Row Name 11/10/23 1000             Exercise Goals   Increase Physical Activity Yes       Intervention Provide advice, education, support and counseling about physical activity/exercise needs.;Develop an individualized exercise prescription for aerobic and resistive training based on initial evaluation findings, risk stratification, comorbidities and participant's personal goals.       Expected Outcomes Short Term: Attend rehab on a regular basis to increase amount of physical activity.;Long Term: Exercising regularly at least 3-5 days a week.;Long Term: Add in home exercise to make exercise part of routine and to increase amount of physical activity.        Increase Strength and Stamina Yes       Intervention Provide advice, education, support and counseling about physical activity/exercise needs.;Develop an individualized exercise prescription for aerobic and resistive training based on initial evaluation findings, risk stratification, comorbidities and participant's personal goals.       Expected Outcomes Short Term: Increase workloads from initial exercise prescription for resistance, speed, and METs.;Short Term: Perform resistance training exercises routinely during rehab and add in resistance training at home;Long Term: Improve cardiorespiratory fitness, muscular endurance and strength as measured by increased METs and functional capacity ( )       Able to understand and use rate of perceived exertion (RPE) scale Yes       Intervention Provide education and explanation on how to use RPE scale  Expected Outcomes Short Term: Able to use RPE daily in rehab to express subjective intensity level;Long Term:  Able to use RPE to guide intensity level when exercising independently       Able to understand and use Dyspnea scale Yes       Intervention Provide education and explanation on how to use Dyspnea scale       Expected Outcomes Short Term: Able to use Dyspnea scale daily in rehab to express subjective sense of shortness of breath during exertion;Long Term: Able to use Dyspnea scale to guide intensity level when exercising independently       Knowledge and understanding of Target Heart Rate Range (THRR) Yes       Intervention Provide education and explanation of THRR including how the numbers were predicted and where they are located for reference       Expected Outcomes Short Term: Able to state/look up THRR;Long Term: Able to use THRR to govern intensity when exercising independently;Short Term: Able to use daily as guideline for intensity in rehab       Understanding of Exercise Prescription Yes       Intervention Provide education,  explanation, and written materials on patient's individual exercise prescription       Expected Outcomes Short Term: Able to explain program exercise prescription;Long Term: Able to explain home exercise prescription to exercise independently          Exercise Goals Re-Evaluation:  Exercise Goals Re-Evaluation     Row Name 11/29/23 9161 12/26/23 1629 01/19/24 1112 02/22/24 1619       Exercise Goal Re-Evaluation   Exercise Goals Review Increase Physical Activity;Able to understand and use Dyspnea scale;Understanding of Exercise Prescription;Increase Strength and Stamina;Knowledge and understanding of Target Heart Rate Range (THRR);Able to understand and use rate of perceived exertion (RPE) scale Increase Physical Activity;Able to understand and use Dyspnea scale;Understanding of Exercise Prescription;Increase Strength and Stamina;Knowledge and understanding of Target Heart Rate Range (THRR);Able to understand and use rate of perceived exertion (RPE) scale Increase Physical Activity;Able to understand and use Dyspnea scale;Understanding of Exercise Prescription;Increase Strength and Stamina;Knowledge and understanding of Target Heart Rate Range (THRR);Able to understand and use rate of perceived exertion (RPE) scale Increase Physical Activity;Able to understand and use Dyspnea scale;Understanding of Exercise Prescription;Increase Strength and Stamina;Knowledge and understanding of Target Heart Rate Range (THRR);Able to understand and use rate of perceived exertion (RPE) scale    Comments Marvin Chandler has completed 3 exercise sessions. He exercises for 15 min on the Nustep and recumbent bike. He averages 2.3 METs at level 2 on the Nustep and 2.7 METs at level 2 on the recumbent bike. He performs the warmup and cooldown standing without limitations. It is too soon to notate any discernable progressions at this time. Will continue to monitor and progress as able. Marvin Chandler has completed 10 exercise sessions. He  exercises for 15 min on the Nustep and recumbent bike. He averages 2.5 METs at level 3 on the Nustep and 3.1 METs at level 3 on the recumbent bike. He performs the warmup and cooldown standing without limitations. Marvin Chandler has increased his level on the Nustep and recumbent bike as METs have increased. He tolerates progressions well. Will continue to monitor and progress as able. Marvin Chandler has completed 15 exercise sessions. He exercises for 15 min on the Nustep and recumbent bike. He averages 3.2 METs at level 4 on the Nustep and 3.5 METs at level 4 on the recumbent bike. He performs the warmup and cooldown standing  without limitations. Marvin Chandler continue to increase his level on the Nustep and recumbent bike. METs have greatly increased on the Nustep. He has had some limitation due to not feeling well enough to exercise. Will continue to monitor and progress as able. Marvin Chandler has completed 25 exercise sessions. His peak METs 3.4 on the Nustep a d 4.1 METs on the recumbent bike. He plans to continue exercise at a local gym.    Expected Outcomes Through exercise at rehab and home, the patient will decrease shortness of breath with daily activities and feel confident in carrying out an exercise regimen at home. Through exercise at rehab and home, the patient will decrease shortness of breath with daily activities and feel confident in carrying out an exercise regimen at home. Through exercise at rehab and home, the patient will decrease shortness of breath with daily activities and feel confident in carrying out an exercise regimen at home. Through exercise at rehab and home, the patient will decrease shortness of breath with daily activities and feel confident in carrying out an exercise regimen at home.       Nutrition & Weight - Outcomes:    Nutrition:  Nutrition Therapy & Goals - 12/21/23 1145       Nutrition Therapy   Diet Heart Healthy Diet    Drug/Food Interactions Statins/Certain Fruits      Personal  Nutrition Goals   Nutrition Goal Patient to improve diet quality by using the plate method as a guide for meal planning to include lean protein/plant protein, fruits, vegetables, whole grains, nonfat dairy as part of a well-balanced diet.    Comments Goals in progress. Marvin Chandler has medical history of DM2(metformin, jardiance), PAF, HTN, cardiomyopathy, CHF(lasix , entresto ), OSA, COPD2. He reports eating 1-2 meals per day and multiple snacks. He often eats out with his wife but is mindful of sodium intake and vegetable intake. He weighs ~3x/week and checks blood sugar a few times per week as well. LDL is well controlled. A1c is slightly above goal. He has maintained his weight since starting with our program. Patient will benefit from participation in pulmonary rehab for nutrition education, exercise, and lifestyle modification.      Intervention Plan   Intervention Prescribe, educate and counsel regarding individualized specific dietary modifications aiming towards targeted core components such as weight, hypertension, lipid management, diabetes, heart failure and other comorbidities.;Nutrition handout(s) given to patient.    Expected Outcomes Short Term Goal: Understand basic principles of dietary content, such as calories, fat, sodium, cholesterol and nutrients.;Long Term Goal: Adherence to prescribed nutrition plan.          Nutrition Discharge:   Education Questionnaire Score:  Knowledge Questionnaire Score - 02/08/24 1525       Knowledge Questionnaire Score   Post Score 17/18          Goals reviewed with patient; copy given to patient.

## 2024-03-04 ENCOUNTER — Encounter: Payer: Self-pay | Admitting: Radiology

## 2024-04-03 ENCOUNTER — Ambulatory Visit

## 2024-04-03 VITALS — BP 113/70 | HR 88 | Ht 71.0 in | Wt 224.0 lb

## 2024-04-03 DIAGNOSIS — J4540 Moderate persistent asthma, uncomplicated: Secondary | ICD-10-CM

## 2024-04-03 DIAGNOSIS — J432 Centrilobular emphysema: Secondary | ICD-10-CM | POA: Diagnosis not present

## 2024-04-03 DIAGNOSIS — G4733 Obstructive sleep apnea (adult) (pediatric): Secondary | ICD-10-CM

## 2024-04-03 MED ORDER — FLUTICASONE-SALMETEROL 500-50 MCG/ACT IN AEPB: 1.0000 | INHALATION_SPRAY | Freq: Two times a day (BID) | RESPIRATORY_TRACT | 5 refills | Status: AC

## 2024-04-03 NOTE — Progress Notes (Signed)
 Pulmonology Office Visit   Subjective:  Patient ID: Marvin Chandler, male    DOB: October 28, 1950  MRN: [id number]  Referred by: Tor Jereld DELENA, MD  CC:  Chief Complaint  Patient presents with   Establish Care    HPI JHOVANI Chandler is a 73 y.o. male ex-smoker with asthma, OSA, CHF, CAD, diaphragmatic hernia, A-fib presents for evaluation of OSA.  Respective notes from provider reviewed as appropriate to gather relevant information for patient care.   Discussed the use of AI scribe software for clinical note transcription with the patient, who gave verbal consent to proceed.  History of Present Illness   Marvin Chandler is a 73 year old male with COPD and asthma who presents for OSA and asthma management.  He was diagnosed with obstructive sleep apnea in 2022 and began using a CPAP machine two weeks ago. He uses the CPAP for an average of 3.7 hours per night, with an apnea-hypopnea index (AHI) of 6.1. He experiences frequent awakenings, approximately four times per night, primarily to use the bathroom. He notes improved energy levels since starting CPAP therapy.  He has a history of COPD and asthma, diagnosed by the [facility]. He experiences shortness of breath after walking half a block and has a persistent cough, particularly at night, with light greenish sputum. He uses a nebulizer once daily and an albuterol  inhaler as needed. He is on Advair, taking two puffs twice a day. Cold weather and pollen exacerbate his symptoms.  He quit smoking over 20 years ago after smoking two packs a day for five years. 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 He has pets, including dogs, cats, and a turtle. He is up to date with his flu, COVID, and pneumonia vaccines.       ASTHMA:  High IgE in 700s.  Was on Spiriva  and Breo and Singulair . 01/2015>591. +ve allergen cat, dog, grass, ragweed, house dust, rye. Eos 100.  First diagnosed: Dx in his 50s.  FH:  no Triggers: change in season (cold), pollen, strong smell,  Intubated: none.  Last steroid use: unsure.  Times albuterol  used: once/dau nebs. Rarely use albu inh.  Treatment: advair HFA and spiriva .   OSA history: Dx in 2022>got machine last year>started using it 2 weeks ago   Lung Health: Functional status: 1/2 block.  Covid vaccine: utd Influenza vaccine: utd.  Pneumonococcal vaccine: utd.  Smoking: 2 pk x 5 yrs quit 20 years ago.  Occupational exposure/pets: dog and cats and a turtle. 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 Fume exposure. No mask usage.   PAP download compliance data: Through [facility] [employer]. Do not have compliance data.  Last night 3.5 hrs usage w residual AHI of 2.5.  Last 2 weeks AHI 6.1 with usage around 3.7 hrs.    PRIOR TESTS and IMAGING: PSG/HSAT: Split-night 07/16/2020: Mild OSA with AHI 10.1, O2 nadir 84, REM predominant, 2.7 minutes of desaturation.  PFT 08/2014 moderate obstruction with positive bronchodilator response.  Borderline restriction with TLC 80% predicted. normal DLCO.   Echo June 2022: 50% EF.  CT chest July 2019>reviewed by me w emphysema+bronchitis with some bibasilar scarring/atelectasis and scarring in RML.      04/03/2024    9:00 AM  Results of the [facility] flowsheet  Sitting and reading 1  Watching TV 3  Sitting, inactive in a public place (e.g. a theatre or a meeting) 0  As  a passenger in a car for an hour without a break 1  Lying down to rest in the afternoon when circumstances permit 2  Sitting and talking to someone 0  Sitting quietly after a lunch without alcohol 2  In a car, while stopped for a few minutes in traffic 1  Total score 10    Allergies: Patient has no known allergies.  Current Outpatient Medications:    albuterol  (PROVENTIL ) (2.5 MG/3ML) 0.083% nebulizer solution, Take 3 mLs (2.5 mg total) by nebulization every 6 (six) hours as needed for wheezing or shortness of breath., Disp: 300  mL, Rfl: 5   albuterol  (VENTOLIN  HFA) 108 (90 Base) MCG/ACT inhaler, Inhale 2 puffs into the lungs every 4 (four) hours as needed., Disp: 18 g, Rfl: 6   atorvastatin  (LIPITOR ) 80 MG tablet, Take 80 mg by mouth at bedtime., Disp: , Rfl:    empagliflozin (JARDIANCE) 25 MG TABS tablet, Take 12.5 mg by mouth daily. Take one half tablet daily, Disp: , Rfl:    EPINEPHrine  0.3 mg/0.3 mL IJ SOAJ injection, Inject 0.3 mLs (0.3 mg total) into the muscle once., Disp: 1 Device, Rfl: 11   fluticasone  (FLONASE ) 50 MCG/ACT nasal spray, Place 2 sprays into both nostrils daily., Disp: 16 g, Rfl: 5   fluticasone -salmeterol (ADVAIR DISKUS) 500-50 MCG/ACT AEPB, Inhale 1 puff into the lungs in the morning and at bedtime., Disp: 1 each, Rfl: 5   furosemide  (LASIX ) 40 MG tablet, Take 40 mg by mouth 2 (two) times daily with a meal., Disp: , Rfl:    gabapentin (NEURONTIN) 300 MG capsule, Take 300 mg by mouth at bedtime., Disp: , Rfl:    isosorbide  mononitrate (IMDUR ) 30 MG 24 hr tablet, Take 1 tablet (30 mg total) by mouth daily., Disp: 30 tablet, Rfl: 2   loratadine  (CLARITIN ) 10 MG tablet, Take 10 mg by mouth daily., Disp: , Rfl:    metFORMIN (GLUCOPHAGE) 500 MG tablet, Take 500 mg by mouth daily with breakfast., Disp: , Rfl:    montelukast  (SINGULAIR ) 10 MG tablet, TAKE 1 TABLET BY MOUTH AT BEDTIME, Disp: 30 tablet, Rfl: 30   nitroGLYCERIN  (NITROSTAT ) 0.4 MG SL tablet, Place 1 tablet (0.4 mg total) under the tongue every 5 (five) minutes as needed., Disp: 25 tablet, Rfl: 3   omeprazole  (PRILOSEC) 20 MG capsule, Take 40 mg by mouth daily., Disp: , Rfl:    polyvinyl alcohol (LIQUIFILM TEARS) 1.4 % ophthalmic solution, Place 1 drop into both eyes 4 (four) times daily as needed for dry eyes., Disp: , Rfl:    rivaroxaban  (XARELTO ) 20 MG TABS tablet, Take 1 tablet (20 mg total) by mouth daily with supper., Disp: 30 tablet, Rfl: 6   sacubitril -valsartan  (ENTRESTO ) 97-103 MG, Take 1 tablet by mouth 2 (two) times daily., Disp: 60  tablet, Rfl: 11   spironolactone  (ALDACTONE ) 25 MG tablet, Take 0.5 tablets (12.5 mg total) by mouth daily., Disp: 15 tablet, Rfl: 2   terbinafine (LAMISIL) 1 % cream, Apply 1 application topically daily as needed (athelete's foot and nail fungus)., Disp: , Rfl:    terbinafine (LAMISIL) 250 MG tablet, Take 250 mg by mouth daily., Disp: , Rfl:  Past Medical History:  Diagnosis Date   Arthritis of hip    bilateral   Asthma    CAD (coronary artery disease)    LHC 5/16:  Dx ostial 30%, LAD luminal irregs, EF 40%   Chronic atrial fibrillation (HCC) 07/29/2019   Chronic systolic CHF (congestive heart failure) (HCC)  a. Echo 3/16:  inf and inf-lat HK, mild LVH, EF 45%, mild to mod LAE, normal RVF   Cough    Hiatal hernia    NICM (nonischemic cardiomyopathy) (HCC)    Osteoarthritis    Personal history of colonic adenomas 02/20/2013   Pneumonia    Past Surgical History:  Procedure Laterality Date   ATRIAL FIBRILLATION ABLATION N/A 02/16/2021   Procedure: ATRIAL FIBRILLATION ABLATION;  Surgeon: Inocencio Soyla Lunger, MD;  Location: [facility];  Service: Cardiovascular;  Laterality: N/A;   COLONOSCOPY  02/13/2013   FEMORAL ARTERY STENT Left 03/17/2022   LEFT AND RIGHT HEART CATHETERIZATION WITH CORONARY ANGIOGRAM N/A 08/18/2014   Procedure: LEFT AND RIGHT HEART CATHETERIZATION WITH CORONARY ANGIOGRAM;  Surgeon: Candyce GORMAN Reek, MD;  Location: [facility] CATH LAB;  Service: Cardiovascular;  Laterality: N/A;   LEFT HEART CATH AND CORONARY ANGIOGRAPHY N/A 11/09/2017   Procedure: LEFT HEART CATH AND CORONARY ANGIOGRAPHY;  Surgeon: Court Dorn PARAS, MD;  Location: [facility] INVASIVE CV LAB;  Service: Cardiovascular;  Laterality: N/A;   POPLITEAL ARTERY STENT Right 04/07/2022   TOTAL HIP ARTHROPLASTY  05/02/2000   bilateral   Family History  Problem Relation Age of Onset   Dementia Mother    Cancer Other    Stroke Cousin    Cancer Maternal Aunt    Diabetes Cousin    Colon cancer Neg Hx     Esophageal cancer Neg Hx    Heart attack Neg Hx    Hypertension Neg Hx    Social History   Socioeconomic History   Marital status: Married    Spouse name: Not on file   Number of children: 2   Years of education: Not on file   Highest education level: Some college, no degree  Occupational History   Occupation: Lobbyist: [employer]  Tobacco Use   Smoking status: Former    Current packs/day: 0.00    Average packs/day: 1 pack/day for 7.0 years (7.0 ttl pk-yrs)    Types: Cigarettes    Start date: 11/06/1990    Quit date: 11/05/1997    Years since quitting: 26.4   Smokeless tobacco: Never  Vaping Use   Vaping status: Never Used  Substance and Sexual Activity   Alcohol use: No    Alcohol/week: 0.0 standard drinks of alcohol   Drug use: No   Sexual activity: Not on file  Other Topics Concern   Not on file  Social History Narrative   Lives with his wife   Social Drivers of Corporate Investment Banker Strain: Not on file  Food Insecurity: No Food Insecurity (04/05/2023)   [facility] from [facility]   Hunger Vital Sign    Within the past 12 months, you worried that your food would run out before you got the money to buy more.: Never true    Within the past 12 months, the food you bought just didn't last and you didn't have money to get more.: Never true  Transportation Needs: No Transportation Needs (04/06/2023)   Received from [facility] - Transportation    Lack of Transportation (Medical): No    Lack of Transportation (Non-Medical): No  Physical Activity: Not on file  Stress: No Stress Concern Present (04/05/2023)   Received from [facility] - Occupational Stress Questionnaire    Feeling of Stress : Not at all  Social Connections: Not on file  Intimate Partner Violence: Not At Risk (04/05/2023)   [facility] from [facility]   HITS    Over the last 12 months how often did  your partner physically hurt you?: Never    Over the last 12 months how often did your partner insult you or talk down to you?: Never    Over the last 12 months how often did your partner threaten you with physical harm?: Never    Over the last 12 months how often did your partner scream or curse at you?: Never       Objective:  BP 113/70   Pulse 88   Ht 5' 11 (1.803 m)   Wt 224 lb (101.6 kg)   SpO2 97%   BMI 31.24 kg/m  BMI Readings from Last 3 Encounters:  04/03/24 31.24 kg/m  02/13/24 31.02 kg/m  01/30/24 31.02 kg/m    Physical Exam: Physical Exam   ENT: Normal mucosa. No hypertrophy of inferior turbinates. Tonsils are normal sized. Modified Mallampati score is normal. Oral cavity normal. PULMONARY: Lungs clear to auscultation bilaterally, no adventitious breath sounds. CARDIOVASCULAR: Regular rate and rhythm, S1 S2 normal, no murmurs. ABDOMEN: Abdomen soft, nontender. Bowel sounds are normal. EXTREMITIES: No peripheral edema noted.       Diagnostic Review:  Last metabolic panel Lab Results  Component Value Date   GLUCOSE 127 (H) 04/09/2021   NA 139 04/09/2021   K 3.6 04/09/2021   CL 104 04/09/2021   CO2 29 04/09/2021   BUN 11 04/09/2021   CREATININE 1.41 (H) 04/09/2021   GFRNONAA 53 (L) 04/09/2021   CALCIUM  8.9 04/09/2021   PROT 6.5 09/25/2020   ALBUMIN 4.3 09/25/2020   LABGLOB 2.2 09/25/2020   AGRATIO 2.0 09/25/2020   BILITOT 0.5 09/25/2020   ALKPHOS 96 09/25/2020   AST 19 09/25/2020   ALT 17 09/25/2020   ANIONGAP 6 04/09/2021         Assessment & Plan:   Assessment & Plan OSA (obstructive sleep apnea)  Orders:   Ambulatory Referral for DME  Moderate persistent asthma without complication Centrilobular emphysema (HCC)         Assessment and Plan Unclear if the CPAP is working well or not. Do not have compliance data. We will set up with new DME if [facility] benefits allow.  Will try to get Copy of PFT.     Obstructive sleep apnea CPAP  initiated with partial adherence. Average hourly rate of CPAP use is 3.7 hours per night. Improved energy noted. Still waking up twice/night. I do not have compliance data.  - unsure if complete resolution of AHI when CPAP being used.  - Ordered new CPAP mask supplies through insurance. - Encouraged consistent CPAP use throughout the night.  Chronic obstructive pulmonary disease and asthma COPD and asthma managed with inhalers and nebulizer. Persistent nocturnal cough and exertional dyspnea. No recent exacerbations or hospitalizations. - Changed Advair to maximum dose. Cont spiriva  - Ordered new inhaler supplies. - Requested lung function test records from [facility]. - Scheduled follow-up in three months.      Return in about 3 months (around 07/02/2024).   I personally spent a total of 30 minutes in the care of the patient today including preparing to see the patient, getting/reviewing separately obtained history, performing a medically appropriate exam/evaluation, counseling and educating, placing orders, documenting clinical information in the EHR, independently interpreting results, and communicating results.   Terrilee Dudzik, MD

## 2024-04-03 NOTE — Assessment & Plan Note (Deleted)
 Marvin Chandler

## 2024-04-03 NOTE — Assessment & Plan Note (Addendum)
  Orders:   Ambulatory Referral for DME

## 2024-04-03 NOTE — Assessment & Plan Note (Deleted)
 SABRA

## 2024-04-03 NOTE — Patient Instructions (Signed)
  VISIT SUMMARY:  Today, you came in for a follow-up on your obstructive sleep apnea (OSA) and asthma management. We discussed your current treatment and made some adjustments to help improve your symptoms and overall health.  YOUR PLAN:  -OBSTRUCTIVE SLEEP APNEA (OSA): Obstructive sleep apnea is a condition where your breathing stops and starts repeatedly during sleep. You have started using a CPAP machine, which helps keep your airway open. You are using it for an average of 3.7 hours per night and have noticed improved energy levels. We ordered new CPAP mask supplies through your insurance and encouraged you to use the CPAP consistently throughout the night to get the most benefit.  -CHRONIC OBSTRUCTIVE PULMONARY DISEASE (COPD) AND ASTHMA: COPD and asthma are chronic lung conditions that cause breathing difficulties. You have been managing these with inhalers and a nebulizer. You experience shortness of breath after walking half a block and have a persistent cough, especially at night. We have increased your Advair to the maximum dose (1 puff twice daily) and ordered new inhaler supplies. We also requested your lung function test records from the [facility] and scheduled a follow-up appointment in three months.  INSTRUCTIONS:  Please continue using your CPAP machine every night and try to increase the duration of use. Use your new inhaler supplies as directed and monitor your symptoms. We will review your lung function test records from the [facility] at your next appointment. Your follow-up appointment is scheduled in three months.                      Contains text generated by Abridge.                                 Contains text generated by Abridge.

## 2024-06-04 ENCOUNTER — Telehealth: Payer: Self-pay | Admitting: *Deleted

## 2024-06-04 NOTE — Telephone Encounter (Signed)
 Copied from CRM [id number]. Topic: Clinical - Request for Lab/Test Order >> Jun 03, 2024  2:01 PM Rilla B wrote: Reason for CRM: [facility] calling. Patient's referral was for [facility] and sleep study. States patient has been seen and this has not happened and need to understand why?  States it was not for a consultation, but specific for Inspire Evaluation and baseline polysomnography.  Please call  [phone] * Note:  Caller disconnected when I asked her name   Called and [facility] for [place] with Martin

## 2024-06-05 NOTE — Telephone Encounter (Signed)
 Corean from [facility] is calling back again today to speak with Sonny.  Please return her call at [phone], ext [phone] .  Thanks.

## 2024-06-06 NOTE — Telephone Encounter (Signed)
 [facility] can you assist with this?

## 2024-06-06 NOTE — Telephone Encounter (Signed)
 Calling [facility]

## 2024-06-07 NOTE — Telephone Encounter (Signed)
 Spoke with Delta at the [facility].  Corean states authorization/referral sent to our office stated that pt needed an inspire consult appt and order for baseline polysomnogram. Corean and the pulmonolgist is upset and confused to as why pt was seen for a CPAP office visit since he is being followed at the [facility] for this.  The reasoning for inspire is because the pt has trouble tolerating CPAP.  Routing to admin to see where scheduling got mixed up and routing to Dr. Pawar to advise regarding Inspire and baseline polysomnogram test.

## 2024-06-07 NOTE — Telephone Encounter (Signed)
 I called and spoke with the Marvin Chandler and scheduled him 2/11 with Dr. Pawar for inspire evaluation.  Marvin Chandler cannot tolerate CPAP.   Marvin Chandler and [facility] would also like you to order baseline polysomnogram test. I advised this would be discussed at ov.

## 2024-06-12 ENCOUNTER — Ambulatory Visit

## 2024-07-02 ENCOUNTER — Ambulatory Visit
